# Patient Record
Sex: Female | Born: 1965 | Race: White | Hispanic: No | Marital: Married | Smoking: Never smoker
Health system: Southern US, Community
[De-identification: ages and names within clinical notes are randomized; demographics above are authoritative.]

## PROBLEM LIST (undated history)

## (undated) ENCOUNTER — Emergency Department: Payer: 59

## (undated) ENCOUNTER — Emergency Department: Admission: EM | Source: Home / Self Care

## (undated) DIAGNOSIS — IMO0002 Reserved for concepts with insufficient information to code with codable children: Secondary | ICD-10-CM

## (undated) DIAGNOSIS — A3790 Whooping cough, unspecified species without pneumonia: Secondary | ICD-10-CM

## (undated) DIAGNOSIS — C801 Malignant (primary) neoplasm, unspecified: Secondary | ICD-10-CM

## (undated) DIAGNOSIS — B009 Herpesviral infection, unspecified: Secondary | ICD-10-CM

## (undated) DIAGNOSIS — E079 Disorder of thyroid, unspecified: Secondary | ICD-10-CM

## (undated) DIAGNOSIS — F319 Bipolar disorder, unspecified: Secondary | ICD-10-CM

## (undated) DIAGNOSIS — N939 Abnormal uterine and vaginal bleeding, unspecified: Secondary | ICD-10-CM

## (undated) DIAGNOSIS — F32A Depression, unspecified: Secondary | ICD-10-CM

## (undated) DIAGNOSIS — F909 Attention-deficit hyperactivity disorder, unspecified type: Secondary | ICD-10-CM

## (undated) DIAGNOSIS — T7840XA Allergy, unspecified, initial encounter: Secondary | ICD-10-CM

## (undated) DIAGNOSIS — Z87440 Personal history of urinary (tract) infections: Secondary | ICD-10-CM

## (undated) DIAGNOSIS — F419 Anxiety disorder, unspecified: Secondary | ICD-10-CM

## (undated) DIAGNOSIS — F329 Major depressive disorder, single episode, unspecified: Secondary | ICD-10-CM

## (undated) DIAGNOSIS — R569 Unspecified convulsions: Secondary | ICD-10-CM

## (undated) HISTORY — PX: TUBAL LIGATION: SHX77

## (undated) HISTORY — DX: Herpesviral infection, unspecified: B00.9

## (undated) HISTORY — DX: Personal history of urinary (tract) infections: Z87.440

## (undated) HISTORY — DX: Disorder of thyroid, unspecified: E07.9

## (undated) HISTORY — PX: CERVICAL SPINE SURGERY: SHX589

## (undated) HISTORY — PX: TONSILLECTOMY: SUR1361

## (undated) HISTORY — DX: Bipolar disorder, unspecified: F31.9

## (undated) HISTORY — DX: Attention-deficit hyperactivity disorder, unspecified type: F90.9

## (undated) HISTORY — PX: ABDOMINAL HYSTERECTOMY: SHX81

## (undated) HISTORY — DX: Allergy, unspecified, initial encounter: T78.40XA

## (undated) HISTORY — DX: Abnormal uterine and vaginal bleeding, unspecified: N93.9

---

## 1998-12-13 ENCOUNTER — Emergency Department (HOSPITAL_COMMUNITY): Admission: EM | Admit: 1998-12-13 | Discharge: 1998-12-13 | Payer: Self-pay | Admitting: Emergency Medicine

## 1998-12-24 ENCOUNTER — Encounter: Admission: RE | Admit: 1998-12-24 | Discharge: 1999-03-24 | Payer: Self-pay | Admitting: Orthopedic Surgery

## 1999-03-01 ENCOUNTER — Emergency Department (HOSPITAL_COMMUNITY): Admission: EM | Admit: 1999-03-01 | Discharge: 1999-03-01 | Payer: Self-pay | Admitting: Emergency Medicine

## 2000-02-25 ENCOUNTER — Encounter: Admission: RE | Admit: 2000-02-25 | Discharge: 2000-03-30 | Payer: Self-pay | Admitting: Family Medicine

## 2000-03-01 ENCOUNTER — Emergency Department (HOSPITAL_COMMUNITY): Admission: EM | Admit: 2000-03-01 | Discharge: 2000-03-01 | Payer: Self-pay | Admitting: Emergency Medicine

## 2000-03-16 ENCOUNTER — Ambulatory Visit (HOSPITAL_COMMUNITY): Admission: RE | Admit: 2000-03-16 | Discharge: 2000-03-16 | Payer: Self-pay | Admitting: Neurosurgery

## 2000-03-16 ENCOUNTER — Encounter: Payer: Self-pay | Admitting: Neurosurgery

## 2000-03-25 ENCOUNTER — Encounter: Payer: Self-pay | Admitting: Emergency Medicine

## 2000-03-25 ENCOUNTER — Emergency Department (HOSPITAL_COMMUNITY): Admission: EM | Admit: 2000-03-25 | Discharge: 2000-03-25 | Payer: Self-pay | Admitting: Emergency Medicine

## 2000-04-15 ENCOUNTER — Encounter: Payer: Self-pay | Admitting: Neurosurgery

## 2000-04-16 ENCOUNTER — Inpatient Hospital Stay (HOSPITAL_COMMUNITY): Admission: RE | Admit: 2000-04-16 | Discharge: 2000-04-17 | Payer: Self-pay | Admitting: Neurosurgery

## 2000-04-16 ENCOUNTER — Encounter: Payer: Self-pay | Admitting: Neurosurgery

## 2000-05-12 ENCOUNTER — Encounter: Admission: RE | Admit: 2000-05-12 | Discharge: 2000-05-12 | Payer: Self-pay | Admitting: Neurosurgery

## 2000-05-12 ENCOUNTER — Encounter: Payer: Self-pay | Admitting: Neurosurgery

## 2000-06-24 ENCOUNTER — Encounter: Payer: Self-pay | Admitting: Neurosurgery

## 2000-06-24 ENCOUNTER — Encounter: Admission: RE | Admit: 2000-06-24 | Discharge: 2000-06-24 | Payer: Self-pay | Admitting: Neurosurgery

## 2001-05-05 ENCOUNTER — Encounter: Payer: Self-pay | Admitting: General Practice

## 2001-05-05 ENCOUNTER — Encounter: Admission: RE | Admit: 2001-05-05 | Discharge: 2001-05-05 | Payer: Self-pay | Admitting: General Practice

## 2001-05-06 ENCOUNTER — Encounter: Admission: RE | Admit: 2001-05-06 | Discharge: 2001-05-06 | Payer: Self-pay | Admitting: General Practice

## 2001-05-06 ENCOUNTER — Encounter: Payer: Self-pay | Admitting: General Practice

## 2001-06-05 ENCOUNTER — Emergency Department (HOSPITAL_COMMUNITY): Admission: EM | Admit: 2001-06-05 | Discharge: 2001-06-05 | Payer: Self-pay | Admitting: Emergency Medicine

## 2001-11-28 ENCOUNTER — Encounter: Admission: RE | Admit: 2001-11-28 | Discharge: 2001-11-28 | Payer: Self-pay | Admitting: Emergency Medicine

## 2001-11-28 ENCOUNTER — Encounter: Payer: Self-pay | Admitting: Emergency Medicine

## 2001-12-17 ENCOUNTER — Emergency Department (HOSPITAL_COMMUNITY): Admission: AC | Admit: 2001-12-17 | Discharge: 2001-12-17 | Payer: Self-pay

## 2001-12-17 ENCOUNTER — Encounter: Payer: Self-pay | Admitting: Emergency Medicine

## 2002-05-30 ENCOUNTER — Ambulatory Visit (HOSPITAL_COMMUNITY): Admission: RE | Admit: 2002-05-30 | Discharge: 2002-05-30 | Payer: Self-pay | Admitting: Obstetrics and Gynecology

## 2002-05-30 ENCOUNTER — Encounter: Payer: Self-pay | Admitting: Obstetrics and Gynecology

## 2002-05-31 ENCOUNTER — Inpatient Hospital Stay (HOSPITAL_COMMUNITY): Admission: AD | Admit: 2002-05-31 | Discharge: 2002-06-03 | Payer: Self-pay | Admitting: Obstetrics and Gynecology

## 2002-07-02 ENCOUNTER — Encounter (INDEPENDENT_AMBULATORY_CARE_PROVIDER_SITE_OTHER): Payer: Self-pay | Admitting: *Deleted

## 2002-07-02 ENCOUNTER — Inpatient Hospital Stay (HOSPITAL_COMMUNITY): Admission: AD | Admit: 2002-07-02 | Discharge: 2002-07-09 | Payer: Self-pay | Admitting: Obstetrics and Gynecology

## 2002-07-07 ENCOUNTER — Encounter: Payer: Self-pay | Admitting: Pediatrics

## 2002-07-10 ENCOUNTER — Encounter (HOSPITAL_COMMUNITY): Admission: RE | Admit: 2002-07-10 | Discharge: 2002-08-09 | Payer: Self-pay | Admitting: Obstetrics and Gynecology

## 2002-08-25 ENCOUNTER — Encounter: Payer: Self-pay | Admitting: Emergency Medicine

## 2002-08-25 ENCOUNTER — Encounter: Admission: RE | Admit: 2002-08-25 | Discharge: 2002-08-25 | Payer: Self-pay | Admitting: Emergency Medicine

## 2002-09-09 ENCOUNTER — Encounter: Admission: RE | Admit: 2002-09-09 | Discharge: 2002-10-09 | Payer: Self-pay | Admitting: Obstetrics and Gynecology

## 2002-09-24 ENCOUNTER — Emergency Department (HOSPITAL_COMMUNITY): Admission: EM | Admit: 2002-09-24 | Discharge: 2002-09-24 | Payer: Self-pay | Admitting: Emergency Medicine

## 2002-09-25 ENCOUNTER — Encounter: Admission: RE | Admit: 2002-09-25 | Discharge: 2002-11-14 | Payer: Self-pay | Admitting: Anesthesiology

## 2002-11-09 ENCOUNTER — Encounter: Admission: RE | Admit: 2002-11-09 | Discharge: 2002-12-09 | Payer: Self-pay | Admitting: Obstetrics and Gynecology

## 2002-11-14 ENCOUNTER — Encounter: Admission: RE | Admit: 2002-11-14 | Discharge: 2003-02-12 | Payer: Self-pay

## 2002-12-10 ENCOUNTER — Encounter: Admission: RE | Admit: 2002-12-10 | Discharge: 2003-01-09 | Payer: Self-pay | Admitting: Obstetrics and Gynecology

## 2003-02-08 ENCOUNTER — Encounter: Admission: RE | Admit: 2003-02-08 | Discharge: 2003-03-10 | Payer: Self-pay | Admitting: Obstetrics and Gynecology

## 2003-04-10 ENCOUNTER — Encounter: Admission: RE | Admit: 2003-04-10 | Discharge: 2003-05-10 | Payer: Self-pay | Admitting: Obstetrics and Gynecology

## 2003-07-19 ENCOUNTER — Encounter: Admission: RE | Admit: 2003-07-19 | Discharge: 2003-09-05 | Payer: Self-pay

## 2003-10-09 ENCOUNTER — Encounter: Admission: RE | Admit: 2003-10-09 | Discharge: 2003-10-09 | Payer: Self-pay | Admitting: Neurosurgery

## 2003-10-29 ENCOUNTER — Encounter: Admission: RE | Admit: 2003-10-29 | Discharge: 2003-10-29 | Payer: Self-pay | Admitting: Neurosurgery

## 2004-01-17 ENCOUNTER — Emergency Department (HOSPITAL_COMMUNITY): Admission: AD | Admit: 2004-01-17 | Discharge: 2004-01-17 | Payer: Self-pay | Admitting: Family Medicine

## 2004-02-12 ENCOUNTER — Encounter: Admission: RE | Admit: 2004-02-12 | Discharge: 2004-02-12 | Payer: Self-pay | Admitting: Neurosurgery

## 2004-03-23 ENCOUNTER — Emergency Department (HOSPITAL_COMMUNITY): Admission: EM | Admit: 2004-03-23 | Discharge: 2004-03-23 | Payer: Self-pay | Admitting: Internal Medicine

## 2004-03-31 ENCOUNTER — Ambulatory Visit (HOSPITAL_COMMUNITY): Admission: RE | Admit: 2004-03-31 | Discharge: 2004-03-31 | Payer: Self-pay | Admitting: Internal Medicine

## 2004-04-05 ENCOUNTER — Emergency Department (HOSPITAL_COMMUNITY): Admission: EM | Admit: 2004-04-05 | Discharge: 2004-04-05 | Payer: Self-pay | Admitting: Emergency Medicine

## 2004-04-23 ENCOUNTER — Inpatient Hospital Stay (HOSPITAL_COMMUNITY): Admission: RE | Admit: 2004-04-23 | Discharge: 2004-04-25 | Payer: Self-pay | Admitting: Neurosurgery

## 2004-06-05 ENCOUNTER — Encounter: Admission: RE | Admit: 2004-06-05 | Discharge: 2004-06-17 | Payer: Self-pay | Admitting: Neurosurgery

## 2004-06-24 ENCOUNTER — Emergency Department (HOSPITAL_COMMUNITY): Admission: EM | Admit: 2004-06-24 | Discharge: 2004-06-25 | Payer: Self-pay | Admitting: Emergency Medicine

## 2004-07-03 ENCOUNTER — Encounter: Admission: RE | Admit: 2004-07-03 | Discharge: 2004-09-03 | Payer: Self-pay | Admitting: Pediatrics

## 2004-09-09 ENCOUNTER — Ambulatory Visit (HOSPITAL_COMMUNITY): Admission: RE | Admit: 2004-09-09 | Discharge: 2004-09-09 | Payer: Self-pay | Admitting: Family Medicine

## 2004-12-02 ENCOUNTER — Ambulatory Visit: Payer: Self-pay | Admitting: Psychiatry

## 2004-12-02 ENCOUNTER — Inpatient Hospital Stay (HOSPITAL_COMMUNITY): Admission: EM | Admit: 2004-12-02 | Discharge: 2004-12-12 | Payer: Self-pay | Admitting: Psychiatry

## 2004-12-09 ENCOUNTER — Encounter: Payer: Self-pay | Admitting: Psychiatry

## 2004-12-23 ENCOUNTER — Inpatient Hospital Stay (HOSPITAL_COMMUNITY): Admission: EM | Admit: 2004-12-23 | Discharge: 2004-12-26 | Payer: Self-pay | Admitting: Emergency Medicine

## 2005-01-08 ENCOUNTER — Ambulatory Visit (HOSPITAL_COMMUNITY): Admission: RE | Admit: 2005-01-08 | Discharge: 2005-01-08 | Payer: Self-pay | Admitting: Family Medicine

## 2005-01-12 ENCOUNTER — Ambulatory Visit: Payer: Self-pay | Admitting: Psychiatry

## 2005-01-12 ENCOUNTER — Inpatient Hospital Stay (HOSPITAL_COMMUNITY): Admission: RE | Admit: 2005-01-12 | Discharge: 2005-01-21 | Payer: Self-pay | Admitting: Psychiatry

## 2005-05-29 ENCOUNTER — Inpatient Hospital Stay (HOSPITAL_COMMUNITY): Admission: RE | Admit: 2005-05-29 | Discharge: 2005-06-05 | Payer: Self-pay | Admitting: Psychiatry

## 2005-05-29 ENCOUNTER — Ambulatory Visit: Payer: Self-pay | Admitting: Psychiatry

## 2006-06-09 ENCOUNTER — Ambulatory Visit (HOSPITAL_COMMUNITY): Admission: RE | Admit: 2006-06-09 | Discharge: 2006-06-09 | Payer: Self-pay | Admitting: Neurosurgery

## 2006-08-17 ENCOUNTER — Inpatient Hospital Stay (HOSPITAL_COMMUNITY): Admission: RE | Admit: 2006-08-17 | Discharge: 2006-08-20 | Payer: Self-pay | Admitting: Neurosurgery

## 2006-12-21 ENCOUNTER — Ambulatory Visit (HOSPITAL_COMMUNITY): Admission: RE | Admit: 2006-12-21 | Discharge: 2006-12-21 | Payer: Self-pay | Admitting: Neurosurgery

## 2007-01-11 ENCOUNTER — Ambulatory Visit: Payer: Self-pay | Admitting: Gastroenterology

## 2007-02-25 ENCOUNTER — Encounter: Admission: RE | Admit: 2007-02-25 | Discharge: 2007-05-26 | Payer: Self-pay | Admitting: Radiology

## 2007-03-08 ENCOUNTER — Ambulatory Visit: Payer: Self-pay | Admitting: Physical Medicine & Rehabilitation

## 2007-03-23 ENCOUNTER — Emergency Department (HOSPITAL_COMMUNITY): Admission: EM | Admit: 2007-03-23 | Discharge: 2007-03-23 | Payer: Self-pay | Admitting: Emergency Medicine

## 2007-04-06 ENCOUNTER — Encounter: Admission: RE | Admit: 2007-04-06 | Discharge: 2007-07-05 | Payer: Self-pay | Admitting: Anesthesiology

## 2007-05-10 ENCOUNTER — Ambulatory Visit: Payer: Self-pay | Admitting: Anesthesiology

## 2007-05-10 ENCOUNTER — Other Ambulatory Visit: Admission: RE | Admit: 2007-05-10 | Discharge: 2007-05-10 | Payer: Self-pay | Admitting: Obstetrics and Gynecology

## 2007-05-11 ENCOUNTER — Ambulatory Visit (HOSPITAL_COMMUNITY): Admission: RE | Admit: 2007-05-11 | Discharge: 2007-05-11 | Payer: Self-pay | Admitting: Obstetrics and Gynecology

## 2007-05-27 ENCOUNTER — Observation Stay (HOSPITAL_COMMUNITY): Admission: EM | Admit: 2007-05-27 | Discharge: 2007-05-31 | Payer: Self-pay | Admitting: *Deleted

## 2007-06-06 ENCOUNTER — Emergency Department (HOSPITAL_COMMUNITY): Admission: EM | Admit: 2007-06-06 | Discharge: 2007-06-06 | Payer: Self-pay | Admitting: Emergency Medicine

## 2007-06-09 ENCOUNTER — Ambulatory Visit: Payer: Self-pay | Admitting: Gastroenterology

## 2007-06-13 ENCOUNTER — Encounter: Admission: RE | Admit: 2007-06-13 | Discharge: 2007-06-13 | Payer: Self-pay | Admitting: Family Medicine

## 2007-06-13 ENCOUNTER — Emergency Department (HOSPITAL_COMMUNITY): Admission: EM | Admit: 2007-06-13 | Discharge: 2007-06-13 | Payer: Self-pay | Admitting: Emergency Medicine

## 2007-06-29 ENCOUNTER — Encounter: Admission: RE | Admit: 2007-06-29 | Discharge: 2007-06-29 | Payer: Self-pay | Admitting: Obstetrics and Gynecology

## 2008-02-10 ENCOUNTER — Inpatient Hospital Stay (HOSPITAL_COMMUNITY): Admission: AD | Admit: 2008-02-10 | Discharge: 2008-02-17 | Payer: Self-pay | Admitting: Psychiatry

## 2008-02-10 ENCOUNTER — Ambulatory Visit: Payer: Self-pay | Admitting: Psychiatry

## 2008-12-13 ENCOUNTER — Encounter: Payer: Self-pay | Admitting: Gynecology

## 2008-12-13 ENCOUNTER — Encounter: Admission: RE | Admit: 2008-12-13 | Discharge: 2008-12-13 | Payer: Self-pay | Admitting: Family Medicine

## 2008-12-13 ENCOUNTER — Ambulatory Visit: Payer: Self-pay | Admitting: Gynecology

## 2008-12-13 ENCOUNTER — Other Ambulatory Visit: Admission: RE | Admit: 2008-12-13 | Discharge: 2008-12-13 | Payer: Self-pay | Admitting: Gynecology

## 2008-12-16 ENCOUNTER — Emergency Department (HOSPITAL_COMMUNITY): Admission: EM | Admit: 2008-12-16 | Discharge: 2008-12-16 | Payer: Self-pay | Admitting: Family Medicine

## 2008-12-19 ENCOUNTER — Encounter: Admission: RE | Admit: 2008-12-19 | Discharge: 2008-12-19 | Payer: Self-pay | Admitting: Family Medicine

## 2009-05-13 ENCOUNTER — Encounter: Payer: Self-pay | Admitting: Emergency Medicine

## 2009-05-13 ENCOUNTER — Emergency Department (HOSPITAL_COMMUNITY): Admission: EM | Admit: 2009-05-13 | Discharge: 2009-05-13 | Payer: Self-pay | Admitting: Emergency Medicine

## 2009-05-14 ENCOUNTER — Inpatient Hospital Stay (HOSPITAL_COMMUNITY): Admission: EM | Admit: 2009-05-14 | Discharge: 2009-05-15 | Payer: Self-pay | Admitting: Neurology

## 2009-07-12 ENCOUNTER — Encounter: Payer: Self-pay | Admitting: Critical Care Medicine

## 2009-07-12 ENCOUNTER — Emergency Department (HOSPITAL_COMMUNITY): Admission: EM | Admit: 2009-07-12 | Discharge: 2009-07-12 | Payer: Self-pay | Admitting: Emergency Medicine

## 2009-07-19 ENCOUNTER — Encounter: Admission: RE | Admit: 2009-07-19 | Discharge: 2009-07-19 | Payer: Self-pay | Admitting: Family Medicine

## 2009-07-24 ENCOUNTER — Telehealth: Payer: Self-pay | Admitting: Critical Care Medicine

## 2009-08-01 DIAGNOSIS — R569 Unspecified convulsions: Secondary | ICD-10-CM | POA: Insufficient documentation

## 2009-08-01 DIAGNOSIS — M949 Disorder of cartilage, unspecified: Secondary | ICD-10-CM

## 2009-08-01 DIAGNOSIS — F502 Bulimia nervosa: Secondary | ICD-10-CM | POA: Insufficient documentation

## 2009-08-01 DIAGNOSIS — M899 Disorder of bone, unspecified: Secondary | ICD-10-CM | POA: Insufficient documentation

## 2009-08-01 DIAGNOSIS — F319 Bipolar disorder, unspecified: Secondary | ICD-10-CM | POA: Insufficient documentation

## 2009-08-01 DIAGNOSIS — H9190 Unspecified hearing loss, unspecified ear: Secondary | ICD-10-CM | POA: Insufficient documentation

## 2009-08-01 DIAGNOSIS — F1020 Alcohol dependence, uncomplicated: Secondary | ICD-10-CM

## 2009-08-02 ENCOUNTER — Ambulatory Visit: Payer: Self-pay | Admitting: Critical Care Medicine

## 2009-08-02 DIAGNOSIS — R05 Cough: Secondary | ICD-10-CM

## 2009-08-02 DIAGNOSIS — IMO0002 Reserved for concepts with insufficient information to code with codable children: Secondary | ICD-10-CM | POA: Insufficient documentation

## 2009-08-02 DIAGNOSIS — K219 Gastro-esophageal reflux disease without esophagitis: Secondary | ICD-10-CM | POA: Insufficient documentation

## 2009-08-16 ENCOUNTER — Encounter: Payer: Self-pay | Admitting: Critical Care Medicine

## 2009-09-13 ENCOUNTER — Ambulatory Visit: Payer: Self-pay | Admitting: Critical Care Medicine

## 2009-09-18 ENCOUNTER — Encounter: Payer: Self-pay | Admitting: Critical Care Medicine

## 2009-09-20 ENCOUNTER — Emergency Department (HOSPITAL_COMMUNITY): Admission: EM | Admit: 2009-09-20 | Discharge: 2009-09-20 | Payer: Self-pay | Admitting: Emergency Medicine

## 2009-10-22 ENCOUNTER — Ambulatory Visit: Payer: Self-pay | Admitting: Critical Care Medicine

## 2009-10-22 DIAGNOSIS — J209 Acute bronchitis, unspecified: Secondary | ICD-10-CM

## 2010-05-15 ENCOUNTER — Emergency Department (HOSPITAL_COMMUNITY): Admission: EM | Admit: 2010-05-15 | Discharge: 2010-05-15 | Payer: Self-pay | Admitting: Emergency Medicine

## 2010-06-19 ENCOUNTER — Emergency Department (HOSPITAL_BASED_OUTPATIENT_CLINIC_OR_DEPARTMENT_OTHER): Admission: EM | Admit: 2010-06-19 | Discharge: 2010-06-19 | Payer: Self-pay | Admitting: Emergency Medicine

## 2010-06-19 ENCOUNTER — Ambulatory Visit: Payer: Self-pay | Admitting: Diagnostic Radiology

## 2010-07-02 ENCOUNTER — Encounter: Admission: RE | Admit: 2010-07-02 | Discharge: 2010-07-02 | Payer: Self-pay | Admitting: *Deleted

## 2010-11-16 ENCOUNTER — Encounter: Payer: Self-pay | Admitting: Family Medicine

## 2011-01-09 LAB — DIFFERENTIAL
Eosinophils Relative: 5 % (ref 0–5)
Lymphocytes Relative: 35 % (ref 12–46)
Lymphs Abs: 1.7 10*3/uL (ref 0.7–4.0)
Monocytes Relative: 6 % (ref 3–12)
Neutrophils Relative %: 53 % (ref 43–77)

## 2011-01-09 LAB — URINALYSIS, ROUTINE W REFLEX MICROSCOPIC
Glucose, UA: NEGATIVE mg/dL
Nitrite: NEGATIVE
Specific Gravity, Urine: 1.015 (ref 1.005–1.030)
pH: 7.5 (ref 5.0–8.0)

## 2011-01-09 LAB — BASIC METABOLIC PANEL
CO2: 29 mEq/L (ref 19–32)
Chloride: 104 mEq/L (ref 96–112)
Creatinine, Ser: 1.1 mg/dL (ref 0.4–1.2)
GFR calc Af Amer: >60 mL/min (ref >60)
Sodium: 139 mEq/L (ref 135–145)

## 2011-01-09 LAB — CBC
Hemoglobin: 12.7 g/dL (ref 12.0–15.0)
MCH: 34.1 pg — ABNORMAL HIGH (ref 26.0–34.0)
MCV: 96.6 fL (ref 78.0–100.0)
Platelets: 211 10*3/uL (ref 150–400)
RBC: 3.71 MIL/uL — ABNORMAL LOW (ref 3.87–5.11)
WBC: 4.9 10*3/uL (ref 4.0–10.5)

## 2011-01-09 LAB — PREGNANCY, URINE: Preg Test, Ur: NEGATIVE

## 2011-01-10 LAB — POCT I-STAT, CHEM 8
Chloride: 109 mEq/L (ref 96–112)
HCT: 35 % — ABNORMAL LOW (ref 36.0–46.0)
Hemoglobin: 11.9 g/dL — ABNORMAL LOW (ref 12.0–15.0)
Potassium: 3.6 mEq/L (ref 3.5–5.1)
Sodium: 141 mEq/L (ref 135–145)

## 2011-02-01 LAB — COMPREHENSIVE METABOLIC PANEL
ALT: 12 U/L (ref 0–35)
Albumin: 3.9 g/dL (ref 3.5–5.2)
Alkaline Phosphatase: 41 U/L (ref 39–117)
Chloride: 107 mEq/L (ref 96–112)
Glucose, Bld: 114 mg/dL — ABNORMAL HIGH (ref 70–99)
Potassium: 4.3 mEq/L (ref 3.5–5.1)
Sodium: 137 mEq/L (ref 135–145)
Total Protein: 6.3 g/dL (ref 6.0–8.3)

## 2011-02-01 LAB — URINALYSIS, ROUTINE W REFLEX MICROSCOPIC
Leukocytes, UA: NEGATIVE
Nitrite: NEGATIVE
Specific Gravity, Urine: 1.024 (ref 1.005–1.030)
Urobilinogen, UA: 0.2 mg/dL (ref 0.0–1.0)
pH: 7.5 (ref 5.0–8.0)

## 2011-02-01 LAB — CBC
Hemoglobin: 12.5 g/dL (ref 12.0–15.0)
RBC: 3.77 MIL/uL — ABNORMAL LOW (ref 3.87–5.11)
RDW: 13.1 % (ref 11.5–15.5)
WBC: 6.4 10*3/uL (ref 4.0–10.5)

## 2011-02-01 LAB — DIFFERENTIAL
Basophils Relative: 0 % (ref 0–1)
Eosinophils Absolute: 0 10*3/uL (ref 0.0–0.7)
Monocytes Absolute: 0.3 10*3/uL (ref 0.1–1.0)
Monocytes Relative: 4 % (ref 3–12)
Neutrophils Relative %: 82 % — ABNORMAL HIGH (ref 43–77)

## 2011-02-01 LAB — ETHANOL: Alcohol, Ethyl (B): <5 mg/dL (ref 0–10)

## 2011-02-01 LAB — URINE MICROSCOPIC-ADD ON

## 2011-02-01 LAB — RAPID URINE DRUG SCREEN, HOSP PERFORMED
Cocaine: NOT DETECTED
Tetrahydrocannabinol: NOT DETECTED

## 2011-02-10 LAB — POCT URINALYSIS DIP (DEVICE)
Ketones, ur: NEGATIVE mg/dL
Nitrite: NEGATIVE
Protein, ur: NEGATIVE mg/dL
pH: 6.5 (ref 5.0–8.0)

## 2011-03-10 NOTE — H&P (Signed)
NAME:  [name], [name] NO.:  [id number]   MEDICAL RECORD NO.:  [id number]          PATIENT TYPE:  AMB   LOCATION:  SDC                           FACILITY:  [facility]   PHYSICIAN:  James A. Ashley Royalty, M.D.DATE OF BIRTH:  November 08, 1965   DATE OF ADMISSION:  DATE OF DISCHARGE:                              HISTORY & PHYSICAL   This is a 45 year old gravida 5, para 4, AB 1, who states a desire for  an attempt at permanent surgical sterilization.   MEDICATIONS:  Depakote, Lamictal.   PAST MEDICAL HISTORY:  1. Degenerative disk disease.  2. Bipolar disorder.   SURGICAL:  Three surgeries on her neck, cesarean section 2003,  tonsillectomy.   ALLERGIES:  NO KNOWN DRUG ALLERGIES.   FAMILY HISTORY:  Positive for cervical cancer.   SOCIAL HISTORY:  The patient has a history of alcohol abuse but is  currently in remission.  Denies use of tobacco.   REVIEW OF SYSTEMS:  Noncontributory.   PHYSICAL EXAMINATION:  GENERAL:  Well-developed, well-nourished,  pleasant black female, no acute distress.  VITAL SIGNS:  Afebrile, vital signs stable.  CHEST:  Lungs are clear.  CARDIAC:  Regular rate and rhythm.  ABDOMEN:  Soft and nontender.  PELVIC:  Please see most recent office evaluation.  GENITALIA:  Within normal limits.  Vagina and cervix are without gross  lesions.  Bimanual examination reveals uterus to be normal size, shape  and contour and no adnexal masses are palpable.   IMPRESSION:  Desire for attempt at permanent surgical sterilization.   PLAN:  Laparoscopic bilateral tubal sterilization procedure.  Risks,  benefits, complications and alternatives were discussed with the  patient.  Permanency and failure of various techniques including but not  limited to bipolar cautery, Falope ring, mini-laparotomy with partial  salpingectomy discussed, etc.  Questions invited and answered.      James A. Ashley Royalty, M.D.  Electronically Signed     JAM/MEDQ  D:  05/11/2007  T:   05/11/2007  Job:  [id number]

## 2011-03-10 NOTE — Op Note (Signed)
NAME:  [name], [name] NO.:  [id number]   MEDICAL RECORD NO.:  [id number]          PATIENT TYPE:  AMB   LOCATION:  SDC                           FACILITY:  [facility]   PHYSICIAN:  James A. Ashley Royalty, M.D.DATE OF BIRTH:  [birth year]   DATE OF PROCEDURE:  05/11/2007  DATE OF DISCHARGE:                               OPERATIVE REPORT   PREOPERATIVE DIAGNOSIS:  Desire for attempted permanent surgical  sterilization.   POSTOPERATIVE DIAGNOSIS:  1. Desire for attempted permanent surgical sterilization.  2. Left ovarian cyst versus cystic follicle.   PROCEDURE:  Laparoscopic tubal sterilization procedure (Falope rings).   SURGEON:  Sylvester Harder, M.D.   ANESTHESIA:  General   ESTIMATED BLOOD LOSS:  Less than 10 mL.   COMPLICATIONS:  None.   PACKS AND DRAINS:  None   PROCEDURE:  The patient was taken to the operating room, placed in the  dorsal supine position.  After general anesthetic was administered she  was placed in the lithotomy position, prepped and draped in usual manner  for abdominal and vaginal surgery.  Posterior weighted retractor was  placed per vagina and the anterior lip of the cervix was grasped with  single-tooth tenaculum.  Jarcho uterine manipulator was placed per  cervix and held in place with a tenaculum.  Bladder was drained with a  red rubber catheter.  Next a 1.2 cm internal umbilical incision was made  in the longitudinal plane.  Veress needle was inserted into the  abdominal cavity.  Its location was verified by instillation of saline  in hanging drop techniques.  Approximately 3 liters CO2 were instilled  into the abdominal cavity to create pneumoperitoneum.  Next the size  10/11 disposable laparoscopic trocar was placed in the abdominal cavity.  Its location was verified by placement laparoscope.  There is no  evidence of any trauma.  Next an 8 mm suprapubic trocar to accommodate  the Falope rings was placed in the abdominal cavity  suprapubically  slightly deviated to the left side to avoid the vasculature noted at  transillumination.  Direct visualization technique was employed for  placement.  The pelvis was thoroughly inspected.  The uterus was normal  size, shape and contour without evidence of any fibroids or  endometriosis.  Right fallopian tube as well as left fallopian tube were  normal size, shape, contour length with luxuriant fimbriae.  The right  ovary was normal size, shape and contour without evidence of any cysts  or endometriosis.  The left ovary was normal in appearance save for  approximately 2 cm apparent ovarian cyst versus cystic follicle.  There  were no surface excrescences.  Remainder of the peritoneal surfaces were  smooth and glistening.   Attention was then turned to the tubal sterilization procedure.  The  right fallopian tube was grasped and distal isthmic to proximal  ampullary portion.  A Falope ring was applied without difficulty.  An  excellent knuckle of tube was noted to be contained within the ring.  Excellent blanching of tissue was noted.  Next the left fallopian tube  was grasped and traced to its fimbriated end.  An avascular area in the  distal isthmic to proximal ampullary portion was chosen for ring  placement.  A Falope ring was applied without difficulty.  An excellent  knuckle of tube was noted to be contained within the ring.  Excellent  blanching of tissue was noted.   At this point the patient was felt to have benefited maximally from the  surgical procedure.  The abdominal instruments were removed.  Pneumoperitoneum evacuated.  Fascial defects were closed with 0 Vicryl  in an interrupted fashion.  The skin was closed with 3-0 Monocryl in  subcuticular fashion.  Approximately 13 mL of 0.5% Marcaine with  1:200,000 epinephrine were instilled into the abdominal incisions to aid  in postoperative analgesia.   The vaginal instruments were removed.  Hemostasis noted  and the  procedure terminated.   The patient tolerated procedure extremely well and was returned to the  recovery room in good condition.      James A. Ashley Royalty, M.D.  Electronically Signed     JAM/MEDQ  D:  05/11/2007  T:  05/11/2007  Job:  [id number]

## 2011-03-10 NOTE — H&P (Signed)
[name], [name] NO.:  [id number]   MEDICAL RECORD NO.:  [id number]          PATIENT TYPE:  OBV   LOCATION:  4731                         FACILITY:  [facility]   PHYSICIAN:  Hollice Espy, M.D.DATE OF BIRTH:  1966/05/31   DATE OF ADMISSION:  05/26/2007  DATE OF DISCHARGE:                              HISTORY & PHYSICAL   PRIMARY CARE PHYSICIAN:  Lucita Ferrara, M.D.   CHIEF COMPLAINT:  Chest discomfort.   HISTORY OF PRESENT ILLNESS:  The patient is a 45 year old white female  with past medical history of degenerative joint disease and bipolar  disorder who presents to the  emergency room after an episode of chest  discomfort.  She has had no previous episode.  Was doing well when all  of a sudden early this morning, she started having what she described as  chest discomfort.  This was described as sudden onset, without any kind  of associated activity.  Described initially as a sharp, stabbing pain  in the lateral side of her left breast and then some mild pressure to  right above it.  She also had some associated shortness of breath, some  lightheadedness as well as a headache and some tingling over her  forehead and underneath her chin as well as some tingling down her left  arm.  Initially her symptoms were mild but then continued to persist.  She was given nitroglycerin and aspirin which she said improved her  symptoms, especially the tingling sensation.  She became obviously  concerned and called the paramedics who had the patient transported.  In  the emergency room, she had a chest x-ray done which was reportedly  unremarkable.  She had blood work done including cardiac markers which  were unremarkable and an EKG which showed normal sinus rhythm.  The  patient was given systemically aspirin, Protonix, morphine, and  nitroglycerin patch. She says the only residual symptoms are some left  arm numbness which is also like a left hand cramping.  Currently  the  patient is doing well.   She denies any headaches or vision changes. She has reported over the  last few days some dysphagia which she says whenever she tries to take  any of her pills, this caused a choking sensation.  She denies any  current chest pain.  No palpitations.  No shortness of breath, wheezing,  coughing.  No abdominal pain.  No hematuria, dysuria, constipation,  diarrhea, focal extremity weakness or pain other than, of course, the  left arm numbness and cramping.  Review of systems otherwise negative.   PAST MEDICAL HISTORY:  1. Degenerative joint disease.  2. Bipolar disorder.   MEDICATIONS:  Lamictal, Depakote, Flexeril, tramadol, and Wellbutrin.   ALLERGIES:  She has no known drug allergies.   SOCIAL HISTORY:  She denies any tobacco, alcohol or drug use.   FAMILY HISTORY:  Negative for any CAD.   PHYSICAL EXAMINATION:  VITAL SIGNS:  On admission, temperature 98.6,  heart rate initially 110, now down to 91.  Blood pressure 120/70, now  down to 97/62.  Respirations 22.  Oxygen saturation 96% on  room air.  GENERAL:  The patient is alert and oriented x3, in no apparent distress.  HEENT:  Normocephalic, atraumatic.  Mucous membranes are moist.  She has  no carotid bruits.  HEART:  Regular rate and rhythm.  S1 and S2.  LUNGS:  Clear to auscultation bilaterally.  ABDOMEN:  Soft, nontender, nondistended.  Positive bowel sounds.  EXTREMITIES:  No clubbing, cyanosis or edema.   LABORATORY DATA:  White count 5.2, hemoglobin 12.6, hematocrit 37.3, MCV  94, platelet count 183,000.  Sodium 138, potassium 4.2, chloride 106,  bicarb 27, BUN 14, creatinine 0.9, glucose 90, LFTs unremarkable.  D-  dimer normal. Coags normal.  Cardiac markers:  CPK 25.5, MB less than 1,  troponin I less than 0.05.  EKG and chest x-ray are per HPI.   ASSESSMENT/PLAN:  1. Chest discomfort.  It is possible this may not be cardiac in nature      given the atypical symptoms.  Would plan to  check two more sets of      cardiac enzymes and also check a swallow evaluation.  If her      symptoms are negative, could possibly set up for an outpatient      stress test.  2. Dysphagia.  Speech therapy to check for swallow evaluation.  3. Bipolar disorder.  Continue Depakote and Lamictal.      Hollice Espy, M.D.  Electronically Signed     SKK/MEDQ  D:  05/27/2007  T:  05/27/2007  Job:  [id number]
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
## 2011-03-10 NOTE — Discharge Summary (Signed)
NAME:  [name], [name] NO.:  [id number]   MEDICAL RECORD NO.:  [id number]          PATIENT TYPE:  EMS   LOCATION:  ED                           FACILITY:  [facility]   PHYSICIAN:  Marlan Palau, M.D.  DATE OF BIRTH:  [birth year]   DATE OF ADMISSION:  05/13/2009  DATE OF DISCHARGE:  05/14/2009                               DISCHARGE SUMMARY   ADMISSION DIAGNOSES:  1. History of seizure disorder with recent recurrence.  2. Bipolar disorder.  3. History of Xanax abuse and suicidal ideation.   DISCHARGE DIAGNOSES:  1. History of seizure disorder with recent recurrence.  2. Headache.  3. Bipolar disorder.   PROCEDURES DONE THIS ADMISSION:  1. CT of the head.  2. MRI of the brain.  3. EEG study.   COMPLICATION OF ABOVE PROCEDURES:  None.   HISTORY OF PRESENT ILLNESS:  [name] is a 45 year old right-  handed white female born on 1966-03-22, with a history of seizures  dating back to 2005.  This patient had some seizures as well in 2008,  but was not treated with medications.  The patient recently has been  placed on Wellbutrin rapidly going up to 150 mg three times daily and  was placed on Lamictal done initially at 25 mg daily and then going to  50 mg a day.  This patient was being treated for bipolar disorder.  The  patient however had 2 seizure events that were generalized that were  noted prior to coming into the emergency room and had a third seizure  after a Dilantin load.  The patient was brought in for observation and  treatment of the seizures therefore.  The patient did have some tongue  biting with the seizures.   PAST MEDICAL HISTORY:  Significant for,  1. History of seizure disorder with recent recurrence.  2. Bipolar disorder.  3. History of Xanax abuse and suicidal ideation.  4. Esophageal strictures.  5. Degenerative arthritis.  6. Bilateral tubal ligation.  7. C-section in the past.  8. Cervical spine surgery on 3 occasions.  9.  History of bulimia.   MEDICATIONS PRIOR TO ADMISSION:  1. Wellbutrin 150 mg three daily.  2. Lamictal 50 mg daily.   The patient does not smoke or drink.   Has no known allergies.   Please refer to history and physical dictation summary for social  history, family history, review of systems, and physical examination.   Laboratory values are notable for a white count of 6.4, hemoglobin 12.5,  hematocrit of 36.3, MCV of 96.4, platelets of 186.  Sodium 137,  potassium 4.3, chloride of 107, CO2 of 25, glucose 114, BUN of 9,  creatinine 0.83, alk phosphatase of 41, SGOT of 19, SGPT of 12, total  protein 6.3, albumin of 3.9, calcium of 9.6.  Urine drug screen was  positive for opiates.  Urinalysis reveals specific gravity of 1.024, pH  of 7.5, 0-2 white cells, otherwise unremarkable.  Alcohol level was less  than 5.   CT of the head was unremarkable.   HOSPITAL COURSE:  This patient has done well during  the course of  hospitalization.  The patient was taken off her Wellbutrin on admission.  The patient was given Dilantin load in the emergency room and was  maintained on 300 mg daily.  The Lamictal was increased to 50 mg twice  daily and plans are to increase this by 50 mg every 2 weeks until she  gets to 100 mg twice daily.  At that point, Dilantin will be slowly  tapered off and Lamictal may be continued to be increased.  Lamictal is  used for treatment of seizures as well as bipolar disorder.  The patient  will not go back on Wellbutrin as it has a significant tendency to lower  seizure thresholds.  The patient has been complaining of headache  associated with some neck stiffness consistent with cervicogenic  headache.  The patient has undergone MRI scan of the brain that is  unremarkable.  EEG study shows a drowsy record, but no epileptiform  discharges.  The patient will follow up in 2 months with Dr. Sharene Skeans.  The patient is not operating a motor vehicle, but is not to drive for  at  least 3 months following this admission.  The patient claims she has not  driven a car in several years.  The patient is not working.   DISCHARGE MEDICATIONS:  1. Lamictal at 50 mg twice daily, go up to 50 mg in the morning and      100 mg in the evening after 2 weeks and then after another 2 weeks,      the patient will go to 100 mg twice daily.  2. Dilantin 300 mg at night.      Marlan Palau, M.D.  Electronically Signed     CKW/MEDQ  D:  05/15/2009  T:  05/15/2009  Job:  [id number]   cc:   [facility]

## 2011-03-10 NOTE — Discharge Summary (Signed)
[name], RITTER           ACCOUNT NO.:  [id number]   MEDICAL RECORD NO.:  [id number]          PATIENT TYPE:  OBV   LOCATION:  4731                         FACILITY:  [facility]   PHYSICIAN:  Kela Millin, M.D.DATE OF BIRTH:  [birth year]   DATE OF ADMISSION:  05/26/2007  DATE OF DISCHARGE:  05/31/2007                               DISCHARGE SUMMARY   CONTINUATION   BRIEF HISTORY:  The patient is a 45 year old white female with the above-  listed medical problems who presented with complaints of chest  discomfort.  She reported that she had been doing well until the morning  of presentation when she had sudden onset of chest pain not associated  with any activity.  She described the pain as sharp, stabbing, and in  the lateral side of her left breast with mild pressure to right above  it.  She admitted to some shortness of breath with lightheadedness,  headache, and some tingling over her left forehead, underneath her chin,  and some tingling down her left arm.  She was given nitroglycerin and  aspirin which she reported improved her symptoms.  In the ER, she had a  chest x-ray done, which was unremarkable, and an EKG showed normal sinus  rhythm.  She was given aspirin, Protonix, morphine, and then put on the  nitroglycerin patch.  The patient denied visual changes. She reported  that she had some dysphagia, mostly with taking her pills which caused a  choking sensation.  She denied focal weakness.   Please see the dictated admission History and Physical per Dr. Rito Ehrlich  for full details of the admission physical exam as well as the  laboratory data.   HOSPITAL COURSE:  #1.  DYSPHAGIA:  Early distal esophageal stricture per EGD.  Upon  admission, the patient was kept n.p.o. and hydrated with IV fluids. She  also was placed on a proton pump inhibitor.  She was subsequently  started on clear liquids, and gastroenterology was consulted, and  [facility] saw the patient.   Initially she had an esophagram done with  results as stated above.  An EGD was subsequently done, and the results  are stated above.  As noted, dilatation was done.  The patient reported  that her swallowing improved following this procedure.  GI advanced her  diet, and she has been tolerating it better.  The patient subsequently  began also complaining of oropharyngeal difficulty with swallowing, and  GI recommended an ENT consult.  Dr. Ezzard Standing, with ENT, was consulted, and  I discussed the patient with him, and he recommended that a modified  barium swallow be done.  This was done, and the results are stated  above. Following this study, speech therapy recommended to continue her  regular diet.  Dr. Ezzard Standing also indicated that he would not need to see  the patient in the hospital but that she is to make an appointment to  follow up with him outpatient.  The patient is tolerating a p.o. diet  and tolerating her pills at this time and will be discharged to follow  up outpatient.   #2.  CHEST PAIN:  Upon admission, the patient had serial cardiac enzymes  done, and was placed on Protonix as noted and also was given aspirin and  nitroglycerin in the ER.  Serial cardiac enzymes were negative.  The  impression is that the chest pain was likely GI related secondary to #1.  She is to follow up with her primary care physician.   #3.  HISTORY OF BIPOLAR DISORDER:  The patient was maintained on her  outpatient medications during her hospital stay.   #4.  HISTORY OF BULIMIA:  Patient to follow up with GI as scheduled upon  discharge.   #5.  HISTORY OF DEGENERATIVE JOINT DISEASE:  The patient is to follow up  at the pain clinic.   DISCHARGE MEDICATIONS:  1. Prilosec 40 mg p.o. daily.  2. Patient to continue Flexeril, Lamictal, Tramadol, Depakote,      Wellbutrin, and Ambien as previously.   FOLLOWUP:  1. Dr. Flonnie Overman in 1 week, patient to call for appointment.  2. Dr. Ezzard Standing, ENT specialist.  Patient to call for appointment upon      discharge.  3. Dr. Jarold Motto, gastroenterologist, as scheduled.   CONDITION ON DISCHARGE:  Improved and stable.      Kela Millin, M.D.  Electronically Signed     ACV/MEDQ  D:  05/31/2007  T:  05/31/2007  Job:  [id number]   cc:   Lucita Ferrara, MD  Kristine Garbe. Ezzard Standing, M.D.  Vania Rea. Jarold Motto, MD, Caleen Essex, FAGA

## 2011-03-10 NOTE — Procedures (Signed)
REQUESTING PHYSICIAN:  C. Lesia Sago, MD   ATTENDING PHYSICIAN:  Marlan Palau, MD   CLINICAL HISTORY:  A 45 year old woman with history of bipolar disorder,  Xanax abuse, and recent Wellbutrin use, with a single seizure in 2005,  now admitted for seizures.  EEG is performed evaluation for seizures.   DESCRIPTION:  The dominant rhythm of this tracing is seen intermittently  in full wake and is a moderate amplitude alpha rhythm of 10-11 Hz which  predominates posteriorly, appears without abnormal asymmetry, and  attenuates with eye-opening and closing.  Throughout the recording,  intermittent spells of drowsiness appear as evidenced by fragmentation  of slowing in the background and appearance of a diffuse low-amplitude  theta state.  As drowsiness progresses, higher amplitude frontal  dominant 4-6 Hz theta waves are seen.  Late in the recording, stage II  sleep is documented as evidenced by the appearance of high-amplitude  vertex waves and some K complexes.  No definite focal abnormalities or  epileptiform discharges are seen.  Photic stimulation produced symmetric  driver responses.  Hyperventilation produced an increase in the high-  amplitude frontal theta.  Single channel devoted to EKG revealed sinus  rhythm throughout with rate of approximately 72 beats minute.   CONCLUSIONS:  Mildly abnormal study due to the presence of intermittent  diffuse slowing with the background rhythms, findings suggestive of  drowsiness, and/or mildly encephalopathic state.  No focal slowing is  noted and no epileptiform discharges are seen.      Michael L. Thad Ranger, M.D.  Electronically Signed     GNF:AOZH  D:  05/14/2009 11:48:48  T:  05/15/2009 05:36:19  Job #:  [id number]

## 2011-03-10 NOTE — H&P (Signed)
NAME:  [name], [name] NO.:  [id number]   MEDICAL RECORD NO.:  [id number]          PATIENT TYPE:  INP   LOCATION:  3018                         FACILITY:  [facility]   PHYSICIAN:  Marlan Palau, M.D.  DATE OF BIRTH:  1965-12-01   DATE OF ADMISSION:  05/13/2009  DATE OF DISCHARGE:                              HISTORY & PHYSICAL   HISTORY OF PRESENT ILLNESS:  [name] is a 45 year old right-  handed white female, born on 1966/10/10, with a history of seizures  dating back to 2005.  This patient had some seizures as well in 2008,  but has not been treated with medications.  The patient apparently was  seen by Dr. Sharene Skeans at that time and underwent an MRI and an EEG study.  The patient has recently been placed on Wellbutrin 150 mg 3 times a day  and is on Lamictal 50 mg daily.  Both these medications were started 3  weeks ago for bipolar disorder.  The patient has a history of Xanax  overuse and suicidal ideation and is actively being followed through  Psychiatry.  The patient has had 2 seizures before coming to the  emergency room and a third seizure after Dilantin load today.  Neurology  was called for further evaluation.  The patient had some tongue biting  with the seizures.  Seizures were generalized in nature.  The patient  reports no focal numbness or weakness of face, arms, or legs, but did  note a headache yesterday and today.  The patient is being admitted for  brief evaluation and management of seizures.   PAST MEDICAL HISTORY:  Significant for:  1. History of seizure disorder with recent recurrence.  2. Bipolar disorders.  3. History of Xanax abuse, suicidal ideation.  4. Esophageal strictures.  5. Degenerative arthritis.  6. Bilateral tubal ligation.  7. C section in the past.  8. Cervical spine surgery on 3 occasions.  9. Bulimia.   MEDICATIONS:  1. Wellbutrin 150 mg 3 times daily.  2. Lamictal at 50 mg daily.   The patient does not  smoke or drink.  Has no known allergies.   SOCIAL HISTORY:  The patient is married, lives in the [place] area.  She is not working and has 4 children who are alive and  well.   FAMILY MEDICAL HISTORY:  Notable for bronchiectasis in the mother with  MRSA infection.  Father is alive and well.  The patient has 1 brother  and 1 sister, both are alive and well.  No family history of seizures as  noted.   REVIEW OF SYSTEMS:  Notable for no recent fevers or chills.  The patient  does note headache yesterday and today.  Denies any visual field  changes, swallowing problems, or neck pain.  The patient denies any  shortness of breath, chest pains, or abdominal pains.  She did have some  numbness in the right leg, right arm within the last couple of weeks.  Notes some imbalance problems.   PHYSICAL EXAMINATION:  VITAL SIGNS:  Blood pressure is 106/66, heart  rate is  96, respiratory rate 18, and temperature afebrile.  GENERAL:  This patient is a fairly well-developed white female who is  alert and cooperative at the time of examination.  HEENT:  Head is atraumatic.  Eyes, pupils are equal, round, and reactive  to light.  Disks are sofa and flat bilaterally.  NECK:  Supple.  No carotid bruits noted.  RESPIRATORY:  Clear.  CARDIOVASCULAR:  Regular rate and rhythm.  No obvious murmurs or rubs  noted.  EXTREMITIES:  Without significant edema.  ABDOMEN:  Positive bowel sounds.  No organomegaly or tenderness is  noted.  NEUROLOGIC:  Cranial nerves as above.  Facial symmetry is present.  The  patient has good sensation of face to pinprick and soft touch  bilaterally.  She has good strength of facial muscles, muscle of head  turning and shoulder shrug bilaterally.  Speech is well enunciated, not  aphasic.  Motor testing reveals good strength in all fours.  Good  symmetric motor tone is noted throughout.  The patient has good finger-  nose-finger and heel-to-shin.  Gait was not  tested.  Deep tendon  reflexes symmetric and normal toes downgoing bilaterally.   LABORATORY VALUES:  Notable for white count of 6.4, hemoglobin of 12.5,  hematocrit of 36.3, MCV of 96.4, and platelets of 186.  Sodium of 137,  potassium of 4.3, chloride of 107, CO2 of 25, glucose of 114, BUN of 9,  creatinine 0.83, calcium 9.6, total protein is 6.3, albumin 3.9, AST of  19, ALT of 12.  Alcohol level less than 5.  Urine drug screen is  negative with the exception of opiates.  Urinalysis reveals specific  gravity 1.024, pH of 7.5, otherwise unremarkable.  CT of the head shows  no acute changes.  CT of the cervical spine was also done.   IMPRESSION:  1. History of seizure disorder with recent recurrence.  2. Bipolar disorder.   This patient will be admitted for brief evaluation and management and  procedures, check an MRI of the brain, and get an EEG study.  The  patient has been loaded with Dilantin.  We will continue Dilantin while  we get the Lamictal dose up to a more therapeutic level.  The patient  will be taken off Wellbutrin which was recently started.  We will follow  patient's clinical course while in-house.  The patient will have seizure  precautions.      Marlan Palau, M.D.  Electronically Signed     CKW/MEDQ  D:  05/13/2009  T:  05/14/2009  Job:  [id number]   cc:   [facility]
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
## 2011-03-10 NOTE — Procedures (Signed)
NAME:  BRETT, SOZA NO.:  [id number]   MEDICAL RECORD NO.:  [id number]          PATIENT TYPE:  REC   LOCATION:  OREH                         FACILITY:  [facility]   PHYSICIAN:  Celene Kras, MD        DATE OF BIRTH:  [birth year]   DATE OF PROCEDURE:  05/10/2007  DATE OF DISCHARGE:                               OPERATIVE REPORT   PATIENT:  [name].   DATE OF BIRTH:  08-30-1966   SURGEON:  Jewel Baize. Stevphen Rochester, M.D.   [facility]ailene [facility]breun [facility] to  evaluate her health and history form, 14 point review of systems.   1. She is an individual who is known to me, and states she has really      cleaned things up.  She has quit drinking, she wants to be as      engaging and as cooperative as possible.  She has been seen by Dr.      Wynn Banker.  Dr. Abigail Miyamoto sent her over here for further evaluation,      complaining of cervical pain.  Bilateral in nature above and below      surgical fixation site, with added biomechanical stress.  Referral      pattern consistent with suprascapular-levator scapular pain,      mechanical in nature.  She does not have any specific radicular      myelopathy component.  It is reasonable to inject cervical facet      medial branch at the intervention site, cervical spine, facet to      minimize escalation of controlled substances and improve function      and quality of life.  2. I am realistic with her, it is 25% to 50% if we can improve her      range of motion and lessen myofascial pain, I think this is      important.  Another rationale is to minimize escalation of      controlled substances, improve function and quality of life,      consider RF with positive predictive experience.   Maintain contact with our rehabilitation colleagues.   Objectively diffuse paracervical myofascial discomfort with positive  cervical facetal compression test right and left.  Suboccipital  compression test  positive; range of motion impaired secondary to pain.  Her pain in the paralumbar position is mostly  myofascial and  mechanical; nothing new neurologically.   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   PLAN:  Cervical facet medial branch inject intervention, C3, C4, C5, C6  and C7 with contributory innervation addressed.  Under local anesthetic,  and she is consented.  Predicate further intervention based on need and  overall response.  Will see her back in a few weeks.  She will assess  within context of activities of daily living and she is consented.   PROCEDURE:  The patient taken to the fluoroscopy suite and placed in the  supine position.  The neck was prepped, draped in usual  fashion.  Using  a 25-gauge needle, I advanced to the cervical facet at the medial branch  of C3, C4, C5, C6, and C7, with contributory innervation addressed.  Right and left side under local anesthetic, independent needle access  points confirmed placement.  I then injected 0.5 mL of lidocaine 1% MPF  at each level, a total of 40 mg Aristocort in divided dose.   Tolerated procedure well.  No complications from our procedure.  Appropriate recovery.  Discharge instructions given.  Will see her in  follow-up.  No barrier to communication.           ______________________________  Celene Kras, MD     HH/MEDQ  D:  05/10/2007 09:49:39  T:  05/10/2007 23:39:31  Job:  [id number]

## 2011-03-10 NOTE — Discharge Summary (Signed)
NAMEPAUL, [name]           ACCOUNT NO.:  [id number]   MEDICAL RECORD NO.:  [id number]          PATIENT TYPE:  OBV   LOCATION:  4731                         FACILITY:  [facility]   PHYSICIAN:  Kela Millin, M.D.DATE OF BIRTH:  08-15-1966   DATE OF ADMISSION:  05/26/2007  DATE OF DISCHARGE:  05/31/2007                               DISCHARGE SUMMARY   DISCHARGE DIAGNOSES:  1. Dysphagia, early distal esophageal stricture per      esophagogastroduodenoscopy.  2. Chest pain, ruled out for myocardial infarction by cardiac enzymes,      likely secondary to gastrointestinal etiology as above.  3. History of bulimia.  4. Bipolar disorder.  5. History of degenerative joint disease.   PROCEDURES AND STUDIES:  1. Esophagogram:  Nonspecific esophageal motility disorder.  2. Esophagogastroduodenoscopy on May 29, 2007:  Early distal      esophageal stricture, dilator passed x1.  3. Modified barium swallow:  Trace vallecula residuals noted with      solids only, which clear with intermittent multiple swallows.  No      penetration or aspiration.   CONSULTATIONS:  Gastroenterology, [place] Molly Maduro D. Arlyce Dice, MD).   HISTORY:  Dictation ended at this point.      Kela Millin, M.D.  Electronically Signed     ACV/MEDQ  D:  05/31/2007  T:  05/31/2007  Job:  [id number]

## 2011-03-13 NOTE — Assessment & Plan Note (Signed)
[facility]                         GASTROENTEROLOGY OFFICE NOTE   NAME:[name], [name]                  MRN:          [id number]  DATE:01/11/2007                            DOB:          1966/02/24    NEW PATIENT EVALUATION:  [name] is a 45 year old white female  currently unemployed who is battling with chronic bulimia.  She goes to  a bulimia support group, but still frequently causes herself to vomit,  and, as result has burning substernal chest pain, coughing, a globus  sensation in her throat, and chronic dry non-productive cough.   In addition to her bulimia, she has chronic discomfort in her neck  related to previous degenerative spine disease and is actually under the  care of Dr. Stevphen Rochester and has had local nerve blocks to her neck.  She  seems to be doing well on Percocet which is apparently dispensed by her  husband.  She additionally has a history of alcohol abuse and  alcoholism, but has been in recovery for 2 years, and apparently attends  AA meetings several times a week.   The patient has not been on antacids or PPIs.  She says that at times  when she vomits she has had had some coffee ground bloody-looking  material in her emesis.  She has no dysphagia, odynophagia or any  hepatobiliary complaints.  She was previously evaluated by Dr.  Abbey Chatters and Dr. Luther Parody from a GI standpoint in August of 2003 when  she was admitted with nausea, vomiting, abdominal pain and some abnormal  liver function tests.  All of these resolved spontaneously, and were  felt to be secondary to alcohol abuse.  She denies any history of  salicylate or NSAID abuse.   PAST MEDICAL HISTORY:  Remarkable for numerous psychiatric problems with  rather severe depression in the past.  She currently has mild  depression, but denies hallucinations, delusions or suicidal thoughts.  She is not on antidepressants.  She does have chronic anxiety  syndrome  and chronic headaches.  She had neck surgery in 2007 and had cervical  fusions by Dr. Jeral Fruit.  She did not have previous abdominal surgery.   FAMILY HISTORY:  Remarkable for cervical cancer in her mother and  several family members.  Her father was an alcoholic.  Otherwise, there  are no known gastrointestinal problems in her family.   SOCIAL HISTORY:  She is married, lives with her husband and has 4  children.  She has a 12th grade education.  She currently is unemployed.  She currently is a non-smoker or using ethanol, and apparently has been  sober for over a year.   REVIEW OF SYSTEMS:  Remarkable for a dry cough, chronic insomnia,  chronic neck and back pain, chronic fatigue, chronic sore throat.  Her  last menstrual period was in September of last year.  She feels that she  may be undergoing menopause.   MEDICATIONS:  1. Percocet several times a day.  2. Valium p.r.n.  3. Tylenol p.r.n.  4. DayQuil p.r.n.   ALLERGIES:  She denies drug allergies.   PHYSICAL EXAMINATION:  GENERAL:  She is a thin but healthy-appearing  white female in no distress who appears her stated age.  I could not  appreciate stigmata of chronic liver disease, thyromegaly or  lymphadenopathy.  VITAL SIGNS:  She is 5 feet 1 inches tall and weighs 113 pounds.  Blood  pressure is 90/52 and pulse of 60 and regular.  CHEST:  Clear, and she was in a regular rhythm without murmurs, gallops,  or rubs.  ABDOMEN:  There was no abdominal distention, organomegaly, masses or  tenderness.  Bowel sounds were active.  I could not appreciate a  succussion splash in the epigastric area.  Abdominal exam otherwise was  unremarkable.  EXTREMITIES:  Extremities were unremarkable.  MENTAL STATUS:  Clear.  RECTAL:  Deferred.   ASSESSMENT:  [name] obviously has bulimia with associated acid  reflux and probably erosive esophagitis.  There is nothing in her  history of suggest gastric outlet obstruction  or recurrent peptic ulcer  disease.  She currently is unemployed and has no insurance, as mentioned  above.   RECOMMENDATIONS:  1. Trial of Zegerid 40 mg twice a day.  This should be the better PPI      combination for her, since she does not eat regularly.  This should      give her a good acid suppressive therapy.  2. Standard anti-reflux maneuvers and patient education may be on acid      reflux this management.  3. I have urged her to see Dr. Jacki Cones for psychiatric consultation.      She may benefit from Paxil administration.  4. GI follow up in several weeks time.  We may need to proceed with      endoscopy, depending on her medical course.     Vania Rea. Jarold Motto, MD, Caleen Essex, FAGA  Electronically Signed    DRP/MedQ  DD: 01/11/2007  DT: 01/11/2007  Job #: [id number]   cc:   Dr. Trudee Grip, M.D.  Hilda Lias, M.D.

## 2011-04-30 ENCOUNTER — Other Ambulatory Visit: Payer: Self-pay | Admitting: Family Medicine

## 2011-04-30 DIAGNOSIS — Z1231 Encounter for screening mammogram for malignant neoplasm of breast: Secondary | ICD-10-CM

## 2011-05-17 ENCOUNTER — Emergency Department (HOSPITAL_COMMUNITY)
Admission: EM | Admit: 2011-05-17 | Discharge: 2011-05-17 | Disposition: A | Discharge status: Home / Self Care | Payer: Medicare Other | Attending: Emergency Medicine | Admitting: Emergency Medicine

## 2011-05-17 ENCOUNTER — Emergency Department (HOSPITAL_COMMUNITY): Payer: Medicare Other

## 2011-05-17 DIAGNOSIS — R0609 Other forms of dyspnea: Secondary | ICD-10-CM | POA: Insufficient documentation

## 2011-05-17 DIAGNOSIS — J343 Hypertrophy of nasal turbinates: Secondary | ICD-10-CM | POA: Insufficient documentation

## 2011-05-17 DIAGNOSIS — F3130 Bipolar disorder, current episode depressed, mild or moderate severity, unspecified: Secondary | ICD-10-CM | POA: Insufficient documentation

## 2011-05-17 DIAGNOSIS — R05 Cough: Secondary | ICD-10-CM | POA: Insufficient documentation

## 2011-05-17 DIAGNOSIS — J069 Acute upper respiratory infection, unspecified: Secondary | ICD-10-CM | POA: Insufficient documentation

## 2011-05-17 DIAGNOSIS — G40909 Epilepsy, unspecified, not intractable, without status epilepticus: Secondary | ICD-10-CM | POA: Insufficient documentation

## 2011-05-17 DIAGNOSIS — J3489 Other specified disorders of nose and nasal sinuses: Secondary | ICD-10-CM | POA: Insufficient documentation

## 2011-05-17 DIAGNOSIS — R062 Wheezing: Secondary | ICD-10-CM | POA: Insufficient documentation

## 2011-05-17 DIAGNOSIS — R0989 Other specified symptoms and signs involving the circulatory and respiratory systems: Secondary | ICD-10-CM | POA: Insufficient documentation

## 2011-05-17 DIAGNOSIS — R0602 Shortness of breath: Secondary | ICD-10-CM | POA: Insufficient documentation

## 2011-05-17 DIAGNOSIS — R059 Cough, unspecified: Secondary | ICD-10-CM | POA: Insufficient documentation

## 2011-05-17 DIAGNOSIS — R6889 Other general symptoms and signs: Secondary | ICD-10-CM | POA: Insufficient documentation

## 2011-05-17 LAB — URINALYSIS, ROUTINE W REFLEX MICROSCOPIC
Bilirubin Urine: NEGATIVE
Glucose, UA: NEGATIVE mg/dL
Hgb urine dipstick: NEGATIVE
Ketones, ur: NEGATIVE mg/dL
Protein, ur: NEGATIVE mg/dL
Urobilinogen, UA: 0.2 mg/dL (ref 0.0–1.0)

## 2011-05-17 LAB — POCT PREGNANCY, URINE: Preg Test, Ur: NEGATIVE

## 2011-05-21 ENCOUNTER — Ambulatory Visit
Admission: RE | Admit: 2011-05-21 | Discharge: 2011-05-21 | Disposition: A | Discharge status: Home / Self Care | Payer: Medicare Other | Source: Ambulatory Visit | Attending: Family Medicine | Admitting: Family Medicine

## 2011-05-21 ENCOUNTER — Other Ambulatory Visit: Payer: Self-pay | Admitting: Family Medicine

## 2011-05-21 ENCOUNTER — Ambulatory Visit
Admission: RE | Admit: 2011-05-21 | Discharge: 2011-05-21 | Disposition: A | Discharge status: Home / Self Care | Payer: 59 | Source: Ambulatory Visit | Attending: Family Medicine | Admitting: Family Medicine

## 2011-05-21 DIAGNOSIS — R52 Pain, unspecified: Secondary | ICD-10-CM

## 2011-05-21 DIAGNOSIS — R059 Cough, unspecified: Secondary | ICD-10-CM

## 2011-05-21 DIAGNOSIS — R05 Cough: Secondary | ICD-10-CM

## 2011-07-02 ENCOUNTER — Observation Stay (HOSPITAL_COMMUNITY)
Admission: EM | Admit: 2011-07-02 | Discharge: 2011-07-03 | Disposition: A | Discharge status: Home / Self Care | Payer: 59 | Attending: Emergency Medicine | Admitting: Emergency Medicine

## 2011-07-02 DIAGNOSIS — F411 Generalized anxiety disorder: Secondary | ICD-10-CM | POA: Insufficient documentation

## 2011-07-02 DIAGNOSIS — F319 Bipolar disorder, unspecified: Secondary | ICD-10-CM | POA: Insufficient documentation

## 2011-07-02 DIAGNOSIS — R079 Chest pain, unspecified: Principal | ICD-10-CM | POA: Insufficient documentation

## 2011-07-02 DIAGNOSIS — R0602 Shortness of breath: Secondary | ICD-10-CM | POA: Insufficient documentation

## 2011-07-02 DIAGNOSIS — M25519 Pain in unspecified shoulder: Secondary | ICD-10-CM | POA: Insufficient documentation

## 2011-07-02 DIAGNOSIS — M542 Cervicalgia: Secondary | ICD-10-CM | POA: Insufficient documentation

## 2011-07-02 DIAGNOSIS — R110 Nausea: Secondary | ICD-10-CM | POA: Insufficient documentation

## 2011-07-02 LAB — DIFFERENTIAL
Basophils Relative: 1 % (ref 0–1)
Eosinophils Absolute: 0.3 10*3/uL (ref 0.0–0.7)
Lymphs Abs: 2.4 10*3/uL (ref 0.7–4.0)
Monocytes Absolute: 0.4 10*3/uL (ref 0.1–1.0)
Monocytes Relative: 5 % (ref 3–12)

## 2011-07-02 LAB — CBC
MCH: 31.5 pg (ref 26.0–34.0)
MCHC: 35 g/dL (ref 30.0–36.0)
MCV: 90 fL (ref 78.0–100.0)
Platelets: 254 10*3/uL (ref 150–400)

## 2011-07-02 LAB — POCT I-STAT TROPONIN I: Troponin i, poc: 0 ng/mL (ref 0.00–0.08)

## 2011-07-03 ENCOUNTER — Observation Stay (HOSPITAL_COMMUNITY): Payer: 59

## 2011-07-03 ENCOUNTER — Emergency Department (HOSPITAL_COMMUNITY): Payer: 59

## 2011-07-03 DIAGNOSIS — R072 Precordial pain: Secondary | ICD-10-CM

## 2011-07-03 LAB — URINALYSIS, ROUTINE W REFLEX MICROSCOPIC
Glucose, UA: NEGATIVE mg/dL
Hgb urine dipstick: NEGATIVE
Ketones, ur: NEGATIVE mg/dL
Protein, ur: NEGATIVE mg/dL
Urobilinogen, UA: 0.2 mg/dL (ref 0.0–1.0)

## 2011-07-03 LAB — COMPREHENSIVE METABOLIC PANEL
AST: 17 U/L (ref 0–37)
Albumin: 4 g/dL (ref 3.5–5.2)
Alkaline Phosphatase: 65 U/L (ref 39–117)
BUN: 15 mg/dL (ref 6–23)
CO2: 23 mEq/L (ref 19–32)
Chloride: 106 mEq/L (ref 96–112)
Creatinine, Ser: 1 mg/dL (ref 0.50–1.10)
GFR calc non Af Amer: 60 mL/min — ABNORMAL LOW (ref >60)
Potassium: 3.6 mEq/L (ref 3.5–5.1)
Total Bilirubin: 0.2 mg/dL — ABNORMAL LOW (ref 0.3–1.2)

## 2011-07-03 LAB — CK TOTAL AND CKMB (NOT AT ARMC)
CK, MB: 1.6 ng/mL (ref 0.3–4.0)
Relative Index: INVALID (ref 0.0–2.5)

## 2011-07-03 LAB — TROPONIN I: Troponin I: <0.3 ng/mL (ref <0.30)

## 2011-07-06 ENCOUNTER — Ambulatory Visit: Payer: Self-pay

## 2011-07-06 ENCOUNTER — Ambulatory Visit
Admission: RE | Admit: 2011-07-06 | Discharge: 2011-07-06 | Disposition: A | Discharge status: Home / Self Care | Payer: 59 | Source: Ambulatory Visit | Attending: Family Medicine | Admitting: Family Medicine

## 2011-07-06 DIAGNOSIS — Z1231 Encounter for screening mammogram for malignant neoplasm of breast: Secondary | ICD-10-CM

## 2011-07-07 ENCOUNTER — Other Ambulatory Visit: Payer: Self-pay | Admitting: Family Medicine

## 2011-07-07 DIAGNOSIS — N644 Mastodynia: Secondary | ICD-10-CM

## 2011-07-07 DIAGNOSIS — N6451 Induration of breast: Secondary | ICD-10-CM

## 2011-07-14 ENCOUNTER — Other Ambulatory Visit: Payer: Self-pay

## 2011-07-14 ENCOUNTER — Other Ambulatory Visit (HOSPITAL_COMMUNITY)
Admission: RE | Admit: 2011-07-14 | Discharge: 2011-07-14 | Disposition: A | Discharge status: Home / Self Care | Payer: 59 | Source: Ambulatory Visit | Attending: Family Medicine | Admitting: Family Medicine

## 2011-07-14 DIAGNOSIS — Z01419 Encounter for gynecological examination (general) (routine) without abnormal findings: Secondary | ICD-10-CM | POA: Insufficient documentation

## 2011-07-15 ENCOUNTER — Other Ambulatory Visit: Payer: Self-pay | Admitting: Family Medicine

## 2011-07-15 DIAGNOSIS — M8580 Other specified disorders of bone density and structure, unspecified site: Secondary | ICD-10-CM

## 2011-07-16 ENCOUNTER — Other Ambulatory Visit: Payer: Self-pay | Admitting: Family Medicine

## 2011-07-16 ENCOUNTER — Ambulatory Visit
Admission: RE | Admit: 2011-07-16 | Discharge: 2011-07-16 | Disposition: A | Discharge status: Home / Self Care | Payer: 59 | Source: Ambulatory Visit | Attending: Family Medicine | Admitting: Family Medicine

## 2011-07-16 DIAGNOSIS — N6451 Induration of breast: Secondary | ICD-10-CM

## 2011-07-16 DIAGNOSIS — N644 Mastodynia: Secondary | ICD-10-CM

## 2011-07-17 ENCOUNTER — Other Ambulatory Visit: Payer: 59

## 2011-07-21 ENCOUNTER — Ambulatory Visit
Admission: RE | Admit: 2011-07-21 | Discharge: 2011-07-21 | Disposition: A | Discharge status: Home / Self Care | Payer: 59 | Source: Ambulatory Visit | Attending: Family Medicine | Admitting: Family Medicine

## 2011-07-21 DIAGNOSIS — M8580 Other specified disorders of bone density and structure, unspecified site: Secondary | ICD-10-CM

## 2011-07-21 LAB — DRUGS OF ABUSE SCREEN W/O ALC, ROUTINE URINE
Amphetamine Screen, Ur: NEGATIVE
Barbiturate Quant, Ur: NEGATIVE
Benzodiazepines.: POSITIVE — AB
Creatinine,U: 124
Marijuana Metabolite: NEGATIVE

## 2011-07-21 LAB — COMPREHENSIVE METABOLIC PANEL
ALT: 13
AST: 17
Albumin: 4
Alkaline Phosphatase: 49
BUN: 7
Chloride: 107
Potassium: 4.1
Sodium: 142
Total Bilirubin: 0.7
Total Protein: 6.3

## 2011-07-21 LAB — URINALYSIS, ROUTINE W REFLEX MICROSCOPIC
Glucose, UA: NEGATIVE
Leukocytes, UA: NEGATIVE
Protein, ur: NEGATIVE
Specific Gravity, Urine: 1.016
pH: 6.5

## 2011-07-21 LAB — CBC
HCT: 36.8
Platelets: 219
WBC: 3.8 — ABNORMAL LOW

## 2011-07-21 LAB — BENZODIAZEPINE, QUANTITATIVE, URINE
Alprazolam (GC/LC/MS), ur confirm: 86 ng/mL
Oxazepam GC/MS Conf: NEGATIVE

## 2011-07-21 LAB — URINE MICROSCOPIC-ADD ON

## 2011-08-07 LAB — URINALYSIS, ROUTINE W REFLEX MICROSCOPIC
Bilirubin Urine: NEGATIVE
Glucose, UA: NEGATIVE
Hgb urine dipstick: NEGATIVE
Ketones, ur: NEGATIVE
Protein, ur: NEGATIVE
Urobilinogen, UA: 0.2

## 2011-08-07 LAB — POCT CARDIAC MARKERS
CKMB, poc: <1 — ABNORMAL LOW
Myoglobin, poc: 36.1
Operator id: 4661

## 2011-08-07 LAB — BASIC METABOLIC PANEL
CO2: 26
Calcium: 9
Creatinine, Ser: 0.85
GFR calc Af Amer: >60
GFR calc non Af Amer: >60
Sodium: 138

## 2011-08-07 LAB — CBC
Hemoglobin: 11.9 — ABNORMAL LOW
MCHC: 34.4
RBC: 3.7 — ABNORMAL LOW
RDW: 15.8 — ABNORMAL HIGH

## 2011-08-07 LAB — DIFFERENTIAL
Basophils Absolute: 0
Basophils Relative: 1
Lymphocytes Relative: 22
Monocytes Relative: 8
Neutro Abs: 3.1
Neutrophils Relative %: 69

## 2011-08-10 LAB — I-STAT 8, (EC8 V) (CONVERTED LAB)
BUN: 16
BUN: 15
Chloride: 102
Chloride: 105
Glucose, Bld: 81
HCT: 40
Hemoglobin: 13.6
Hemoglobin: 13.6
Operator id: 146091
Operator id: 272551
Potassium: 4.2
Sodium: 138
pCO2, Ven: 44.6 — ABNORMAL LOW
pH, Ven: 7.373 — ABNORMAL HIGH

## 2011-08-10 LAB — CBC
HCT: 37.3
Hemoglobin: 12.6
MCHC: 33.7
MCHC: 33.8
MCV: 93.9
RBC: 4.03
RBC: 3.98
RBC: 4.34
WBC: 4.4
WBC: 6.8

## 2011-08-10 LAB — LIPID PANEL
HDL: 56
Total CHOL/HDL Ratio: 2.2
VLDL: 7

## 2011-08-10 LAB — ABO/RH: ABO/RH(D): A POS

## 2011-08-10 LAB — CARDIAC PANEL(CRET KIN+CKTOT+MB+TROPI)
CK, MB: 0.9
Relative Index: INVALID
Relative Index: INVALID
Total CK: 38
Troponin I: 0.01
Troponin I: <0.01

## 2011-08-10 LAB — TYPE AND SCREEN: ABO/RH(D): A POS

## 2011-08-10 LAB — DIFFERENTIAL
Basophils Relative: 0
Eosinophils Absolute: 0
Eosinophils Absolute: 0.1
Lymphs Abs: 0.8
Monocytes Relative: 11
Neutro Abs: 3
Neutrophils Relative %: 70
Neutrophils Relative %: 59

## 2011-08-10 LAB — COMPREHENSIVE METABOLIC PANEL
ALT: 14
Alkaline Phosphatase: 35 — ABNORMAL LOW
BUN: 14
CO2: 27
Calcium: 9.1
GFR calc non Af Amer: >60
Glucose, Bld: 90
Sodium: 138

## 2011-08-10 LAB — POCT I-STAT CREATININE
Creatinine, Ser: 1
Operator id: 146091

## 2011-08-10 LAB — BASIC METABOLIC PANEL
CO2: 27
Calcium: 9.3
Chloride: 104
Chloride: 107
Creatinine, Ser: 0.83
GFR calc Af Amer: >60
GFR calc Af Amer: >60
Potassium: 3.7
Sodium: 137
Sodium: 140

## 2011-08-10 LAB — VALPROIC ACID LEVEL
Valproic Acid Lvl: 28.2 — ABNORMAL LOW
Valproic Acid Lvl: 53.4

## 2011-08-10 LAB — D-DIMER, QUANTITATIVE: D-Dimer, Quant: <0.22

## 2011-08-10 LAB — HCG, SERUM, QUALITATIVE: Preg, Serum: NEGATIVE

## 2011-08-10 LAB — APTT: aPTT: 27

## 2011-10-13 ENCOUNTER — Ambulatory Visit: Payer: 59 | Admitting: Psychology

## 2011-10-23 ENCOUNTER — Ambulatory Visit: Payer: 59

## 2011-10-23 ENCOUNTER — Ambulatory Visit: Payer: 59 | Attending: Anesthesiology | Admitting: Physical Therapy

## 2011-10-23 DIAGNOSIS — M545 Low back pain, unspecified: Secondary | ICD-10-CM | POA: Insufficient documentation

## 2011-10-23 DIAGNOSIS — M2560 Stiffness of unspecified joint, not elsewhere classified: Secondary | ICD-10-CM | POA: Insufficient documentation

## 2011-10-23 DIAGNOSIS — M542 Cervicalgia: Secondary | ICD-10-CM | POA: Insufficient documentation

## 2011-10-23 DIAGNOSIS — IMO0001 Reserved for inherently not codable concepts without codable children: Secondary | ICD-10-CM | POA: Insufficient documentation

## 2011-11-04 ENCOUNTER — Encounter: Payer: 59 | Admitting: Physical Therapy

## 2011-11-10 ENCOUNTER — Ambulatory Visit: Payer: 59 | Attending: Anesthesiology | Admitting: Physical Therapy

## 2011-11-10 DIAGNOSIS — M2560 Stiffness of unspecified joint, not elsewhere classified: Secondary | ICD-10-CM | POA: Insufficient documentation

## 2011-11-10 DIAGNOSIS — IMO0001 Reserved for inherently not codable concepts without codable children: Secondary | ICD-10-CM | POA: Insufficient documentation

## 2011-11-10 DIAGNOSIS — M545 Low back pain, unspecified: Secondary | ICD-10-CM | POA: Insufficient documentation

## 2011-11-10 DIAGNOSIS — M542 Cervicalgia: Secondary | ICD-10-CM | POA: Insufficient documentation

## 2011-11-12 ENCOUNTER — Ambulatory Visit: Payer: 59 | Admitting: Physical Therapy

## 2011-11-16 ENCOUNTER — Ambulatory Visit: Payer: 59 | Admitting: Physical Therapy

## 2011-11-19 ENCOUNTER — Ambulatory Visit: Payer: 59 | Admitting: Physical Therapy

## 2011-11-23 ENCOUNTER — Encounter: Payer: 59 | Admitting: Physical Therapy

## 2011-11-27 ENCOUNTER — Encounter: Payer: 59 | Admitting: Physical Therapy

## 2011-12-01 ENCOUNTER — Ambulatory Visit (INDEPENDENT_AMBULATORY_CARE_PROVIDER_SITE_OTHER): Payer: Medicaid Other | Admitting: Psychology

## 2011-12-01 DIAGNOSIS — F331 Major depressive disorder, recurrent, moderate: Secondary | ICD-10-CM

## 2011-12-17 ENCOUNTER — Ambulatory Visit: Payer: 59 | Admitting: Physical Therapy

## 2011-12-22 ENCOUNTER — Encounter: Payer: 59 | Admitting: Physical Therapy

## 2012-04-21 ENCOUNTER — Emergency Department (HOSPITAL_COMMUNITY)
Admission: EM | Admit: 2012-04-21 | Discharge: 2012-04-22 | Disposition: A | Discharge status: Home / Self Care | Payer: 59 | Attending: Emergency Medicine | Admitting: Emergency Medicine

## 2012-04-21 ENCOUNTER — Encounter (HOSPITAL_COMMUNITY): Payer: Self-pay | Admitting: Emergency Medicine

## 2012-04-21 DIAGNOSIS — R1013 Epigastric pain: Secondary | ICD-10-CM | POA: Insufficient documentation

## 2012-04-21 DIAGNOSIS — Z79899 Other long term (current) drug therapy: Secondary | ICD-10-CM | POA: Insufficient documentation

## 2012-04-21 DIAGNOSIS — F3289 Other specified depressive episodes: Secondary | ICD-10-CM | POA: Insufficient documentation

## 2012-04-21 DIAGNOSIS — F329 Major depressive disorder, single episode, unspecified: Secondary | ICD-10-CM | POA: Insufficient documentation

## 2012-04-21 HISTORY — DX: Major depressive disorder, single episode, unspecified: F32.9

## 2012-04-21 HISTORY — DX: Depression, unspecified: F32.A

## 2012-04-21 LAB — COMPREHENSIVE METABOLIC PANEL
ALT: 20 U/L (ref 0–35)
AST: 24 U/L (ref 0–37)
Alkaline Phosphatase: 51 U/L (ref 39–117)
BUN: 10 mg/dL (ref 6–23)
GFR calc Af Amer: 78 mL/min — ABNORMAL LOW (ref >90)
GFR calc non Af Amer: 68 mL/min — ABNORMAL LOW (ref >90)
Total Bilirubin: 0.5 mg/dL (ref 0.3–1.2)

## 2012-04-21 LAB — CBC WITH DIFFERENTIAL/PLATELET
Basophils Relative: 0 % (ref 0–1)
Eosinophils Absolute: 0 10*3/uL (ref 0.0–0.7)
Eosinophils Relative: 0 % (ref 0–5)
Hemoglobin: 13.6 g/dL (ref 12.0–15.0)
MCH: 31.7 pg (ref 26.0–34.0)
MCHC: 34.9 g/dL (ref 30.0–36.0)
Monocytes Absolute: 0.4 10*3/uL (ref 0.1–1.0)
Monocytes Relative: 6 % (ref 3–12)
Neutrophils Relative %: 63 % (ref 43–77)

## 2012-04-21 LAB — URINALYSIS, ROUTINE W REFLEX MICROSCOPIC
Bilirubin Urine: NEGATIVE
Ketones, ur: NEGATIVE mg/dL
Nitrite: NEGATIVE
pH: 6.5 (ref 5.0–8.0)

## 2012-04-21 LAB — URINE MICROSCOPIC-ADD ON

## 2012-04-21 MED ORDER — ONDANSETRON HCL 4 MG/2ML IJ SOLN
4.0000 mg | Freq: Once | INTRAMUSCULAR | Status: AC
  Administered 2012-04-21: 4 mg via INTRAVENOUS
  Filled 2012-04-21: qty 2

## 2012-04-21 MED ORDER — PANTOPRAZOLE SODIUM 40 MG IV SOLR
40.0000 mg | Freq: Once | INTRAVENOUS | Status: AC
  Administered 2012-04-21: 40 mg via INTRAVENOUS
  Filled 2012-04-21: qty 40

## 2012-04-21 MED ORDER — HYDROMORPHONE HCL PF 1 MG/ML IJ SOLN: 1.0000 mg | Freq: Once | INTRAMUSCULAR | Status: DC

## 2012-04-21 MED ORDER — FAMOTIDINE 20 MG PO TABS
20.0000 mg | ORAL_TABLET | Freq: Once | ORAL | Status: AC
  Administered 2012-04-21: 20 mg via ORAL
  Filled 2012-04-21: qty 1

## 2012-04-21 MED ORDER — GI COCKTAIL ~~LOC~~
30.0000 mL | Freq: Once | ORAL | Status: AC
  Administered 2012-04-21: 30 mL via ORAL
  Filled 2012-04-21: qty 30

## 2012-04-21 NOTE — ED Provider Notes (Signed)
History     CSN: [id number]  Arrival date & time 04/21/12  1905   First MD Initiated Contact with Patient 04/21/12 2306      Chief Complaint  Patient presents with  . Abdominal Pain  . Headache    (Consider location/radiation/quality/duration/timing/severity/associated sxs/prior treatment) HPI Hx per PT, Epigastric pain x 4 days, on clinda for tooth infection, no h/o GB problems, feverish today with chills. No rectal bleeding or h/o PUD. No h/o same. No CP or SOB, mod in severity. No N/V. Past Medical History  Diagnosis Date  . Depression     Past Surgical History  Procedure Date  . Cervical spine surgery   . Cesarean section   . Tubal ligation     History reviewed. No pertinent family history.  History  Substance Use Topics  . Smoking status: Never Smoker   . Smokeless tobacco: Not on file  . Alcohol Use: No     quit drinking 7 months    OB History    Grav Para Term Preterm Abortions TAB SAB Ect Mult Living                  Review of Systems  Constitutional: Negative for fever and chills.  HENT: Negative for neck pain and neck stiffness.   Eyes: Negative for pain.  Respiratory: Negative for shortness of breath.   Cardiovascular: Negative for chest pain.  Gastrointestinal: Positive for abdominal pain. Negative for diarrhea, constipation and blood in stool.  Genitourinary: Negative for dysuria.  Musculoskeletal: Negative for back pain.  Skin: Negative for rash.  Neurological: Negative for headaches.  All other systems reviewed and are negative.    Allergies  Review of patient's allergies indicates no known allergies.  Home Medications   Current Outpatient Rx  Name Route Sig Dispense Refill  . ACETAMINOPHEN 500 MG PO TABS Oral Take 500 mg by mouth every 6 (six) hours as needed. For pain    . CITALOPRAM HYDROBROMIDE 10 MG PO TABS Oral Take 20 mg by mouth daily.    Marland Kitchen CLINDAMYCIN HCL 300 MG PO CAPS Oral Take 300 mg by mouth 4 (four) times daily.    .  TRAZODONE HCL 100 MG PO TABS Oral Take 100 mg by mouth at bedtime.      BP 128/68  Pulse 89  Temp 98.3 F (36.8 C) (Oral)  Resp 19  SpO2 99%  Physical Exam  Constitutional: She is oriented to person, place, and time. She appears well-developed and well-nourished.  HENT:  Head: Normocephalic and atraumatic.       No dental tenderness or gingival swelling. No trismus.  Eyes: Conjunctivae and EOM are normal. Pupils are equal, round, and reactive to light.  Neck: Trachea normal. Neck supple. No thyromegaly present.  Cardiovascular: Normal rate, regular rhythm, S1 normal, S2 normal and normal pulses.     No systolic murmur is present   No diastolic murmur is present  Pulses:      Radial pulses are 2+ on the right side, and 2+ on the left side.  Pulmonary/Chest: Effort normal and breath sounds normal. She has no wheezes. She has no rhonchi. She has no rales. She exhibits no tenderness.  Abdominal: Soft. Normal appearance and bowel sounds are normal. There is tenderness. There is no rebound, no guarding, no CVA tenderness and negative Murphy's sign.       Epi and RUQ tenderness  Musculoskeletal:       BLE:s Calves nontender, no cords or erythema, negative  Homans sign  Neurological: She is alert and oriented to person, place, and time. She has normal strength. No cranial nerve deficit or sensory deficit. GCS eye subscore is 4. GCS verbal subscore is 5. GCS motor subscore is 6.  Skin: Skin is warm and dry. No rash noted. She is not diaphoretic.  Psychiatric: Her speech is normal.       Cooperative and appropriate    ED Course  Procedures (including critical care time)  Results for orders placed during the hospital encounter of 04/21/12  URINALYSIS, ROUTINE W REFLEX MICROSCOPIC      Component Value Range   Color, Urine YELLOW  YELLOW   APPearance CLEAR  CLEAR   Specific Gravity, Urine 1.008  1.005 - 1.030   pH 6.5  5.0 - 8.0   Glucose, UA NEGATIVE  NEGATIVE mg/dL   Hgb urine  dipstick LARGE (*) NEGATIVE   Bilirubin Urine NEGATIVE  NEGATIVE   Ketones, ur NEGATIVE  NEGATIVE mg/dL   Protein, ur NEGATIVE  NEGATIVE mg/dL   Urobilinogen, UA 0.2  0.0 - 1.0 mg/dL   Nitrite NEGATIVE  NEGATIVE   Leukocytes, UA NEGATIVE  NEGATIVE  CBC WITH DIFFERENTIAL      Component Value Range   WBC 7.2  4.0 - 10.5 K/uL   RBC 4.29  3.87 - 5.11 MIL/uL   Hemoglobin 13.6  12.0 - 15.0 g/dL   HCT 45.4  09.8 - 11.9 %   MCV 90.9  78.0 - 100.0 fL   MCH 31.7  26.0 - 34.0 pg   MCHC 34.9  30.0 - 36.0 g/dL   RDW 14.7  82.9 - 56.2 %   Platelets 269  150 - 400 K/uL   Neutrophils Relative 63  43 - 77 %   Neutro Abs 4.5  1.7 - 7.7 K/uL   Lymphocytes Relative 31  12 - 46 %   Lymphs Abs 2.2  0.7 - 4.0 K/uL   Monocytes Relative 6  3 - 12 %   Monocytes Absolute 0.4  0.1 - 1.0 K/uL   Eosinophils Relative 0  0 - 5 %   Eosinophils Absolute 0.0  0.0 - 0.7 K/uL   Basophils Relative 0  0 - 1 %   Basophils Absolute 0.0  0.0 - 0.1 K/uL  COMPREHENSIVE METABOLIC PANEL      Component Value Range   Sodium 139  135 - 145 mEq/L   Potassium 3.1 (*) 3.5 - 5.1 mEq/L   Chloride 99  96 - 112 mEq/L   CO2 24  19 - 32 mEq/L   Glucose, Bld 88  70 - 99 mg/dL   BUN 10  6 - 23 mg/dL   Creatinine, Ser 1.30  0.50 - 1.10 mg/dL   Calcium 86.5  8.4 - 78.4 mg/dL   Total Protein 7.5  6.0 - 8.3 g/dL   Albumin 4.6  3.5 - 5.2 g/dL   AST 24  0 - 37 U/L   ALT 20  0 - 35 U/L   Alkaline Phosphatase 51  39 - 117 U/L   Total Bilirubin 0.5  0.3 - 1.2 mg/dL   GFR calc non Af Amer 68 (*) >90 mL/min   GFR calc Af Amer 78 (*) >90 mL/min  URINE MICROSCOPIC-ADD ON      Component Value Range   Squamous Epithelial / LPF RARE  RARE   WBC, UA 0-2  <3 WBC/hpf   RBC / HPF 3-6  <3 RBC/hpf   Bacteria, UA RARE  RARE  LIPASE, BLOOD      Component Value Range   Lipase 70 (*) 11 - 59 U/L  POCT I-STAT TROPONIN I      Component Value Range   Troponin i, poc 0.00  0.00 - 0.08 ng/mL   Comment 3            US Abdomen Limited  04/22/2012   *RADIOLOGY REPORT*  Clinical Data:  Right upper quadrant pain.  COMPLETE ABDOMINAL ULTRASOUND  Comparison:  None.  Findings:  Gallbladder:  No gallstones, gallbladder wall thickening, or pericholecystic fluid.  Common bile duct:  Normal caliber with measured diameter of 4.9 mm.  Liver:  No focal lesion identified.  Within normal limits in parenchymal echogenicity.  IVC:  Appears normal.  Pancreas:  No focal abnormality seen.  Spleen:  Spleen length measures 10.1 cm.  Normal homogeneous parenchymal echotexture.  Right Kidney:  Right kidney measures 10.5 cm length.  No hydronephrosis.  Left Kidney:  Left kidney measures 11.5 cm length.  No hydronephrosis.  Abdominal aorta:  No aneurysm identified.  IMPRESSION: Negative abdominal ultrasound.  Original Report Authenticated By: Marlon Pel, M.D.    IV Protonix, Pepcid, GI cocktail. Some right upper quadrant tenderness an ultrasound obtained as above. Labs reviewed as above.   Date: 04/22/2012  Rate: 47  Rhythm: normal sinus rhythm  QRS Axis: normal  Intervals: normal  ST/T Wave abnormalities: nonspecific ST changes  Conduction Disutrbances:none  Narrative Interpretation: old INF Q waves  Old EKG Reviewed: none available    Recheck at 2:45 AM - feeling better and stable for discharge home. MDM   Epigastric pain is on clindamycin. Plan stop in the clinda changed to penicillin for dental infection. We'll also add Pepcid. Verbal and written GERD precautions verbalizes understood. Will keep scheduled followup with dentist and return here for any worsening condition.        Sunnie Nielsen, MD 04/22/12 [phone]

## 2012-04-21 NOTE — ED Notes (Signed)
Pt reports for a few days, she's been having neck pain and head pain and also feels bloated as well, pt c/o mid abd pain- sharp a/w nausea; pt reports hx of tooth abscess and reports has been having problems since may 18th and has been on and off abx; pt reports fevers

## 2012-04-22 ENCOUNTER — Emergency Department (HOSPITAL_COMMUNITY): Payer: 59

## 2012-04-22 LAB — POCT I-STAT TROPONIN I: Troponin i, poc: 0 ng/mL (ref 0.00–0.08)

## 2012-04-22 MED ORDER — PENICILLIN V POTASSIUM 500 MG PO TABS: 500.0000 mg | ORAL_TABLET | Freq: Four times a day (QID) | ORAL | Status: AC

## 2012-04-22 MED ORDER — FAMOTIDINE 20 MG PO TABS: 20.0000 mg | ORAL_TABLET | Freq: Two times a day (BID) | ORAL | Status: DC

## 2012-04-22 NOTE — Discharge Instructions (Signed)

## 2012-07-06 ENCOUNTER — Other Ambulatory Visit: Payer: Self-pay | Admitting: Family Medicine

## 2012-07-06 DIAGNOSIS — Z1231 Encounter for screening mammogram for malignant neoplasm of breast: Secondary | ICD-10-CM

## 2012-07-21 ENCOUNTER — Ambulatory Visit: Payer: 59

## 2012-08-17 ENCOUNTER — Ambulatory Visit: Payer: 59

## 2012-09-04 ENCOUNTER — Encounter (HOSPITAL_COMMUNITY): Payer: Self-pay | Admitting: *Deleted

## 2012-09-04 ENCOUNTER — Emergency Department (INDEPENDENT_AMBULATORY_CARE_PROVIDER_SITE_OTHER)
Admission: EM | Admit: 2012-09-04 | Discharge: 2012-09-04 | Disposition: A | Discharge status: Home / Self Care | Payer: Medicare Other | Source: Home / Self Care

## 2012-09-04 DIAGNOSIS — R319 Hematuria, unspecified: Secondary | ICD-10-CM

## 2012-09-04 DIAGNOSIS — N390 Urinary tract infection, site not specified: Secondary | ICD-10-CM

## 2012-09-04 DIAGNOSIS — IMO0002 Reserved for concepts with insufficient information to code with codable children: Secondary | ICD-10-CM | POA: Insufficient documentation

## 2012-09-04 DIAGNOSIS — R81 Glycosuria: Secondary | ICD-10-CM

## 2012-09-04 HISTORY — DX: Reserved for concepts with insufficient information to code with codable children: IMO0002

## 2012-09-04 LAB — POCT URINALYSIS DIP (DEVICE)
Ketones, ur: 40 mg/dL — AB
Nitrite: POSITIVE — AB
Protein, ur: >=300 mg/dL — AB

## 2012-09-04 MED ORDER — CEPHALEXIN 500 MG PO CAPS: 500.0000 mg | ORAL_CAPSULE | Freq: Three times a day (TID) | ORAL | Status: DC

## 2012-09-04 NOTE — ED Provider Notes (Signed)
History     CSN: [id number]  Arrival date & time 09/04/12  1223   None     Chief Complaint  Patient presents with  . Urinary Tract Infection    (Consider location/radiation/quality/duration/timing/severity/associated sxs/prior treatment) HPI Comments: 46 year old female presents with chief complaints of dysuria, suprapubic pain, urgency, frequency, and dribbling of urine, and an odor to the urine. She also has mild intermittent low back pain. She has mild nausea but no fever or chills. She has been taking AZO without relief.  Patient is a 46 y.o. female presenting with urinary tract infection.  Urinary Tract Infection    Past Medical History  Diagnosis Date  . Depression   . DDD (degenerative disc disease)   . DDD (degenerative disc disease)   . DDD (degenerative disc disease)     Past Surgical History  Procedure Date  . Cervical spine surgery   . Cesarean section   . Tubal ligation     Family History  Problem Relation Age of Onset  . Family history unknown: Yes    History  Substance Use Topics  . Smoking status: Never Smoker   . Smokeless tobacco: Not on file  . Alcohol Use: No     Comment: quit drinking 7 months    OB History    Grav Para Term Preterm Abortions TAB SAB Ect Mult Living                  Review of Systems  Constitutional: Positive for fatigue. Negative for fever and activity change.  HENT: Negative.   Respiratory: Negative.   Cardiovascular: Negative.   Gastrointestinal: Positive for nausea. Negative for vomiting.  Genitourinary: Positive for dysuria, urgency, frequency, hematuria, decreased urine volume, difficulty urinating and pelvic pain.  Musculoskeletal: Negative.   Skin: Negative.   Neurological: Negative.   Psychiatric/Behavioral: Negative.     Allergies  Review of patient's allergies indicates no known allergies.  Home Medications   Current Outpatient Rx  Name  Route  Sig  Dispense  Refill  . ACETAMINOPHEN 500 MG  PO TABS   Oral   Take 500 mg by mouth every 6 (six) hours as needed. For pain         . CITALOPRAM HYDROBROMIDE 10 MG PO TABS   Oral   Take 20 mg by mouth daily.         Marland Kitchen FAMOTIDINE 20 MG PO TABS   Oral   Take 1 tablet (20 mg total) by mouth 2 (two) times daily.   30 tablet   0   . CEPHALEXIN 500 MG PO CAPS   Oral   Take 1 capsule (500 mg total) by mouth 3 (three) times daily.   21 capsule   0   . TRAZODONE HCL 100 MG PO TABS   Oral   Take 100 mg by mouth at bedtime.           BP 124/78  Pulse 90  Temp 98 F (36.7 C) (Oral)  Resp 18  SpO2 97%  LMP 09/04/2012  Physical Exam  Constitutional: She is oriented to person, place, and time. She appears well-developed and well-nourished. No distress.  Eyes: EOM are normal.  Neck: Normal range of motion. Neck supple.  Cardiovascular: Normal rate and normal heart sounds.   Pulmonary/Chest: Effort normal and breath sounds normal. No respiratory distress.  Abdominal: Soft. There is no tenderness.  Musculoskeletal: Normal range of motion. She exhibits no edema and no tenderness.  Neurological: She is alert  and oriented to person, place, and time.  Skin: Skin is warm and dry. No erythema.  Psychiatric: She has a normal mood and affect.    ED Course  Procedures (including critical care time)  Labs Reviewed  POCT URINALYSIS DIP (DEVICE) - Abnormal; Notable for the following:    Glucose, UA 250 (*)     Bilirubin Urine LARGE (*)     Ketones, ur 40 (*)     Hgb urine dipstick LARGE (*)     Protein, ur >=300 (*)     Nitrite POSITIVE (*)     Leukocytes, UA LARGE (*)  Biochemical Testing Only. Please order routine urinalysis from main lab if confirmatory testing is needed.   All other components within normal limits   No results found.   1. UTI (lower urinary tract infection)   2. Glycosuria   3. Hematuria       MDM  Keflex 500 mg 3 times a day for 7 days Plenty of fluids stay well hydrated PCP for an  appointment to follow up on glycosuria.        Hayden Rasmussen, NP 09/04/12 [phone]

## 2012-09-04 NOTE — ED Notes (Signed)
"   I think that I have a uti or a kidney infection. It started about a week ago - my lower back hurts and the bottom of my abdomen. It feels like something is stabbing me when I pee"

## 2012-09-04 NOTE — ED Provider Notes (Signed)
Medical screening examination/treatment/procedure(s) were performed by resident physician or non-physician practitioner and as supervising physician I was immediately available for consultation/collaboration.   KINDL,JAMES DOUGLAS MD.    James D Kindl, MD 09/04/12 1343 

## 2012-09-15 ENCOUNTER — Ambulatory Visit
Admission: RE | Admit: 2012-09-15 | Discharge: 2012-09-15 | Disposition: A | Discharge status: Home / Self Care | Payer: Medicare Other | Source: Ambulatory Visit | Attending: Family Medicine | Admitting: Family Medicine

## 2012-09-15 DIAGNOSIS — Z1231 Encounter for screening mammogram for malignant neoplasm of breast: Secondary | ICD-10-CM

## 2012-12-13 ENCOUNTER — Emergency Department: Payer: Self-pay | Admitting: Emergency Medicine

## 2013-03-28 ENCOUNTER — Other Ambulatory Visit: Payer: Self-pay | Admitting: Neurosurgery

## 2013-03-28 DIAGNOSIS — M5030 Other cervical disc degeneration, unspecified cervical region: Secondary | ICD-10-CM

## 2013-04-04 ENCOUNTER — Inpatient Hospital Stay: Admission: RE | Admit: 2013-04-04 | Payer: Medicare Other | Source: Ambulatory Visit

## 2013-04-04 ENCOUNTER — Other Ambulatory Visit: Payer: Medicare Other

## 2013-04-11 ENCOUNTER — Inpatient Hospital Stay
Admission: RE | Admit: 2013-04-11 | Discharge: 2013-04-11 | Disposition: A | Discharge status: Home / Self Care | Payer: Medicare Other | Source: Ambulatory Visit | Attending: Neurosurgery | Admitting: Neurosurgery

## 2013-04-11 ENCOUNTER — Other Ambulatory Visit: Payer: Medicare Other

## 2013-04-19 ENCOUNTER — Ambulatory Visit
Admission: RE | Admit: 2013-04-19 | Discharge: 2013-04-19 | Disposition: A | Discharge status: Home / Self Care | Payer: Medicare HMO | Source: Ambulatory Visit | Attending: Neurosurgery | Admitting: Neurosurgery

## 2013-04-19 VITALS — BP 87/51 | HR 66 | Wt 107.0 lb

## 2013-04-19 DIAGNOSIS — M5030 Other cervical disc degeneration, unspecified cervical region: Secondary | ICD-10-CM

## 2013-04-19 DIAGNOSIS — IMO0002 Reserved for concepts with insufficient information to code with codable children: Secondary | ICD-10-CM

## 2013-04-19 MED ORDER — ONDANSETRON HCL 4 MG/2ML IJ SOLN
4.0000 mg | Freq: Once | INTRAMUSCULAR | Status: AC
  Administered 2013-04-19: 4 mg via INTRAMUSCULAR

## 2013-04-19 MED ORDER — IOHEXOL 300 MG/ML  SOLN
10.0000 mL | Freq: Once | INTRAMUSCULAR | Status: AC | PRN
  Administered 2013-04-19: 10 mL via INTRATHECAL

## 2013-04-19 MED ORDER — DIAZEPAM 5 MG PO TABS
5.0000 mg | ORAL_TABLET | Freq: Once | ORAL | Status: AC
  Administered 2013-04-19: 5 mg via ORAL

## 2013-04-19 MED ORDER — HYDROMORPHONE HCL PF 2 MG/ML IJ SOLN
2.0000 mg | Freq: Once | INTRAMUSCULAR | Status: AC
  Administered 2013-04-19: 2 mg via INTRAMUSCULAR

## 2013-04-19 NOTE — Progress Notes (Addendum)
Patient states she has been off Celexa and Trazodone for the past two days.  jkl

## 2013-08-02 ENCOUNTER — Other Ambulatory Visit: Payer: Self-pay | Admitting: Family Medicine

## 2013-08-02 ENCOUNTER — Other Ambulatory Visit (HOSPITAL_COMMUNITY)
Admission: RE | Admit: 2013-08-02 | Discharge: 2013-08-02 | Disposition: A | Discharge status: Home / Self Care | Payer: Medicare HMO | Source: Ambulatory Visit | Attending: Family Medicine | Admitting: Family Medicine

## 2013-08-02 DIAGNOSIS — R87619 Unspecified abnormal cytological findings in specimens from cervix uteri: Secondary | ICD-10-CM | POA: Insufficient documentation

## 2013-08-02 DIAGNOSIS — N6452 Nipple discharge: Secondary | ICD-10-CM

## 2013-08-02 DIAGNOSIS — R87810 Cervical high risk human papillomavirus (HPV) DNA test positive: Secondary | ICD-10-CM | POA: Insufficient documentation

## 2013-08-02 DIAGNOSIS — Z0000 Encounter for general adult medical examination without abnormal findings: Secondary | ICD-10-CM | POA: Insufficient documentation

## 2013-08-15 ENCOUNTER — Ambulatory Visit
Admission: RE | Admit: 2013-08-15 | Discharge: 2013-08-15 | Disposition: A | Discharge status: Home / Self Care | Payer: Commercial Managed Care - HMO | Source: Ambulatory Visit | Attending: Family Medicine | Admitting: Family Medicine

## 2013-08-15 DIAGNOSIS — N6452 Nipple discharge: Secondary | ICD-10-CM

## 2013-08-23 ENCOUNTER — Emergency Department (HOSPITAL_COMMUNITY): Payer: Medicare HMO

## 2013-08-23 ENCOUNTER — Encounter (HOSPITAL_COMMUNITY): Payer: Self-pay | Admitting: Emergency Medicine

## 2013-08-23 ENCOUNTER — Emergency Department (HOSPITAL_COMMUNITY)
Admission: EM | Admit: 2013-08-23 | Discharge: 2013-08-23 | Disposition: A | Discharge status: Home / Self Care | Payer: Medicare HMO | Attending: Emergency Medicine | Admitting: Emergency Medicine

## 2013-08-23 DIAGNOSIS — H53149 Visual discomfort, unspecified: Secondary | ICD-10-CM | POA: Insufficient documentation

## 2013-08-23 DIAGNOSIS — R112 Nausea with vomiting, unspecified: Secondary | ICD-10-CM | POA: Insufficient documentation

## 2013-08-23 DIAGNOSIS — Z79899 Other long term (current) drug therapy: Secondary | ICD-10-CM | POA: Insufficient documentation

## 2013-08-23 DIAGNOSIS — F329 Major depressive disorder, single episode, unspecified: Secondary | ICD-10-CM | POA: Insufficient documentation

## 2013-08-23 DIAGNOSIS — M129 Arthropathy, unspecified: Secondary | ICD-10-CM | POA: Insufficient documentation

## 2013-08-23 DIAGNOSIS — R51 Headache: Secondary | ICD-10-CM | POA: Insufficient documentation

## 2013-08-23 DIAGNOSIS — F3289 Other specified depressive episodes: Secondary | ICD-10-CM | POA: Insufficient documentation

## 2013-08-23 DIAGNOSIS — R0789 Other chest pain: Secondary | ICD-10-CM | POA: Insufficient documentation

## 2013-08-23 DIAGNOSIS — Z791 Long term (current) use of non-steroidal anti-inflammatories (NSAID): Secondary | ICD-10-CM | POA: Insufficient documentation

## 2013-08-23 DIAGNOSIS — Z3202 Encounter for pregnancy test, result negative: Secondary | ICD-10-CM | POA: Insufficient documentation

## 2013-08-23 LAB — CBC WITH DIFFERENTIAL/PLATELET
Basophils Absolute: 0 10*3/uL (ref 0.0–0.1)
Basophils Relative: 0 % (ref 0–1)
HCT: 35.7 % — ABNORMAL LOW (ref 36.0–46.0)
Lymphocytes Relative: 17 % (ref 12–46)
Monocytes Absolute: 0.4 10*3/uL (ref 0.1–1.0)
Neutro Abs: 5.9 10*3/uL (ref 1.7–7.7)
Neutrophils Relative %: 78 % — ABNORMAL HIGH (ref 43–77)
RDW: 12.8 % (ref 11.5–15.5)
WBC: 7.6 10*3/uL (ref 4.0–10.5)

## 2013-08-23 LAB — COMPREHENSIVE METABOLIC PANEL
ALT: 13 U/L (ref 0–35)
Albumin: 4.1 g/dL (ref 3.5–5.2)
Alkaline Phosphatase: 49 U/L (ref 39–117)
BUN: 8 mg/dL (ref 6–23)
Chloride: 104 mEq/L (ref 96–112)
Glucose, Bld: 125 mg/dL — ABNORMAL HIGH (ref 70–99)
Potassium: 3.5 mEq/L (ref 3.5–5.1)
Sodium: 139 mEq/L (ref 135–145)
Total Bilirubin: 0.4 mg/dL (ref 0.3–1.2)

## 2013-08-23 LAB — PREGNANCY, URINE: Preg Test, Ur: NEGATIVE

## 2013-08-23 LAB — SALICYLATE LEVEL: Salicylate Lvl: 2.2 mg/dL — ABNORMAL LOW (ref 2.8–20.0)

## 2013-08-23 LAB — ACETAMINOPHEN LEVEL: Acetaminophen (Tylenol), Serum: <15 ug/mL (ref 10–30)

## 2013-08-23 LAB — RAPID URINE DRUG SCREEN, HOSP PERFORMED
Opiates: NOT DETECTED
Tetrahydrocannabinol: NOT DETECTED

## 2013-08-23 MED ORDER — SODIUM CHLORIDE 0.9 % IV BOLUS (SEPSIS)
1000.0000 mL | Freq: Once | INTRAVENOUS | Status: AC
  Administered 2013-08-23: 1000 mL via INTRAVENOUS

## 2013-08-23 MED ORDER — NAPROXEN 500 MG PO TABS: 500.0000 mg | ORAL_TABLET | Freq: Two times a day (BID) | ORAL | Status: DC

## 2013-08-23 MED ORDER — METOCLOPRAMIDE HCL 5 MG/ML IJ SOLN
10.0000 mg | Freq: Once | INTRAMUSCULAR | Status: AC
  Administered 2013-08-23: 10 mg via INTRAVENOUS
  Filled 2013-08-23: qty 2

## 2013-08-23 MED ORDER — DIPHENHYDRAMINE HCL 50 MG/ML IJ SOLN
25.0000 mg | Freq: Once | INTRAMUSCULAR | Status: AC
  Administered 2013-08-23: 25 mg via INTRAVENOUS
  Filled 2013-08-23: qty 1

## 2013-08-23 MED ORDER — ACETAMINOPHEN 325 MG PO TABS
650.0000 mg | ORAL_TABLET | Freq: Once | ORAL | Status: AC
  Administered 2013-08-23: 650 mg via ORAL
  Filled 2013-08-23: qty 2

## 2013-08-23 NOTE — ED Notes (Signed)
Preg results reported to CT. CT reports pt will be next.

## 2013-08-23 NOTE — ED Notes (Addendum)
Pt presents via [facility] for headache and "chest tightness", ems reports pt went out last night and did "molly". Pt reports taking "a block of molly earlier in the day and then another block around 1900." Pt now reports headache and chest tightness, and also reports feeling "jittery". Pt also reports taking 3 caffeine pills this morning to try and stop the headache. EMS reports giving 4 mg of zofran en route, pt complained of [place]. EKG unremarkable per EMS . Pt is alert and oriented.

## 2013-08-23 NOTE — ED Provider Notes (Signed)
CSN: [id number]     Arrival date & time 08/23/13  0707 History   First MD Initiated Contact with Patient 08/23/13 [phone]     Chief Complaint  Patient presents with  . Headache  . Chest Pain   (Consider location/radiation/quality/duration/timing/severity/associated sxs/prior Treatment) HPI Comments: 47 year old female with a history of depression and arthritis who presents with a complaint of a headache and chest pain. She states approximately 7:00 last night she used ecstasy and has been taking caffeine throughout the evening. She states that her headache is frontal, throbbing, nothing seems to make it better, worse with bright lights and associated photophobia. She also has associated nausea and several episodes of vomiting. This is different from prior headaches, did not start until after she took the ecstasy. She states this was in a groundup powdered form which she ingested.  The patient has no history of cardiac disease, or chest pain was located in her upper left chest, it has since eased off and is almost completely gone. She denies any alcohol use, cocaine use and has no history of exertional dyspnea or chest pain.  Patient is a 47 y.o. female presenting with headaches. The history is provided by the patient.  Headache   Past Medical History  Diagnosis Date  . Depression   . DDD (degenerative disc disease)   . DDD (degenerative disc disease)   . DDD (degenerative disc disease)    Past Surgical History  Procedure Laterality Date  . Cervical spine surgery    . Cesarean section    . Tubal ligation     No family history on file. History  Substance Use Topics  . Smoking status: Never Smoker   . Smokeless tobacco: Not on file  . Alcohol Use: No     Comment: quit drinking 7 months   OB History   Grav Para Term Preterm Abortions TAB SAB Ect Mult Living                 Review of Systems  Neurological: Positive for headaches.  All other systems reviewed and are  negative.    Allergies  Review of patient's allergies indicates no known allergies.  Home Medications   Current Outpatient Rx  Name  Route  Sig  Dispense  Refill  . ALPRAZolam (XANAX) 0.5 MG tablet   Oral   Take 0.5 mg by mouth 3 (three) times daily as needed for sleep or anxiety.         . nortriptyline (PAMELOR) 25 MG capsule   Oral   Take 25 mg by mouth 3 (three) times daily.          Marland Kitchen oxyCODONE-acetaminophen (PERCOCET) 7.5-325 MG per tablet   Oral   Take 1 tablet by mouth every 6 (six) hours as needed for pain.          . traZODone (DESYREL) 100 MG tablet   Oral   Take 100 mg by mouth at bedtime.         . naproxen (NAPROSYN) 500 MG tablet   Oral   Take 1 tablet (500 mg total) by mouth 2 (two) times daily with a meal.   30 tablet   0    BP 95/59  Pulse 93  Temp(Src) 98.6 F (37 C) (Oral)  Resp 12  SpO2 98%  LMP 08/18/2013 Physical Exam  Nursing note and vitals reviewed. Constitutional: She appears well-developed and well-nourished.  Uncomfortable appearing  HENT:  Head: Normocephalic and atraumatic.  Mouth/Throat: Oropharynx is clear  and moist. No oropharyngeal exudate.  Oropharynx is clear, sinuses are nontender, no nasal discharge  Eyes: Conjunctivae and EOM are normal. Pupils are equal, round, and reactive to light. Right eye exhibits no discharge. Left eye exhibits no discharge. No scleral icterus.  Neck: Normal range of motion. Neck supple. No JVD present. No thyromegaly present.  Very supple neck, no lymphadenopathy or thyromegaly  Cardiovascular: Normal rate, regular rhythm, normal heart sounds and intact distal pulses.  Exam reveals no gallop and no friction rub.   No murmur heard. Pulmonary/Chest: Effort normal and breath sounds normal. No respiratory distress. She has no wheezes. She has no rales.  Abdominal: Soft. Bowel sounds are normal. She exhibits no distension and no mass. There is no tenderness.  Musculoskeletal: Normal range of  motion. She exhibits no edema and no tenderness.  Lymphadenopathy:    She has no cervical adenopathy.  Neurological: She is alert. Coordination normal.  Speech is clear, memory is intact, movements are coordinated, strength is normal in all 4 extremities, cranial nerves III through XII intact  Skin: Skin is warm and dry. No rash noted. No erythema.  Psychiatric: She has a normal mood and affect. Her behavior is normal.    ED Course  Procedures (including critical care time) Labs Review Labs Reviewed  CBC WITH DIFFERENTIAL - Abnormal; Notable for the following:    RBC 3.83 (*)    HCT 35.7 (*)    Neutrophils Relative % 78 (*)    All other components within normal limits  COMPREHENSIVE METABOLIC PANEL - Abnormal; Notable for the following:    Glucose, Bld 125 (*)    All other components within normal limits  SALICYLATE LEVEL - Abnormal; Notable for the following:    Salicylate Lvl 2.2 (*)    All other components within normal limits  URINE RAPID DRUG SCREEN (HOSP PERFORMED)  ACETAMINOPHEN LEVEL  PREGNANCY, URINE   Imaging Review Ct Head Wo Contrast  08/23/2013   CLINICAL DATA:  Chest tightness, frontal headache. The patient did 3 caffeine pills in the morning yesterday to help with the headache.  EXAM: CT HEAD WITHOUT CONTRAST  TECHNIQUE: Contiguous axial images were obtained from the base of the skull through the vertex without intravenous contrast.  COMPARISON:  06/19/2010.  FINDINGS: There is no evidence of mass effect, midline shift or extra-axial fluid collections. There is no evidence of a space-occupying lesion or intracranial hemorrhage. There is no evidence of a cortical-based area of acute infarction.  The ventricles and sulci are appropriate for the patient's age. The basal cisterns are patent.  Visualized portions of the orbits are unremarkable. The visualized portions of the paranasal sinuses and mastoid air cells are unremarkable.  The osseous structures are unremarkable.   IMPRESSION: No acute intracranial process.   Electronically Signed   By: Elige Ko   On: 08/23/2013 09:45   Dg Chest Port 1 View  08/23/2013   CLINICAL DATA:  Shortness of breath and nausea.  EXAM: PORTABLE CHEST - 1 VIEW  COMPARISON:  07/03/2011.  FINDINGS: The cardiac silhouette, mediastinal and hilar contours are within normal limits and stable. Suspect emphysematous changes with hyperinflation the. No acute pulmonary findings. No pleural effusion. The bony thorax is intact.  IMPRESSION: No acute cardiopulmonary findings.  Suspect emphysematous changes.   Electronically Signed   By: Loralie Champagne M.D.   On: 08/23/2013 07:38    EKG Interpretation     Ventricular Rate:  99 PR Interval:  140 QRS Duration: 85 QT  Interval:  381 QTC Calculation: 489 R Axis:   84 Text Interpretation:  Sinus rhythm Borderline prolonged QT interval            MDM   1. Headache    Though the patient complains of a headache she has normal vital signs and a benign neurologic exam. This didn't come on after using drugs last night, at this time she does not appear to be hypertensive febrile though she is borderline tachycardic. She will benefit from fluids, supportive care, we'll perform a CT scan to rule out other sources that may be induced by sympathomimetic-type drugs. We'll also obtain labs for coingestants in the setting of her drug use.  She states that she has not had symptoms like this in the past when she used ecstasy (only a couple of times)  Stable-appearing, CT scan negative, chest x-ray negative and labs unremarkable. The patient has improved with medications, headache is improved, this is unlikely a pathologic source. Stable for discharge  Meds given in ED:  Medications  sodium chloride 0.9 % bolus 1,000 mL (1,000 mLs Intravenous New Bag/Given 08/23/13 0740)  metoCLOPramide (REGLAN) injection 10 mg (10 mg Intravenous Given 08/23/13 0741)  diphenhydrAMINE (BENADRYL) injection 25 mg (25  mg Intravenous Given 08/23/13 0740)  acetaminophen (TYLENOL) tablet 650 mg (650 mg Oral Given 08/23/13 1116)    New Prescriptions   NAPROXEN (NAPROSYN) 500 MG TABLET    Take 1 tablet (500 mg total) by mouth 2 (two) times daily with a meal.      Vida Roller, MD 08/23/13 1139

## 2013-08-23 NOTE — ED Notes (Signed)
Patient transported to CT 

## 2013-11-06 ENCOUNTER — Other Ambulatory Visit: Payer: Self-pay | Admitting: Family

## 2013-11-06 ENCOUNTER — Ambulatory Visit
Admission: RE | Admit: 2013-11-06 | Discharge: 2013-11-06 | Disposition: A | Discharge status: Home / Self Care | Payer: Commercial Managed Care - HMO | Source: Ambulatory Visit | Attending: Family | Admitting: Family

## 2013-11-06 DIAGNOSIS — R059 Cough, unspecified: Secondary | ICD-10-CM

## 2013-11-06 DIAGNOSIS — R05 Cough: Secondary | ICD-10-CM

## 2013-11-06 DIAGNOSIS — R509 Fever, unspecified: Secondary | ICD-10-CM

## 2014-01-05 ENCOUNTER — Emergency Department (HOSPITAL_COMMUNITY): Payer: Medicare HMO

## 2014-01-05 ENCOUNTER — Encounter (HOSPITAL_COMMUNITY): Payer: Self-pay | Admitting: Emergency Medicine

## 2014-01-05 ENCOUNTER — Emergency Department (HOSPITAL_COMMUNITY)
Admission: EM | Admit: 2014-01-05 | Discharge: 2014-01-05 | Disposition: A | Discharge status: Home / Self Care | Payer: Medicare HMO | Attending: Emergency Medicine | Admitting: Emergency Medicine

## 2014-01-05 DIAGNOSIS — R0789 Other chest pain: Secondary | ICD-10-CM

## 2014-01-05 DIAGNOSIS — Z79899 Other long term (current) drug therapy: Secondary | ICD-10-CM | POA: Insufficient documentation

## 2014-01-05 DIAGNOSIS — Z8739 Personal history of other diseases of the musculoskeletal system and connective tissue: Secondary | ICD-10-CM | POA: Insufficient documentation

## 2014-01-05 DIAGNOSIS — R071 Chest pain on breathing: Secondary | ICD-10-CM | POA: Insufficient documentation

## 2014-01-05 DIAGNOSIS — F329 Major depressive disorder, single episode, unspecified: Secondary | ICD-10-CM | POA: Insufficient documentation

## 2014-01-05 DIAGNOSIS — F3289 Other specified depressive episodes: Secondary | ICD-10-CM | POA: Diagnosis not present

## 2014-01-05 DIAGNOSIS — R079 Chest pain, unspecified: Secondary | ICD-10-CM | POA: Diagnosis present

## 2014-01-05 DIAGNOSIS — Z8619 Personal history of other infectious and parasitic diseases: Secondary | ICD-10-CM | POA: Diagnosis not present

## 2014-01-05 HISTORY — DX: Whooping cough, unspecified species without pneumonia: A37.90

## 2014-01-05 LAB — CBC
HEMATOCRIT: 42.6 % (ref 36.0–46.0)
Hemoglobin: 14.8 g/dL (ref 12.0–15.0)
MCH: 33.4 pg (ref 26.0–34.0)
MCHC: 34.7 g/dL (ref 30.0–36.0)
MCV: 96.2 fL (ref 78.0–100.0)
Platelets: 275 10*3/uL (ref 150–400)
RBC: 4.43 MIL/uL (ref 3.87–5.11)
RDW: 12.8 % (ref 11.5–15.5)
WBC: 6.6 10*3/uL (ref 4.0–10.5)

## 2014-01-05 LAB — BASIC METABOLIC PANEL
BUN: 17 mg/dL (ref 6–23)
CO2: 23 meq/L (ref 19–32)
CREATININE: 0.76 mg/dL (ref 0.50–1.10)
Calcium: 9.7 mg/dL (ref 8.4–10.5)
Chloride: 103 mEq/L (ref 96–112)
GFR calc Af Amer: >90 mL/min (ref >90)
GFR calc non Af Amer: >90 mL/min (ref >90)
Glucose, Bld: 100 mg/dL — ABNORMAL HIGH (ref 70–99)
Potassium: 4.4 mEq/L (ref 3.7–5.3)
Sodium: 139 mEq/L (ref 137–147)

## 2014-01-05 LAB — I-STAT TROPONIN, ED
TROPONIN I, POC: 0 ng/mL (ref 0.00–0.08)
Troponin i, poc: 0 ng/mL (ref 0.00–0.08)

## 2014-01-05 LAB — D-DIMER, QUANTITATIVE: D-Dimer, Quant: <0.27 ug/mL-FEU (ref 0.00–0.48)

## 2014-01-05 MED ORDER — KETOROLAC TROMETHAMINE 30 MG/ML IJ SOLN
30.0000 mg | Freq: Once | INTRAMUSCULAR | Status: AC
  Administered 2014-01-05: 30 mg via INTRAVENOUS
  Filled 2014-01-05: qty 1

## 2014-01-05 MED ORDER — ONDANSETRON HCL 4 MG/2ML IJ SOLN
4.0000 mg | Freq: Once | INTRAMUSCULAR | Status: AC
  Administered 2014-01-05: 4 mg via INTRAVENOUS
  Filled 2014-01-05: qty 2

## 2014-01-05 NOTE — ED Provider Notes (Signed)
CSN: [id number]     Arrival date & time 01/05/14  1540 History   First MD Initiated Contact with Patient 01/05/14 1844     Chief Complaint  Patient presents with  . Chest Pain     (Consider location/radiation/quality/duration/timing/severity/associated sxs/prior Treatment) Patient is a 48 y.o. female presenting with chest pain. The history is provided by the patient.  Chest Pain Chest pain location: diffuse chest. Pain quality: dull and pressure   Pain radiates to:  Does not radiate Pain radiates to the back: no   Pain severity:  Mild Onset quality:  Gradual Duration:  2 days Timing:  Intermittent Progression:  Waxing and waning Chronicity:  New Context: at rest   Context: not breathing, not lifting, not raising an arm and no trauma   Relieved by:  Nothing Worsened by:  Nothing tried Ineffective treatments:  None tried Associated symptoms: shortness of breath (intermittent, none now)   Associated symptoms: no abdominal pain, no cough, no diaphoresis, no fever, no lower extremity edema, no nausea, no orthopnea, no syncope and not vomiting   Risk factors: no birth control, no coronary artery disease, no diabetes mellitus, no high cholesterol, no hypertension, not female, not obese, no prior DVT/PE and no smoking     Past Medical History  Diagnosis Date  . Depression   . DDD (degenerative disc disease)   . DDD (degenerative disc disease)   . DDD (degenerative disc disease)   . Pertussis    Past Surgical History  Procedure Laterality Date  . Cervical spine surgery    . Cesarean section    . Tubal ligation     History reviewed. No pertinent family history. History  Substance Use Topics  . Smoking status: Never Smoker   . Smokeless tobacco: Not on file  . Alcohol Use: No     Comment: quit drinking 7 months   OB History   Grav Para Term Preterm Abortions TAB SAB Ect Mult Living                 Review of Systems  Constitutional: Negative for fever, diaphoresis,  activity change and appetite change.  HENT: Negative for congestion and rhinorrhea.   Eyes: Negative for discharge, redness and itching.  Respiratory: Positive for shortness of breath (intermittent, none now). Negative for cough.   Cardiovascular: Positive for chest pain. Negative for orthopnea and syncope.  Gastrointestinal: Negative for nausea, vomiting and abdominal pain.  Genitourinary: Negative for decreased urine volume and difficulty urinating.  Skin: Negative for rash and wound.  Neurological: Negative for syncope.      Allergies  Review of patient's allergies indicates no known allergies.  Home Medications   Current Outpatient Rx  Name  Route  Sig  Dispense  Refill  . ALPRAZolam (XANAX) 0.5 MG tablet   Oral   Take 0.5 mg by mouth 3 (three) times daily as needed for sleep or anxiety.         Marland Kitchen amphetamine-dextroamphetamine (ADDERALL XR) 25 MG 24 hr capsule   Oral   Take 25 mg by mouth every morning.         . Aspirin-Salicylamide-Caffeine (BC HEADACHE POWDER PO)   Oral   Take 1 packet by mouth 3 (three) times daily as needed (pain).         Marland Kitchen escitalopram (LEXAPRO) 20 MG tablet   Oral   Take 20 mg by mouth daily.         Marland Kitchen oxyCODONE-acetaminophen (PERCOCET) 7.5-325 MG per tablet  Oral   Take 1 tablet by mouth every 6 (six) hours as needed for pain.          Marland Kitchen SALINE NA   Each Nare   Place 2 sprays into both nostrils daily as needed (allergies).         . traZODone (DESYREL) 100 MG tablet   Oral   Take 100 mg by mouth at bedtime.          BP 123/77  Pulse 98  Temp(Src) 98.3 F (36.8 C) (Oral)  Resp 24  SpO2 97%  LMP 01/04/2014 Physical Exam  Constitutional: She is oriented to person, place, and time. She appears well-developed and well-nourished. No distress.  HENT:  Head: Normocephalic and atraumatic.  Mouth/Throat: Oropharynx is clear and moist.  Eyes: Conjunctivae and EOM are normal. Pupils are equal, round, and reactive to light.  Right eye exhibits no discharge. Left eye exhibits no discharge. No scleral icterus.  Neck: Normal range of motion. Neck supple.  Cardiovascular: Normal rate, regular rhythm and intact distal pulses.  Exam reveals no gallop and no friction rub.   No murmur heard. Pulmonary/Chest: Effort normal and breath sounds normal. No respiratory distress. She has no wheezes. She has no rales. She exhibits tenderness (reproduction of chest pain on palpation).  Abdominal: Soft. She exhibits no distension and no mass. There is no tenderness.  Musculoskeletal: Normal range of motion.  No leg swelling or calf ttp  Neurological: She is alert and oriented to person, place, and time. No cranial nerve deficit. She exhibits normal muscle tone. Coordination normal.  Skin: She is not diaphoretic.    ED Course  Procedures (including critical care time) Labs Review Labs Reviewed  BASIC METABOLIC PANEL - Abnormal; Notable for the following:    Glucose, Bld 100 (*)    All other components within normal limits  CBC  D-DIMER, QUANTITATIVE  I-STAT TROPOININ, ED  Randolm Idol, ED   Imaging Review Dg Chest 2 View  01/05/2014   CLINICAL DATA:  Chest pain  EXAM: CHEST  2 VIEW  COMPARISON:  November 06, 2013  FINDINGS: The lungs are clear. The heart size and pulmonary vascularity are normal. No adenopathy. No pneumothorax. There is mild mid thoracic dextroscoliosis. There is postoperative change in the lower cervical spine region.  IMPRESSION: No edema or consolidation.   Electronically Signed   By: Lowella Grip M.D.   On: 01/05/2014 17:30     EKG Interpretation   Date/Time:  Friday January 05 2014 15:46:41 EDT Ventricular Rate:  115 PR Interval:  120 QRS Duration: 74 QT Interval:  336 QTC Calculation: 464 R Axis:   77 Text Interpretation:  Sinus tachycardia Right atrial enlargement  Borderline ECG Nonspecific T wave abnormality Confirmed by GOLDSTON  MD,  SCOTT ([id number]) on 01/05/2014 6:44:26 PM      MDM    MDM: 48 y.o WF w/ PMHx of DDD, Depression w/ cc: of chest pain x 2 days. Pt with atypical chest pain, intermittent SOB. No pleuritic chest pain. No risk factors for ACS, no smoking, no med hx, no fam hx. Atypical for PE, is tachycardic initially. Exam with chest ttp. EKG with no ischemic change. Pt low risk per HEART score,  Serial trops checked. Low risk by Rock Nephew, will get Dimer. Trops negative. Dimer negative. Pt feels better with toradol. CXR neg for PNA, PTX. Dimer neg, normal mediastinum makes dissection unlikely. Likely MSK pain. Will discharge home. Recommend NSAIDs. Follow up with PCP in 3 days  if not better. Discharged. Care of case d/w my attending.   Final diagnoses:  Chest wall pain   Discharged     Sol Passer, MD 01/05/14 2248

## 2014-01-05 NOTE — Discharge Instructions (Signed)

## 2014-01-05 NOTE — ED Notes (Signed)
Pt states she was treated for pertussis over the holidays and shes felt weak and tired since. She gained 10 lbs over past 2 weeks. She began to have heaviness in her chest yesterday so decided to come to ED for evaluation

## 2014-01-07 NOTE — ED Provider Notes (Signed)
I saw and evaluated the patient, reviewed the resident's note and I agree with the findings and plan.   EKG Interpretation   Date/Time:  Friday January 05 2014 15:46:41 EDT Ventricular Rate:  115 PR Interval:  120 QRS Duration: 74 QT Interval:  336 QTC Calculation: 464 R Axis:   77 Text Interpretation:  Sinus tachycardia Right atrial enlargement  Borderline ECG Nonspecific T wave abnormality Confirmed by Dotsie Gillette  MD,  Kensley Lares ([id number]) on 01/05/2014 6:44:26 PM      Patient with atypical CP. Low risk by HEART score. Pain improved here, appears MSK in nature (quite tender here). CXR normal. Delta troponins normal. Low risk for PE, with negative ddimer I feel it is ruled out. Stable for discharge, will follow up with her PCP for outpatient workup.  Ephraim Hamburger, MD 01/07/14 1149

## 2014-03-02 ENCOUNTER — Other Ambulatory Visit: Payer: Self-pay | Admitting: Family Medicine

## 2014-03-02 DIAGNOSIS — R1011 Right upper quadrant pain: Secondary | ICD-10-CM

## 2014-03-08 ENCOUNTER — Ambulatory Visit
Admission: RE | Admit: 2014-03-08 | Discharge: 2014-03-08 | Disposition: A | Discharge status: Home / Self Care | Payer: Commercial Managed Care - HMO | Source: Ambulatory Visit | Attending: Family Medicine | Admitting: Family Medicine

## 2014-03-08 DIAGNOSIS — R1011 Right upper quadrant pain: Secondary | ICD-10-CM

## 2014-04-16 ENCOUNTER — Other Ambulatory Visit (HOSPITAL_COMMUNITY): Payer: Self-pay | Admitting: Gastroenterology

## 2014-04-16 DIAGNOSIS — R1011 Right upper quadrant pain: Secondary | ICD-10-CM

## 2014-04-25 ENCOUNTER — Encounter (HOSPITAL_COMMUNITY)
Admission: RE | Admit: 2014-04-25 | Discharge: 2014-04-25 | Disposition: A | Discharge status: Home / Self Care | Payer: Medicare HMO | Source: Ambulatory Visit | Attending: Gastroenterology | Admitting: Gastroenterology

## 2014-04-25 DIAGNOSIS — R1011 Right upper quadrant pain: Secondary | ICD-10-CM

## 2014-04-25 MED ORDER — SINCALIDE 5 MCG IJ SOLR
0.0200 ug/kg | Freq: Once | INTRAMUSCULAR | Status: AC
  Administered 2014-04-25: 1.1 ug via INTRAVENOUS

## 2014-04-25 MED ORDER — TECHNETIUM TC 99M MEBROFENIN IV KIT
5.0000 | PACK | Freq: Once | INTRAVENOUS | Status: AC | PRN
  Administered 2014-04-25: 5 via INTRAVENOUS

## 2014-04-25 MED ORDER — SINCALIDE 5 MCG IJ SOLR
INTRAMUSCULAR | Status: AC
  Administered 2014-04-25: 1.1 ug via INTRAVENOUS
  Filled 2014-04-25: qty 5

## 2014-07-17 ENCOUNTER — Other Ambulatory Visit: Payer: Self-pay

## 2014-07-17 DIAGNOSIS — Z1231 Encounter for screening mammogram for malignant neoplasm of breast: Secondary | ICD-10-CM

## 2014-08-16 ENCOUNTER — Ambulatory Visit: Payer: Commercial Managed Care - HMO

## 2014-11-08 DIAGNOSIS — R05 Cough: Secondary | ICD-10-CM | POA: Diagnosis not present

## 2014-11-08 DIAGNOSIS — R5383 Other fatigue: Secondary | ICD-10-CM | POA: Diagnosis not present

## 2014-11-08 DIAGNOSIS — N898 Other specified noninflammatory disorders of vagina: Secondary | ICD-10-CM | POA: Diagnosis not present

## 2014-11-08 DIAGNOSIS — J209 Acute bronchitis, unspecified: Secondary | ICD-10-CM | POA: Diagnosis not present

## 2014-11-08 DIAGNOSIS — M5412 Radiculopathy, cervical region: Secondary | ICD-10-CM | POA: Diagnosis not present

## 2014-11-08 DIAGNOSIS — M47812 Spondylosis without myelopathy or radiculopathy, cervical region: Secondary | ICD-10-CM | POA: Diagnosis not present

## 2014-11-08 DIAGNOSIS — M25552 Pain in left hip: Secondary | ICD-10-CM | POA: Diagnosis not present

## 2014-11-08 DIAGNOSIS — R609 Edema, unspecified: Secondary | ICD-10-CM | POA: Diagnosis not present

## 2014-11-08 DIAGNOSIS — R42 Dizziness and giddiness: Secondary | ICD-10-CM | POA: Diagnosis not present

## 2014-11-08 DIAGNOSIS — J019 Acute sinusitis, unspecified: Secondary | ICD-10-CM | POA: Diagnosis not present

## 2014-11-09 ENCOUNTER — Other Ambulatory Visit: Payer: Self-pay | Admitting: Family Medicine

## 2014-11-09 ENCOUNTER — Ambulatory Visit
Admission: RE | Admit: 2014-11-09 | Discharge: 2014-11-09 | Disposition: A | Discharge status: Home / Self Care | Payer: Commercial Managed Care - HMO | Source: Ambulatory Visit | Attending: Family Medicine | Admitting: Family Medicine

## 2014-11-09 DIAGNOSIS — M25552 Pain in left hip: Secondary | ICD-10-CM

## 2014-11-09 DIAGNOSIS — R05 Cough: Secondary | ICD-10-CM | POA: Diagnosis not present

## 2014-11-09 DIAGNOSIS — R0602 Shortness of breath: Secondary | ICD-10-CM

## 2014-11-09 DIAGNOSIS — R059 Cough, unspecified: Secondary | ICD-10-CM

## 2014-12-04 DIAGNOSIS — H9190 Unspecified hearing loss, unspecified ear: Secondary | ICD-10-CM | POA: Diagnosis not present

## 2014-12-04 DIAGNOSIS — Z1239 Encounter for other screening for malignant neoplasm of breast: Secondary | ICD-10-CM | POA: Diagnosis not present

## 2014-12-04 DIAGNOSIS — Z0000 Encounter for general adult medical examination without abnormal findings: Secondary | ICD-10-CM | POA: Diagnosis not present

## 2014-12-04 DIAGNOSIS — Z202 Contact with and (suspected) exposure to infections with a predominantly sexual mode of transmission: Secondary | ICD-10-CM | POA: Diagnosis not present

## 2014-12-04 DIAGNOSIS — N949 Unspecified condition associated with female genital organs and menstrual cycle: Secondary | ICD-10-CM | POA: Diagnosis not present

## 2014-12-05 ENCOUNTER — Other Ambulatory Visit: Payer: Self-pay | Admitting: Family Medicine

## 2014-12-05 DIAGNOSIS — N83209 Unspecified ovarian cyst, unspecified side: Secondary | ICD-10-CM

## 2014-12-05 DIAGNOSIS — M5412 Radiculopathy, cervical region: Secondary | ICD-10-CM | POA: Diagnosis not present

## 2014-12-05 DIAGNOSIS — M47812 Spondylosis without myelopathy or radiculopathy, cervical region: Secondary | ICD-10-CM | POA: Diagnosis not present

## 2014-12-05 DIAGNOSIS — Z6825 Body mass index (BMI) 25.0-25.9, adult: Secondary | ICD-10-CM | POA: Diagnosis not present

## 2014-12-05 DIAGNOSIS — M5416 Radiculopathy, lumbar region: Secondary | ICD-10-CM | POA: Diagnosis not present

## 2014-12-05 DIAGNOSIS — M47816 Spondylosis without myelopathy or radiculopathy, lumbar region: Secondary | ICD-10-CM | POA: Diagnosis not present

## 2014-12-10 ENCOUNTER — Emergency Department (HOSPITAL_COMMUNITY)
Admission: EM | Admit: 2014-12-10 | Discharge: 2014-12-10 | Disposition: A | Discharge status: Home / Self Care | Payer: Commercial Managed Care - HMO | Attending: Emergency Medicine | Admitting: Emergency Medicine

## 2014-12-10 ENCOUNTER — Encounter (HOSPITAL_COMMUNITY): Payer: Self-pay | Admitting: Adult Health

## 2014-12-10 ENCOUNTER — Emergency Department (HOSPITAL_COMMUNITY): Payer: Commercial Managed Care - HMO

## 2014-12-10 DIAGNOSIS — Y9289 Other specified places as the place of occurrence of the external cause: Secondary | ICD-10-CM | POA: Insufficient documentation

## 2014-12-10 DIAGNOSIS — Z3202 Encounter for pregnancy test, result negative: Secondary | ICD-10-CM | POA: Diagnosis not present

## 2014-12-10 DIAGNOSIS — Z79899 Other long term (current) drug therapy: Secondary | ICD-10-CM | POA: Diagnosis not present

## 2014-12-10 DIAGNOSIS — Z8619 Personal history of other infectious and parasitic diseases: Secondary | ICD-10-CM | POA: Diagnosis not present

## 2014-12-10 DIAGNOSIS — S199XXA Unspecified injury of neck, initial encounter: Secondary | ICD-10-CM | POA: Insufficient documentation

## 2014-12-10 DIAGNOSIS — L03116 Cellulitis of left lower limb: Secondary | ICD-10-CM

## 2014-12-10 DIAGNOSIS — M542 Cervicalgia: Secondary | ICD-10-CM

## 2014-12-10 DIAGNOSIS — W000XXA Fall on same level due to ice and snow, initial encounter: Secondary | ICD-10-CM | POA: Diagnosis not present

## 2014-12-10 DIAGNOSIS — F329 Major depressive disorder, single episode, unspecified: Secondary | ICD-10-CM | POA: Insufficient documentation

## 2014-12-10 DIAGNOSIS — Y9389 Activity, other specified: Secondary | ICD-10-CM | POA: Insufficient documentation

## 2014-12-10 DIAGNOSIS — Y998 Other external cause status: Secondary | ICD-10-CM | POA: Diagnosis not present

## 2014-12-10 LAB — COMPREHENSIVE METABOLIC PANEL
ALT: 19 U/L (ref 0–35)
AST: 27 U/L (ref 0–37)
Albumin: 3.6 g/dL (ref 3.5–5.2)
Alkaline Phosphatase: 54 U/L (ref 39–117)
Anion gap: 6 (ref 5–15)
BUN: 10 mg/dL (ref 6–23)
CALCIUM: 8.8 mg/dL (ref 8.4–10.5)
CO2: 28 mmol/L (ref 19–32)
CREATININE: 0.84 mg/dL (ref 0.50–1.10)
Chloride: 104 mmol/L (ref 96–112)
GFR calc Af Amer: >90 mL/min (ref >90)
GFR calc non Af Amer: 80 mL/min — ABNORMAL LOW (ref >90)
GLUCOSE: 94 mg/dL (ref 70–99)
Potassium: 3.7 mmol/L (ref 3.5–5.1)
SODIUM: 138 mmol/L (ref 135–145)
Total Bilirubin: 0.5 mg/dL (ref 0.3–1.2)
Total Protein: 6.4 g/dL (ref 6.0–8.3)

## 2014-12-10 LAB — CBC WITH DIFFERENTIAL/PLATELET
BASOS PCT: 0 % (ref 0–1)
Basophils Absolute: 0 10*3/uL (ref 0.0–0.1)
EOS ABS: 0.5 10*3/uL (ref 0.0–0.7)
EOS PCT: 9 % — AB (ref 0–5)
HCT: 38.8 % (ref 36.0–46.0)
Hemoglobin: 13.1 g/dL (ref 12.0–15.0)
Lymphocytes Relative: 29 % (ref 12–46)
Lymphs Abs: 1.7 10*3/uL (ref 0.7–4.0)
MCH: 30.8 pg (ref 26.0–34.0)
MCHC: 33.8 g/dL (ref 30.0–36.0)
MCV: 91.3 fL (ref 78.0–100.0)
Monocytes Absolute: 0.4 10*3/uL (ref 0.1–1.0)
Monocytes Relative: 6 % (ref 3–12)
NEUTROS PCT: 56 % (ref 43–77)
Neutro Abs: 3.3 10*3/uL (ref 1.7–7.7)
PLATELETS: 208 10*3/uL (ref 150–400)
RBC: 4.25 MIL/uL (ref 3.87–5.11)
RDW: 12.3 % (ref 11.5–15.5)
WBC: 5.8 10*3/uL (ref 4.0–10.5)

## 2014-12-10 LAB — POC URINE PREG, ED: Preg Test, Ur: NEGATIVE

## 2014-12-10 MED ORDER — CLINDAMYCIN HCL 300 MG PO CAPS: 300.0000 mg | ORAL_CAPSULE | Freq: Three times a day (TID) | ORAL | Status: DC

## 2014-12-10 NOTE — ED Notes (Signed)
Pt verbalized understanding of discharge instructions and prescription of antibiotic and has no further questions.

## 2014-12-10 NOTE — ED Provider Notes (Signed)
CSN: [id number]     Arrival date & time 12/10/14  1700 History   None    Chief Complaint  Patient presents with  . Fall     (Consider location/radiation/quality/duration/timing/severity/associated sxs/prior Treatment) HPI Comments: 49 yo F hx of depression, DDD, presents with CC neck pain s/p fall.  Pt states she slipped on ice last night.  She states she fell straight on her back on concrete.  Pt did hit her head, but denies LOC.  Since the fall pt c/o of tingling bilateral neck, into bilaterally shoulders worse on the right.  Denies fevers, CP, SOB, cough, abdominal pain, nausea, vomiting, diarrhea, rash, any other myalgias, or any other symptoms.  Pt took oxycodone for her pain with some mild benefit.  Pt states she called her [facility], but was unable to get appointment today, and was told to come to ED for further evaluation.    The history is provided by the patient. No language interpreter was used.    Past Medical History  Diagnosis Date  . Depression   . DDD (degenerative disc disease)   . DDD (degenerative disc disease)   . DDD (degenerative disc disease)   . Pertussis    Past Surgical History  Procedure Laterality Date  . Cervical spine surgery    . Cesarean section    . Tubal ligation     History reviewed. No pertinent family history. History  Substance Use Topics  . Smoking status: Never Smoker   . Smokeless tobacco: Not on file  . Alcohol Use: No     Comment: quit drinking 7 months   OB History    No data available     Review of Systems  Constitutional: Negative for fever and chills.  Respiratory: Negative for cough and shortness of breath.   Cardiovascular: Negative for chest pain.  Gastrointestinal: Negative for nausea, abdominal pain, diarrhea and constipation.  Musculoskeletal: Positive for neck pain. Negative for myalgias and arthralgias.  Skin: Negative for rash.  Neurological: Negative for dizziness, weakness, light-headedness, numbness and headaches.   Hematological: Negative for adenopathy. Does not bruise/bleed easily.  All other systems reviewed and are negative.     Allergies  Review of patient's allergies indicates no known allergies.  Home Medications   Prior to Admission medications   Medication Sig Start Date End Date Taking? Authorizing Provider  ALPRAZolam Duanne Moron) 0.5 MG tablet Take 0.5 mg by mouth 3 (three) times daily as needed for sleep or anxiety.    Historical Provider, MD  amphetamine-dextroamphetamine (ADDERALL XR) 25 MG 24 hr capsule Take 25 mg by mouth every morning.    Historical Provider, MD  Aspirin-Salicylamide-Caffeine (BC HEADACHE POWDER PO) Take 1 packet by mouth 3 (three) times daily as needed (pain).    Historical Provider, MD  escitalopram (LEXAPRO) 20 MG tablet Take 20 mg by mouth daily.    Historical Provider, MD  oxyCODONE-acetaminophen (PERCOCET) 7.5-325 MG per tablet Take 1 tablet by mouth every 6 (six) hours as needed for pain.  08/02/13   Historical Provider, MD  SALINE NA Place 2 sprays into both nostrils daily as needed (allergies).    Historical Provider, MD  traZODone (DESYREL) 100 MG tablet Take 100 mg by mouth at bedtime.    Historical Provider, MD   BP 113/53 mmHg  Pulse 105  Temp(Src) 98.1 F (36.7 C) (Oral)  Resp 18  Ht 5\' 1"  (1.549 m)  Wt 134 lb (60.782 kg)  BMI 25.33 kg/m2  SpO2 97%  LMP 12/10/2014 (Exact  Date) Physical Exam  Constitutional: She is oriented to person, place, and time. She appears well-developed and well-nourished.  HENT:  Head: Normocephalic and atraumatic.  Right Ear: External ear normal.  Left Ear: External ear normal.  Mouth/Throat: Oropharynx is clear and moist.  Eyes: Conjunctivae and EOM are normal. Pupils are equal, round, and reactive to light.  Neck: Normal range of motion. Neck supple.  TTP bilateral paraspinal neck, trapezius.  No midline TTP or deformity.  Cardiovascular: Normal rate, regular rhythm, normal heart sounds and intact distal pulses.    Pulmonary/Chest: Effort normal and breath sounds normal. No respiratory distress. She has no wheezes. She has no rales. She exhibits no tenderness.  Abdominal: Soft. Bowel sounds are normal. She exhibits no distension and no mass. There is no tenderness. There is no rebound and no guarding.  Musculoskeletal: Normal range of motion. She exhibits tenderness.  Neurological: She is alert and oriented to person, place, and time.  CN II-XII intact.  No focal neurologic deficits present.  Motor and sensory globally intact in all four extremities.    Skin: Skin is warm and dry.  Erythema medial L foot, LLE, with increased warmth.  No skin breaks, fluctuance, or drainage.    Nursing note and vitals reviewed.   ED Course  Procedures (including critical care time) Labs Review Labs Reviewed  CBC WITH DIFFERENTIAL/PLATELET - Abnormal; Notable for the following:    Eosinophils Relative 9 (*)    All other components within normal limits  COMPREHENSIVE METABOLIC PANEL  POC URINE PREG, ED    Imaging Review Ct Cervical Spine Wo Contrast  12/10/2014   CLINICAL DATA:  Status post slip and fall on ice the evening of 12/09/2014. Neck pain. History of prior cervical surgery.  EXAM: CT CERVICAL SPINE WITHOUT CONTRAST  TECHNIQUE: Multidetector CT imaging of the cervical spine was performed without intravenous contrast. Multiplanar CT image reconstructions were also generated.  COMPARISON:  Postmyelogram CT scan 04/19/2013.  FINDINGS: No fracture or malalignment is identified. The patient is status post C4-7 fusion. The levels are solidly fused and hardware is intact. Loss of disc space height is seen at C2-3, C3-4 and C7-T1. Trace anterolisthesis C7 on T1 is noted. Imaged paraspinous tissues are unremarkable. The lung apices are clear.  IMPRESSION: No acute abnormality.  Status post C4-7 fusion without evidence of complication.  No change in the appearance of degenerative disc disease.   Electronically Signed   By:  Inge Rise M.D.   On: 12/10/2014 20:11     EKG Interpretation None      MDM   Final diagnoses:  None    49 yo F hx of depression, DDD, presents with CC neck pain s/p fall.   Physical exam as above.  VS WNL.  Pt with mild muscular TTP bilateral neck, trapezius, but no midline bony TTP.  No focal neurologic deficits on my exam.  CT cervical spine ordered which demonstrates no acute abnormality, s/p cervical fusion.  Pt also with early cellulitis on exam, without leukocytosis.    Diagnosis of cervical strain, and incidental finding of early cellulitis LLE.    Pt okay for d/c home at this time.  Rx Clindamycin for early cellulitis.  Pt has pain medicine at home for neck pain.  F/u with PCP in 1 week.  Return precautions given.  Pt understands and agrees with plan.   Sinda Du  I've discussed pt with my attending Dr. Vanita Panda.    Sinda Du, MD 12/11/14 [phone]  Carmin Muskrat, MD 12/12/14 [phone]

## 2014-12-10 NOTE — Discharge Instructions (Signed)
Cervical Sprain A cervical sprain is when the tissues (ligaments) that hold the neck bones in place stretch or tear. HOME CARE   Put ice on the injured area.  Put ice in a plastic bag.  Place a towel between your skin and the bag.  Leave the ice on for 15-20 minutes, 3-4 times a day.  You may have been given a collar to wear. This collar keeps your neck from moving while you heal.  Do not take the collar off unless told by your doctor.  If you have long hair, keep it outside of the collar.  Ask your doctor before changing the position of your collar. You may need to change its position over time to make it more comfortable.  If you are allowed to take off the collar for cleaning or bathing, follow your doctor's instructions on how to do it safely.  Keep your collar clean by wiping it with mild soap and water. Dry it completely. If the collar has removable pads, remove them every 1-2 days to hand wash them with soap and water. Allow them to air dry. They should be dry before you wear them in the collar.  Do not drive while wearing the collar.  Only take medicine as told by your doctor.  Keep all doctor visits as told.  Keep all physical therapy visits as told.  Adjust your work station so that you have good posture while you work.  Avoid positions and activities that make your problems worse.  Warm up and stretch before being active. GET HELP IF:  Your pain is not controlled with medicine.  You cannot take less pain medicine over time as planned.  Your activity level does not improve as expected. GET HELP RIGHT AWAY IF:   You are bleeding.  Your stomach is upset.  You have an allergic reaction to your medicine.  You develop new problems that you cannot explain.  You lose feeling (become numb) or you cannot move any part of your body (paralysis).  You have tingling or weakness in any part of your body.  Your symptoms get worse. Symptoms include:  Pain,  soreness, stiffness, puffiness (swelling), or a burning feeling in your neck.  Pain when your neck is touched.  Shoulder or upper back pain.  Limited ability to move your neck.  Headache.  Dizziness.  Your hands or arms feel week, lose feeling, or tingle.  Muscle spasms.  Difficulty swallowing or chewing. MAKE SURE YOU:   Understand these instructions.  Will watch your condition.  Will get help right away if you are not doing well or get worse. Document Released: 03/30/2008 Document Revised: 06/14/2013 Document Reviewed: 04/19/2013 [facility] Patient Information 2015 [place], [place]. This information is not intended to replace advice given to you by your health care provider. Make sure you discuss any questions you have with your health care provider.  Cellulitis Cellulitis is an infection of the skin and the tissue beneath it. The infected area is usually red and tender. Cellulitis occurs most often in the arms and lower legs.  CAUSES  Cellulitis is caused by bacteria that enter the skin through cracks or cuts in the skin. The most common types of bacteria that cause cellulitis are staphylococci and streptococci. SIGNS AND SYMPTOMS   Redness and warmth.  Swelling.  Tenderness or pain.  Fever. DIAGNOSIS  Your health care provider can usually determine what is wrong based on a physical exam. Blood tests may also be done. TREATMENT  Treatment usually involves taking an antibiotic medicine. HOME CARE INSTRUCTIONS   Take your antibiotic medicine as directed by your health care provider. Finish the antibiotic even if you start to feel better.  Keep the infected arm or leg elevated to reduce swelling.  Apply a warm cloth to the affected area up to 4 times per day to relieve pain.  Take medicines only as directed by your health care provider.  Keep all follow-up visits as directed by your health care provider. SEEK MEDICAL CARE IF:   You notice red streaks coming from  the infected area.  Your red area gets larger or turns dark in color.  Your bone or joint underneath the infected area becomes painful after the skin has healed.  Your infection returns in the same area or another area.  You notice a swollen bump in the infected area.  You develop new symptoms.  You have a fever. SEEK IMMEDIATE MEDICAL CARE IF:   You feel very sleepy.  You develop vomiting or diarrhea.  You have a general ill feeling (malaise) with muscle aches and pains. MAKE SURE YOU:   Understand these instructions.  Will watch your condition.  Will get help right away if you are not doing well or get worse. Document Released: 07/22/2005 Document Revised: 02/26/2014 Document Reviewed: 12/28/2011 [facility] Patient Information 2015 [place], [place]. This information is not intended to replace advice given to you by your health care provider. Make sure you discuss any questions you have with your health care provider.

## 2014-12-10 NOTE — ED Notes (Signed)
Went into room to check vitals, pt states that her BP always runs low.

## 2014-12-10 NOTE — ED Notes (Addendum)
Pt fell and slipped on the ice last night and now has neck pain. Pt is concerned because she has had 3 prior neck surgeries and has metal in her neck.   Pt also states that she has been having fluid build up in her lower extremities. Minor edema noted in ankles bilaterally. Pt stated that she has taken 2 of her lasix pills today.

## 2014-12-10 NOTE — ED Notes (Addendum)
Presents post fall from slip on ice last night, c/o upper to mid back pain, right shoulder pain and right arm tingling worse with taking a deep breath. She also endorses fluid build up in bilateral lower extremities over the last day. She is worried her kidneys may be failing and would like that checked.  Bilateral breath sounds clear, denies LOC, denies hitting head. Alert, oriented.

## 2014-12-11 DIAGNOSIS — Z0000 Encounter for general adult medical examination without abnormal findings: Secondary | ICD-10-CM | POA: Diagnosis not present

## 2014-12-12 DIAGNOSIS — H903 Sensorineural hearing loss, bilateral: Secondary | ICD-10-CM | POA: Diagnosis not present

## 2014-12-12 DIAGNOSIS — H9193 Unspecified hearing loss, bilateral: Secondary | ICD-10-CM | POA: Diagnosis not present

## 2014-12-13 ENCOUNTER — Ambulatory Visit: Payer: Commercial Managed Care - HMO

## 2014-12-24 ENCOUNTER — Ambulatory Visit
Admission: RE | Admit: 2014-12-24 | Discharge: 2014-12-24 | Disposition: A | Discharge status: Home / Self Care | Payer: Commercial Managed Care - HMO | Source: Ambulatory Visit

## 2014-12-24 DIAGNOSIS — Z1231 Encounter for screening mammogram for malignant neoplasm of breast: Secondary | ICD-10-CM | POA: Diagnosis not present

## 2014-12-29 ENCOUNTER — Emergency Department (HOSPITAL_COMMUNITY)
Admission: EM | Admit: 2014-12-29 | Discharge: 2014-12-29 | Disposition: A | Discharge status: Home / Self Care | Payer: Commercial Managed Care - HMO | Attending: Emergency Medicine | Admitting: Emergency Medicine

## 2014-12-29 ENCOUNTER — Emergency Department (HOSPITAL_COMMUNITY): Payer: Commercial Managed Care - HMO

## 2014-12-29 ENCOUNTER — Encounter (HOSPITAL_COMMUNITY): Payer: Self-pay | Admitting: Emergency Medicine

## 2014-12-29 DIAGNOSIS — Z792 Long term (current) use of antibiotics: Secondary | ICD-10-CM | POA: Insufficient documentation

## 2014-12-29 DIAGNOSIS — R569 Unspecified convulsions: Secondary | ICD-10-CM

## 2014-12-29 DIAGNOSIS — F319 Bipolar disorder, unspecified: Secondary | ICD-10-CM | POA: Insufficient documentation

## 2014-12-29 DIAGNOSIS — F1310 Sedative, hypnotic or anxiolytic abuse, uncomplicated: Secondary | ICD-10-CM | POA: Diagnosis not present

## 2014-12-29 DIAGNOSIS — G40909 Epilepsy, unspecified, not intractable, without status epilepticus: Secondary | ICD-10-CM | POA: Diagnosis not present

## 2014-12-29 DIAGNOSIS — Z8619 Personal history of other infectious and parasitic diseases: Secondary | ICD-10-CM | POA: Insufficient documentation

## 2014-12-29 DIAGNOSIS — R9431 Abnormal electrocardiogram [ECG] [EKG]: Secondary | ICD-10-CM | POA: Diagnosis not present

## 2014-12-29 DIAGNOSIS — G4489 Other headache syndrome: Secondary | ICD-10-CM | POA: Diagnosis not present

## 2014-12-29 DIAGNOSIS — R1110 Vomiting, unspecified: Secondary | ICD-10-CM | POA: Diagnosis not present

## 2014-12-29 DIAGNOSIS — F1510 Other stimulant abuse, uncomplicated: Secondary | ICD-10-CM | POA: Insufficient documentation

## 2014-12-29 DIAGNOSIS — Z79899 Other long term (current) drug therapy: Secondary | ICD-10-CM | POA: Insufficient documentation

## 2014-12-29 HISTORY — DX: Unspecified convulsions: R56.9

## 2014-12-29 LAB — CBC WITH DIFFERENTIAL/PLATELET
BASOS ABS: 0 10*3/uL (ref 0.0–0.1)
Basophils Relative: 1 % (ref 0–1)
Eosinophils Absolute: 0.3 10*3/uL (ref 0.0–0.7)
Eosinophils Relative: 4 % (ref 0–5)
HEMATOCRIT: 43.1 % (ref 36.0–46.0)
Hemoglobin: 14.8 g/dL (ref 12.0–15.0)
LYMPHS ABS: 2.6 10*3/uL (ref 0.7–4.0)
LYMPHS PCT: 37 % (ref 12–46)
MCH: 31.6 pg (ref 26.0–34.0)
MCHC: 34.3 g/dL (ref 30.0–36.0)
MCV: 91.9 fL (ref 78.0–100.0)
Monocytes Absolute: 0.4 10*3/uL (ref 0.1–1.0)
Monocytes Relative: 6 % (ref 3–12)
NEUTROS ABS: 3.7 10*3/uL (ref 1.7–7.7)
NEUTROS PCT: 52 % (ref 43–77)
PLATELETS: 188 10*3/uL (ref 150–400)
RBC: 4.69 MIL/uL (ref 3.87–5.11)
RDW: 12.5 % (ref 11.5–15.5)
WBC: 7 10*3/uL (ref 4.0–10.5)

## 2014-12-29 LAB — HEPATIC FUNCTION PANEL
ALK PHOS: 57 U/L (ref 39–117)
ALT: 18 U/L (ref 0–35)
AST: 27 U/L (ref 0–37)
Albumin: 4.1 g/dL (ref 3.5–5.2)
BILIRUBIN INDIRECT: 0.4 mg/dL (ref 0.3–0.9)
Bilirubin, Direct: 0.1 mg/dL (ref 0.0–0.5)
TOTAL PROTEIN: 6.6 g/dL (ref 6.0–8.3)
Total Bilirubin: 0.5 mg/dL (ref 0.3–1.2)

## 2014-12-29 LAB — RAPID URINE DRUG SCREEN, HOSP PERFORMED
Amphetamines: POSITIVE — AB
Barbiturates: NOT DETECTED
Benzodiazepines: POSITIVE — AB
COCAINE: NOT DETECTED
Opiates: NOT DETECTED
Tetrahydrocannabinol: NOT DETECTED

## 2014-12-29 LAB — URINALYSIS, ROUTINE W REFLEX MICROSCOPIC
Bilirubin Urine: NEGATIVE
Glucose, UA: NEGATIVE mg/dL
Hgb urine dipstick: NEGATIVE
Ketones, ur: NEGATIVE mg/dL
Leukocytes, UA: NEGATIVE
NITRITE: NEGATIVE
PH: 6.5 (ref 5.0–8.0)
Protein, ur: NEGATIVE mg/dL
SPECIFIC GRAVITY, URINE: 1.018 (ref 1.005–1.030)
Urobilinogen, UA: 0.2 mg/dL (ref 0.0–1.0)

## 2014-12-29 LAB — BASIC METABOLIC PANEL
Anion gap: 12 (ref 5–15)
BUN: 11 mg/dL (ref 6–23)
CHLORIDE: 105 mmol/L (ref 96–112)
CO2: 18 mmol/L — ABNORMAL LOW (ref 19–32)
Calcium: 9.4 mg/dL (ref 8.4–10.5)
Creatinine, Ser: 1.02 mg/dL (ref 0.50–1.10)
GFR calc Af Amer: 74 mL/min — ABNORMAL LOW (ref >90)
GFR, EST NON AFRICAN AMERICAN: 63 mL/min — AB (ref >90)
GLUCOSE: 133 mg/dL — AB (ref 70–99)
Potassium: 4.3 mmol/L (ref 3.5–5.1)
Sodium: 135 mmol/L (ref 135–145)

## 2014-12-29 LAB — ETHANOL

## 2014-12-29 MED ORDER — LEVETIRACETAM 500 MG PO TABS: 500.0000 mg | ORAL_TABLET | Freq: Two times a day (BID) | ORAL | Status: DC

## 2014-12-29 MED ORDER — ONDANSETRON HCL 4 MG/2ML IJ SOLN
4.0000 mg | Freq: Once | INTRAMUSCULAR | Status: AC
  Administered 2014-12-29: 4 mg via INTRAVENOUS
  Filled 2014-12-29: qty 2

## 2014-12-29 MED ORDER — SODIUM CHLORIDE 0.9 % IV BOLUS (SEPSIS)
1000.0000 mL | Freq: Once | INTRAVENOUS | Status: AC
  Administered 2014-12-29: 1000 mL via INTRAVENOUS

## 2014-12-29 MED ORDER — KETOROLAC TROMETHAMINE 30 MG/ML IJ SOLN
30.0000 mg | Freq: Once | INTRAMUSCULAR | Status: AC
  Administered 2014-12-29: 30 mg via INTRAVENOUS
  Filled 2014-12-29: qty 1

## 2014-12-29 MED ORDER — ONDANSETRON 4 MG PO TBDP
ORAL_TABLET | ORAL | Status: AC
  Filled 2014-12-29: qty 8

## 2014-12-29 MED ORDER — ONDANSETRON 4 MG PO TBDP
8.0000 mg | ORAL_TABLET | Freq: Once | ORAL | Status: AC
Start: 2014-12-29 — End: 2014-12-29
  Administered 2014-12-29: 8 mg via ORAL

## 2014-12-29 MED ORDER — LEVETIRACETAM IN NACL 1000 MG/100ML IV SOLN
1000.0000 mg | Freq: Once | INTRAVENOUS | Status: AC
  Administered 2014-12-29: 1000 mg via INTRAVENOUS
  Filled 2014-12-29: qty 100

## 2014-12-29 NOTE — Discharge Instructions (Signed)
Take Keppra as directed. Follow up with your doctor. Follow up with the recommended Neurologist for further evaluation. Refer to attached documents for more information.

## 2014-12-29 NOTE — ED Notes (Signed)
Pt up for discharge but still feeling nauseated.  Will give zofran and re-evaluate.

## 2014-12-29 NOTE — ED Notes (Signed)
PT was ambulated to bathroom with EMT.  Pt became very dizzy and needed to hold onto EMT during walk.  PA notified.

## 2014-12-29 NOTE — BH Assessment (Signed)
Assessment Note  [name] is an 49 y.o. female who reports she was brought to [facility] by her Spouse after she suffered some type of seizure.  The Patient presented drowsy, but coherent, orientated x4, denied current SI, HI, or any type of psychosis.  Patient reports she is misusing prescribed Oxycodone 10 mg.  She reports receiving a month's supple on 12-27-2014 and has currently used 16 pills.  She reports being diagnosed with adult ADHD and being prescribed Adderall 25 mg 1x per day, however she is taking more than 1 tablet per day.  The Patient reports getting Xanax from a Friend and using 3 to 4 .5 mg tablet "every few days."  Patient reports a pain clinic prescribes her medication and prior to that she was being seen at [facility].  She denied having any other mental health diagnosis except the ADHD.  She reports being in a 28 day and a 30 day substance use residential treatment in [place], [place] 4 years ago.  She also reports attending substance use IOP at the [facility] in 2006.  Patient reports currently wanting detox from opiates.  She reports wanting to talk with her Spouse after discharge about going into a residential treatment program.         Axis I: Opioid Use Disorder, moderate, Stimulant Use Disorder, moderate,, Anxiolytic Use Disorder, mild Axis II: Deferred Axis IV: other psychosocial or environmental problems Axis V: 51-60 moderate symptoms  Past Medical History:  Past Medical History  Diagnosis Date  . Depression   . DDD (degenerative disc disease)   . DDD (degenerative disc disease)   . DDD (degenerative disc disease)   . Pertussis   . Seizures     Past Surgical History  Procedure Laterality Date  . Cervical spine surgery    . Cesarean section    . Tubal ligation      Family History: History reviewed. No pertinent family history.  Social History:  reports that she has never smoked. She does not have any smokeless tobacco history on file. She reports  that she does not drink alcohol or use illicit drugs.  Additional Social History:     CIWA: CIWA-Ar BP: 112/71 mmHg Pulse Rate: 78 COWS:    Allergies: No Known Allergies  Home Medications:  (Not in a hospital admission)  OB/GYN Status:  Patient's last menstrual period was 12/09/2014.  General Assessment Data Location of Assessment: [facility] ED ACT Assessment: Yes Is this a Tele or Face-to-Face Assessment?: Tele Assessment Is this an Initial Assessment or a Re-assessment for this encounter?: Initial Assessment Living Arrangements: Spouse/significant other, Children Can pt return to current living arrangement?: Yes Admission Status: Voluntary Is patient capable of signing voluntary admission?: Yes Transfer from: Home Referral Source: Self/Family/Friend  Medical Screening Exam ([place]) Medical Exam completed: Yes  [place] Living Arrangements: Spouse/significant other, Children Name of Psychiatrist: None Name of Therapist: None  Education Status Is patient currently in school?: No Current Grade: N/A Highest grade of school patient has completed: N/A Name of school: N/A Contact person: N/A  Risk to self with the past 6 months Suicidal Ideation: No Suicidal Intent: No Is patient at risk for suicide?: No Suicidal Plan?: No Access to Means: No What has been your use of drugs/alcohol within the last 12 months?: Misuse of prescribed opiate, Adderall, and getting Xanax from Friend Previous Attempts/Gestures: No How many times?: 0 Other Self Harm Risks: None Triggers for Past Attempts: None known Intentional Self  Injurious Behavior: None Family Suicide History: Unknown Recent stressful life event(s): Other (Comment) (Current substaance use) Persecutory voices/beliefs?: No Depression: No Depression Symptoms:  (None) Substance abuse history and/or treatment for substance abuse?: Yes (opiates, Benzos) Suicide prevention information given to non-admitted  patients: Not applicable  Risk to Others within the past 6 months Homicidal Ideation: No Thoughts of Harm to Others: No Current Homicidal Intent: No Current Homicidal Plan: No Access to Homicidal Means: No Identified Victim: N/A History of harm to others?: No Assessment of Violence: None Noted Violent Behavior Description: None Does patient have access to weapons?: No Criminal Charges Pending?: No Does patient have a court date: No  Psychosis Hallucinations: None noted Delusions: None noted  Mental Status Report Appear/Hygiene: In hospital gown Eye Contact: Poor Motor Activity: Restlessness Speech: Slow, Soft Level of Consciousness: Drowsy Mood: Anxious Affect: Anxious Anxiety Level: Moderate Thought Processes: Coherent, Relevant Judgement: Partial Orientation: Person, Place, Time, Situation Obsessive Compulsive Thoughts/Behaviors: None  Cognitive Functioning Concentration: Decreased Memory: Recent Intact, Remote Intact IQ: Average Insight: Poor Impulse Control: Poor Appetite: Fair Weight Loss: 0 Weight Gain: 0 Sleep: No Change Total Hours of Sleep: 6 Vegetative Symptoms: None  ADLScreening [facility] Assessment Services) Patient's cognitive ability adequate to safely complete daily activities?: Yes Patient able to express need for assistance with ADLs?: Yes Independently performs ADLs?: Yes (appropriate for developmental age)  Prior Inpatient Therapy Prior Inpatient Therapy: Yes (Substance use residential treatment 28 days) Prior Therapy Dates: 2012 Prior Therapy Facilty/Provider(s): [facility] Reason for Treatment: Substance abuse  Prior Outpatient Therapy Prior Outpatient Therapy: Yes Prior Therapy Dates: 2006 Prior Therapy Facilty/Provider(s): [facility] (substance use IOP) Reason for Treatment: substance abuse  ADL Screening (condition at time of admission) Patient's cognitive ability adequate to safely complete daily activities?:  Yes Is the patient deaf or have difficulty hearing?: No Does the patient have difficulty seeing, even when wearing glasses/contacts?: No Does the patient have difficulty concentrating, remembering, or making decisions?: No Patient able to express need for assistance with ADLs?: Yes Independently performs ADLs?: Yes (appropriate for developmental age) Does the patient have difficulty walking or climbing stairs?: No Weakness of Legs: None Weakness of Arms/Hands: None  Home Assistive Devices/Equipment Home Assistive Devices/Equipment: None    Abuse/Neglect Assessment (Assessment to be complete while patient is alone) Physical Abuse: Denies Verbal Abuse: Denies Sexual Abuse: Denies Exploitation of patient/patient's resources: Denies Self-Neglect: Denies     Regulatory affairs officer (For Healthcare) Does patient have an advance directive?: No    Additional Information 1:1 In Past 12 Months?: No CIRT Risk: No Elopement Risk: No Does patient have medical clearance?: Yes     Disposition:  Disposition Initial Assessment Completed for this Encounter: Yes Disposition of Patient: Other dispositions Other disposition(s): Other (Comment)  On Site Evaluation by:   Reviewed with Physician:    Dey-Johnson,Mashonda Broski 12/29/2014 1:17 PM

## 2014-12-29 NOTE — ED Provider Notes (Signed)
CSN: [id number]     Arrival date & time 12/29/14  0603 History   First MD Initiated Contact with Patient 12/29/14 [phone]     Chief Complaint  Patient presents with  . Seizures  . Emesis     (Consider location/radiation/quality/duration/timing/severity/associated sxs/prior Treatment) HPI Comments: Patient is a 49 year old female with a past medical history of bipolar disorder, alcoholism, and seizure disorder who presents after having a seizure in her sleep last night. Patient presents with her husband who provides the history. He states the patient started having generalized shaking in the middle of the night that woke him from sleeping. He reports the seizure lasting about 5 minutes. During this time, she was incontinent of stool and urine. The husband reports a period of confusion after the seizure. Patient now complains of a severe headache. She is unsure of head trauma. She does report slipping on the ice 3 weeks ago and hitting th back of her head. She reports associated nausea. No aggravating/alleviating factors.    Past Medical History  Diagnosis Date  . Depression   . DDD (degenerative disc disease)   . DDD (degenerative disc disease)   . DDD (degenerative disc disease)   . Pertussis    Past Surgical History  Procedure Laterality Date  . Cervical spine surgery    . Cesarean section    . Tubal ligation     History reviewed. No pertinent family history. History  Substance Use Topics  . Smoking status: Never Smoker   . Smokeless tobacco: Not on file  . Alcohol Use: No     Comment: quit drinking 7 months   OB History    No data available     Review of Systems  Constitutional: Negative for fever, chills and fatigue.  HENT: Negative for trouble swallowing.   Eyes: Negative for visual disturbance.  Respiratory: Negative for shortness of breath.   Cardiovascular: Negative for chest pain and palpitations.  Gastrointestinal: Negative for nausea, vomiting, abdominal pain and  diarrhea.  Genitourinary: Negative for dysuria and difficulty urinating.  Musculoskeletal: Negative for arthralgias and neck pain.  Skin: Negative for color change.  Neurological: Positive for seizures. Negative for dizziness and weakness.  Psychiatric/Behavioral: Negative for dysphoric mood.      Allergies  Review of patient's allergies indicates no known allergies.  Home Medications   Prior to Admission medications   Medication Sig Start Date End Date Taking? Authorizing Provider  ALPRAZolam Duanne Moron) 0.5 MG tablet Take 0.5 mg by mouth 3 (three) times daily as needed for sleep or anxiety.    Historical Provider, MD  amphetamine-dextroamphetamine (ADDERALL XR) 25 MG 24 hr capsule Take 25 mg by mouth every morning.    Historical Provider, MD  Aspirin-Salicylamide-Caffeine (BC HEADACHE POWDER PO) Take 1 packet by mouth 3 (three) times daily as needed (pain).    Historical Provider, MD  clindamycin (CLEOCIN) 300 MG capsule Take 1 capsule (300 mg total) by mouth 3 (three) times daily. 12/10/14   Sinda Du, MD  escitalopram (LEXAPRO) 20 MG tablet Take 20 mg by mouth daily.    Historical Provider, MD  oxyCODONE-acetaminophen (PERCOCET) 7.5-325 MG per tablet Take 1 tablet by mouth every 6 (six) hours as needed for pain.  08/02/13   Historical Provider, MD  SALINE NA Place 2 sprays into both nostrils daily as needed (allergies).    Historical Provider, MD  traZODone (DESYREL) 100 MG tablet Take 100 mg by mouth at bedtime.    Historical Provider, MD  BP 114/71 mmHg  Temp(Src) 98.4 F (36.9 C) (Oral)  Resp 22  Ht 5\' 1"  (1.549 m)  Wt 130 lb (58.968 kg)  BMI 24.58 kg/m2  SpO2 99%  LMP 12/10/2014 (Exact Date) Physical Exam  Constitutional: She is oriented to person, place, and time. She appears well-developed and well-nourished. No distress.  HENT:  Head: Normocephalic and atraumatic.  Mouth/Throat: Oropharynx is clear and moist. No oropharyngeal exudate.  Adentulous.   Eyes: Conjunctivae  and EOM are normal.  Neck: Normal range of motion.  Cardiovascular: Normal rate and regular rhythm.  Exam reveals no gallop and no friction rub.   No murmur heard. Pulmonary/Chest: Effort normal and breath sounds normal. She has no wheezes. She has no rales. She exhibits no tenderness.  Abdominal: Soft. She exhibits no distension. There is no tenderness. There is no rebound.  Musculoskeletal: Normal range of motion.  Neurological: She is alert and oriented to person, place, and time. No cranial nerve deficit. Coordination normal.  Extremity strength and sensation equal and intact bilaterally. Speech is goal-oriented. Moves limbs without ataxia.   Skin: Skin is warm and dry.  Psychiatric: She has a normal mood and affect. Her behavior is normal.  Nursing note and vitals reviewed.   ED Course  Procedures (including critical care time) Labs Review Labs Reviewed  BASIC METABOLIC PANEL - Abnormal; Notable for the following:    CO2 18 (*)    Glucose, Bld 133 (*)    GFR calc non Af Amer 63 (*)    GFR calc Af Amer 74 (*)    All other components within normal limits  URINE RAPID DRUG SCREEN (HOSP PERFORMED) - Abnormal; Notable for the following:    Benzodiazepines POSITIVE (*)    Amphetamines POSITIVE (*)    All other components within normal limits  CBC WITH DIFFERENTIAL/PLATELET  URINALYSIS, ROUTINE W REFLEX MICROSCOPIC  ETHANOL  HEPATIC FUNCTION PANEL    Imaging Review Ct Head Wo Contrast  12/29/2014   CLINICAL DATA:  Seizures and emesis  EXAM: CT HEAD WITHOUT CONTRAST  TECHNIQUE: Contiguous axial images were obtained from the base of the skull through the vertex without intravenous contrast.  COMPARISON:  08/23/2013  FINDINGS: Skull and Sinuses:Negative for fracture or destructive process. The mastoids, middle ears, and imaged paranasal sinuses are clear.  Orbits: No acute abnormality.  Brain: No evidence of acute infarction, hemorrhage, hydrocephalus, or mass lesion/mass effect. No  cortical findings to explain seizure.  IMPRESSION: Negative head CT.   Electronically Signed   By: Monte Fantasia M.D.   On: 12/29/2014 07:30     EKG Interpretation None      MDM   Final diagnoses:  Seizure  Other headache syndrome    6:37 AM Labs, urinalysis and CT head pending. Vitals stable and patient afebrile. Patient is neurologically intact at this time.   8:50 AM Labs and CT head unremarkable for acute changes. I spoke with Dr. Leonel Ramsay who recommends 1000mg  Keppra loading dose and initiating 500mg  Keppra BID for seizures. Patient will have recommended Neurology follow up.   10:54 AM Patient's UDS shows benzos and amphetamines per prescriptions. Urinalysis unremarkable for acute changes. Patient will be discharged as planned.   Alvina Chou, PA-C 12/29/14 [place], MD 12/29/14 1346

## 2014-12-29 NOTE — ED Notes (Signed)
Per pt's husband, the pt was having seizure-like activity in her sleep. He states that the pt was having a "grand-mal seizure", stating that she was clenched up, shaking, and that her lips were turning blue. Pt's husband states that the pt would not respond to him during this episode, and only became coherent until they were at the hospital. Pt actively vomiting when she arrived to the room. 40md ODT zofran given. Pt in NAD.

## 2014-12-29 NOTE — ED Notes (Signed)
PT speaking with tts

## 2014-12-29 NOTE — ED Notes (Signed)
Pt reports hx of seizures, but states that she does not take medication for them. Pt's husband states that the pt started complaining of nausea and headache 2 days go.

## 2014-12-29 NOTE — BH Assessment (Signed)
1229:  Consulted with Alvina Chou, PA-C about the Patient.  PA-C Szekalski reports the Patient was admitted because of seizures.  She reports the Patient also reports using Xanax and Adderall and is wanting detox and residential treatment.    1235:  Scheduled tele-assessment.  1304:  Completed tele-assessment.    1332:  Consulted Catalina Pizza, NP about Patient.  Per Catalina Pizza, NP:  Patient does not meet inpatient criteria, provide outpatient resources after the Patient is medically cleared for discharge.   1338:  PA-C Alvina Chou was informed of the Patient's disposition.  She request outpatient substance use resources be faxed for the Patient.    1347:  Faxed residential substance use treatment resources to Nurse Hassan Rowan.

## 2014-12-31 DIAGNOSIS — H9193 Unspecified hearing loss, bilateral: Secondary | ICD-10-CM | POA: Diagnosis not present

## 2014-12-31 DIAGNOSIS — E039 Hypothyroidism, unspecified: Secondary | ICD-10-CM | POA: Diagnosis not present

## 2014-12-31 DIAGNOSIS — F1020 Alcohol dependence, uncomplicated: Secondary | ICD-10-CM | POA: Diagnosis not present

## 2014-12-31 DIAGNOSIS — F1124 Opioid dependence with opioid-induced mood disorder: Secondary | ICD-10-CM | POA: Diagnosis not present

## 2014-12-31 DIAGNOSIS — R112 Nausea with vomiting, unspecified: Secondary | ICD-10-CM | POA: Diagnosis not present

## 2014-12-31 DIAGNOSIS — G40909 Epilepsy, unspecified, not intractable, without status epilepticus: Secondary | ICD-10-CM | POA: Diagnosis not present

## 2014-12-31 DIAGNOSIS — F1320 Sedative, hypnotic or anxiolytic dependence, uncomplicated: Secondary | ICD-10-CM | POA: Diagnosis not present

## 2014-12-31 DIAGNOSIS — T40695A Adverse effect of other narcotics, initial encounter: Secondary | ICD-10-CM | POA: Diagnosis not present

## 2014-12-31 DIAGNOSIS — F1420 Cocaine dependence, uncomplicated: Secondary | ICD-10-CM | POA: Diagnosis not present

## 2014-12-31 DIAGNOSIS — M1990 Unspecified osteoarthritis, unspecified site: Secondary | ICD-10-CM | POA: Diagnosis not present

## 2015-01-07 DIAGNOSIS — H9193 Unspecified hearing loss, bilateral: Secondary | ICD-10-CM | POA: Diagnosis not present

## 2015-01-25 DIAGNOSIS — F902 Attention-deficit hyperactivity disorder, combined type: Secondary | ICD-10-CM | POA: Diagnosis not present

## 2015-01-25 DIAGNOSIS — F39 Unspecified mood [affective] disorder: Secondary | ICD-10-CM | POA: Diagnosis not present

## 2015-02-04 ENCOUNTER — Emergency Department (HOSPITAL_COMMUNITY)
Admission: EM | Admit: 2015-02-04 | Discharge: 2015-02-04 | Disposition: A | Discharge status: Home / Self Care | Payer: Commercial Managed Care - HMO | Attending: Emergency Medicine | Admitting: Emergency Medicine

## 2015-02-04 ENCOUNTER — Encounter (HOSPITAL_COMMUNITY): Payer: Self-pay | Admitting: Emergency Medicine

## 2015-02-04 DIAGNOSIS — Z79899 Other long term (current) drug therapy: Secondary | ICD-10-CM | POA: Diagnosis not present

## 2015-02-04 DIAGNOSIS — Y9289 Other specified places as the place of occurrence of the external cause: Secondary | ICD-10-CM | POA: Diagnosis not present

## 2015-02-04 DIAGNOSIS — X58XXXA Exposure to other specified factors, initial encounter: Secondary | ICD-10-CM | POA: Diagnosis not present

## 2015-02-04 DIAGNOSIS — Z792 Long term (current) use of antibiotics: Secondary | ICD-10-CM | POA: Insufficient documentation

## 2015-02-04 DIAGNOSIS — T782XXA Anaphylactic shock, unspecified, initial encounter: Secondary | ICD-10-CM

## 2015-02-04 DIAGNOSIS — L500 Allergic urticaria: Secondary | ICD-10-CM | POA: Diagnosis not present

## 2015-02-04 DIAGNOSIS — F329 Major depressive disorder, single episode, unspecified: Secondary | ICD-10-CM | POA: Insufficient documentation

## 2015-02-04 DIAGNOSIS — G40909 Epilepsy, unspecified, not intractable, without status epilepticus: Secondary | ICD-10-CM | POA: Diagnosis not present

## 2015-02-04 DIAGNOSIS — Z8739 Personal history of other diseases of the musculoskeletal system and connective tissue: Secondary | ICD-10-CM | POA: Diagnosis not present

## 2015-02-04 DIAGNOSIS — T7840XA Allergy, unspecified, initial encounter: Secondary | ICD-10-CM

## 2015-02-04 DIAGNOSIS — Y9389 Activity, other specified: Secondary | ICD-10-CM | POA: Diagnosis not present

## 2015-02-04 DIAGNOSIS — R079 Chest pain, unspecified: Secondary | ICD-10-CM | POA: Diagnosis present

## 2015-02-04 DIAGNOSIS — Y998 Other external cause status: Secondary | ICD-10-CM | POA: Diagnosis not present

## 2015-02-04 MED ORDER — METHYLPREDNISOLONE SODIUM SUCC 125 MG IJ SOLR
125.0000 mg | Freq: Once | INTRAMUSCULAR | Status: AC
  Administered 2015-02-04: 125 mg via INTRAVENOUS
  Filled 2015-02-04: qty 2

## 2015-02-04 MED ORDER — FAMOTIDINE IN NACL 20-0.9 MG/50ML-% IV SOLN
20.0000 mg | Freq: Once | INTRAVENOUS | Status: AC
  Administered 2015-02-04: 20 mg via INTRAVENOUS
  Filled 2015-02-04: qty 50

## 2015-02-04 MED ORDER — PREDNISONE 10 MG PO TABS: 60.0000 mg | ORAL_TABLET | Freq: Every day | ORAL | Status: DC

## 2015-02-04 MED ORDER — FAMOTIDINE 20 MG PO TABS: 20.0000 mg | ORAL_TABLET | Freq: Two times a day (BID) | ORAL | Status: DC

## 2015-02-04 MED ORDER — EPINEPHRINE 0.3 MG/0.3ML IJ SOAJ
0.3000 mg | Freq: Once | INTRAMUSCULAR | Status: AC
  Administered 2015-02-04: 0.3 mg via INTRAMUSCULAR
  Filled 2015-02-04 (×2): qty 0.3

## 2015-02-04 MED ORDER — DIPHENHYDRAMINE HCL 25 MG PO TABS: 25.0000 mg | ORAL_TABLET | Freq: Four times a day (QID) | ORAL | Status: DC

## 2015-02-04 MED ORDER — EPINEPHRINE 0.3 MG/0.3ML IJ SOAJ: 0.3000 mg | Freq: Once | INTRAMUSCULAR | Status: DC

## 2015-02-04 MED ORDER — DIPHENHYDRAMINE HCL 50 MG/ML IJ SOLN
50.0000 mg | Freq: Once | INTRAMUSCULAR | Status: AC
  Administered 2015-02-04: 50 mg via INTRAVENOUS
  Filled 2015-02-04: qty 1

## 2015-02-04 MED ORDER — SODIUM CHLORIDE 0.9 % IV BOLUS (SEPSIS)
1000.0000 mL | Freq: Once | INTRAVENOUS | Status: AC
  Administered 2015-02-04: 1000 mL via INTRAVENOUS

## 2015-02-04 NOTE — ED Provider Notes (Signed)
CSN: [id number]     Arrival date & time 02/04/15  1100 History   First MD Initiated Contact with Patient 02/04/15 1133     Chief Complaint  Patient presents with  . Angioedema     HPI Patient presents to the emergency department with lip swelling and tightness in the chest as well as diffuse hives to her abdomen.  She took a Claritin prior to arrival.  No prior history of allergic reactions.  She's never had issues with shellfish.  She denies difficulty breathing but does notice that her right lower lip is swollen.  She also reports that her voice sounds slightly abnormal to her.  No lightheadedness or syncope.  Symptoms are mild to moderate in severity.  Past Medical History  Diagnosis Date  . Depression   . DDD (degenerative disc disease)   . DDD (degenerative disc disease)   . DDD (degenerative disc disease)   . Pertussis   . Seizures    Past Surgical History  Procedure Laterality Date  . Cervical spine surgery    . Cesarean section    . Tubal ligation     History reviewed. No pertinent family history. History  Substance Use Topics  . Smoking status: Never Smoker   . Smokeless tobacco: Not on file  . Alcohol Use: No     Comment: quit drinking 7 months   OB History    No data available     Review of Systems  All other systems reviewed and are negative.     Allergies  Shrimp  Home Medications   Prior to Admission medications   Medication Sig Start Date End Date Taking? Authorizing Provider  DULoxetine (CYMBALTA) 30 MG capsule Take 30 mg by mouth 2 (two) times daily.   Yes Historical Provider, MD  escitalopram (LEXAPRO) 20 MG tablet Take 20 mg by mouth daily.   Yes Historical Provider, MD  furosemide (LASIX) 20 MG tablet Take 20 mg by mouth daily as needed for fluid.   Yes Historical Provider, MD  levothyroxine (SYNTHROID, LEVOTHROID) 25 MCG tablet Take 25 mcg by mouth daily before breakfast.   Yes Historical Provider, MD  potassium chloride (K-DUR) 10 MEQ  tablet Take 10 mEq by mouth daily as needed (with furosemide).   Yes Historical Provider, MD  pregabalin (LYRICA) 50 MG capsule Take 50 mg by mouth 3 (three) times daily.   Yes Historical Provider, MD  traZODone (DESYREL) 100 MG tablet Take 100 mg by mouth at bedtime as needed for sleep.    Yes Historical Provider, MD  trimethoprim (TRIMPEX) 100 MG tablet Take 100 mg by mouth daily.   Yes Historical Provider, MD  valACYclovir (VALTREX) 500 MG tablet Take 500 mg by mouth daily.   Yes Historical Provider, MD  acetaminophen (TYLENOL) 325 MG tablet Take 650 mg by mouth every 6 (six) hours as needed for mild pain.    Historical Provider, MD  ALPRAZolam Duanne Moron) 0.5 MG tablet Take 0.5 mg by mouth 3 (three) times daily as needed for sleep or anxiety.    Historical Provider, MD  amphetamine-dextroamphetamine (ADDERALL XR) 25 MG 24 hr capsule Take 25 mg by mouth every morning.    Historical Provider, MD  Aspirin-Salicylamide-Caffeine (BC HEADACHE POWDER PO) Take 1 packet by mouth 3 (three) times daily as needed (pain).    Historical Provider, MD  clindamycin (CLEOCIN) 300 MG capsule Take 1 capsule (300 mg total) by mouth 3 (three) times daily. Patient not taking: Reported on 12/29/2014 12/10/14  Sinda Du, MD  levETIRAcetam (KEPPRA) 500 MG tablet Take 1 tablet (500 mg total) by mouth 2 (two) times daily. Patient not taking: Reported on 02/04/2015 12/29/14   Alvina Chou, PA-C  oxyCODONE-acetaminophen (PERCOCET) 7.5-325 MG per tablet Take 1 tablet by mouth every 6 (six) hours as needed for pain.  08/02/13   Historical Provider, MD  SALINE NA Place 2 sprays into both nostrils daily as needed (allergies).    Historical Provider, MD   BP 96/55 mmHg  Pulse 80  Temp(Src) 98.5 F (36.9 C) (Oral)  Resp 18  SpO2 100% Physical Exam  Constitutional: She is oriented to person, place, and time. She appears well-developed and well-nourished. No distress.  HENT:  Head: Normocephalic and atraumatic.  Mild right lower  lip swelling.  No swelling of her tongue.  Tolerating secretions.  Oral airway is patent.  No stridor present.  Eyes: EOM are normal.  Neck: Normal range of motion.  Cardiovascular: Normal rate, regular rhythm and normal heart sounds.   Pulmonary/Chest: Effort normal and breath sounds normal. No stridor. No respiratory distress. She has no wheezes.  Abdominal: Soft. She exhibits no distension. There is no tenderness.  Musculoskeletal: Normal range of motion.  Neurological: She is alert and oriented to person, place, and time.  Skin: Skin is warm and dry.  Diffuse hives throughout her chest abdomen and legs  Psychiatric: She has a normal mood and affect. Judgment normal.  Nursing note and vitals reviewed.   ED Course  Procedures (including critical care time)  CRITICAL CARE Performed by: Hoy Morn Total critical care time: 32 Critical care time was exclusive of separately billable procedures and treating other patients. Critical care was necessary to treat or prevent imminent or life-threatening deterioration. Critical care was time spent personally by me on the following activities: development of treatment plan with patient and/or surrogate as well as nursing, discussions with consultants, evaluation of patient's response to treatment, examination of patient, obtaining history from patient or surrogate, ordering and performing treatments and interventions, ordering and review of laboratory studies, ordering and review of radiographic studies, pulse oximetry and re-evaluation of patient's condition.   Labs Review Labs Reviewed - No data to display  Imaging Review No results found.   EKG Interpretation None      MDM   Final diagnoses:  Allergic reaction, initial encounter  Anaphylaxis, initial encounter    Allergic reaction.  Benadryl, Solu-Medrol, Pepcid, intramuscular epinephrine.  Placed on monitor.  Will follow closely.  IV in place.  Blood pressure soft.  1 L bolus  at this time.  1:38 PM Continuing to feel good. Rash improved. Lip swelling improved  At 7 discharge the patient is doing much better.  Her lip swelling is resolved.  Rash is resolved.  Discharge home in good condition.    Jola Schmidt, MD 02/04/15 [phone]

## 2015-02-04 NOTE — Discharge Instructions (Signed)

## 2015-02-04 NOTE — ED Notes (Signed)
Pt sts allergic reaction to unknown; pt sts ate shrimp today but no hx of same; pt sts swelling in lips and tightness in chest; pt sts some hives and itching; pt took claritin prior to arrival

## 2015-02-08 DIAGNOSIS — R946 Abnormal results of thyroid function studies: Secondary | ICD-10-CM | POA: Diagnosis not present

## 2015-02-11 DIAGNOSIS — N3020 Other chronic cystitis without hematuria: Secondary | ICD-10-CM | POA: Diagnosis not present

## 2015-02-11 DIAGNOSIS — N3946 Mixed incontinence: Secondary | ICD-10-CM | POA: Diagnosis not present

## 2015-02-11 DIAGNOSIS — N3944 Nocturnal enuresis: Secondary | ICD-10-CM | POA: Diagnosis not present

## 2015-02-13 DIAGNOSIS — F902 Attention-deficit hyperactivity disorder, combined type: Secondary | ICD-10-CM | POA: Diagnosis not present

## 2015-02-13 DIAGNOSIS — F39 Unspecified mood [affective] disorder: Secondary | ICD-10-CM | POA: Diagnosis not present

## 2015-02-25 DIAGNOSIS — Z6825 Body mass index (BMI) 25.0-25.9, adult: Secondary | ICD-10-CM | POA: Diagnosis not present

## 2015-02-25 DIAGNOSIS — M5412 Radiculopathy, cervical region: Secondary | ICD-10-CM | POA: Diagnosis not present

## 2015-02-25 DIAGNOSIS — M47816 Spondylosis without myelopathy or radiculopathy, lumbar region: Secondary | ICD-10-CM | POA: Diagnosis not present

## 2015-02-25 DIAGNOSIS — M47812 Spondylosis without myelopathy or radiculopathy, cervical region: Secondary | ICD-10-CM | POA: Diagnosis not present

## 2015-02-25 DIAGNOSIS — M5416 Radiculopathy, lumbar region: Secondary | ICD-10-CM | POA: Diagnosis not present

## 2015-03-04 DIAGNOSIS — M47812 Spondylosis without myelopathy or radiculopathy, cervical region: Secondary | ICD-10-CM | POA: Diagnosis not present

## 2015-03-04 DIAGNOSIS — M5412 Radiculopathy, cervical region: Secondary | ICD-10-CM | POA: Diagnosis not present

## 2015-03-22 ENCOUNTER — Encounter (HOSPITAL_COMMUNITY): Payer: Self-pay | Admitting: Emergency Medicine

## 2015-03-22 ENCOUNTER — Emergency Department (HOSPITAL_COMMUNITY)
Admission: EM | Admit: 2015-03-22 | Discharge: 2015-03-23 | Disposition: A | Discharge status: Home / Self Care | Payer: Commercial Managed Care - HMO | Attending: Emergency Medicine | Admitting: Emergency Medicine

## 2015-03-22 DIAGNOSIS — Z3202 Encounter for pregnancy test, result negative: Secondary | ICD-10-CM | POA: Diagnosis not present

## 2015-03-22 DIAGNOSIS — G40909 Epilepsy, unspecified, not intractable, without status epilepticus: Secondary | ICD-10-CM | POA: Diagnosis not present

## 2015-03-22 DIAGNOSIS — Z792 Long term (current) use of antibiotics: Secondary | ICD-10-CM | POA: Diagnosis not present

## 2015-03-22 DIAGNOSIS — Y9389 Activity, other specified: Secondary | ICD-10-CM | POA: Insufficient documentation

## 2015-03-22 DIAGNOSIS — F918 Other conduct disorders: Secondary | ICD-10-CM | POA: Diagnosis not present

## 2015-03-22 DIAGNOSIS — Z8619 Personal history of other infectious and parasitic diseases: Secondary | ICD-10-CM | POA: Diagnosis not present

## 2015-03-22 DIAGNOSIS — W260XXA Contact with knife, initial encounter: Secondary | ICD-10-CM | POA: Diagnosis not present

## 2015-03-22 DIAGNOSIS — Z79899 Other long term (current) drug therapy: Secondary | ICD-10-CM | POA: Diagnosis not present

## 2015-03-22 DIAGNOSIS — R45851 Suicidal ideations: Secondary | ICD-10-CM | POA: Diagnosis not present

## 2015-03-22 DIAGNOSIS — S51812A Laceration without foreign body of left forearm, initial encounter: Secondary | ICD-10-CM | POA: Diagnosis not present

## 2015-03-22 DIAGNOSIS — R4589 Other symptoms and signs involving emotional state: Secondary | ICD-10-CM

## 2015-03-22 DIAGNOSIS — Z23 Encounter for immunization: Secondary | ICD-10-CM | POA: Diagnosis not present

## 2015-03-22 DIAGNOSIS — Y998 Other external cause status: Secondary | ICD-10-CM | POA: Diagnosis not present

## 2015-03-22 DIAGNOSIS — Y9289 Other specified places as the place of occurrence of the external cause: Secondary | ICD-10-CM | POA: Diagnosis not present

## 2015-03-22 DIAGNOSIS — F419 Anxiety disorder, unspecified: Secondary | ICD-10-CM | POA: Diagnosis not present

## 2015-03-22 DIAGNOSIS — F329 Major depressive disorder, single episode, unspecified: Secondary | ICD-10-CM | POA: Diagnosis not present

## 2015-03-22 DIAGNOSIS — F32A Depression, unspecified: Secondary | ICD-10-CM

## 2015-03-22 DIAGNOSIS — Z7952 Long term (current) use of systemic steroids: Secondary | ICD-10-CM | POA: Diagnosis not present

## 2015-03-22 DIAGNOSIS — R4689 Other symptoms and signs involving appearance and behavior: Secondary | ICD-10-CM

## 2015-03-22 LAB — CBC
HEMATOCRIT: 42.4 % (ref 36.0–46.0)
HEMOGLOBIN: 14 g/dL (ref 12.0–15.0)
MCH: 31 pg (ref 26.0–34.0)
MCHC: 33 g/dL (ref 30.0–36.0)
MCV: 93.8 fL (ref 78.0–100.0)
Platelets: 243 10*3/uL (ref 150–400)
RBC: 4.52 MIL/uL (ref 3.87–5.11)
RDW: 13 % (ref 11.5–15.5)
WBC: 7.2 10*3/uL (ref 4.0–10.5)

## 2015-03-22 LAB — COMPREHENSIVE METABOLIC PANEL
ALBUMIN: 4.5 g/dL (ref 3.5–5.0)
ALK PHOS: 60 U/L (ref 38–126)
ALT: 20 U/L (ref 14–54)
AST: 25 U/L (ref 15–41)
Anion gap: 9 (ref 5–15)
BUN: 18 mg/dL (ref 6–20)
CALCIUM: 8.9 mg/dL (ref 8.9–10.3)
CO2: 25 mmol/L (ref 22–32)
CREATININE: 0.78 mg/dL (ref 0.44–1.00)
Chloride: 105 mmol/L (ref 101–111)
GFR calc Af Amer: >60 mL/min (ref >60)
Glucose, Bld: 98 mg/dL (ref 65–99)
Potassium: 4.4 mmol/L (ref 3.5–5.1)
SODIUM: 139 mmol/L (ref 135–145)
Total Bilirubin: 0.6 mg/dL (ref 0.3–1.2)
Total Protein: 7.3 g/dL (ref 6.5–8.1)

## 2015-03-22 LAB — SALICYLATE LEVEL

## 2015-03-22 LAB — RAPID URINE DRUG SCREEN, HOSP PERFORMED
AMPHETAMINES: POSITIVE — AB
BARBITURATES: NOT DETECTED
Benzodiazepines: POSITIVE — AB
Cocaine: NOT DETECTED
OPIATES: POSITIVE — AB
Tetrahydrocannabinol: NOT DETECTED

## 2015-03-22 LAB — POC URINE PREG, ED: Preg Test, Ur: NEGATIVE

## 2015-03-22 LAB — ACETAMINOPHEN LEVEL: Acetaminophen (Tylenol), Serum: <10 ug/mL — ABNORMAL LOW (ref 10–30)

## 2015-03-22 LAB — ETHANOL

## 2015-03-22 MED ORDER — ONDANSETRON HCL 4 MG PO TABS: 4.0000 mg | ORAL_TABLET | Freq: Three times a day (TID) | ORAL | Status: DC | PRN

## 2015-03-22 MED ORDER — TETANUS-DIPHTH-ACELL PERTUSSIS 5-2.5-18.5 LF-MCG/0.5 IM SUSP: 0.5000 mL | Freq: Once | INTRAMUSCULAR | Status: DC

## 2015-03-22 MED ORDER — PANTOPRAZOLE SODIUM 40 MG PO TBEC
40.0000 mg | DELAYED_RELEASE_TABLET | Freq: Every day | ORAL | Status: DC
  Administered 2015-03-22 – 2015-03-23 (×2): 40 mg via ORAL
  Filled 2015-03-22 (×2): qty 1

## 2015-03-22 MED ORDER — PREGABALIN 50 MG PO CAPS
50.0000 mg | ORAL_CAPSULE | Freq: Three times a day (TID) | ORAL | Status: DC
  Administered 2015-03-22 – 2015-03-23 (×3): 50 mg via ORAL
  Filled 2015-03-22 (×3): qty 1

## 2015-03-22 MED ORDER — CYCLOBENZAPRINE HCL 10 MG PO TABS: 5.0000 mg | ORAL_TABLET | Freq: Three times a day (TID) | ORAL | Status: DC | PRN

## 2015-03-22 MED ORDER — FUROSEMIDE 40 MG PO TABS: 20.0000 mg | ORAL_TABLET | Freq: Every day | ORAL | Status: DC | PRN

## 2015-03-22 MED ORDER — POTASSIUM CHLORIDE ER 10 MEQ PO TBCR
10.0000 meq | EXTENDED_RELEASE_TABLET | Freq: Every day | ORAL | Status: DC | PRN
  Filled 2015-03-22: qty 1

## 2015-03-22 MED ORDER — TETANUS-DIPHTH-ACELL PERTUSSIS 5-2.5-18.5 LF-MCG/0.5 IM SUSP
0.5000 mL | Freq: Once | INTRAMUSCULAR | Status: AC
  Administered 2015-03-22: 0.5 mL via INTRAMUSCULAR
  Filled 2015-03-22: qty 0.5

## 2015-03-22 MED ORDER — VALACYCLOVIR HCL 500 MG PO TABS
500.0000 mg | ORAL_TABLET | Freq: Every day | ORAL | Status: DC
  Administered 2015-03-22 – 2015-03-23 (×2): 500 mg via ORAL
  Filled 2015-03-22 (×2): qty 1

## 2015-03-22 MED ORDER — LORAZEPAM 2 MG/ML IJ SOLN
1.0000 mg | Freq: Once | INTRAMUSCULAR | Status: AC
  Administered 2015-03-22: 1 mg via INTRAMUSCULAR
  Filled 2015-03-22: qty 1

## 2015-03-22 MED ORDER — NICOTINE 21 MG/24HR TD PT24
21.0000 mg | MEDICATED_PATCH | Freq: Every day | TRANSDERMAL | Status: DC
  Filled 2015-03-22 (×2): qty 1

## 2015-03-22 MED ORDER — ACETAMINOPHEN 325 MG PO TABS
650.0000 mg | ORAL_TABLET | Freq: Four times a day (QID) | ORAL | Status: DC | PRN
  Filled 2015-03-22: qty 2

## 2015-03-22 MED ORDER — IBUPROFEN 200 MG PO TABS: 600.0000 mg | ORAL_TABLET | Freq: Three times a day (TID) | ORAL | Status: DC | PRN

## 2015-03-22 MED ORDER — ACETAMINOPHEN 325 MG PO TABS
650.0000 mg | ORAL_TABLET | ORAL | Status: DC | PRN
  Administered 2015-03-22 – 2015-03-23 (×2): 650 mg via ORAL
  Filled 2015-03-22: qty 2

## 2015-03-22 MED ORDER — ALUM & MAG HYDROXIDE-SIMETH 200-200-20 MG/5ML PO SUSP: 30.0000 mL | ORAL | Status: DC | PRN

## 2015-03-22 MED ORDER — DULOXETINE HCL 30 MG PO CPEP
30.0000 mg | ORAL_CAPSULE | Freq: Two times a day (BID) | ORAL | Status: DC
  Administered 2015-03-22 – 2015-03-23 (×2): 30 mg via ORAL
  Filled 2015-03-22 (×3): qty 1

## 2015-03-22 MED ORDER — AMPHETAMINE-DEXTROAMPHET ER 25 MG PO CP24
25.0000 mg | ORAL_CAPSULE | Freq: Every day | ORAL | Status: DC
  Filled 2015-03-22 (×3): qty 1

## 2015-03-22 MED ORDER — TRIMETHOPRIM 100 MG PO TABS
100.0000 mg | ORAL_TABLET | Freq: Every day | ORAL | Status: DC
  Administered 2015-03-23: 100 mg via ORAL
  Filled 2015-03-22: qty 1

## 2015-03-22 MED ORDER — LEVOTHYROXINE SODIUM 25 MCG PO TABS
25.0000 ug | ORAL_TABLET | Freq: Every day | ORAL | Status: DC
  Administered 2015-03-23: 25 ug via ORAL
  Filled 2015-03-22 (×2): qty 1

## 2015-03-22 MED ORDER — ZOLPIDEM TARTRATE 5 MG PO TABS: 5.0000 mg | ORAL_TABLET | Freq: Every evening | ORAL | Status: DC | PRN

## 2015-03-22 NOTE — ED Provider Notes (Signed)
CSN: [id number]     Arrival date & time 03/22/15  1346 History  This chart was scribed for non-physician practitioner working with Orlie Dakin, MD, by Erling Conte, ED Scribe. This patient was seen in room [facility] and the patient's care was started at 2:30 PM.   Chief Complaint  Patient presents with  . Suicidal   The history is provided by the patient. No language interpreter was used.    HPI Comments: [name] is a 49 y.o. female brought in by [employer] who presents to the Emergency Department due suicidal ideations. Pt has a self inflicted laceration to her left forearm which she states was the only thing she did to try and hurt herself- she used a dull knife. Pt states this was triggered by issues with her husband. Pt states that her and her husband have moved back in together and trying to work out their marriage.She reports they were previously separated due to her previous issues with substance abuse and her husbands affair. Pt notes the affair has continued to be the source of their arguments. She reports her and her husband got into argument this morning. 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 Pt regularly sees a therapist but her next appt is not until next week. Pt states that she feels like maybe if she kills herself then her husband will feel some pain. Pt is also having issues with her youngest daughter as well which is causing her emotional pain. Pt called her mother today and said her goodbyes and that's when her mother called [employer] to have her brought to the ED. She is currently prescribed oxycodone. She denies any current substance abuse but does have a h/o of it. Pt states it has been a while since her last t-dap. She denies any hallucinations or HI.   Past Medical History  Diagnosis Date  . Depression   . DDD (degenerative disc disease)   . DDD  (degenerative disc disease)   . DDD (degenerative disc disease)   . Pertussis   . Seizures    Past Surgical History  Procedure Laterality Date  . Cervical spine surgery    . Cesarean section    . Tubal ligation    . Tonsillectomy     No family history on file. History  Substance Use Topics  . Smoking status: Never Smoker   . Smokeless tobacco: Not on file  . Alcohol Use: No     Comment: quit drinking 7 months   OB History    No data available     Review of Systems  Skin: Positive for wound.  Psychiatric/Behavioral: Positive for suicidal ideas, self-injury and dysphoric mood. Negative for hallucinations.  All other systems reviewed and are negative.     Allergies  Shrimp  Home Medications   Prior to Admission medications   Medication Sig Start Date End Date Taking? Authorizing Provider  acetaminophen (TYLENOL) 325 MG tablet Take 650 mg by mouth every 6 (six) hours as needed for mild pain.   Yes Historical Provider, MD  amphetamine-dextroamphetamine (ADDERALL XR) 25 MG 24 hr capsule Take 25 mg by mouth every morning.   Yes Historical Provider, MD  cyclobenzaprine (FLEXERIL) 5 MG tablet Take 5 mg by mouth 3 (three) times daily as needed for muscle spasms.  02/25/15  Yes Historical Provider, MD  DULoxetine (CYMBALTA) 30  MG capsule Take 30 mg by mouth 2 (two) times daily.   Yes Historical Provider, MD  furosemide (LASIX) 20 MG tablet Take 20 mg by mouth daily as needed for fluid.   Yes Historical Provider, MD  levothyroxine (SYNTHROID, LEVOTHROID) 25 MCG tablet Take 25 mcg by mouth daily before breakfast.   Yes Historical Provider, MD  omeprazole (PRILOSEC) 20 MG capsule Take 20 mg by mouth daily as needed (heartburn).  03/14/15  Yes Historical Provider, MD  Oxycodone HCl 10 MG TABS Take 10 mg by mouth every 6 (six) hours as needed (pain).  02/25/15  Yes Historical Provider, MD  potassium chloride (K-DUR) 10 MEQ tablet Take 10 mEq by mouth daily as needed (with furosemide).   Yes  Historical Provider, MD  pregabalin (LYRICA) 50 MG capsule Take 50 mg by mouth 3 (three) times daily.   Yes Historical Provider, MD  SALINE NA Place 2 sprays into both nostrils daily as needed (allergies).   Yes Historical Provider, MD  trimethoprim (TRIMPEX) 100 MG tablet Take 100 mg by mouth daily.   Yes Historical Provider, MD  valACYclovir (VALTREX) 500 MG tablet Take 500 mg by mouth daily.   Yes Historical Provider, MD  clindamycin (CLEOCIN) 300 MG capsule Take 1 capsule (300 mg total) by mouth 3 (three) times daily. Patient not taking: Reported on 12/29/2014 12/10/14   Sinda Du, MD  diphenhydrAMINE (BENADRYL) 25 MG tablet Take 1 tablet (25 mg total) by mouth every 6 (six) hours. Patient not taking: Reported on 03/22/2015 02/04/15   Jola Schmidt, MD  EPINEPHrine (EPIPEN 2-PAK) 0.3 mg/0.3 mL IJ SOAJ injection Inject 0.3 mLs (0.3 mg total) into the muscle once. Patient not taking: Reported on 03/22/2015 02/04/15   Jola Schmidt, MD  famotidine (PEPCID) 20 MG tablet Take 1 tablet (20 mg total) by mouth 2 (two) times daily. Patient not taking: Reported on 03/22/2015 02/04/15   Jola Schmidt, MD  levETIRAcetam (KEPPRA) 500 MG tablet Take 1 tablet (500 mg total) by mouth 2 (two) times daily. Patient not taking: Reported on 02/04/2015 12/29/14   Alvina Chou, PA-C  predniSONE (DELTASONE) 10 MG tablet Take 6 tablets (60 mg total) by mouth daily. Patient not taking: Reported on 03/22/2015 02/04/15   Jola Schmidt, MD   Triage Vitals: BP 129/84 mmHg  Pulse 91  Temp(Src) 97.9 F (36.6 C) (Oral)  Resp 16  SpO2 100%  LMP 02/28/2015  Physical Exam  Constitutional: She is oriented to person, place, and time. She appears well-developed and well-nourished. No distress.  HENT:  Head: Normocephalic and atraumatic.  Eyes: Conjunctivae and EOM are normal.  Neck: Neck supple. No tracheal deviation present.  Cardiovascular: Normal rate.   Pulmonary/Chest: Effort normal. No respiratory distress.   Musculoskeletal: Normal range of motion.  Neurological: She is alert and oriented to person, place, and time.  Skin: Skin is warm and dry.  Multiple superficial lacerations to anterior left forearm x 5  Psychiatric: Her behavior is normal. Her mood appears anxious. She is not actively hallucinating. She exhibits a depressed mood. She expresses suicidal ideation. She expresses no homicidal ideation. She expresses suicidal plans. She expresses no homicidal plans.  Nursing note and vitals reviewed.   ED Course  Procedures (including critical care time)  DIAGNOSTIC STUDIES: Oxygen Saturation is 100% on RA, normal by my interpretation.    COORDINATION OF CARE:    Labs Review Labs Reviewed  CBC  COMPREHENSIVE METABOLIC PANEL  ACETAMINOPHEN LEVEL  ETHANOL  SALICYLATE LEVEL  URINE RAPID DRUG  SCREEN (HOSP PERFORMED) NOT AT [facility]  POC URINE PREG, ED    Imaging Review No results found.   EKG Interpretation None      MDM   Final diagnoses:  Depression  Suicidal behavior   1mg  IM Ativan given to patient for anxiety.  Tetanus vaccinations updated.  Home meds reviewed and ordered Holding labs ordered Screening labs pending TTS consult pending  Filed Vitals:   03/22/15 1351  BP: 129/84  Pulse: 91  Temp: 97.9 F (36.6 C)  Resp: 16     I personally performed the services described in this documentation, which was scribed in my presence. The recorded information has been reviewed and is accurate.    Delos Haring, PA-C 03/22/15 [place], PA-C 03/22/15 1511  Orlie Dakin, MD 03/22/15 [phone]

## 2015-03-22 NOTE — ED Notes (Signed)
Pt brought in by [employer] for self inflicted laceration to left forearm.  Pt states that her and her husband have moved back in together and trying to work out their marriage.  Pt states that they got into an argument this morning.  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 Pt states that she is scheduled to see her therapist next week and didn't think to call her today.  Pt states, "maybe if i kill myself then maybe he will feel some pain".  Then add, "my youngest daughter doesn't care about me".  Pt called her mother and spoke with her today about "goodbyes" and then she was the one who called the police.

## 2015-03-22 NOTE — ED Notes (Signed)
Pt resting, no c/o pain will continue to monitor. Jeanie Sewer, RN 8:18 PM 03/22/2015

## 2015-03-22 NOTE — ED Provider Notes (Signed)
4:10 p.m. patient sleeping easily arousable pleasant cooperative states "I was having some problems at home" she agrees to inpatient stay for psychiatric treatment and evaluation Results for orders placed or performed during the hospital encounter of 03/22/15  Acetaminophen level  Result Value Ref Range   Acetaminophen (Tylenol), Serum <10 (L) 10 - 30 ug/mL  CBC  Result Value Ref Range   WBC 7.2 4.0 - 10.5 K/uL   RBC 4.52 3.87 - 5.11 MIL/uL   Hemoglobin 14.0 12.0 - 15.0 g/dL   HCT 42.4 36.0 - 46.0 %   MCV 93.8 78.0 - 100.0 fL   MCH 31.0 26.0 - 34.0 pg   MCHC 33.0 30.0 - 36.0 g/dL   RDW 13.0 11.5 - 15.5 %   Platelets 243 150 - 400 K/uL  Comprehensive metabolic panel  Result Value Ref Range   Sodium 139 135 - 145 mmol/L   Potassium 4.4 3.5 - 5.1 mmol/L   Chloride 105 101 - 111 mmol/L   CO2 25 22 - 32 mmol/L   Glucose, Bld 98 65 - 99 mg/dL   BUN 18 6 - 20 mg/dL   Creatinine, Ser 0.78 0.44 - 1.00 mg/dL   Calcium 8.9 8.9 - 10.3 mg/dL   Total Protein 7.3 6.5 - 8.1 g/dL   Albumin 4.5 3.5 - 5.0 g/dL   AST 25 15 - 41 U/L   ALT 20 14 - 54 U/L   Alkaline Phosphatase 60 38 - 126 U/L   Total Bilirubin 0.6 0.3 - 1.2 mg/dL   GFR calc non Af Amer >60 >60 mL/min   GFR calc Af Amer >60 >60 mL/min   Anion gap 9 5 - 15  Ethanol (ETOH)  Result Value Ref Range   Alcohol, Ethyl (B) <5 <5 mg/dL  Salicylate level  Result Value Ref Range   Salicylate Lvl <6.0 2.8 - 30.0 mg/dL  Urine Drug Screen  Result Value Ref Range   Opiates POSITIVE (A) NONE DETECTED   Cocaine NONE DETECTED NONE DETECTED   Benzodiazepines POSITIVE (A) NONE DETECTED   Amphetamines POSITIVE (A) NONE DETECTED   Tetrahydrocannabinol NONE DETECTED NONE DETECTED   Barbiturates NONE DETECTED NONE DETECTED  POC Urine Pregnancy, (if pre-menopausal female)  not at [facility]  Result Value Ref Range   Preg Test, Ur NEGATIVE NEGATIVE   No results found.   Orlie Dakin, MD 03/22/15 [phone]
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
## 2015-03-22 NOTE — ED Notes (Addendum)
Patient belongings: Shirt, pants, shoes, purse. Keys and cell phone [employer]

## 2015-03-22 NOTE — ED Notes (Signed)
Patient is unable to urinate at this time she will try again in a bit

## 2015-03-23 ENCOUNTER — Encounter (HOSPITAL_COMMUNITY): Payer: Self-pay | Admitting: *Deleted

## 2015-03-23 ENCOUNTER — Inpatient Hospital Stay (HOSPITAL_COMMUNITY)
Admission: AD | Admit: 2015-03-23 | Discharge: 2015-03-28 | DRG: 885 | Disposition: A | Discharge status: Home / Self Care | Payer: Commercial Managed Care - HMO | Source: Intra-hospital | Attending: Psychiatry | Admitting: Psychiatry

## 2015-03-23 DIAGNOSIS — F419 Anxiety disorder, unspecified: Secondary | ICD-10-CM | POA: Diagnosis not present

## 2015-03-23 DIAGNOSIS — F17210 Nicotine dependence, cigarettes, uncomplicated: Secondary | ICD-10-CM | POA: Diagnosis not present

## 2015-03-23 DIAGNOSIS — R45851 Suicidal ideations: Secondary | ICD-10-CM | POA: Diagnosis not present

## 2015-03-23 DIAGNOSIS — Z915 Personal history of self-harm: Secondary | ICD-10-CM

## 2015-03-23 DIAGNOSIS — Z3202 Encounter for pregnancy test, result negative: Secondary | ICD-10-CM | POA: Diagnosis not present

## 2015-03-23 DIAGNOSIS — Z79899 Other long term (current) drug therapy: Secondary | ICD-10-CM

## 2015-03-23 DIAGNOSIS — Z7952 Long term (current) use of systemic steroids: Secondary | ICD-10-CM | POA: Diagnosis not present

## 2015-03-23 DIAGNOSIS — G40909 Epilepsy, unspecified, not intractable, without status epilepticus: Secondary | ICD-10-CM | POA: Diagnosis not present

## 2015-03-23 DIAGNOSIS — F918 Other conduct disorders: Secondary | ICD-10-CM | POA: Diagnosis not present

## 2015-03-23 DIAGNOSIS — Z23 Encounter for immunization: Secondary | ICD-10-CM | POA: Diagnosis not present

## 2015-03-23 DIAGNOSIS — Z792 Long term (current) use of antibiotics: Secondary | ICD-10-CM | POA: Diagnosis not present

## 2015-03-23 DIAGNOSIS — F332 Major depressive disorder, recurrent severe without psychotic features: Principal | ICD-10-CM | POA: Diagnosis present

## 2015-03-23 DIAGNOSIS — F329 Major depressive disorder, single episode, unspecified: Secondary | ICD-10-CM | POA: Diagnosis not present

## 2015-03-23 DIAGNOSIS — S51812A Laceration without foreign body of left forearm, initial encounter: Secondary | ICD-10-CM | POA: Diagnosis not present

## 2015-03-23 MED ORDER — ACETAMINOPHEN 325 MG PO TABS
650.0000 mg | ORAL_TABLET | ORAL | Status: DC | PRN
Start: 2015-03-23 — End: 2015-03-28
  Administered 2015-03-24 – 2015-03-27 (×6): 650 mg via ORAL
  Filled 2015-03-23 (×7): qty 2

## 2015-03-23 MED ORDER — DULOXETINE HCL 30 MG PO CPEP
30.0000 mg | ORAL_CAPSULE | Freq: Two times a day (BID) | ORAL | Status: DC
  Administered 2015-03-23 – 2015-03-25 (×4): 30 mg via ORAL
  Filled 2015-03-23 (×10): qty 1

## 2015-03-23 MED ORDER — ALUM & MAG HYDROXIDE-SIMETH 200-200-20 MG/5ML PO SUSP: 30.0000 mL | ORAL | Status: DC | PRN

## 2015-03-23 MED ORDER — TRIMETHOPRIM 100 MG PO TABS
100.0000 mg | ORAL_TABLET | Freq: Every day | ORAL | Status: DC
  Administered 2015-03-24 – 2015-03-28 (×5): 100 mg via ORAL
  Filled 2015-03-23 (×7): qty 1

## 2015-03-23 MED ORDER — PREGABALIN 50 MG PO CAPS
50.0000 mg | ORAL_CAPSULE | Freq: Three times a day (TID) | ORAL | Status: DC
  Administered 2015-03-23 – 2015-03-28 (×16): 50 mg via ORAL
  Filled 2015-03-23 (×16): qty 1

## 2015-03-23 MED ORDER — NICOTINE 21 MG/24HR TD PT24
21.0000 mg | MEDICATED_PATCH | Freq: Every day | TRANSDERMAL | Status: DC
  Filled 2015-03-23 (×4): qty 1

## 2015-03-23 MED ORDER — FUROSEMIDE 20 MG PO TABS
20.0000 mg | ORAL_TABLET | Freq: Every day | ORAL | Status: DC | PRN
  Administered 2015-03-24: 20 mg via ORAL

## 2015-03-23 MED ORDER — AMPHETAMINE-DEXTROAMPHET ER 5 MG PO CP24
5.0000 mg | ORAL_CAPSULE | Freq: Every day | ORAL | Status: DC
  Administered 2015-03-24 – 2015-03-25 (×2): 5 mg via ORAL
  Filled 2015-03-23 (×2): qty 1

## 2015-03-23 MED ORDER — CYCLOBENZAPRINE HCL 10 MG PO TABS
5.0000 mg | ORAL_TABLET | Freq: Three times a day (TID) | ORAL | Status: DC | PRN
  Administered 2015-03-23 – 2015-03-28 (×14): 5 mg via ORAL
  Filled 2015-03-23 (×15): qty 1

## 2015-03-23 MED ORDER — ACETAMINOPHEN 325 MG PO TABS
650.0000 mg | ORAL_TABLET | Freq: Four times a day (QID) | ORAL | Status: DC | PRN
  Administered 2015-03-24: 650 mg via ORAL

## 2015-03-23 MED ORDER — AMPHETAMINE-DEXTROAMPHET ER 5 MG PO CP24
5.0000 mg | ORAL_CAPSULE | Freq: Every day | ORAL | Status: DC
  Administered 2015-03-23: 5 mg via ORAL
  Filled 2015-03-23: qty 1

## 2015-03-23 MED ORDER — ZOLPIDEM TARTRATE 5 MG PO TABS
5.0000 mg | ORAL_TABLET | Freq: Every evening | ORAL | Status: DC | PRN
  Administered 2015-03-23: 5 mg via ORAL
  Filled 2015-03-23: qty 1

## 2015-03-23 MED ORDER — HYDROXYZINE HCL 50 MG PO TABS
ORAL_TABLET | ORAL | Status: AC
  Administered 2015-03-23: 50 mg
  Filled 2015-03-23: qty 1

## 2015-03-23 MED ORDER — PNEUMOCOCCAL VAC POLYVALENT 25 MCG/0.5ML IJ INJ: 0.5000 mL | INJECTION | INTRAMUSCULAR | Status: DC

## 2015-03-23 MED ORDER — HYDROXYZINE HCL 50 MG PO TABS
50.0000 mg | ORAL_TABLET | Freq: Four times a day (QID) | ORAL | Status: DC | PRN
  Administered 2015-03-24 – 2015-03-28 (×11): 50 mg via ORAL
  Filled 2015-03-23 (×11): qty 1
  Filled 2015-03-23: qty 6

## 2015-03-23 MED ORDER — LEVOTHYROXINE SODIUM 25 MCG PO TABS
25.0000 ug | ORAL_TABLET | Freq: Every day | ORAL | Status: DC
  Administered 2015-03-24 – 2015-03-28 (×5): 25 ug via ORAL
  Filled 2015-03-23 (×8): qty 1

## 2015-03-23 MED ORDER — POTASSIUM CHLORIDE ER 10 MEQ PO TBCR
10.0000 meq | EXTENDED_RELEASE_TABLET | Freq: Every day | ORAL | Status: DC | PRN
  Filled 2015-03-23: qty 1

## 2015-03-23 MED ORDER — IBUPROFEN 600 MG PO TABS
600.0000 mg | ORAL_TABLET | Freq: Three times a day (TID) | ORAL | Status: DC | PRN
  Administered 2015-03-23 – 2015-03-26 (×4): 600 mg via ORAL
  Filled 2015-03-23 (×4): qty 1

## 2015-03-23 MED ORDER — AMPHETAMINE-DEXTROAMPHET ER 10 MG PO CP24
20.0000 mg | ORAL_CAPSULE | Freq: Every day | ORAL | Status: DC
  Administered 2015-03-24 – 2015-03-25 (×2): 20 mg via ORAL
  Filled 2015-03-23 (×2): qty 2

## 2015-03-23 MED ORDER — ONDANSETRON HCL 4 MG PO TABS
4.0000 mg | ORAL_TABLET | Freq: Three times a day (TID) | ORAL | Status: DC | PRN
  Administered 2015-03-27 – 2015-03-28 (×2): 4 mg via ORAL
  Filled 2015-03-23 (×2): qty 1

## 2015-03-23 MED ORDER — PANTOPRAZOLE SODIUM 40 MG PO TBEC
40.0000 mg | DELAYED_RELEASE_TABLET | Freq: Every day | ORAL | Status: DC
  Administered 2015-03-24 – 2015-03-28 (×5): 40 mg via ORAL
  Filled 2015-03-23 (×9): qty 1

## 2015-03-23 MED ORDER — AMPHETAMINE-DEXTROAMPHET ER 10 MG PO CP24
20.0000 mg | ORAL_CAPSULE | Freq: Every day | ORAL | Status: DC
  Administered 2015-03-23: 20 mg via ORAL
  Filled 2015-03-23 (×2): qty 2

## 2015-03-23 MED ORDER — VALACYCLOVIR HCL 500 MG PO TABS
500.0000 mg | ORAL_TABLET | Freq: Every day | ORAL | Status: DC
  Administered 2015-03-24 – 2015-03-28 (×5): 500 mg via ORAL
  Filled 2015-03-23 (×7): qty 1

## 2015-03-23 NOTE — ED Notes (Signed)
Waiting on meds from pharmacy 

## 2015-03-23 NOTE — ED Notes (Signed)
Report given to [facility]

## 2015-03-23 NOTE — ED Notes (Signed)
Support paperwork completed and faxed to [facility]. MSW, LCSW Therapeutic Triage [facility] Specialist

## 2015-03-23 NOTE — BH Assessment (Signed)
[facility] Assessment Progress Note 03/23/15 Pt accepted to [id number] to Dr. Parke Poisson per Randall Hiss.

## 2015-03-23 NOTE — Tx Team (Signed)
Initial Interdisciplinary Treatment Plan 2  PATIENT STRESSORS: Educational concerns Financial difficulties Health problems Marital or family conflict Medication change or noncompliance   PATIENT STRENGTHS: Ability for insight Active sense of humor Average or above average intelligence Capable of independent living Communication skills Financial means   PROBLEM LIST: Problem List/Patient Goals Date to be addressed Date deferred Reason deferred Estimated date of resolution  Suicidal Ideation 2' Depression 03/23/15     Impulsive behavior       Polysubstance Abuse 03/23/15 9    49yo                                               DISCHARGE CRITERIA:  Ability to meet basic life and health needs Adequate post-discharge living arrangements Improved stabilization in mood, thinking, and/or behavior Medical problems require only outpatient monitoring Motivation to continue treatment in a less acute level of care Need for constant or close observation no longer present Reduction of life-threatening or endangering symptoms to within safe limits  PRELIMINARY DISCHARGE PLAN: Attend aftercare/continuing care group Attend PHP/IOP Attend 12-step recovery group Outpatient therapy Participate in family therapy Placement in alternative living arrangements  PATIENT/FAMIILY INVOLVEMENT: This treatment plan has been presented to and reviewed with the patient, [name], and/or family member, .  The patient and family have been given the opportunity to ask questions and make suggestions.  Lauralyn Primes 03/23/2015, 4:49 PM

## 2015-03-23 NOTE — Progress Notes (Signed)
[name] is a 49 yo caucasian female who is admitted to [facility] from [facility] ED due to Her mother calling [employer] today ( to go pick her up) after mother received " good bye" letter from patient. Vickie says " I 'm just so tired of hurtingl...ibuprofen can't go on this way...". She shares that she is in " a lot" of pain due to her cervical discs, that she's had 3 back surgeries and has , that she has a PMH of polysubstance abuse ( shes been sober x 3 yrs), currently  Taking oxycodone for chronic back pain,  On adderrall, has hormonal hotflashes, and today she is struggling with suicidality over her feelings of hurt, loss, abandonment related to her relationship with her husband. She explains that they have been trying to " get back together"...after being estranged.Marland KitchenHer husband had an affair and his GF got pregnant and ( supposedly) the baby died...and in the meantime, her husband has lost his remaining kidney function and has had to start maintenance hemodialysis. She is sobbing.rocking back and forth and crying as she shares this with this nurse.   After her admission is completed, she is oriented to the unit and admission is completed. 1800, pt became upset about " how upset and nervous I am" and this nurse paged on-call doc and received vo for vistaril 50 mg po q6 prn anxiety and pt was medicated at  1824, with relief noted. Pt contracts with this Probation officer for safety and therapeutic relationship is inititated.

## 2015-03-23 NOTE — Progress Notes (Signed)
[place] Group Notes:  (Nursing/MHT/Case Management/Adjunct)  Date:  03/23/2015  Time:  11:41 PM  Type of Therapy:  Psychoeducational Skills  Participation Level:  Active  Participation Quality:  Appropriate  Affect:  Appropriate  Cognitive:  Appropriate  Insight:  Appropriate  Engagement in Group:  Engaged  Modes of Intervention:  Discussion  Summary of Progress/Problems: Tonight in wrap up group [name] mentioned that today was her first day and she was getting adjusted. I let her know that the doctors and social workers would be back in the morning and she would be able to discuss more with them at that time.  [name] 03/23/2015, 11:41 PM

## 2015-03-24 ENCOUNTER — Encounter (HOSPITAL_COMMUNITY): Payer: Self-pay | Admitting: Registered Nurse

## 2015-03-24 DIAGNOSIS — R45851 Suicidal ideations: Secondary | ICD-10-CM

## 2015-03-24 MED ORDER — ZOLPIDEM TARTRATE 10 MG PO TABS: 10.0000 mg | ORAL_TABLET | Freq: Every evening | ORAL | Status: DC | PRN

## 2015-03-24 MED ORDER — QUETIAPINE FUMARATE 100 MG PO TABS
100.0000 mg | ORAL_TABLET | Freq: Every day | ORAL | Status: DC
  Administered 2015-03-24: 100 mg via ORAL
  Filled 2015-03-24 (×4): qty 1

## 2015-03-24 NOTE — Plan of Care (Signed)
Problem: Diagnosis: Increased Risk For Suicide Attempt Goal: STG-Patient Will Attend All Groups On The Unit Outcome: Progressing Patient attended group this evening     

## 2015-03-24 NOTE — Progress Notes (Signed)
Patient ID: [name], female   DOB: [birth year], 49 y.o.   MRN: [id number] D: Patient presents with depressed mood; she is anxious and c/o pain in back.  Patient was given flexeril for back spasms.  Patient takes oxycodone at home.  She rates her depression as a 6.5; hopelessness a 5; anxiety as an 9.  Her goal today is "getting pain and anxiety under control."  She denies SI/HI/AVH.  She is sleeping well; appetitie is good.  Her energy level is low. A: Continue to monitor medication management and MD orders.  Safety checks completed every 15 minutes per protocol.  Meet 1:1 with patient to discuss concerns and offer encouragement. R: Patient's behavior is appropriate to situation.

## 2015-03-24 NOTE — [facility] Group Notes (Signed)
[place] Group Notes:  (Nursing/MHT/Case Management/Adjunct)  Date:  03/24/2015  Time:  1000 am  Type of Therapy:  Psychoeducational Skills  Participation Level:  Minimal  Participation Quality:  Attentive  Affect:  Anxious  Cognitive:  Alert  Insight:  Lacking  Engagement in Group:  Engaged  Modes of Intervention:  Support  Summary of Progress/Problems: Patient's main concern is pain and anxiety.  Rates her depression as a 7; hopelessness as a 6.  Zipporah Plants 03/24/2015, 10:50 AM

## 2015-03-24 NOTE — BHH Group Notes (Signed)
[place] Group Notes:  (Clinical Social Work)  03/24/2015  1:15-2:15PM  Summary of Progress/Problems:   The main focus of today's process group was to   1)  discuss the importance of adding supports  2)  define health supports versus unhealthy supports  3)  identify the patient's current unhealthy supports and plan how to handle them  4)  Identify the patient's current healthy supports and plan what to add.  An emphasis was placed on using counselor, doctor, therapy groups, 12-step groups, and problem-specific support groups to expand supports.    The patient expressed full comprehension of the concepts presented, and agreed that there is a need to add more supports.  The patient stated that her mother is an unhealthy support, will give her pills.  Type of Therapy:  Process Group with Motivational Interviewing  Participation Level:  Active  Participation Quality:  Attentive  Affect:  Anxious, Blunted and Depressed  Cognitive:  Alert  Insight:  Developing/Improving  Engagement in Therapy:  Engaged  Modes of Intervention:   Education, Support and Processing, Activity  Selmer Dominion, LCSW 03/24/2015
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
## 2015-03-24 NOTE — [facility] Suicide Risk Assessment (Signed)
[place] INPATIENT:  Family/Significant Other Suicide Prevention Education  Suicide Prevention Education:  Patient Refusal for Family/Significant Other Suicide Prevention Education: The patient [name] has refused to provide written consent for family/significant other to be provided Family/Significant Other Suicide Prevention Education during admission and/or prior to discharge.  Physician notified. Writer provided suicide prevention education directly to patient; conversation included risk factors, warning signs and resources to contact for help. Mobile crisis services explained and  explanations given as to resources which will be included on patient's discharge paperwork.   Lyla Glassing 03/24/2015, 11:05 AM
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
## 2015-03-24 NOTE — Progress Notes (Signed)
Patient denied SI and HI.  Denied A/V hallucinations.  Pain and anxiety medication given to patient.  Patient slept after medications.  Respirations even and unlabored.  No signs/symptoms of pain/distress noted on patient's face/body movements.  Safety maintained with 15 minute checks.
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
## 2015-03-24 NOTE — Progress Notes (Signed)
Writer has observed patient with her visitor in her room lying on the bed together earlier during visitation. She later came up to the nursing station requesting tylenol for a headache which she received. She sat up in the dayroom briefly and then returned to request her hs medications and a flexeril. She has been more isolative this evening. Support and encouragement given, safety maintained on unit with 15 min checks.

## 2015-03-25 MED ORDER — NICOTINE POLACRILEX 2 MG MT GUM
2.0000 mg | CHEWING_GUM | OROMUCOSAL | Status: DC | PRN
  Filled 2015-03-25: qty 1

## 2015-03-25 MED ORDER — DULOXETINE HCL 30 MG PO CPEP: 30.0000 mg | ORAL_CAPSULE | ORAL | Status: DC

## 2015-03-25 MED ORDER — AMPHETAMINE-DEXTROAMPHET ER 5 MG PO CP24: 5.0000 mg | ORAL_CAPSULE | Freq: Every day | ORAL | Status: DC

## 2015-03-25 MED ORDER — DULOXETINE HCL 30 MG PO CPEP
30.0000 mg | ORAL_CAPSULE | Freq: Every morning | ORAL | Status: DC
  Administered 2015-03-25: 30 mg via ORAL
  Filled 2015-03-25: qty 1

## 2015-03-25 MED ORDER — AMPHETAMINE-DEXTROAMPHET ER 10 MG PO CP24: 30.0000 mg | ORAL_CAPSULE | Freq: Every day | ORAL | Status: DC

## 2015-03-25 MED ORDER — TRAZODONE HCL 100 MG PO TABS
100.0000 mg | ORAL_TABLET | Freq: Every evening | ORAL | Status: DC | PRN
  Administered 2015-03-25 – 2015-03-27 (×3): 100 mg via ORAL
  Filled 2015-03-25: qty 3
  Filled 2015-03-25 (×3): qty 1

## 2015-03-25 MED ORDER — DULOXETINE HCL 60 MG PO CPEP
60.0000 mg | ORAL_CAPSULE | Freq: Every day | ORAL | Status: DC
  Filled 2015-03-25 (×2): qty 1

## 2015-03-25 MED ORDER — AMPHETAMINE-DEXTROAMPHET ER 10 MG PO CP24
30.0000 mg | ORAL_CAPSULE | Freq: Every day | ORAL | Status: DC
  Administered 2015-03-26: 30 mg via ORAL
  Filled 2015-03-25: qty 3

## 2015-03-25 NOTE — Progress Notes (Signed)
D: Pt has anxious, depressed affect and mood.  Pt reports her goal tonight is "try not to be so anxious because I have to stay a little bit longer because they changed some of my medications; get some sleep."  Pt denies SI/HI, denies hallucinations.  Pt has been visible in milieu interacting with peers and staff appropriately.  Pt attended evening group.   A: Introduced self to pt.  Met with pt 1:1 and provided support and encouragement.  Actively listened to pt.  PRN medication administered for pain, anxiety, sleep, muscle spasms.  Heat pack provided for pain. R: Pt is compliant with medications.  Pt verbally contracts for safety and reports that she will notify staff of needs and concerns.  Will continue to monitor and assess.

## 2015-03-25 NOTE — Progress Notes (Signed)
Pt attended wrap-up group.  Pt stated that her goal was to get her medications straightened out and get her anxiety under control.  Pt stated that she doesn't feel any better and that she had a "rough" day today d/t pain and anxiety.   Rick Duff, MHT

## 2015-03-25 NOTE — Progress Notes (Signed)
D:  Patient's self inventory sheet, patient has poor sleep, sleep medication is not helpful.  Fair appetite, low energy level, concentration between good and poor.  Rated depression and anxiety 6, hopeless 5.  Denied withdrawals.  Denied withdrawals.  Denied SI.  Physical problems, pain, worst pain #7, back, neck, head.  Pain medication helpful some.  Goal is to work on getting depression medications stable and work on going home.  Plans to talk to MD, SW.  No discharge plans.   A:  Medications administered per MD orders.  Emotional support and encouragement given patient. R:  Denied SI and HI, contracts for safety.  Denied A/V hallucinations.  Safety maintained with 15 minute checks. Information given patient on her diagnosis.

## 2015-03-25 NOTE — B[facility] Group Notes (Signed)
   [facility] LCSW Aftercare Discharge Planning Group Note  03/25/2015  8:45 AM   Participation Quality: Alert, Appropriate and Oriented  Mood/Affect: Depressed and Flat  Depression Rating: 5  Anxiety Rating: 4-5  Thoughts of Suicide: Pt denies SI/HI  Will you contract for safety? Yes  Current AVH: Pt denies  Plan for Discharge/Comments: Pt attended discharge planning group and actively participated in group. CSW provided pt with today's workbook. Patient reports feeling "okay" today. She plans to return home at discharge to follow up with [facility] on 6/6.  Transportation Means: Pt reports access to transportation  Supports: No supports mentioned at this time  Tilden Fossa, MSW, [place] Social Worker [employer] [phone]
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
## 2015-03-25 NOTE — Plan of Care (Signed)
Problem: Diagnosis: Increased Risk For Suicide Attempt Goal: STG-Patient Will Comply With Medication Regime Outcome: Progressing Patient has been compliant with scheduled medications.

## 2015-03-25 NOTE — Plan of Care (Signed)
Problem: Diagnosis: Increased Risk For Suicide Attempt Goal: STG-Patient Will Attend All Groups On The Unit Outcome: Progressing Pt attended evening group on 03/25/15.

## 2015-03-26 DIAGNOSIS — F332 Major depressive disorder, recurrent severe without psychotic features: Principal | ICD-10-CM

## 2015-03-26 MED ORDER — DULOXETINE HCL 60 MG PO CPEP
60.0000 mg | ORAL_CAPSULE | Freq: Two times a day (BID) | ORAL | Status: DC
  Administered 2015-03-26 – 2015-03-28 (×4): 60 mg via ORAL
  Filled 2015-03-26 (×6): qty 1
  Filled 2015-03-26 (×2): qty 6
  Filled 2015-03-26: qty 1

## 2015-03-26 MED ORDER — DULOXETINE HCL 30 MG PO CPEP
90.0000 mg | ORAL_CAPSULE | Freq: Every day | ORAL | Status: DC
  Administered 2015-03-26: 90 mg via ORAL
  Filled 2015-03-26 (×3): qty 3

## 2015-03-26 MED ORDER — DULOXETINE HCL 30 MG PO CPEP
30.0000 mg | ORAL_CAPSULE | Freq: Every day | ORAL | Status: DC
  Filled 2015-03-26 (×3): qty 1

## 2015-03-26 MED ORDER — AMPHETAMINE-DEXTROAMPHET ER 10 MG PO CP24
10.0000 mg | ORAL_CAPSULE | Freq: Every day | ORAL | Status: DC
  Administered 2015-03-27: 10 mg via ORAL
  Filled 2015-03-26: qty 1

## 2015-03-26 NOTE — Progress Notes (Signed)
Patient ID: [name], female   DOB: 1965/12/25, 49 y.o.   MRN: [id number] [facility] MD Progress Note  03/26/2015 1:52 PM CIEARRA RUFO  MRN:  [id number] Subjective:  Patient states she continues to be in  " a lot of pain" which she states is both physical and mental. Describes ongoing sadness, emotional lability, vague sense of anxiety.  Denies medication side effects.  Objective : I have discussed case with treatment team and have met with patient.  Patient is a 19 year old married female, admitted for worsening depression and suicidal ideations of cutting self with knife in the context of argument with husband. Describes a history of Depression, worsened by marital stress and chronic pain, which she states is mostly neck and upper back pain related to Degenerative Disc Disease . She does not describe any clear history of mania /hypomania, but does report emotional lability, with mood swings lasting a few minutes. She states she is depressed most of the time, however.  She has a history of Alcohol and Benzodiazepine Dependencies, but has been sober for years . She has been managed  With Cymbalta ( 30- 60 mgrs a day) over recent months, no side effects. Today patient reports ongoing depression, although states she is feeling better. She has had a good visit with her husband , and states this has helped her feel better.  She has been going to groups and is visible on unit . No disruptive behaviors . Responds well to support, encouragement .   Principal Problem: Major depressive disorder, recurrent severe without psychotic features Diagnosis:   Patient Active Problem List   Diagnosis Date Noted  . Major depressive disorder, recurrent severe without psychotic features [F33.2] 03/23/2015  . DDD (degenerative disc disease) [IMO0002]   . ACUTE BRONCHITIS [J20.9] 10/22/2009  . GERD [K21.9] 08/02/2009  . DEGENERATIVE DISC DISEASE [IMO0002] 08/02/2009  . COUGH [R05] 08/02/2009  . BIPOLAR  AFFECTIVE DISORDER [F31.9] 08/01/2009  . ALCOHOLISM [F10.20] 08/01/2009  . BULIMIA [F50.2] 08/01/2009  . HEARING LOSS, BILATERAL [H91.90] 08/01/2009  . OSTEOPENIA [M89.9, M94.9] 08/01/2009  . SEIZURE DISORDER [R56.9] 08/01/2009   Total Time spent with patient: 20 minutes   Past Medical History:  Past Medical History  Diagnosis Date  . Depression   . DDD (degenerative disc disease)   . DDD (degenerative disc disease)   . DDD (degenerative disc disease)   . Pertussis   . Seizures     Past Surgical History  Procedure Laterality Date  . Cervical spine surgery    . Cesarean section    . Tubal ligation    . Tonsillectomy     Family History: History reviewed. No pertinent family history. Social History:  History  Alcohol Use No    Comment: quit drinking 7 months     History  Drug Use No    Comment: last used in 1990's    History   Social History  . Marital Status: Significant Other    Spouse Name: N/A  . Number of Children: N/A  . Years of Education: N/A   Social History Main Topics  . Smoking status: Current Every Day Smoker -- 0.50 packs/day  . Smokeless tobacco: Not on file  . Alcohol Use: No     Comment: quit drinking 7 months  . Drug Use: No     Comment: last used in 1990's  . Sexual Activity: Yes   Other Topics Concern  . None   Social History Narrative   Additional History:  Sleep: improved   Appetite:  Fair   Assessment:   Musculoskeletal: Strength & Muscle Tone: within normal limits Gait & Station: normal Patient leans: normal   Psychiatric Specialty Exam: Physical Exam  ROS chronic back, neck pain, no nausea or vomiting.  Blood pressure 106/81, pulse 94, temperature 98.3 F (36.8 C), temperature source Oral, resp. rate 16, height '5\' 1"'  (1.549 m), weight 131 lb (59.421 kg), last menstrual period 02/28/2015, SpO2 98 %.Body [name] index is 24.76 kg/(m^2).  General Appearance: Fairly Groomed  Engineer, water::  Fair  Speech:  Normal Rate   Volume:  Normal  Mood:  Anxious and Depressed  Affect:  constricted  Thought Process:  Linear  Orientation:  Full (Time, Place, and Person)  Thought Content:  Denies hallucinations, no delusions , ruminative about stressors   Suicidal Thoughts:  No- At this time denies any suicidal plan or intent and contracts for safety on the unit  Homicidal Thoughts:  No  Memory: recent and remote grossly intact   Judgement:  Fair  Insight:  Present  Psychomotor Activity:  Normal  Concentration:  Fair  Recall:  [employer] of Knowledge:Fair  Language: Fair  Akathisia:  No  Handed:  Right  AIMS (if indicated):     Assets:  Desire for Improvement  ADL's:  Intact  Cognition: WNL  Sleep:  Number of Hours: 6.75     Current Medications: Current Facility-Administered Medications  Medication Dose Route Frequency Provider Last Rate Last Dose  . acetaminophen (TYLENOL) tablet 650 mg  650 mg Oral Q4H PRN Delfin Gant, NP   650 mg at 03/26/15 8299  . alum & mag hydroxide-simeth (MAALOX/MYLANTA) 200-200-20 MG/5ML suspension 30 mL  30 mL Oral PRN Delfin Gant, NP      . amphetamine-dextroamphetamine (ADDERALL XR) 24 hr capsule 30 mg  30 mg Oral Daily Nicholaus Bloom, MD   30 mg at 03/26/15 0857  . cyclobenzaprine (FLEXERIL) tablet 5 mg  5 mg Oral TID PRN Delfin Gant, NP   5 mg at 03/26/15 3716  . DULoxetine (CYMBALTA) DR capsule 30 mg  30 mg Oral Daily Jenne Campus, MD   30 mg at 03/26/15 0858  . DULoxetine (CYMBALTA) DR capsule 90 mg  90 mg Oral Daily Jenne Campus, MD   90 mg at 03/26/15 0908  . furosemide (LASIX) tablet 20 mg  20 mg Oral Daily PRN Delfin Gant, NP   20 mg at 03/24/15 0827  . hydrOXYzine (ATARAX/VISTARIL) tablet 50 mg  50 mg Oral Q6H PRN Nicholaus Bloom, MD   50 mg at 03/26/15 0853  . ibuprofen (ADVIL,MOTRIN) tablet 600 mg  600 mg Oral Q8H PRN Delfin Gant, NP   600 mg at 03/25/15 0800  . levothyroxine (SYNTHROID, LEVOTHROID) tablet 25 mcg  25 mcg Oral  QAC breakfast Delfin Gant, NP   25 mcg at 03/26/15 9678  . nicotine polacrilex (NICORETTE) gum 2 mg  2 mg Oral PRN Myer Peer Willy Vorce, MD      . ondansetron (ZOFRAN) tablet 4 mg  4 mg Oral Q8H PRN Delfin Gant, NP      . pantoprazole (PROTONIX) EC tablet 40 mg  40 mg Oral Daily Delfin Gant, NP   40 mg at 03/26/15 0858  . pneumococcal 23 valent vaccine (PNU-IMMUNE) injection 0.5 mL  0.5 mL Intramuscular Tomorrow-1000 Saramma Eappen, MD   0.5 mL at 03/24/15 1047  . potassium chloride (K-DUR) CR tablet 10 mEq  10 mEq Oral Daily PRN Delfin Gant, NP      . pregabalin (LYRICA) capsule 50 mg  50 mg Oral TID Delfin Gant, NP   50 mg at 03/26/15 1204  . traZODone (DESYREL) tablet 100 mg  100 mg Oral QHS PRN Nicholaus Bloom, MD   100 mg at 03/25/15 2115  . trimethoprim (TRIMPEX) tablet 100 mg  100 mg Oral Daily Delfin Gant, NP   100 mg at 03/26/15 0900  . valACYclovir (VALTREX) tablet 500 mg  500 mg Oral Daily Delfin Gant, NP   500 mg at 03/26/15 0900    Lab Results: No results found for this or any previous visit (from the past 48 hour(s)).  Physical Findings: AIMS: Facial and Oral Movements Muscles of Facial Expression: None, normal Lips and Perioral Area: None, normal Jaw: None, normal Tongue: None, normal,Extremity Movements Upper (arms, wrists, hands, fingers): None, normal Lower (legs, knees, ankles, toes): None, normal, Trunk Movements Neck, shoulders, hips: None, normal, Overall Severity Severity of abnormal movements (highest score from questions above): None, normal Incapacitation due to abnormal movements: None, normal Patient's awareness of abnormal movements (rate only patient's report): No Awareness, Dental Status Current problems with teeth and/or dentures?: No Does patient usually wear dentures?: No  CIWA:  CIWA-Ar Total: 1 COWS:  COWS Total Score: 2   Assessment- patient reports ongoing depression, which she attributes at least  partially to marital relationship tension, ongoing ruminations about husband having been unfaithful years ago ,  and to chronic pain.  She has a history of Alcohol Dependence , but has been sober for years .  She has been on Cymbalta for 2 months- without side effects. For pain she has been on Lyrica.  She remains depressed, but acknowledges some improvement and today no SI . No psychotic symptoms.  Of note, patient on Adderall x several months for difficulty concentrating /subjective difficulty with focusing on tasks. We discussed addictive potential of Adderall/Stimulants .    Treatment Plan Summary: Daily contact with patient to assess and evaluate symptoms and progress in treatment and Medication management Will increase Cymbalta to 60 mgrs BID for depression , anxiety. May also help address chronic pain.  Will decrease Adderall to XR 10 mg in AM - patient agrees to gradual taper of potentially addictive medications.  Will continue Lyrica 50 mgrs TID for pain Will continue Trazodone 100 mg HS  PRN for insomnia  Of note, patient on Trimetropim  For history of repeated UTIs- states she has been on this medication for months, no side effects. Also on low dose Synthroid for Hypothyroidism.  Medical Decision Making:  Review of Psycho-Social Stressors (1), Review of Medication Regimen & Side Effects (2) and Review of New Medication or Change in Dosage (2)     Sahian Kerney 03/26/2015, 1:52 PM

## 2015-03-26 NOTE — Plan of Care (Signed)
Problem: Consults Goal: Anxiety Disorder Patient Education See Patient Education Module for eduction specifics.  Outcome: Completed/Met Date Met:  03/26/15 Nurse discussed anxiety/coping skills with patient.

## 2015-03-26 NOTE — Progress Notes (Signed)
Recreation Therapy Notes  Animal-Assisted Activity (AAA) Program Checklist/Progress Notes Patient Eligibility Criteria Checklist & Daily Group note for Rec Tx Intervention  Date: 05.31.16 Time: 2:30pm Location: [address]   AAA/T Program Assumption of Risk Form signed by Patient/ or Parent Legal Guardian yes  Patient is free of allergies or sever asthma yes  Patient reports no fear of animals yes  Patient reports no history of cruelty to animalsyes  Patient understands his/her participation is voluntary yes  Patient washes hands before animal contact yes  Patient washes hands after animal contact yes  Behavioral Response: Engaged  Education: Contractor, Appropriate Animal Interaction   Education Outcome: Acknowledges understanding/In group clarification offered/Needs additional education.   Clinical Observations/Feedback: Patient sat on the floor and pet the dog.  Patient left at 3:03pm but returned.  Patient also asked questions.   Victorino Sparrow, LRT/CTRS     Victorino Sparrow A 03/26/2015 4:32 PM

## 2015-03-26 NOTE — Progress Notes (Addendum)
Pt attended spiritual care group on grief and loss facilitated by chaplain Jerene Pitch. Group opened with brief discussion and psycho-social ed around grief and loss in relationships and in relation to self - identifying life patterns, circumstances, changes that cause losses. Established group norm of speaking from own life experience. Group goal of establishing open and affirming space for members to share loss and experience with grief, normalize grief experience and provide psycho social education and grief support.  Group drew on narrative and Alderian therapeutic modalities.    [name] shared openly with group about loss of her first children as well as loss of relationship.   She is currently living with her spouse.  They have been separated for 3 years and recently decided to try to repair relationship.

## 2015-03-26 NOTE — Progress Notes (Signed)
Patient was in dayroom, stated she felt very anxious.  Nurse talked with patient.  VS taken again.   121/87 P87 sitting and BP 119/79 P89 standing.  Patient stated she was feeling better.  Dinner/drink brought to patient's room.  Patient resting in bed.

## 2015-03-26 NOTE — BHH Suicide Risk Assessment (Signed)
[facility] INPATIENT:  Family/Significant Other Suicide Prevention Education  Suicide Prevention Education:  Education Completed; Kanika Bungert, [place] [phone];  has been identified by the patient as the family member/significant other with whom the patient will be residing, and identified as the person(s) who will aid the patient in the event of a mental health crisis (suicidal ideations/suicide attempt).  With written consent from the patient, the family member/significant other has been provided the following suicide prevention education, prior to the and/or following the discharge of the patient.  The suicide prevention education provided includes the following:  Suicide risk factors  Suicide prevention and interventions  National Suicide Hotline telephone number  [facility] assessment telephone number  [facility] Emergency Assistance [place] and/or Residential Mobile Crisis Unit telephone number  Request made of family/significant other to:  Remove weapons (e.g., guns, rifles, knives), all items previously/currently identified as safety concern.  Husband advised there is a gun in the home but patient does not have access to weapons.     Remove drugs/medications (over-the-counter, prescriptions, illicit drugs), all items previously/currently identified as a safety concern.  The family member/significant other verbalizes understanding of the suicide prevention education information provided.  The family member/significant other agrees to remove the items of safety concern listed above.  Concha Pyo 03/26/2015, 4:25 PM

## 2015-03-26 NOTE — BHH Group Notes (Signed)
The focus of this group is to educate the patient on the purpose and policies of crisis stabilization and provide a format to answer questions about their admission.  The group details unit policies and expectations of patients while admitted.  Patient attended 0900 nurse education orientation group this morning.  Patient actively participated, appropriate affect, alert, appropriate insight and engagement.  Today patient will work on 3 goals for discharge.  

## 2015-03-26 NOTE — Progress Notes (Signed)
D:  Patient's self inventory sheet, patient stated she slept good last night, sleep medication is helpful.  Good appetite, low energy level, concentration in the middle.  Rated depression 4, hopeless 3, anxiety 5.  Denied withdrawals.  Denied SI.  Physical problems of pain, headaches.  Does have physical pain #6 is worst pain, head, back, neck.  Pain med does work somewhat.  Goal is to try to stay positive about going home, get meds regulated.  Will talk with MD/staff.  Does have discharge plans. A:  Medications administered per MD orders.  Emotional support and encouragement given patient. R:  Denied SI and HI, contracts for safety.  Denied A/V hallucinations.  Safety maintained with 15 minute checks.

## 2015-03-26 NOTE — Tx Team (Signed)
Interdisciplinary Treatment Plan Update (Adult)  Date:  03/26/2015  Time Reviewed:  1:04 PM   Progress in Treatment: Attending groups: Patient is attending groups. Participating in groups:  Patient engages in discussion Taking medication as prescribed:  Patient is taking medications Tolerating medication:  Patient is tolerating medications Family/Significant othe contact made:   No, but will ask for consent to make collateral contact Patient understands diagnosis:Yes, patient understands diagnosis and need for treatment Discussing patient identified problems/goals with staff:  Yes, patient is able to express goals/problems Medical problems stabilized or resolved:  Yes Denies suicidal/homicidal ideation: Yes, patient is denying SI/HI. Issues/concerns per patient self-inventory:   Other:  Discharge Plan or Barriers:  Home with follow up with [facility]  Reason for Continuation of Hospitalization: Anxiety Depression Medication stabilization   Comments:  Continue medication stabilization  Additional comments:  Patient and CSW reviewed Patient Discharge Process Letter/Patient Involvement Form.  Patient verbalized understanding and signed form.  Patient and CSW also reviewed and identified patient's goals and treatment plan.  Patient verbalized understanding and agreed to plan.  Estimated length of stay: 2-3 days  New goal(s):  Review of initial/current patient goals per problem list:  Please see plan of careInterdisciplinary Treatment Plan Update (Adult)  Attendees: Patient 03/26/2015 1:04 PM   Family:   03/26/2015 1:04 PM   Physician:  Neita Garnet, MD 03/26/2015 1:04 PM   Nursing:   Grayland Ormond, RN 03/26/2015 1:04 PM   Clinical Social Worker:  Joette Catching, [place] 03/26/2015 1:04 PM   Clinical Social Worker:  Erasmo Downer Drinkard, [place] 03/26/2015 1:04 PM   Case Manager:  Lars Pinks, RN 03/26/2015 1:04 PM   Other:  Festus Aloe, RN 03/26/2015 1:04 PM  Other:    03/26/2015  1:04 PM   Other:  03/26/2015 1:04 PM   Other:  03/26/2015 1:04 PM   Other:  03/26/2015 1:04 PM   Other:  Jake Bathe Transition Team Coordinator 03/26/2015 1:04 PM   Other:   03/26/2015 1:04 PM   Other:  03/26/2015 1:04 PM   Other:   03/26/2015 1:04 PM    Scribe for Treatment Team:   Concha Pyo, 03/26/2015   1:04 PM

## 2015-03-26 NOTE — BHH Group Notes (Signed)
[place] LCSW Group Therapy      Feelings About Diagnosis 1:15 - 2:30 PM         03/26/2015  4:19 PM    Type of Therapy:  Group Therapy  Participation Level:  Active  Participation Quality:  Appropriate  Affect:  Appropriate  Cognitive:  Alert and Appropriate  Insight:  Developing/Improving and Engaged  Engagement in Therapy:  Developing/Improving and Engaged  Modes of Intervention:  Discussion, Education, Exploration, Problem-Solving, Rapport Building, Support  Summary of Progress/Problems:  Patient actively participated in group. Patient discussed past and present diagnosis and the effects it has had on  life.  Patient talked about family and society being judgmental and the stigma associated with having a mental health diagnosis.  She advised of not wanting to accept that she has a mental health diagnosis.  She shared she just does not understand why she had to have this type of illness.  Concha Pyo 03/26/2015  4:19 PM

## 2015-03-27 LAB — TSH: TSH: 0.836 u[IU]/mL (ref 0.350–4.500)

## 2015-03-27 MED ORDER — PRAZOSIN HCL 1 MG PO CAPS
1.0000 mg | ORAL_CAPSULE | Freq: Every day | ORAL | Status: DC
  Administered 2015-03-27: 1 mg via ORAL
  Filled 2015-03-27: qty 3
  Filled 2015-03-27 (×3): qty 1

## 2015-03-27 NOTE — Progress Notes (Signed)
D: Pt has anxious affect and mood.  Pt was in bed resting at the beginning of the shift.  Pt reports "I'm okay, my pressure was up a little bit this afternoon."  Pt made goal with writer to "have a good night."  Pt denies SI/HI, denies hallucinations.  Pt encouraged to rest in her room during evening group since she wasn't feeling well.   A:  Met with pt 1:1 and provided support and encouragement.  Actively listened to pt.  PRN medication administered for pain, sleep, muscle spasms, and anxiety. R: Pt is compliant with medications.  Pt verbally contracts for safety.  Will continue to monitor and assess.

## 2015-03-27 NOTE — Progress Notes (Signed)
Adult Psychoeducational Group Note  Date:  03/27/2015 Time:  11:32 PM  Group Topic/Focus:  Wrap-Up Group:   The focus of this group is to help patients review their daily goal of treatment and discuss progress on daily workbooks.  Participation Level:  Minimal  Participation Quality:  Resistant  Affect:  Depressed and Resistant  Cognitive:  Lacking  Insight: Limited  Engagement in Group:  Limited and Resistant  Modes of Intervention:  Discussion  Additional Comments:  Patient was minimal at participating in group and asked about medication samples upon discharge. She did say her goal was to get ready for discharge.  [name] 03/27/2015, 11:32 PM

## 2015-03-27 NOTE — Progress Notes (Signed)
Follow up with Viki on [address].  Viki reports some apprehension about going home tomorrow.  Anxiety around "going back to the real world."  Spoke with chaplain about coping mechanisms when feeling overwhelmed.  Her faith figures prominently in her coping and she is hoping to look to some members of her church for support.  Viki spoke specifically about her relationship with husband, from whom she had been separated for three years.   Described his "wanting to put affair behind [place]" and saying "its all in the past."  Viki states she still has not grieved this affair and is unsure how to move forward with relationship.   Spoke with chaplain about marriage and family counseling in community.  Spoke with chaplain about changing behaviors at home and possible responses from family members to these changes - especially 49 year old son.  Son was born premature and Dub Mikes states that she has sheltered him.  She is hopeful to help son step into having increased responsibility.    Chaplain normalized process in rebuilding relationship, anxiety from other family members as Viki changes the way she relates to them.    Provided contact with [facility] counseling clinic for community follow up around marriage and family counseling.   [place], [place]

## 2015-03-27 NOTE — Progress Notes (Signed)
Patient ID: Terrance Mass, female   DOB: July 20, 1966, 49 y.o.   MRN: [id number] D: Patient denies SI/HI and auditory and visual hallucinations. Patient has a depressed mood and affect. Depression rated 4 on 1 to 10 scale, hopelessness 2 on 1 to 10 scale, anxiety 5 on 1 to 10 scale. Patient reports she had bad dreams last night but did not awaken Reports good appetite and low energy. Reports back and neck pain.  A: Patient given emotional support from RN. Patient given medications per MD orders. Patient encouraged to attend groups and unit activities. Patient encouraged to come to staff with any questions or concerns.  R: Patient remains cooperative and appropriate. Will continue to monitor patient for safety.

## 2015-03-27 NOTE — Progress Notes (Signed)
Patient in day room with her visitor at the beginning of this shift. She endorsed a great day. Although complaint of back pain and rated her pain at 7 on the scale of 1-10 with 10 the worst. She denied SI/HI and denied Hallucinations. Writer encouraged and supported patient. Flexeril given for the muscle pain. Patient receptive to encouragement and support. Q 15 minute check continues as ordered to maintain safety.

## 2015-03-27 NOTE — Progress Notes (Signed)
Recreation Therapy Notes  Date: 06.01.16 Time: 9:30am Location: [address] Dayroom  Group Topic: Stress Management  Goal Area(s) Addresses:  Patient will verbalize importance of using healthy stress management.  Patient will identify positive emotions associated with healthy stress management.    Behavioral Response:  Engaged  Intervention: [employer]  Activity : Guided Automotive engineer. LRT introduced and educated patients on stress management technique of guided imagery.  A script was used to deliver the technique to patients.  Patients were asked to follow script read by LRT to engage in practicing stress management technique.   Education:  Stress Management, Discharge Planning.   Education Outcome: Acknowledges edcuation/In group clarification offered/Needs additional education  Clinical Observations/Feedback: Patient attended group and was engaged.  Victorino Sparrow, LRT/CTRS         Victorino Sparrow A 03/27/2015 3:38 PM
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
## 2015-03-27 NOTE — BHH Group Notes (Signed)
[facility] LCSW Aftercare Discharge Planning Group Note   03/27/2015 1:08 PM    Participation Quality:  Appropraite  Mood/Affect:  Appropriate  Depression Rating:  4  Anxiety Rating:  5  Thoughts of Suicide:  No  Will you contract for safety?   NA  Current AVH:  No  Plan for Discharge/Comments:  Patient attended discharge planning group and actively participated in group. She will follow up with [facility] at discharge.  Suicide prevention education reviewed and SPE document provided.   Transportation Means: Patient has transportation.   Supports:  Patient has a support system.   [name], Eulas Post
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
## 2015-03-28 MED ORDER — PREGABALIN 50 MG PO CAPS: 50.0000 mg | ORAL_CAPSULE | Freq: Three times a day (TID) | ORAL | Status: DC

## 2015-03-28 MED ORDER — PRAZOSIN HCL 1 MG PO CAPS: 1.0000 mg | ORAL_CAPSULE | Freq: Every day | ORAL | Status: DC

## 2015-03-28 MED ORDER — FUROSEMIDE 20 MG PO TABS: 20.0000 mg | ORAL_TABLET | Freq: Every day | ORAL | Status: DC | PRN

## 2015-03-28 MED ORDER — VALACYCLOVIR HCL 500 MG PO TABS: 500.0000 mg | ORAL_TABLET | Freq: Every day | ORAL | Status: DC

## 2015-03-28 MED ORDER — LEVETIRACETAM 500 MG PO TABS: 500.0000 mg | ORAL_TABLET | Freq: Two times a day (BID) | ORAL | Status: DC

## 2015-03-28 MED ORDER — TRIMETHOPRIM 100 MG PO TABS: 100.0000 mg | ORAL_TABLET | Freq: Every day | ORAL | Status: DC

## 2015-03-28 MED ORDER — OMEPRAZOLE 20 MG PO CPDR: 20.0000 mg | DELAYED_RELEASE_CAPSULE | Freq: Every day | ORAL | Status: DC | PRN

## 2015-03-28 MED ORDER — HYDROXYZINE HCL 50 MG PO TABS: 50.0000 mg | ORAL_TABLET | Freq: Four times a day (QID) | ORAL | Status: DC | PRN

## 2015-03-28 MED ORDER — DULOXETINE HCL 60 MG PO CPEP: 60.0000 mg | ORAL_CAPSULE | Freq: Two times a day (BID) | ORAL | Status: DC

## 2015-03-28 MED ORDER — TRAZODONE HCL 100 MG PO TABS: 100.0000 mg | ORAL_TABLET | Freq: Every evening | ORAL | Status: DC | PRN

## 2015-03-28 MED ORDER — LEVOTHYROXINE SODIUM 25 MCG PO TABS: 25.0000 ug | ORAL_TABLET | Freq: Every day | ORAL | Status: DC

## 2015-03-28 MED ORDER — POTASSIUM CHLORIDE ER 10 MEQ PO TBCR: 10.0000 meq | EXTENDED_RELEASE_TABLET | Freq: Every day | ORAL | Status: DC | PRN

## 2015-03-28 NOTE — Progress Notes (Signed)
  [facility] Adult Case Management Discharge Plan :  Will you be returning to the same living situation after discharge:  Yes,  Patient is returning home. At discharge, do you have transportation home?: Yes,  Patient to arrange transportation home. Do you have the ability to pay for your medications: Yes,  Patient is able to tobtain medications.  Release of information consent forms completed and in the chart;  Patient's signature needed at discharge.  Patient to Follow up at: Follow-up Information    Follow up with Eino Farber - [facility] On 04/05/2015.   Why:  You are scheduled with Eino Farber on Friday, April 05, 2015 at 1:20   Contact information:   [address] [place], [place]   [address]  [phone]      Follow up with [facility] On 04/15/2015.   Why:  You are scheduled with Kandra Nicolas on Monday, April 15, 2015 at [facility] information:   [address] [place], [place]   [address]  [phone]      Patient denies SI/HI: Patient no longer endorsing SI/HI or other thoughts of self harm.   Safety Planning and Suicide Prevention discussed:.Reviewed with all patients during discharge planning group   Have you used any form of tobacco in the last 30 days? (Cigarettes, Smokeless Tobacco, Cigars, and/or Pipes): No  Has patient been referred to the Quitline?  No referral needed to quitline.   Concha Pyo 03/28/2015, 4:28 PM

## 2015-03-28 NOTE — Progress Notes (Signed)
Patient ID: [name], female   DOB: 1966-03-13, 49 y.o.   MRN: [id number] D-completed self inventory and gave self a 5 on feelings of depression and a 2 for hopelessness and a 7 for anxiety. Complained this am about nausea and gave her a Zofran with relief for the complaint. She stated she vomited a small amount but is was unwitnessed.  A-Medications as ordered monitored for safety, support offered.  R- She had planned for discharge today and was feeling anxious about going home as her husband as only recently returned to the home and they are trying to work things out. She is not being discharged today and is relieved.

## 2015-03-28 NOTE — BHH Suicide Risk Assessment (Signed)
[facility] Discharge Suicide Risk Assessment   Demographic Factors:  49 year old married female, lives at home with family  Total Time spent with patient: 30 minutes  Musculoskeletal: Strength & Muscle Tone: within normal limits Gait & Station: normal Patient leans: N/A  Psychiatric Specialty Exam: Physical Exam  ROS  Blood pressure 92/69, pulse 115, temperature 97.7 F (36.5 C), temperature source Oral, resp. rate 16, height 5\' 1"  (1.549 m), weight 131 lb (59.421 kg), last menstrual period 02/28/2015, SpO2 98 %.Body mass index is 24.76 kg/(m^2).  General Appearance: improved grooming  Eye Contact::  Good  Speech:  Normal Rate409  Volume:  Normal  Mood:  improved mood, more reactive affect, smiles at times appropriately, some ongoing anxiety  Affect:  Appropriate and reactive  Thought Process:  Goal Directed and Linear  Orientation:  Full (Time, Place, and Person)  Thought Content:  no hallucinations, no delusions, not internally preoccupied   Suicidal Thoughts:  No- denies any thoughts of harming self or anyone else and contracts for safety on the unit .  Homicidal Thoughts:  No  Memory:  recent and remote grossly intact   Judgement:  Other:  improved   Insight:  Present  Psychomotor Activity:  Normal  Concentration:  Good  Recall:  Good  Fund of Knowledge:Good  Language: Good  Akathisia:  Negative  Handed:  Right  AIMS (if indicated):     Assets:  Communication Skills Desire for Improvement Resilience  Sleep:  Number of Hours: 6.75  Cognition: WNL  ADL's: improved     Have you used any form of tobacco in the last 30 days? (Cigarettes, Smokeless Tobacco, Cigars, and/or Pipes): No  Has this patient used any form of tobacco in the last 30 days? (Cigarettes, Smokeless Tobacco, Cigars, and/or Pipes) No  Mental Status Per Nursing Assessment::   On Admission:     Current Mental Status by Physician: Patient improved compared to admission- well groomed, mood improved, affect  fuller in range- remains somewhat anxious, but responsive to support, empathy, and anxiety decreases with support. No thought disorder, no suicidal ideations, no homicidal ideations, no psychotic symptoms, future oriented. Behavior on unit calm and in good control.   Loss Factors: Marital tension related to husband being unfaithful in the past . Chronic pain .   Historical Factors:  History of mood disorder- of note, states she has been diagnosed with Bipolar Disorder in the past, but does not endorse any clear history of mania, hypomania- rather describes brief , short lived mood swings during the course of a day, often related to daily stressors .   Risk Reduction Factors:   Responsible for children under 76 years of age, Sense of responsibility to family, Living with another person, especially a relative and Positive coping skills or problem solving skills  Continued Clinical Symptoms:  Mood is improved, affect improved, but does have some ongoing anxiety. States she wants to preserve marriage and loves husband, but " can't get over " his episode of infidelity, separation from her  A few years ago.  Of note denies any homicidal or violent ideations towards husband or anyone else. States she feels she has learnt tools to better address negative emotions and family stress . Denies medication side effects.  Cognitive Features That Contribute To Risk:  No gross cognitive deficits noted upon discharge. Is alert , attentive, and oriented x 3   Suicide Risk:  Mild:  Suicidal ideation of limited frequency, intensity, duration, and specificity.  There are no  identifiable plans, no associated intent, mild dysphoria and related symptoms, good self-control (both objective and subjective assessment), few other risk factors, and identifiable protective factors, including available and accessible social support.  Principal Problem: Major depressive disorder, recurrent severe without psychotic  features Discharge Diagnoses:  Patient Active Problem List   Diagnosis Date Noted  . Major depressive disorder, recurrent severe without psychotic features [F33.2] 03/23/2015  . DDD (degenerative disc disease) [IMO0002]   . ACUTE BRONCHITIS [J20.9] 10/22/2009  . GERD [K21.9] 08/02/2009  . DEGENERATIVE DISC DISEASE [IMO0002] 08/02/2009  . COUGH [R05] 08/02/2009  . BIPOLAR AFFECTIVE DISORDER [F31.9] 08/01/2009  . ALCOHOLISM [F10.20] 08/01/2009  . BULIMIA [F50.2] 08/01/2009  . HEARING LOSS, BILATERAL [H91.90] 08/01/2009  . OSTEOPENIA [M89.9, M94.9] 08/01/2009  . SEIZURE DISORDER [R56.9] 08/01/2009    Follow-up Information    Follow up with Eino Farber - [facility] On 04/05/2015.   Why:  You are scheduled with Eino Farber on Friday, April 05, 2015 at 1:20   Contact information:   [address] [place], [place]   [address]  [phone]      Follow up with [facility] On 04/15/2015.   Why:  You are scheduled with Kandra Nicolas on Monday, April 15, 2015 at [facility] information:   [address] [place], [place]   [address]  [phone]      Plan Of Care/Follow-up recommendations:  Activity:  as tolerated Diet:  Regular- allergic to shrimp Tests:  NA Other:  See below  Is patient on multiple antipsychotic therapies at discharge:  No   Has Patient had three or more failed trials of antipsychotic monotherapy by history:  No  Recommended Plan for Multiple Antipsychotic Therapies: NA   Patient is being discharged in good spirits, plans to return home. She is planning on engaging in individual psychotherapy and also in going to [place] with husband Follow up as above     Iretha Kirley, [place] 03/28/2015, 3:27 PM

## 2015-03-28 NOTE — Progress Notes (Signed)
Patient ID: [name], female   DOB: [birth year], 49 y.o.   MRN: [id number] Discharge Note-Seen by Dr. Parke Poisson and discharged home. She is a little anxious re discharge, but states is glad also to go home and see her family. Her husband came to pick her up.Reveiwed all of her discharge instructions and her medicines and samples given to her along with prescriptions. All property returned to her. She denies any thoughts of suicide or homicide. Escorted to lobby for discharge where husband was waiting.
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
## 2015-03-29 NOTE — Discharge Summary (Signed)
Physician Discharge Summary Note  Patient:  [name] is an 49 y.o., female MRN:  [id number] DOB:  Feb 06, 1966 Patient phone:  [phone] (home)  Patient address:   [place] [address],  Total Time spent with patient: 45 minutes  Date of Admission:  03/23/2015 Date of Discharge: 03/28/2015  Reason for Admission:  Suicidal ideation  Principal Problem: Major depressive disorder, recurrent severe without psychotic features Discharge Diagnoses: Patient Active Problem List   Diagnosis Date Noted  . Major depressive disorder, recurrent severe without psychotic features [F33.2] 03/23/2015  . DDD (degenerative disc disease) [IMO0002]   . ACUTE BRONCHITIS [J20.9] 10/22/2009  . GERD [K21.9] 08/02/2009  . DEGENERATIVE DISC DISEASE [IMO0002] 08/02/2009  . COUGH [R05] 08/02/2009  . BIPOLAR AFFECTIVE DISORDER [F31.9] 08/01/2009  . ALCOHOLISM [F10.20] 08/01/2009  . BULIMIA [F50.2] 08/01/2009  . HEARING LOSS, BILATERAL [H91.90] 08/01/2009  . OSTEOPENIA [M89.9, M94.9] 08/01/2009  . SEIZURE DISORDER [R56.9] 08/01/2009    Musculoskeletal: Strength & Muscle Tone: within normal limits Gait & Station: normal Patient leans: N/A  Psychiatric Specialty Exam:  SEE SRA Physical Exam  Vitals reviewed.   Review of Systems  All other systems reviewed and are negative.   Blood pressure 92/69, pulse 115, temperature 97.7 F (36.5 C), temperature source Oral, resp. rate 16, height 5\' 1"  (1.549 m), weight 59.421 kg (131 lb), last menstrual period 02/28/2015, SpO2 98 %.Body [name] index is 24.76 kg/(m^2).  Have you used any form of tobacco in the last 30 days? (Cigarettes, Smokeless Tobacco, Cigars, and/or Pipes): No  Has this patient used any form of tobacco in the last 30 days? (Cigarettes, Smokeless Tobacco, Cigars, and/or Pipes) N/A  Past Medical History:  Past Medical History  Diagnosis Date  . Depression   . DDD (degenerative disc disease)   . DDD (degenerative disc  disease)   . DDD (degenerative disc disease)   . Pertussis   . Seizures     Past Surgical History  Procedure Laterality Date  . Cervical spine surgery    . Cesarean section    . Tubal ligation    . Tonsillectomy     Family History: History reviewed. No pertinent family history. Social History:  History  Alcohol Use No    Comment: quit drinking 7 months     History  Drug Use No    Comment: last used in 1990's    History   Social History  . Marital Status: Significant Other    Spouse Name: N/A  . Number of Children: N/A  . Years of Education: N/A   Social History Main Topics  . Smoking status: Current Every Day Smoker -- 0.50 packs/day  . Smokeless tobacco: Not on file  . Alcohol Use: No     Comment: quit drinking 7 months  . Drug Use: No     Comment: last used in 1990's  . Sexual Activity: Yes   Other Topics Concern  . None   Social History Narrative   Risk to Self: Is patient at risk for suicide?: Yes What has been your use of drugs/alcohol within the last 12 months?: No alcohol in 2 years and only prescription use of pain meds Risk to Others:   Prior Inpatient Therapy:   Prior Outpatient Therapy:    Level of Care:  OP  Hospital Course:  [name], 49 year old came in to ED verbalizing suicidal ideation.  She stated that emotional fight with husband caused her to have suicidal thoughts.  States  that she was having suicidal thoughts "I tried to cut myself but the knife was to dull and my husband caught me."  [name] was admitted for Major depressive disorder, recurrent severe without psychotic features and crisis management.  She was treated discharged with the medications listed below under Medication List.  Medical problems were identified and treated as needed.  Home medications were restarted as appropriate.  Improvement was monitored by observation and [name] daily report of symptom reduction.  Emotional and mental status  was monitored by daily self-inventory reports completed by [name] and clinical staff.         [name] was evaluated by the treatment team for stability and plans for continued recovery upon discharge.  [name] motivation was an integral factor for scheduling further treatment.  Employment, transportation, bed availability, health status, family support, and any pending legal issues were also considered during her hospital stay.  She was offered further treatment options upon discharge including but not limited to Residential, Intensive Outpatient, and Outpatient treatment.  [name] will follow up with the services as listed below under Follow Up Information.     Upon completion of this admission the patient was both mentally and medically stable for discharge denying suicidal/homicidal ideation, auditory/visual/tactile hallucinations, delusional thoughts and paranoia.      Consults:  psychiatry  Significant Diagnostic Studies:  labs: per ED.  UDS opiates and amphetamines positive  Discharge Vitals:   Blood pressure 92/69, pulse 115, temperature 97.7 F (36.5 C), temperature source Oral, resp. rate 16, height 5\' 1"  (1.549 m), weight 59.421 kg (131 lb), last menstrual period 02/28/2015, SpO2 98 %. Body [name] index is 24.76 kg/(m^2). Lab Results:   Results for orders placed or performed during the hospital encounter of 03/23/15 (from the past 72 hour(s))  TSH     Status: None   Collection Time: 03/27/15  6:32 AM  Result Value Ref Range   TSH 0.836 0.350 - 4.500 uIU/mL    Comment: Performed at [facility]    Physical Findings: AIMS: Facial and Oral Movements Muscles of Facial Expression: None, normal Lips and Perioral Area: None, normal Jaw: None, normal Tongue: None, normal,Extremity Movements Upper (arms, wrists, hands, fingers): None, normal Lower (legs, knees, ankles, toes): None, normal, Trunk Movements Neck,  shoulders, hips: None, normal, Overall Severity Severity of abnormal movements (highest score from questions above): None, normal Incapacitation due to abnormal movements: None, normal Patient's awareness of abnormal movements (rate only patient's report): No Awareness, Dental Status Current problems with teeth and/or dentures?: No Does patient usually wear dentures?: No  CIWA:  CIWA-Ar Total: 1 COWS:  COWS Total Score: 2   See Psychiatric Specialty Exam and Suicide Risk Assessment completed by Attending Physician prior to discharge.  Discharge destination:  Home  Is patient on multiple antipsychotic therapies at discharge:  No   Has Patient had three or more failed trials of antipsychotic monotherapy by history:  No  Recommended Plan for Multiple Antipsychotic Therapies: NA     Medication List    STOP taking these medications        acetaminophen 325 MG tablet  Commonly known as:  TYLENOL     amphetamine-dextroamphetamine 25 MG 24 hr capsule  Commonly known as:  ADDERALL XR     cyclobenzaprine 5 MG tablet  Commonly known as:  FLEXERIL     EPINEPHrine 0.3 mg/0.3 mL Soaj injection  Commonly known as:  EPIPEN 2-PAK  Oxycodone HCl 10 MG Tabs     SALINE NA      TAKE these medications      Indication   DULoxetine 60 MG capsule  Commonly known as:  CYMBALTA  Take 1 capsule (60 mg total) by mouth 2 (two) times daily.   Indication:  Major Depressive Disorder     furosemide 20 MG tablet  Commonly known as:  LASIX  Take 1 tablet (20 mg total) by mouth daily as needed for fluid.   Indication:  Edema, High Blood Pressure     hydrOXYzine 50 MG tablet  Commonly known as:  ATARAX/VISTARIL  Take 1 tablet (50 mg total) by mouth every 6 (six) hours as needed for anxiety.   Indication:  Anxiety Neurosis     levETIRAcetam 500 MG tablet  Commonly known as:  KEPPRA  Take 1 tablet (500 mg total) by mouth 2 (two) times daily.   Indication:  seizure     levothyroxine 25 MCG  tablet  Commonly known as:  SYNTHROID, LEVOTHROID  Take 1 tablet (25 mcg total) by mouth daily before breakfast.   Indication:  Underactive Thyroid     omeprazole 20 MG capsule  Commonly known as:  PRILOSEC  Take 1 capsule (20 mg total) by mouth daily as needed (heartburn).   Indication:  Gastroesophageal Reflux Disease     potassium chloride 10 MEQ tablet  Commonly known as:  K-DUR  Take 1 tablet (10 mEq total) by mouth daily as needed (with furosemide).   Indication:  Low Amount of Potassium in the Blood     prazosin 1 MG capsule  Commonly known as:  MINIPRESS  Take 1 capsule (1 mg total) by mouth at bedtime.   Indication:  nightmares     pregabalin 50 MG capsule  Commonly known as:  LYRICA  Take 1 capsule (50 mg total) by mouth 3 (three) times daily.   Indication:  Neuropathic Pain     traZODone 100 MG tablet  Commonly known as:  DESYREL  Take 1 tablet (100 mg total) by mouth at bedtime as needed for sleep.   Indication:  Trouble Sleeping     trimethoprim 100 MG tablet  Commonly known as:  TRIMPEX  Take 1 tablet (100 mg total) by mouth daily.   Indication:  Urinary Tract Infection     valACYclovir 500 MG tablet  Commonly known as:  VALTREX  Take 1 tablet (500 mg total) by mouth daily.   Indication:  Shingles           Follow-up Information    Follow up with Eino Farber [facility] On 04/05/2015.   Why:  You are scheduled with Eino Farber on Friday, April 05, 2015 at 1:20   Contact information:   [address] [place], [place]   [address]  [phone]      Follow up with [facility] On 04/15/2015.   Why:  You are scheduled with Kandra Nicolas on Monday, April 15, 2015 at [facility] information:   [address] [place], [place]   [address]  [phone]      Follow-up recommendations:  Activity:  as tol, diet as tol  Comments:  1.  Take all your medications as prescribed.              2.  Report any adverse side effects  to outpatient provider.  3.  Patient instructed to not use alcohol or illegal drugs while on prescription medicines.            4.  In the event of worsening symptoms, instructed patient to call 911, the crisis hotline or go to nearest emergency room for evaluation of symptoms.  Total Discharge Time: 40 min  Signed: Freda Munro May Agustin AGNP-BC 03/29/2015, 2:04 PM   Patient seen, Suicide Assessment Completed.  Disposition Plan Reviewed

## 2015-04-02 DIAGNOSIS — M47816 Spondylosis without myelopathy or radiculopathy, lumbar region: Secondary | ICD-10-CM | POA: Diagnosis not present

## 2015-04-02 DIAGNOSIS — M5412 Radiculopathy, cervical region: Secondary | ICD-10-CM | POA: Diagnosis not present

## 2015-04-02 DIAGNOSIS — M5416 Radiculopathy, lumbar region: Secondary | ICD-10-CM | POA: Diagnosis not present

## 2015-04-02 DIAGNOSIS — M47812 Spondylosis without myelopathy or radiculopathy, cervical region: Secondary | ICD-10-CM | POA: Diagnosis not present

## 2015-04-02 DIAGNOSIS — Z6826 Body mass index (BMI) 26.0-26.9, adult: Secondary | ICD-10-CM | POA: Diagnosis not present

## 2015-04-05 DIAGNOSIS — F39 Unspecified mood [affective] disorder: Secondary | ICD-10-CM | POA: Diagnosis not present

## 2015-04-05 DIAGNOSIS — F902 Attention-deficit hyperactivity disorder, combined type: Secondary | ICD-10-CM | POA: Diagnosis not present

## 2015-04-13 ENCOUNTER — Encounter (HOSPITAL_COMMUNITY): Payer: Self-pay | Admitting: *Deleted

## 2015-04-13 ENCOUNTER — Emergency Department (HOSPITAL_COMMUNITY)
Admission: EM | Admit: 2015-04-13 | Discharge: 2015-04-13 | Disposition: A | Discharge status: Home / Self Care | Payer: Commercial Managed Care - HMO | Attending: Emergency Medicine | Admitting: Emergency Medicine

## 2015-04-13 DIAGNOSIS — Z8739 Personal history of other diseases of the musculoskeletal system and connective tissue: Secondary | ICD-10-CM | POA: Insufficient documentation

## 2015-04-13 DIAGNOSIS — F329 Major depressive disorder, single episode, unspecified: Secondary | ICD-10-CM | POA: Diagnosis not present

## 2015-04-13 DIAGNOSIS — Z8619 Personal history of other infectious and parasitic diseases: Secondary | ICD-10-CM | POA: Insufficient documentation

## 2015-04-13 DIAGNOSIS — Z792 Long term (current) use of antibiotics: Secondary | ICD-10-CM | POA: Diagnosis not present

## 2015-04-13 DIAGNOSIS — Z720 Tobacco use: Secondary | ICD-10-CM | POA: Insufficient documentation

## 2015-04-13 DIAGNOSIS — G40909 Epilepsy, unspecified, not intractable, without status epilepticus: Secondary | ICD-10-CM | POA: Insufficient documentation

## 2015-04-13 DIAGNOSIS — Z79899 Other long term (current) drug therapy: Secondary | ICD-10-CM | POA: Diagnosis not present

## 2015-04-13 DIAGNOSIS — L03116 Cellulitis of left lower limb: Secondary | ICD-10-CM | POA: Insufficient documentation

## 2015-04-13 DIAGNOSIS — R110 Nausea: Secondary | ICD-10-CM | POA: Diagnosis not present

## 2015-04-13 DIAGNOSIS — M79672 Pain in left foot: Secondary | ICD-10-CM | POA: Diagnosis present

## 2015-04-13 MED ORDER — CLINDAMYCIN HCL 300 MG PO CAPS: 300.0000 mg | ORAL_CAPSULE | Freq: Four times a day (QID) | ORAL | Status: DC

## 2015-04-13 MED ORDER — CLINDAMYCIN HCL 300 MG PO CAPS
300.0000 mg | ORAL_CAPSULE | Freq: Once | ORAL | Status: AC
  Administered 2015-04-13: 300 mg via ORAL
  Filled 2015-04-13: qty 1

## 2015-04-13 NOTE — ED Notes (Signed)
The pt is c/o her lt foot being bitten by something yesterday.  Lt lat foot red and swollen laterally since then no temp  lmp June 5th

## 2015-04-13 NOTE — ED Notes (Signed)
Pt discharged in stable condition. Discharge papers explained. Pt with no questions.

## 2015-04-13 NOTE — Discharge Instructions (Signed)

## 2015-04-13 NOTE — ED Provider Notes (Signed)
CSN: [id number]     Arrival date & time 04/13/15  0020 History  This chart was scribed for Debby Freiberg, MD by Peyton Bottoms, ED Scribe. This patient was seen in room D31C/D31C and the patient's care was started at 12:48 AM.   Chief Complaint  Patient presents with  . Foot Pain   Patient is a 49 y.o. female presenting with lower extremity pain. The history is provided by the patient. No language interpreter was used.  Foot Pain    HPI Comments: [name] is a 49 y.o. female who presents to the Emergency Department complaining of gradually worsening raised erythematous bump to left foot onset yesterday. Patient states that she spent time outdoors but denies sudden onset of bump or a stinging sensation from an insect bite. She reports associated nausea. She denies associated emesis or fever.Timing constant.  Pain exacerbated by movement, palpation.  Past Medical History  Diagnosis Date  . Depression   . DDD (degenerative disc disease)   . DDD (degenerative disc disease)   . DDD (degenerative disc disease)   . Pertussis   . Seizures    Past Surgical History  Procedure Laterality Date  . Cervical spine surgery    . Cesarean section    . Tubal ligation    . Tonsillectomy     No family history on file. History  Substance Use Topics  . Smoking status: Current Every Day Smoker -- 0.50 packs/day  . Smokeless tobacco: Not on file  . Alcohol Use: No     Comment: quit drinking 7 months   OB History    No data available     Review of Systems  Skin:       Raised erythematous bump to left foot.  All other systems reviewed and are negative.  Allergies  Shrimp  Home Medications   Prior to Admission medications   Medication Sig Start Date End Date Taking? Authorizing Provider  clindamycin (CLEOCIN) 300 MG capsule Take 1 capsule (300 mg total) by mouth 4 (four) times daily. X 7 days 04/13/15   Debby Freiberg, MD  DULoxetine (CYMBALTA) 60 MG capsule Take 1 capsule (60  mg total) by mouth 2 (two) times daily. 03/28/15   Kerrie Buffalo, NP  furosemide (LASIX) 20 MG tablet Take 1 tablet (20 mg total) by mouth daily as needed for fluid. 03/28/15   Kerrie Buffalo, NP  hydrOXYzine (ATARAX/VISTARIL) 50 MG tablet Take 1 tablet (50 mg total) by mouth every 6 (six) hours as needed for anxiety. 03/28/15   Kerrie Buffalo, NP  levETIRAcetam (KEPPRA) 500 MG tablet Take 1 tablet (500 mg total) by mouth 2 (two) times daily. 03/28/15   Kerrie Buffalo, NP  levothyroxine (SYNTHROID, LEVOTHROID) 25 MCG tablet Take 1 tablet (25 mcg total) by mouth daily before breakfast. 03/28/15   Kerrie Buffalo, NP  omeprazole (PRILOSEC) 20 MG capsule Take 1 capsule (20 mg total) by mouth daily as needed (heartburn). 03/28/15   Kerrie Buffalo, NP  potassium chloride (K-DUR) 10 MEQ tablet Take 1 tablet (10 mEq total) by mouth daily as needed (with furosemide). 03/28/15   Kerrie Buffalo, NP  prazosin (MINIPRESS) 1 MG capsule Take 1 capsule (1 mg total) by mouth at bedtime. 03/28/15   Kerrie Buffalo, NP  pregabalin (LYRICA) 50 MG capsule Take 1 capsule (50 mg total) by mouth 3 (three) times daily. 03/28/15   Kerrie Buffalo, NP  traZODone (DESYREL) 100 MG tablet Take 1 tablet (100 mg total) by mouth at bedtime as needed  for sleep. 03/28/15   Kerrie Buffalo, NP  trimethoprim (TRIMPEX) 100 MG tablet Take 1 tablet (100 mg total) by mouth daily. 03/28/15   Kerrie Buffalo, NP  valACYclovir (VALTREX) 500 MG tablet Take 1 tablet (500 mg total) by mouth daily. 03/28/15   Kerrie Buffalo, NP   Triage Vitals: BP 117/72 mmHg  Pulse 103  Temp(Src) 98 F (36.7 C) (Oral)  Resp 16  Wt 139 lb (63.05 kg)  SpO2 96%  LMP 03/31/2015  Physical Exam  Constitutional: She is oriented to person, place, and time. She appears well-developed and well-nourished.  HENT:  Head: Normocephalic and atraumatic.  Right Ear: External ear normal.  Left Ear: External ear normal.  Eyes: Conjunctivae and EOM are normal. Pupils are equal, round, and reactive  to light.  Neck: Normal range of motion. Neck supple.  Cardiovascular: Normal rate, regular rhythm, normal heart sounds and intact distal pulses.   Pulmonary/Chest: Effort normal and breath sounds normal.  Abdominal: Soft. Bowel sounds are normal. There is no tenderness.  Musculoskeletal: Normal range of motion.  Neurological: She is alert and oriented to person, place, and time.  Skin: Skin is warm and dry.  5 cm erythema over dorsal L foot, 2+ DP and PT pulse, no fluctuance indicative of abscess  Vitals reviewed.   ED Course  Procedures (including critical care time)  DIAGNOSTIC STUDIES: Oxygen Saturation is 96% on RA, normal by my interpretation.    COORDINATION OF CARE: 12:50 AM- Discussed plans to mark bump and will give patient clindamycin. Advised patient to return to ED or urgent care tomorrow if size increases or pt has onset of new symptoms including fevers and emesis. Pt advised of plan for treatment and pt agrees.  Labs Review Labs Reviewed - No data to display  Imaging Review No results found.   EKG Interpretation None     MDM   Final diagnoses:  Cellulitis of left lower extremity    49 y.o. female without pertinent PMH presents with L foot cellulitis.  No systemic symptoms with exception of possible mild nausea.  No fevers.  Well appearing, taking PO.  Given dose of clinda in dept.  Encouraged close fu (1-2 days) for wound check and strict compliance with abx.    I have reviewed all laboratory and imaging studies if ordered as above  1. Cellulitis of left lower extremity          Debby Freiberg, MD 04/13/15 [phone]

## 2015-04-14 DIAGNOSIS — L03116 Cellulitis of left lower limb: Secondary | ICD-10-CM | POA: Diagnosis not present

## 2015-04-18 DIAGNOSIS — L03116 Cellulitis of left lower limb: Secondary | ICD-10-CM | POA: Diagnosis not present

## 2015-05-02 ENCOUNTER — Encounter (HOSPITAL_COMMUNITY): Payer: Self-pay | Admitting: Emergency Medicine

## 2015-05-02 ENCOUNTER — Emergency Department (HOSPITAL_COMMUNITY): Payer: Commercial Managed Care - HMO

## 2015-05-02 ENCOUNTER — Emergency Department (HOSPITAL_COMMUNITY)
Admission: EM | Admit: 2015-05-02 | Discharge: 2015-05-02 | Disposition: A | Discharge status: Other Acute Inpatient Hospital | Payer: Commercial Managed Care - HMO | Attending: Emergency Medicine | Admitting: Emergency Medicine

## 2015-05-02 DIAGNOSIS — R600 Localized edema: Secondary | ICD-10-CM | POA: Diagnosis not present

## 2015-05-02 DIAGNOSIS — Z79899 Other long term (current) drug therapy: Secondary | ICD-10-CM | POA: Insufficient documentation

## 2015-05-02 DIAGNOSIS — F32A Depression, unspecified: Secondary | ICD-10-CM

## 2015-05-02 DIAGNOSIS — F29 Unspecified psychosis not due to a substance or known physiological condition: Secondary | ICD-10-CM | POA: Diagnosis not present

## 2015-05-02 DIAGNOSIS — R4182 Altered mental status, unspecified: Secondary | ICD-10-CM | POA: Diagnosis not present

## 2015-05-02 DIAGNOSIS — F1110 Opioid abuse, uncomplicated: Secondary | ICD-10-CM | POA: Insufficient documentation

## 2015-05-02 DIAGNOSIS — Z720 Tobacco use: Secondary | ICD-10-CM | POA: Insufficient documentation

## 2015-05-02 DIAGNOSIS — F329 Major depressive disorder, single episode, unspecified: Secondary | ICD-10-CM | POA: Insufficient documentation

## 2015-05-02 DIAGNOSIS — F419 Anxiety disorder, unspecified: Secondary | ICD-10-CM | POA: Diagnosis not present

## 2015-05-02 DIAGNOSIS — M545 Low back pain: Secondary | ICD-10-CM | POA: Diagnosis not present

## 2015-05-02 DIAGNOSIS — F1310 Sedative, hypnotic or anxiolytic abuse, uncomplicated: Secondary | ICD-10-CM | POA: Diagnosis not present

## 2015-05-02 DIAGNOSIS — Z8739 Personal history of other diseases of the musculoskeletal system and connective tissue: Secondary | ICD-10-CM | POA: Insufficient documentation

## 2015-05-02 DIAGNOSIS — G40909 Epilepsy, unspecified, not intractable, without status epilepticus: Secondary | ICD-10-CM | POA: Diagnosis not present

## 2015-05-02 DIAGNOSIS — F1120 Opioid dependence, uncomplicated: Secondary | ICD-10-CM | POA: Diagnosis not present

## 2015-05-02 DIAGNOSIS — F322 Major depressive disorder, single episode, severe without psychotic features: Secondary | ICD-10-CM | POA: Diagnosis not present

## 2015-05-02 DIAGNOSIS — M519 Unspecified thoracic, thoracolumbar and lumbosacral intervertebral disc disorder: Secondary | ICD-10-CM | POA: Diagnosis not present

## 2015-05-02 DIAGNOSIS — E039 Hypothyroidism, unspecified: Secondary | ICD-10-CM | POA: Diagnosis not present

## 2015-05-02 DIAGNOSIS — F99 Mental disorder, not otherwise specified: Secondary | ICD-10-CM | POA: Diagnosis not present

## 2015-05-02 DIAGNOSIS — F3181 Bipolar II disorder: Secondary | ICD-10-CM | POA: Diagnosis not present

## 2015-05-02 DIAGNOSIS — Z8619 Personal history of other infectious and parasitic diseases: Secondary | ICD-10-CM | POA: Diagnosis not present

## 2015-05-02 DIAGNOSIS — R05 Cough: Secondary | ICD-10-CM | POA: Diagnosis not present

## 2015-05-02 DIAGNOSIS — R45851 Suicidal ideations: Secondary | ICD-10-CM | POA: Diagnosis not present

## 2015-05-02 DIAGNOSIS — I959 Hypotension, unspecified: Secondary | ICD-10-CM | POA: Diagnosis not present

## 2015-05-02 DIAGNOSIS — J069 Acute upper respiratory infection, unspecified: Secondary | ICD-10-CM | POA: Diagnosis not present

## 2015-05-02 DIAGNOSIS — K219 Gastro-esophageal reflux disease without esophagitis: Secondary | ICD-10-CM | POA: Diagnosis not present

## 2015-05-02 DIAGNOSIS — F1020 Alcohol dependence, uncomplicated: Secondary | ICD-10-CM | POA: Diagnosis not present

## 2015-05-02 DIAGNOSIS — N390 Urinary tract infection, site not specified: Secondary | ICD-10-CM | POA: Diagnosis not present

## 2015-05-02 HISTORY — DX: Anxiety disorder, unspecified: F41.9

## 2015-05-02 LAB — COMPREHENSIVE METABOLIC PANEL
ALT: 21 U/L (ref 14–54)
ANION GAP: 8 (ref 5–15)
AST: 36 U/L (ref 15–41)
Albumin: 4 g/dL (ref 3.5–5.0)
Alkaline Phosphatase: 56 U/L (ref 38–126)
BILIRUBIN TOTAL: 0.3 mg/dL (ref 0.3–1.2)
BUN: 14 mg/dL (ref 6–20)
CHLORIDE: 102 mmol/L (ref 101–111)
CO2: 29 mmol/L (ref 22–32)
CREATININE: 1.03 mg/dL — AB (ref 0.44–1.00)
Calcium: 9.1 mg/dL (ref 8.9–10.3)
GFR calc Af Amer: >60 mL/min (ref >60)
GFR calc non Af Amer: >60 mL/min (ref >60)
Glucose, Bld: 95 mg/dL (ref 65–99)
POTASSIUM: 3.9 mmol/L (ref 3.5–5.1)
SODIUM: 139 mmol/L (ref 135–145)
Total Protein: 7 g/dL (ref 6.5–8.1)

## 2015-05-02 LAB — CBC
HCT: 37.2 % (ref 36.0–46.0)
Hemoglobin: 12.4 g/dL (ref 12.0–15.0)
MCH: 31.2 pg (ref 26.0–34.0)
MCHC: 33.3 g/dL (ref 30.0–36.0)
MCV: 93.5 fL (ref 78.0–100.0)
PLATELETS: 231 10*3/uL (ref 150–400)
RBC: 3.98 MIL/uL (ref 3.87–5.11)
RDW: 12.5 % (ref 11.5–15.5)
WBC: 5.4 10*3/uL (ref 4.0–10.5)

## 2015-05-02 LAB — RAPID URINE DRUG SCREEN, HOSP PERFORMED
AMPHETAMINES: NOT DETECTED
Barbiturates: NOT DETECTED
Benzodiazepines: POSITIVE — AB
COCAINE: NOT DETECTED
Opiates: POSITIVE — AB
Tetrahydrocannabinol: NOT DETECTED

## 2015-05-02 LAB — ACETAMINOPHEN LEVEL: Acetaminophen (Tylenol), Serum: <10 ug/mL — ABNORMAL LOW (ref 10–30)

## 2015-05-02 LAB — SALICYLATE LEVEL: Salicylate Lvl: <4 mg/dL (ref 2.8–30.0)

## 2015-05-02 LAB — ETHANOL

## 2015-05-02 MED ORDER — NICOTINE 21 MG/24HR TD PT24
21.0000 mg | MEDICATED_PATCH | Freq: Every day | TRANSDERMAL | Status: DC
  Filled 2015-05-02: qty 1

## 2015-05-02 MED ORDER — LEVETIRACETAM 500 MG PO TABS: 500.0000 mg | ORAL_TABLET | Freq: Two times a day (BID) | ORAL | Status: DC

## 2015-05-02 MED ORDER — POTASSIUM CHLORIDE ER 10 MEQ PO TBCR: 10.0000 meq | EXTENDED_RELEASE_TABLET | Freq: Every day | ORAL | Status: DC | PRN

## 2015-05-02 MED ORDER — DULOXETINE HCL 60 MG PO CPEP
60.0000 mg | ORAL_CAPSULE | Freq: Two times a day (BID) | ORAL | Status: DC
  Administered 2015-05-02: 60 mg via ORAL
  Filled 2015-05-02 (×2): qty 1

## 2015-05-02 MED ORDER — IBUPROFEN 200 MG PO TABS: 600.0000 mg | ORAL_TABLET | Freq: Three times a day (TID) | ORAL | Status: DC | PRN

## 2015-05-02 MED ORDER — FUROSEMIDE 40 MG PO TABS: 20.0000 mg | ORAL_TABLET | Freq: Every day | ORAL | Status: DC | PRN

## 2015-05-02 MED ORDER — PRAZOSIN HCL 1 MG PO CAPS: 1.0000 mg | ORAL_CAPSULE | Freq: Every day | ORAL | Status: DC

## 2015-05-02 MED ORDER — PANTOPRAZOLE SODIUM 40 MG PO TBEC
40.0000 mg | DELAYED_RELEASE_TABLET | Freq: Every day | ORAL | Status: DC
  Administered 2015-05-02: 40 mg via ORAL
  Filled 2015-05-02: qty 1

## 2015-05-02 MED ORDER — ALUM & MAG HYDROXIDE-SIMETH 200-200-20 MG/5ML PO SUSP: 30.0000 mL | ORAL | Status: DC | PRN

## 2015-05-02 MED ORDER — PREGABALIN 50 MG PO CAPS
50.0000 mg | ORAL_CAPSULE | Freq: Three times a day (TID) | ORAL | Status: DC
  Administered 2015-05-02: 50 mg via ORAL
  Filled 2015-05-02: qty 1

## 2015-05-02 MED ORDER — LEVOTHYROXINE SODIUM 25 MCG PO TABS: 25.0000 ug | ORAL_TABLET | Freq: Every day | ORAL | Status: DC

## 2015-05-02 MED ORDER — HYDROXYZINE HCL 25 MG PO TABS: 50.0000 mg | ORAL_TABLET | Freq: Four times a day (QID) | ORAL | Status: DC | PRN

## 2015-05-02 MED ORDER — LORAZEPAM 1 MG PO TABS
1.0000 mg | ORAL_TABLET | Freq: Three times a day (TID) | ORAL | Status: DC | PRN
  Administered 2015-05-02: 1 mg via ORAL
  Filled 2015-05-02: qty 1

## 2015-05-02 MED ORDER — ZOLPIDEM TARTRATE 5 MG PO TABS: 5.0000 mg | ORAL_TABLET | Freq: Every evening | ORAL | Status: DC | PRN

## 2015-05-02 MED ORDER — ONDANSETRON HCL 4 MG PO TABS: 4.0000 mg | ORAL_TABLET | Freq: Three times a day (TID) | ORAL | Status: DC | PRN

## 2015-05-02 NOTE — BH Assessment (Signed)
Counselor reviewed patient chart and contacted [facility] to review patient information with nurse.  Catalina Antigua states that he is sitting in for the nurse and he has the information from the chart. Catalina Antigua states that the patient is alert and oriented. Counselor requested that the telemedicine machine be placed in the patients room. Catalina Antigua states that he will look for the machine and to begin tele-assessment in about ten minutes.

## 2015-05-02 NOTE — ED Notes (Signed)
Pt. Family took belongings home.

## 2015-05-02 NOTE — ED Notes (Addendum)
Per EMS: husband took [facility] to [facility]  due to cough, congestion, swelling in legs and states pt has been "zoning out" and gets stuck in the middle of tasks and falls asleep while trying to complete them. Husband states he thinks wife is misusing her mothers medications(vicodin, percocet, and phenergan).

## 2015-05-02 NOTE — ED Provider Notes (Signed)
CSN: [id number]     Arrival date & time 05/02/15  1216 History   First MD Initiated Contact with Patient 05/02/15 1225     Chief Complaint  Patient presents with  . zoning out    . Depression     (Consider location/radiation/quality/duration/timing/severity/associated sxs/prior Treatment) HPI Comments: 49 year old female presenting via EMS from her PCPs office with concerns of abnormal behavior. Patient's husband took her to the PCP due to cough, congestion, swelling in both of her legs and episodes of the patient "zoning out" while in the middle of tasks. States she easily falls asleep. The husband thinks his wife is misusing her mother's medications including Vicodin, Percocet and Phenergan. Pt denying this. [facility] Also recently treated for cellulitis of left lower extremity. She completed the course of antibiotics. Her legs have been swelling, more so at the end of the day. Patient admits to nonproductive cough. States she is very upset and "wants to be well" but is "very sad". States she is not sure if she is suicidal. Denies homicidal ideations. No fevers.  The history is provided by the patient.    Past Medical History  Diagnosis Date  . Depression   . DDD (degenerative disc disease)   . DDD (degenerative disc disease)   . DDD (degenerative disc disease)   . Pertussis   . Seizures   . Anxiety    Past Surgical History  Procedure Laterality Date  . Cervical spine surgery    . Cesarean section    . Tubal ligation    . Tonsillectomy     No family history on file. History  Substance Use Topics  . Smoking status: Current Some Day Smoker -- 0.50 packs/day  . Smokeless tobacco: Not on file  . Alcohol Use: No     Comment: quit drinking 7 months   OB History    No data available     Review of Systems  Respiratory: Positive for cough.   Cardiovascular: Positive for leg swelling.  Psychiatric/Behavioral: Positive for sleep  disturbance, dysphoric mood and decreased concentration. The patient is nervous/anxious.   All other systems reviewed and are negative.     Allergies  Shrimp  Home Medications   Prior to Admission medications   Medication Sig Start Date End Date Taking? Authorizing Provider  clindamycin (CLEOCIN) 300 MG capsule Take 1 capsule (300 mg total) by mouth 4 (four) times daily. X 7 days 04/13/15   Debby Freiberg, MD  DULoxetine (CYMBALTA) 60 MG capsule Take 1 capsule (60 mg total) by mouth 2 (two) times daily. 03/28/15   Kerrie Buffalo, NP  furosemide (LASIX) 20 MG tablet Take 1 tablet (20 mg total) by mouth daily as needed for fluid. 03/28/15   Kerrie Buffalo, NP  hydrOXYzine (ATARAX/VISTARIL) 50 MG tablet Take 1 tablet (50 mg total) by mouth every 6 (six) hours as needed for anxiety. 03/28/15   Kerrie Buffalo, NP  levETIRAcetam (KEPPRA) 500 MG tablet Take 1 tablet (500 mg total) by mouth 2 (two) times daily. 03/28/15   Kerrie Buffalo, NP  levothyroxine (SYNTHROID, LEVOTHROID) 25 MCG tablet Take 1 tablet (25 mcg total) by mouth daily before breakfast. 03/28/15   Kerrie Buffalo, NP  omeprazole (PRILOSEC) 20 MG capsule Take 1 capsule (20 mg total) by mouth daily as needed (heartburn). 03/28/15   Kerrie Buffalo, NP  potassium chloride (K-DUR) 10 MEQ tablet Take 1 tablet (10 mEq total) by mouth daily as needed (with furosemide). 03/28/15   Freda Munro  Malachi Carl, NP  prazosin (MINIPRESS) 1 MG capsule Take 1 capsule (1 mg total) by mouth at bedtime. 03/28/15   Kerrie Buffalo, NP  pregabalin (LYRICA) 50 MG capsule Take 1 capsule (50 mg total) by mouth 3 (three) times daily. 03/28/15   Kerrie Buffalo, NP  traZODone (DESYREL) 100 MG tablet Take 1 tablet (100 mg total) by mouth at bedtime as needed for sleep. 03/28/15   Kerrie Buffalo, NP  trimethoprim (TRIMPEX) 100 MG tablet Take 1 tablet (100 mg total) by mouth daily. 03/28/15   Kerrie Buffalo, NP  valACYclovir (VALTREX) 500 MG tablet Take 1 tablet (500 mg total) by mouth daily.  03/28/15   Kerrie Buffalo, NP   BP 102/60 mmHg  Pulse 80  Temp(Src) 98.6 F (37 C) (Oral)  Resp 14  SpO2 98%  LMP 04/29/2015 (Exact Date) Physical Exam  Constitutional: She is oriented to person, place, and time. She appears well-developed and well-nourished. No distress.  HENT:  Head: Normocephalic and atraumatic.  Mouth/Throat: Oropharynx is clear and moist.  Eyes: Conjunctivae and EOM are normal.  Neck: Normal range of motion. Neck supple.  Cardiovascular: Normal rate, regular rhythm and normal heart sounds.   Trace pitting edema LE BL.  Pulmonary/Chest: Effort normal and breath sounds normal. No respiratory distress.  Musculoskeletal: Normal range of motion. She exhibits no edema.  Neurological: She is alert and oriented to person, place, and time. No sensory deficit. GCS eye subscore is 4. GCS verbal subscore is 5. GCS motor subscore is 6.  Skin: Skin is warm and dry.  No erythema or warmth to BL LE.  Psychiatric: Her mood appears anxious. She is withdrawn. She exhibits a depressed mood. She expresses no homicidal ideation.  Strange affect. Tearful.  Nursing note and vitals reviewed.   ED Course  Procedures (including critical care time) Labs Review Labs Reviewed  COMPREHENSIVE METABOLIC PANEL - Abnormal; Notable for the following:    Creatinine, Ser 1.03 (*)    All other components within normal limits  URINE RAPID DRUG SCREEN, HOSP PERFORMED - Abnormal; Notable for the following:    Opiates POSITIVE (*)    Benzodiazepines POSITIVE (*)    All other components within normal limits  ACETAMINOPHEN LEVEL - Abnormal; Notable for the following:    Acetaminophen (Tylenol), Serum <10 (*)    All other components within normal limits  CBC  ETHANOL  SALICYLATE LEVEL    Imaging Review Dg Chest 2 View  05/02/2015   CLINICAL DATA:  Cough and chest congestion.  EXAM: CHEST  2 VIEW  COMPARISON:  11/09/2014  FINDINGS: The heart size and mediastinal contours are within normal  limits. Both lungs are clear. The visualized skeletal structures are unremarkable.  IMPRESSION: Normal chest.   Electronically Signed   By: Lorriane Shire M.D.   On: 05/02/2015 13:38     EKG Interpretation None      MDM   Final diagnoses:  Depression   Nontoxic appearing, NAD. AF VSS. Chest x-ray obtained given presenting to PCP for cough and patient being poor historian and is smoker. Chest x-ray negative. Workup significant for very mild elevation in serum creatinine, UDS positive for opiates and benzos. Workup otherwise negative. Afebrile. Lower extremities with trace pitting edema, no erythema or warmth concerning for continued cellulitis. No leukocytosis. Patient evaluated by TTS and meets inpatient criteria for psych. Accepted to [facility], Dr. Dareen Piano. Stable for transfer.  Carman Ching, PA-C 05/02/15 1513  Serita Grit, MD 05/04/15 1028

## 2015-05-02 NOTE — ED Notes (Signed)
[name] has arrived to transport pt to [facility]. Pt is calm and cooperative, no distress noted.
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
## 2015-05-02 NOTE — ED Notes (Signed)
Pt c/o depression and states at times she feels like hurting herself, denies HI. Pt cannot seem to remember why she was at the doctor, pt seems to be having a difficult time getting what she has to say out. Pt appears very drowsy. Pt states 2 weeks ago she came out of behavioral health

## 2015-05-02 NOTE — ED Notes (Signed)
Bed: JK93 Expected date:  Expected time:  Means of arrival:  Comments: Ems- from doctors office for altered mental status x several days

## 2015-05-02 NOTE — BH Assessment (Addendum)
Per Caryl Pina, patient accepted to [facility] by Dr. Dareen Piano. The nursing report is [phone]. Patients nurse updated about patient's disposition and will contact [facility] for transport.

## 2015-05-21 DIAGNOSIS — F39 Unspecified mood [affective] disorder: Secondary | ICD-10-CM | POA: Diagnosis not present

## 2015-05-21 DIAGNOSIS — F902 Attention-deficit hyperactivity disorder, combined type: Secondary | ICD-10-CM | POA: Diagnosis not present

## 2015-05-23 DIAGNOSIS — F902 Attention-deficit hyperactivity disorder, combined type: Secondary | ICD-10-CM | POA: Diagnosis not present

## 2015-05-23 DIAGNOSIS — F39 Unspecified mood [affective] disorder: Secondary | ICD-10-CM | POA: Diagnosis not present

## 2015-05-26 ENCOUNTER — Emergency Department (HOSPITAL_COMMUNITY)
Admission: EM | Admit: 2015-05-26 | Discharge: 2015-05-26 | Disposition: A | Discharge status: Home / Self Care | Payer: Commercial Managed Care - HMO | Attending: Emergency Medicine | Admitting: Emergency Medicine

## 2015-05-26 ENCOUNTER — Encounter (HOSPITAL_COMMUNITY): Payer: Self-pay | Admitting: Emergency Medicine

## 2015-05-26 DIAGNOSIS — R21 Rash and other nonspecific skin eruption: Secondary | ICD-10-CM | POA: Diagnosis not present

## 2015-05-26 DIAGNOSIS — Z720 Tobacco use: Secondary | ICD-10-CM | POA: Insufficient documentation

## 2015-05-26 DIAGNOSIS — X58XXXA Exposure to other specified factors, initial encounter: Secondary | ICD-10-CM | POA: Insufficient documentation

## 2015-05-26 DIAGNOSIS — Y9389 Activity, other specified: Secondary | ICD-10-CM | POA: Diagnosis not present

## 2015-05-26 DIAGNOSIS — F1510 Other stimulant abuse, uncomplicated: Secondary | ICD-10-CM | POA: Insufficient documentation

## 2015-05-26 DIAGNOSIS — Y998 Other external cause status: Secondary | ICD-10-CM | POA: Insufficient documentation

## 2015-05-26 DIAGNOSIS — Y9289 Other specified places as the place of occurrence of the external cause: Secondary | ICD-10-CM | POA: Diagnosis not present

## 2015-05-26 DIAGNOSIS — F1310 Sedative, hypnotic or anxiolytic abuse, uncomplicated: Secondary | ICD-10-CM | POA: Diagnosis not present

## 2015-05-26 DIAGNOSIS — T7840XA Allergy, unspecified, initial encounter: Secondary | ICD-10-CM | POA: Diagnosis present

## 2015-05-26 DIAGNOSIS — L298 Other pruritus: Secondary | ICD-10-CM | POA: Diagnosis not present

## 2015-05-26 DIAGNOSIS — L299 Pruritus, unspecified: Secondary | ICD-10-CM | POA: Insufficient documentation

## 2015-05-26 DIAGNOSIS — F329 Major depressive disorder, single episode, unspecified: Secondary | ICD-10-CM | POA: Diagnosis not present

## 2015-05-26 DIAGNOSIS — L282 Other prurigo: Secondary | ICD-10-CM

## 2015-05-26 DIAGNOSIS — Z3202 Encounter for pregnancy test, result negative: Secondary | ICD-10-CM | POA: Insufficient documentation

## 2015-05-26 DIAGNOSIS — F419 Anxiety disorder, unspecified: Secondary | ICD-10-CM | POA: Insufficient documentation

## 2015-05-26 DIAGNOSIS — Z79899 Other long term (current) drug therapy: Secondary | ICD-10-CM | POA: Insufficient documentation

## 2015-05-26 DIAGNOSIS — G40909 Epilepsy, unspecified, not intractable, without status epilepticus: Secondary | ICD-10-CM | POA: Diagnosis not present

## 2015-05-26 DIAGNOSIS — F1110 Opioid abuse, uncomplicated: Secondary | ICD-10-CM | POA: Insufficient documentation

## 2015-05-26 DIAGNOSIS — Z8739 Personal history of other diseases of the musculoskeletal system and connective tissue: Secondary | ICD-10-CM | POA: Insufficient documentation

## 2015-05-26 LAB — RAPID URINE DRUG SCREEN, HOSP PERFORMED
Amphetamines: POSITIVE — AB
BARBITURATES: NOT DETECTED
Benzodiazepines: POSITIVE — AB
Cocaine: NOT DETECTED
OPIATES: POSITIVE — AB
TETRAHYDROCANNABINOL: NOT DETECTED

## 2015-05-26 LAB — URINALYSIS, ROUTINE W REFLEX MICROSCOPIC
BILIRUBIN URINE: NEGATIVE
Glucose, UA: NEGATIVE mg/dL
Hgb urine dipstick: NEGATIVE
KETONES UR: NEGATIVE mg/dL
Leukocytes, UA: NEGATIVE
Nitrite: NEGATIVE
Protein, ur: NEGATIVE mg/dL
SPECIFIC GRAVITY, URINE: 1.012 (ref 1.005–1.030)
Urobilinogen, UA: 0.2 mg/dL (ref 0.0–1.0)
pH: 6 (ref 5.0–8.0)

## 2015-05-26 LAB — CBC WITH DIFFERENTIAL/PLATELET
BASOS ABS: 0 10*3/uL (ref 0.0–0.1)
BASOS PCT: 0 % (ref 0–1)
EOS ABS: 0.9 10*3/uL — AB (ref 0.0–0.7)
Eosinophils Relative: 12 % — ABNORMAL HIGH (ref 0–5)
HCT: 37.6 % (ref 36.0–46.0)
Hemoglobin: 13.2 g/dL (ref 12.0–15.0)
Lymphocytes Relative: 41 % (ref 12–46)
Lymphs Abs: 3.2 10*3/uL (ref 0.7–4.0)
MCH: 31.8 pg (ref 26.0–34.0)
MCHC: 35.1 g/dL (ref 30.0–36.0)
MCV: 90.6 fL (ref 78.0–100.0)
MONO ABS: 0.6 10*3/uL (ref 0.1–1.0)
Monocytes Relative: 8 % (ref 3–12)
NEUTROS ABS: 3 10*3/uL (ref 1.7–7.7)
Neutrophils Relative %: 39 % — ABNORMAL LOW (ref 43–77)
Platelets: 231 10*3/uL (ref 150–400)
RBC: 4.15 MIL/uL (ref 3.87–5.11)
RDW: 12.3 % (ref 11.5–15.5)
WBC: 7.8 10*3/uL (ref 4.0–10.5)

## 2015-05-26 LAB — BASIC METABOLIC PANEL
Anion gap: 10 (ref 5–15)
BUN: 11 mg/dL (ref 6–20)
CALCIUM: 9.8 mg/dL (ref 8.9–10.3)
CO2: 27 mmol/L (ref 22–32)
Chloride: 100 mmol/L — ABNORMAL LOW (ref 101–111)
Creatinine, Ser: 1.15 mg/dL — ABNORMAL HIGH (ref 0.44–1.00)
GFR, EST NON AFRICAN AMERICAN: 55 mL/min — AB (ref >60)
Glucose, Bld: 108 mg/dL — ABNORMAL HIGH (ref 65–99)
Potassium: 3.7 mmol/L (ref 3.5–5.1)
SODIUM: 137 mmol/L (ref 135–145)

## 2015-05-26 LAB — POC URINE PREG, ED: Preg Test, Ur: NEGATIVE

## 2015-05-26 MED ORDER — PREDNISONE 20 MG PO TABS: 60.0000 mg | ORAL_TABLET | Freq: Every day | ORAL | Status: DC

## 2015-05-26 MED ORDER — DIPHENHYDRAMINE HCL 25 MG PO CAPS
50.0000 mg | ORAL_CAPSULE | Freq: Once | ORAL | Status: AC
  Administered 2015-05-26: 50 mg via ORAL
  Filled 2015-05-26: qty 2

## 2015-05-26 NOTE — ED Notes (Signed)
Pt reports that she started itching yesterday, took a benadryl and had minor relief.  Not sure what the reaction is to.

## 2015-05-26 NOTE — ED Notes (Signed)
Pt reports that she just started taking Lamitical prescribed by Dr. Donneta Romberg.

## 2015-05-26 NOTE — ED Provider Notes (Signed)
CSN: [id number]     Arrival date & time 05/26/15  0459 History   First MD Initiated Contact with Patient 05/26/15 0502     Chief Complaint  Patient presents with  . Allergic Reaction     (Consider location/radiation/quality/duration/timing/severity/associated sxs/prior Treatment) Patient is a 49 y.o. female presenting with allergic reaction.  Allergic Reaction Presenting symptoms: itching and rash   Presenting symptoms: no difficulty breathing, no difficulty swallowing and no wheezing   Severity:  Moderate Prior allergic episodes:  No prior episodes Context: no cosmetics, no dairy/milk products, no food allergies, no insect bite/sting, no new detergents/soaps, no nuts and no poison ivy   Context comment:  While cleaning mother's house Relieved by:  Nothing Worsened by:  Nothing tried Ineffective treatments:  None tried   Past Medical History  Diagnosis Date  . Depression   . DDD (degenerative disc disease)   . DDD (degenerative disc disease)   . DDD (degenerative disc disease)   . Pertussis   . Seizures   . Anxiety    Past Surgical History  Procedure Laterality Date  . Cervical spine surgery    . Cesarean section    . Tubal ligation    . Tonsillectomy     No family history on file. History  Substance Use Topics  . Smoking status: Current Some Day Smoker -- 0.50 packs/day  . Smokeless tobacco: Not on file  . Alcohol Use: No     Comment: quit drinking 7 months   OB History    No data available     Review of Systems  HENT: Negative for trouble swallowing.   Respiratory: Negative for wheezing.   Skin: Positive for itching and rash.  All other systems reviewed and are negative.     Allergies  Shrimp  Home Medications   Prior to Admission medications   Medication Sig Start Date End Date Taking? Authorizing Provider  clindamycin (CLEOCIN) 300 MG capsule Take 1 capsule (300 mg total) by mouth 4 (four) times daily. X 7 days 04/13/15   Debby Freiberg, MD   DULoxetine (CYMBALTA) 60 MG capsule Take 1 capsule (60 mg total) by mouth 2 (two) times daily. 03/28/15   Kerrie Buffalo, NP  furosemide (LASIX) 20 MG tablet Take 1 tablet (20 mg total) by mouth daily as needed for fluid. 03/28/15   Kerrie Buffalo, NP  hydrOXYzine (ATARAX/VISTARIL) 50 MG tablet Take 1 tablet (50 mg total) by mouth every 6 (six) hours as needed for anxiety. 03/28/15   Kerrie Buffalo, NP  levETIRAcetam (KEPPRA) 500 MG tablet Take 1 tablet (500 mg total) by mouth 2 (two) times daily. 03/28/15   Kerrie Buffalo, NP  levothyroxine (SYNTHROID, LEVOTHROID) 25 MCG tablet Take 1 tablet (25 mcg total) by mouth daily before breakfast. 03/28/15   Kerrie Buffalo, NP  omeprazole (PRILOSEC) 20 MG capsule Take 1 capsule (20 mg total) by mouth daily as needed (heartburn). 03/28/15   Kerrie Buffalo, NP  potassium chloride (K-DUR) 10 MEQ tablet Take 1 tablet (10 mEq total) by mouth daily as needed (with furosemide). 03/28/15   Kerrie Buffalo, NP  prazosin (MINIPRESS) 1 MG capsule Take 1 capsule (1 mg total) by mouth at bedtime. 03/28/15   Kerrie Buffalo, NP  predniSONE (DELTASONE) 20 MG tablet Take 3 tablets (60 mg total) by mouth daily. 05/26/15   Debby Freiberg, MD  pregabalin (LYRICA) 50 MG capsule Take 1 capsule (50 mg total) by mouth 3 (three) times daily. 03/28/15   Kerrie Buffalo, NP  traZODone ([place])  100 MG tablet Take 1 tablet (100 mg total) by mouth at bedtime as needed for sleep. 03/28/15   Kerrie Buffalo, NP  trimethoprim (TRIMPEX) 100 MG tablet Take 1 tablet (100 mg total) by mouth daily. 03/28/15   Kerrie Buffalo, NP  valACYclovir (VALTREX) 500 MG tablet Take 1 tablet (500 mg total) by mouth daily. 03/28/15   Kerrie Buffalo, NP   BP 131/74 mmHg  Pulse 112  Temp(Src) 98.7 F (37.1 C) (Oral)  Resp 18  Ht 5\' 1"  (1.549 m)  Wt 134 lb (60.782 kg)  BMI 25.33 kg/m2  SpO2 100%  LMP 04/29/2015 (Exact Date) Physical Exam  Constitutional: She is oriented to person, place, and time. She appears well-developed and  well-nourished.  HENT:  Head: Normocephalic and atraumatic.  Right Ear: External ear normal.  Left Ear: External ear normal.  Eyes: Conjunctivae and EOM are normal. Pupils are equal, round, and reactive to light.  Neck: Normal range of motion. Neck supple.  Cardiovascular: Normal rate, regular rhythm, normal heart sounds and intact distal pulses.   Pulmonary/Chest: Effort normal and breath sounds normal.  Abdominal: Soft. Bowel sounds are normal. There is no tenderness.  Musculoskeletal: Normal range of motion.  Neurological: She is alert and oriented to person, place, and time.  Skin: Skin is warm and dry.  Diffuse excoriations  Vitals reviewed.   ED Course  Procedures (including critical care time) Labs Review Labs Reviewed  CBC WITH DIFFERENTIAL/PLATELET - Abnormal; Notable for the following:    Neutrophils Relative % 39 (*)    Eosinophils Relative 12 (*)    Eosinophils Absolute 0.9 (*)    All other components within normal limits  BASIC METABOLIC PANEL - Abnormal; Notable for the following:    Chloride 100 (*)    Glucose, Bld 108 (*)    Creatinine, Ser 1.15 (*)    GFR calc non Af Amer 55 (*)    All other components within normal limits  URINALYSIS, ROUTINE W REFLEX MICROSCOPIC (NOT AT [facility])  URINE RAPID DRUG SCREEN, HOSP PERFORMED  POC URINE PREG, ED    Imaging Review No results found.   EKG Interpretation None      MDM   Final diagnoses:  Pruritic rash    49 y.o. female with pertinent PMH of depression, abnormal behavior presents with generalized pruritis as above.  Patient denies new exposure to medication, insect bite, detergents, however was cleaning her mother's house is unusual. She does have a history of seasonal allergies. No respiratory distress, other signs of anaphylaxis. On arrival today the patient has vital signs and physical exam as above. Generalized excoriations, no frank urticarial wheals. She did on further history endorse vomiting one  episode. Suspect likely cold agglutinin mediated process. Patient was given strict return precautions for anaphylaxis, voiced understanding and agreed to follow-up. Discharged home with symptomatic therapy as well as a short prednisone course.    I have reviewed all laboratory and imaging studies if ordered as above  1. Pruritic rash         Debby Freiberg, MD 05/26/15 [phone]

## 2015-05-26 NOTE — Discharge Instructions (Signed)
Pruritus  °Pruritus is an itch. There are many different problems that can cause an itch. Dry skin is one of the most common causes of itching. Most cases of itching do not require medical attention.  °HOME CARE INSTRUCTIONS  °Make sure your skin is moistened on a regular basis. A moisturizer that contains petroleum jelly is best for keeping moisture in your skin. If you develop a rash, you may try the following for relief:  °· Use corticosteroid cream. °· Apply cool compresses to the affected areas. °· Bathe with Epsom salts or baking soda in the bathwater. °· Soak in colloidal oatmeal baths. These are available at your pharmacy. °· Apply baking soda paste to the rash. Stir water into baking soda until it reaches a paste-like consistency. °· Use an anti-itch lotion. °· Take over-the-counter diphenhydramine medicine by mouth as the instructions direct. °· Avoid scratching. Scratching may cause the rash to become infected. If itching is very bad, your caregiver may suggest prescription lotions or creams to lessen your symptoms. °· Avoid hot showers, which can make itching worse. A cold shower may help with itching as long as you use a moisturizer after the shower. °SEEK MEDICAL CARE IF: °The itching does not go away after several days. °Document Released: 06/24/2011 Document Revised: 02/26/2014 Document Reviewed: 06/24/2011 °Exit[employer]® Patient Information ©2015 [employer]. This information is not intended to replace advice given to you by your health care provider. Make sure you discuss any questions you have with your health care provider. ° °

## 2015-05-26 NOTE — ED Notes (Signed)
Pt became slightly anxious and tearful was able to be verbally redirected.

## 2015-06-04 DIAGNOSIS — R05 Cough: Secondary | ICD-10-CM | POA: Diagnosis not present

## 2015-06-04 DIAGNOSIS — N898 Other specified noninflammatory disorders of vagina: Secondary | ICD-10-CM | POA: Diagnosis not present

## 2015-06-04 DIAGNOSIS — F411 Generalized anxiety disorder: Secondary | ICD-10-CM | POA: Diagnosis not present

## 2015-06-04 DIAGNOSIS — J069 Acute upper respiratory infection, unspecified: Secondary | ICD-10-CM | POA: Diagnosis not present

## 2015-06-04 DIAGNOSIS — B009 Herpesviral infection, unspecified: Secondary | ICD-10-CM | POA: Diagnosis not present

## 2015-06-06 DIAGNOSIS — F902 Attention-deficit hyperactivity disorder, combined type: Secondary | ICD-10-CM | POA: Diagnosis not present

## 2015-06-06 DIAGNOSIS — F39 Unspecified mood [affective] disorder: Secondary | ICD-10-CM | POA: Diagnosis not present

## 2015-06-26 DIAGNOSIS — X58XXXA Exposure to other specified factors, initial encounter: Secondary | ICD-10-CM | POA: Diagnosis not present

## 2015-06-26 DIAGNOSIS — S161XXA Strain of muscle, fascia and tendon at neck level, initial encounter: Secondary | ICD-10-CM | POA: Diagnosis not present

## 2015-06-26 DIAGNOSIS — Y9389 Activity, other specified: Secondary | ICD-10-CM | POA: Diagnosis not present

## 2015-06-26 DIAGNOSIS — S199XXA Unspecified injury of neck, initial encounter: Secondary | ICD-10-CM | POA: Diagnosis not present

## 2015-07-04 DIAGNOSIS — R05 Cough: Secondary | ICD-10-CM | POA: Diagnosis not present

## 2015-07-04 DIAGNOSIS — R0602 Shortness of breath: Secondary | ICD-10-CM | POA: Diagnosis not present

## 2015-07-04 DIAGNOSIS — J690 Pneumonitis due to inhalation of food and vomit: Secondary | ICD-10-CM | POA: Diagnosis not present

## 2015-07-11 DIAGNOSIS — G933 Postviral fatigue syndrome: Secondary | ICD-10-CM | POA: Diagnosis not present

## 2015-07-11 DIAGNOSIS — Z79891 Long term (current) use of opiate analgesic: Secondary | ICD-10-CM | POA: Diagnosis not present

## 2015-07-11 DIAGNOSIS — E559 Vitamin D deficiency, unspecified: Secondary | ICD-10-CM | POA: Diagnosis not present

## 2015-07-11 DIAGNOSIS — Z0000 Encounter for general adult medical examination without abnormal findings: Secondary | ICD-10-CM | POA: Diagnosis not present

## 2015-07-11 DIAGNOSIS — E780 Pure hypercholesterolemia: Secondary | ICD-10-CM | POA: Diagnosis not present

## 2015-07-11 DIAGNOSIS — R0602 Shortness of breath: Secondary | ICD-10-CM | POA: Diagnosis not present

## 2015-07-11 DIAGNOSIS — Z1389 Encounter for screening for other disorder: Secondary | ICD-10-CM | POA: Diagnosis not present

## 2015-07-11 DIAGNOSIS — F901 Attention-deficit hyperactivity disorder, predominantly hyperactive type: Secondary | ICD-10-CM | POA: Diagnosis not present

## 2015-07-11 DIAGNOSIS — Z23 Encounter for immunization: Secondary | ICD-10-CM | POA: Diagnosis not present

## 2015-07-18 DIAGNOSIS — M5412 Radiculopathy, cervical region: Secondary | ICD-10-CM | POA: Diagnosis not present

## 2015-07-18 DIAGNOSIS — M47812 Spondylosis without myelopathy or radiculopathy, cervical region: Secondary | ICD-10-CM | POA: Diagnosis not present

## 2015-07-25 DIAGNOSIS — Z79891 Long term (current) use of opiate analgesic: Secondary | ICD-10-CM | POA: Diagnosis not present

## 2015-07-25 DIAGNOSIS — E780 Pure hypercholesterolemia: Secondary | ICD-10-CM | POA: Diagnosis not present

## 2015-07-25 DIAGNOSIS — F901 Attention-deficit hyperactivity disorder, predominantly hyperactive type: Secondary | ICD-10-CM | POA: Diagnosis not present

## 2015-07-25 DIAGNOSIS — E039 Hypothyroidism, unspecified: Secondary | ICD-10-CM | POA: Diagnosis not present

## 2015-07-25 DIAGNOSIS — E559 Vitamin D deficiency, unspecified: Secondary | ICD-10-CM | POA: Diagnosis not present

## 2015-08-08 DIAGNOSIS — M545 Low back pain: Secondary | ICD-10-CM | POA: Diagnosis not present

## 2015-08-12 DIAGNOSIS — M47812 Spondylosis without myelopathy or radiculopathy, cervical region: Secondary | ICD-10-CM | POA: Diagnosis not present

## 2015-08-12 DIAGNOSIS — M5416 Radiculopathy, lumbar region: Secondary | ICD-10-CM | POA: Diagnosis not present

## 2015-08-12 DIAGNOSIS — M47816 Spondylosis without myelopathy or radiculopathy, lumbar region: Secondary | ICD-10-CM | POA: Diagnosis not present

## 2015-08-12 DIAGNOSIS — M5412 Radiculopathy, cervical region: Secondary | ICD-10-CM | POA: Diagnosis not present

## 2015-08-12 DIAGNOSIS — Z6825 Body mass index (BMI) 25.0-25.9, adult: Secondary | ICD-10-CM | POA: Diagnosis not present

## 2015-08-23 DIAGNOSIS — Z79891 Long term (current) use of opiate analgesic: Secondary | ICD-10-CM | POA: Diagnosis not present

## 2015-09-17 ENCOUNTER — Emergency Department (HOSPITAL_COMMUNITY): Payer: Commercial Managed Care - HMO

## 2015-09-17 ENCOUNTER — Encounter (HOSPITAL_COMMUNITY): Payer: Self-pay | Admitting: Emergency Medicine

## 2015-09-17 ENCOUNTER — Emergency Department (HOSPITAL_COMMUNITY)
Admission: EM | Admit: 2015-09-17 | Discharge: 2015-09-17 | Disposition: A | Discharge status: Home / Self Care | Payer: Commercial Managed Care - HMO | Attending: Emergency Medicine | Admitting: Emergency Medicine

## 2015-09-17 DIAGNOSIS — G8929 Other chronic pain: Secondary | ICD-10-CM | POA: Insufficient documentation

## 2015-09-17 DIAGNOSIS — R079 Chest pain, unspecified: Secondary | ICD-10-CM | POA: Insufficient documentation

## 2015-09-17 DIAGNOSIS — Z79899 Other long term (current) drug therapy: Secondary | ICD-10-CM | POA: Insufficient documentation

## 2015-09-17 DIAGNOSIS — F419 Anxiety disorder, unspecified: Secondary | ICD-10-CM | POA: Diagnosis not present

## 2015-09-17 DIAGNOSIS — R42 Dizziness and giddiness: Secondary | ICD-10-CM | POA: Insufficient documentation

## 2015-09-17 DIAGNOSIS — R202 Paresthesia of skin: Secondary | ICD-10-CM | POA: Diagnosis not present

## 2015-09-17 DIAGNOSIS — Z7952 Long term (current) use of systemic steroids: Secondary | ICD-10-CM | POA: Insufficient documentation

## 2015-09-17 DIAGNOSIS — Z792 Long term (current) use of antibiotics: Secondary | ICD-10-CM | POA: Insufficient documentation

## 2015-09-17 DIAGNOSIS — R200 Anesthesia of skin: Secondary | ICD-10-CM | POA: Diagnosis not present

## 2015-09-17 DIAGNOSIS — F17200 Nicotine dependence, unspecified, uncomplicated: Secondary | ICD-10-CM | POA: Diagnosis not present

## 2015-09-17 DIAGNOSIS — R479 Unspecified speech disturbances: Secondary | ICD-10-CM | POA: Diagnosis not present

## 2015-09-17 DIAGNOSIS — R0789 Other chest pain: Secondary | ICD-10-CM | POA: Diagnosis not present

## 2015-09-17 DIAGNOSIS — F329 Major depressive disorder, single episode, unspecified: Secondary | ICD-10-CM | POA: Diagnosis not present

## 2015-09-17 DIAGNOSIS — Z8619 Personal history of other infectious and parasitic diseases: Secondary | ICD-10-CM | POA: Insufficient documentation

## 2015-09-17 DIAGNOSIS — G40909 Epilepsy, unspecified, not intractable, without status epilepticus: Secondary | ICD-10-CM | POA: Diagnosis not present

## 2015-09-17 DIAGNOSIS — M542 Cervicalgia: Secondary | ICD-10-CM | POA: Diagnosis not present

## 2015-09-17 LAB — CBC
HCT: 40.6 % (ref 36.0–46.0)
Hemoglobin: 14 g/dL (ref 12.0–15.0)
MCH: 33.5 pg (ref 26.0–34.0)
MCHC: 34.5 g/dL (ref 30.0–36.0)
MCV: 97.1 fL (ref 78.0–100.0)
PLATELETS: 329 10*3/uL (ref 150–400)
RBC: 4.18 MIL/uL (ref 3.87–5.11)
RDW: 13.5 % (ref 11.5–15.5)
WBC: 9 10*3/uL (ref 4.0–10.5)

## 2015-09-17 LAB — BASIC METABOLIC PANEL
Anion gap: 8 (ref 5–15)
BUN: 13 mg/dL (ref 6–20)
CHLORIDE: 103 mmol/L (ref 101–111)
CO2: 26 mmol/L (ref 22–32)
CREATININE: 0.99 mg/dL (ref 0.44–1.00)
Calcium: 9.8 mg/dL (ref 8.9–10.3)
Glucose, Bld: 109 mg/dL — ABNORMAL HIGH (ref 65–99)
POTASSIUM: 4.2 mmol/L (ref 3.5–5.1)
SODIUM: 137 mmol/L (ref 135–145)

## 2015-09-17 LAB — I-STAT BETA HCG BLOOD, ED (MC, WL, AP ONLY)

## 2015-09-17 LAB — HEPATIC FUNCTION PANEL
ALT: 19 U/L (ref 14–54)
AST: 29 U/L (ref 15–41)
Albumin: 4.2 g/dL (ref 3.5–5.0)
Alkaline Phosphatase: 53 U/L (ref 38–126)
BILIRUBIN INDIRECT: 0.4 mg/dL (ref 0.3–0.9)
Bilirubin, Direct: 0.2 mg/dL (ref 0.1–0.5)
TOTAL PROTEIN: 7 g/dL (ref 6.5–8.1)
Total Bilirubin: 0.6 mg/dL (ref 0.3–1.2)

## 2015-09-17 LAB — I-STAT TROPONIN, ED
TROPONIN I, POC: 0 ng/mL (ref 0.00–0.08)
Troponin i, poc: 0 ng/mL (ref 0.00–0.08)

## 2015-09-17 LAB — LIPASE, BLOOD: LIPASE: 38 U/L (ref 11–51)

## 2015-09-17 MED ORDER — ONDANSETRON 4 MG PO TBDP
4.0000 mg | ORAL_TABLET | Freq: Once | ORAL | Status: AC
  Administered 2015-09-17: 4 mg via ORAL
  Filled 2015-09-17: qty 1

## 2015-09-17 MED ORDER — IBUPROFEN 600 MG PO TABS: 600.0000 mg | ORAL_TABLET | Freq: Four times a day (QID) | ORAL | Status: DC | PRN

## 2015-09-17 MED ORDER — IBUPROFEN 200 MG PO TABS
600.0000 mg | ORAL_TABLET | Freq: Once | ORAL | Status: AC
  Administered 2015-09-17: 600 mg via ORAL
  Filled 2015-09-17: qty 3

## 2015-09-17 MED ORDER — DIAZEPAM 5 MG PO TABS: 5.0000 mg | ORAL_TABLET | Freq: Four times a day (QID) | ORAL | Status: DC | PRN

## 2015-09-17 MED ORDER — DIAZEPAM 5 MG PO TABS
10.0000 mg | ORAL_TABLET | Freq: Once | ORAL | Status: AC
  Administered 2015-09-17: 10 mg via ORAL
  Filled 2015-09-17: qty 2

## 2015-09-17 NOTE — ED Notes (Signed)
Pt reports since 10am she has had intermitent difficulty getting her words out. Then after she was at the gym she became nauseated and had tingling in L leg. Pt now reports sharp pain/heaviness in chest and pain and tingling in L arm. Stroke scale negative. sts speech in normal at the moment.

## 2015-09-17 NOTE — ED Provider Notes (Signed)
CSN: [id number]     Arrival date & time 09/17/15  1531 History   First MD Initiated Contact with Patient 09/17/15 1615     Chief Complaint  Patient presents with  . Chest Pain  . Tingling     (Consider location/radiation/quality/duration/timing/severity/associated sxs/prior Treatment) HPI Comments: 49 y.o. Female with history of DDD, depression, seizures, anxiety presents for chest pain and tingling down her left arm.  The patient reports that she and her husband had just been leaving the gym after working out when the symptoms began.  She said that the tingling in her arm feels like pain and she denies weakness or loss of sensation.  She reports that after this she developed pain and heaviness in her chest which she denies ever having before.  She says that she also felt like earlier she was having word finding difficulties but denies slurred speech or saying inappropriate words.  Husband reports that he did not notice any change in her speech.  No headache.  Reports chronic neck pain.  Patient also reports doing an intense abdominal work out machine before the onset of symptoms.   Past Medical History  Diagnosis Date  . Depression   . DDD (degenerative disc disease)   . DDD (degenerative disc disease)   . DDD (degenerative disc disease)   . Pertussis   . Seizures ([place])   . Anxiety    Past Surgical History  Procedure Laterality Date  . Cervical spine surgery    . Cesarean section    . Tubal ligation    . Tonsillectomy     No family history on file. Social History  Substance Use Topics  . Smoking status: Current Some Day Smoker -- 0.50 packs/day  . Smokeless tobacco: None  . Alcohol Use: No     Comment: quit drinking 7 months   OB History    No data available     Review of Systems  Constitutional: Negative for fever, chills, diaphoresis and fatigue.  HENT: Negative for congestion, hearing loss, postnasal drip, rhinorrhea and sinus pressure.   Respiratory: Negative for  cough, chest tightness and shortness of breath.   Cardiovascular: Positive for chest pain. Negative for palpitations.  Gastrointestinal: Negative for nausea, vomiting, abdominal pain and diarrhea.  Genitourinary: Negative for dysuria, urgency and flank pain.  Musculoskeletal: Positive for neck pain. Negative for myalgias and back pain.  Skin: Negative for rash.  Neurological: Positive for speech difficulty (word fingind difficulty, resolved) and light-headedness. Negative for facial asymmetry, weakness, numbness and headaches.  Hematological: Does not bruise/bleed easily.      Allergies  Naproxen  Home Medications   Prior to Admission medications   Medication Sig Start Date End Date Taking? Authorizing Provider  acetaminophen (TYLENOL) 500 MG tablet Take 1,000 mg by mouth every 6 (six) hours as needed for moderate pain.   Yes Historical Provider, MD  Aspirin-Salicylamide-Caffeine (BC HEADACHE POWDER PO) Take 1 Package by mouth daily as needed (for pain).   Yes Historical Provider, MD  cyclobenzaprine (FLEXERIL) 5 MG tablet Take 5 mg by mouth daily as needed for muscle spasms.  08/12/15  Yes Historical Provider, MD  DULoxetine (CYMBALTA) 30 MG capsule Take 30 mg by mouth 2 (two) times daily. 08/01/15  Yes Historical Provider, MD  lamoTRIgine (LAMICTAL) 100 MG tablet Take 100 mg by mouth every morning. 08/02/15  Yes Historical Provider, MD  levothyroxine (SYNTHROID, LEVOTHROID) 25 MCG tablet Take 1 tablet (25 mcg total) by mouth daily before breakfast. 03/28/15  Yes  Kerrie Buffalo, NP  omeprazole (PRILOSEC) 20 MG capsule Take 1 capsule (20 mg total) by mouth daily as needed (heartburn). 03/28/15  Yes Kerrie Buffalo, NP  pregabalin (LYRICA) 50 MG capsule Take 1 capsule (50 mg total) by mouth 3 (three) times daily. Patient taking differently: Take 50 mg by mouth daily as needed (for pain).  03/28/15  Yes Kerrie Buffalo, NP  trimethoprim (TRIMPEX) 100 MG tablet Take 1 tablet (100 mg total) by mouth  daily. 03/28/15  Yes Kerrie Buffalo, NP  valACYclovir (VALTREX) 500 MG tablet Take 1 tablet (500 mg total) by mouth daily. 03/28/15  Yes Kerrie Buffalo, NP  clindamycin (CLEOCIN) 300 MG capsule Take 1 capsule (300 mg total) by mouth 4 (four) times daily. X 7 days 04/13/15   Debby Freiberg, MD  DULoxetine (CYMBALTA) 60 MG capsule Take 1 capsule (60 mg total) by mouth 2 (two) times daily. Patient taking differently: Take 30 mg by mouth 2 (two) times daily.  03/28/15   Kerrie Buffalo, NP  furosemide (LASIX) 20 MG tablet Take 1 tablet (20 mg total) by mouth daily as needed for fluid. 03/28/15   Kerrie Buffalo, NP  hydrOXYzine (ATARAX/VISTARIL) 50 MG tablet Take 1 tablet (50 mg total) by mouth every 6 (six) hours as needed for anxiety. 03/28/15   Kerrie Buffalo, NP  levETIRAcetam (KEPPRA) 500 MG tablet Take 1 tablet (500 mg total) by mouth 2 (two) times daily. 03/28/15   Kerrie Buffalo, NP  potassium chloride (K-DUR) 10 MEQ tablet Take 1 tablet (10 mEq total) by mouth daily as needed (with furosemide). 03/28/15   Kerrie Buffalo, NP  prazosin (MINIPRESS) 1 MG capsule Take 1 capsule (1 mg total) by mouth at bedtime. 03/28/15   Kerrie Buffalo, NP  predniSONE (DELTASONE) 20 MG tablet Take 3 tablets (60 mg total) by mouth daily. 05/26/15   Debby Freiberg, MD  traZODone (DESYREL) 100 MG tablet Take 1 tablet (100 mg total) by mouth at bedtime as needed for sleep. 03/28/15   Kerrie Buffalo, NP   BP 120/81 mmHg  Pulse 98  Temp(Src) 98.4 F (36.9 C) (Oral)  Resp 15  Ht 5\' 1"  (1.549 m)  Wt 130 lb 4.8 oz (59.104 kg)  BMI 24.63 kg/m2  SpO2 100%  LMP 08/28/2015 Physical Exam  Constitutional: She is oriented to person, place, and time. She appears well-developed and well-nourished. No distress.  HENT:  Head: Normocephalic and atraumatic.  Right Ear: External ear normal.  Left Ear: External ear normal.  Nose: Nose normal.  Mouth/Throat: Oropharynx is clear and moist. No oropharyngeal exudate.  Eyes: EOM are normal. Pupils are  equal, round, and reactive to light.  Neck: Normal range of motion. Neck supple. No muscular tenderness present.  Cardiovascular: Normal rate, regular rhythm, normal heart sounds and intact distal pulses.   No murmur heard. Pulmonary/Chest: Effort normal. No respiratory distress. She has no wheezes. She has no rales.  Abdominal: Soft. She exhibits no distension. There is no tenderness.  Musculoskeletal: Normal range of motion. She exhibits no edema or tenderness.  Neurological: She is alert and oriented to person, place, and time. She has normal strength. No cranial nerve deficit or sensory deficit. Coordination normal.  Normal finger to nose and heel to shin examinations bilaterally  Skin: Skin is warm and dry. No rash noted. She is not diaphoretic.  Vitals reviewed.   ED Course  Procedures (including critical care time) Labs Review Labs Reviewed  BASIC METABOLIC PANEL - Abnormal; Notable for the following:    Glucose,  Bld 109 (*)    All other components within normal limits  CBC  I-STAT TROPOININ, ED    Imaging Review Dg Chest 2 View  09/17/2015  CLINICAL DATA:  Tightness in chest.  Left arm numbness. EXAM: CHEST  2 VIEW COMPARISON:  05/02/2015 FINDINGS: The heart size and mediastinal contours are within normal limits. Both lungs are clear. The visualized skeletal structures are unremarkable. IMPRESSION: No active cardiopulmonary disease. Electronically Signed   By: Kerby Moors M.D.   On: 09/17/2015 16:00   I have personally reviewed and evaluated these images and lab results as part of my medical decision-making.   EKG Interpretation   Date/Time:  Tuesday September 17 2015 15:38:15 EST Ventricular Rate:  100 PR Interval:  138 QRS Duration: 78 QT Interval:  356 QTC Calculation: 459 R Axis:   74 Text Interpretation:  Normal sinus rhythm Normal ECG Rate has increased  from previous tracings Confirmed by Kylor Valverde ([id number]) on 09/17/2015  3:59:03 PM      MDM   Patient was seen and evaluated in stable condition.   History and physical examination appears most consistent with possible cervical radiculopathy.  Neuro examination normal and NIHSS 0 on examination.  Patient was given Motrin, Valium, Zofran with improvement in symptoms.  Chest xray, EKG, troponin x2 normal.  Head CT unremarkable.  Neck Xray with DDD and retrolisthesis and anterolisthesis.  Findings on imaging and labs seem most consistent with cervical radiculopathy.  Discussed at length with patinet and her husband the results and clinical impression which they both expressed understanding and agreement with.  Strict return precautions for any stroke like symptoms or cord compression symptoms discussed at length which they expressed understanding of.  Patient was discharged home in stable condition to follow up outpatient. Final diagnoses:  None    1. Likely cervical radiculopathy    Harvel Quale, MD 09/21/15 [phone]

## 2015-09-17 NOTE — ED Notes (Signed)
Pt left with all her belongings and ambulated out of the treatment area.  

## 2015-09-17 NOTE — ED Notes (Signed)
This RN ambulated pt to bathroom.  Pt was slightly unsteady and held onto rail on wall for support.  Pt reports feeling dizzy.  MD made aware.

## 2015-09-17 NOTE — Discharge Instructions (Signed)
You were seen today for your arm and chest pain as well as the tingling.  Take the medication as prescribed.  Follow up with your pain specialist.  Return for sudden weakness, loss of sensation, inability to urinate, slurred speech, facial droop.  Cervical Radiculopathy Cervical radiculopathy happens when a nerve in the neck (cervical nerve) is pinched or bruised. This condition can develop because of an injury or as part of the normal aging process. Pressure on the cervical nerves can cause pain or numbness that runs from the neck all the way down into the arm and fingers. Usually, this condition gets better with rest. Treatment may be needed if the condition does not improve.  CAUSES This condition may be caused by:  Injury.  Slipped (herniated) disk.  Muscle tightness in the neck because of overuse.  Arthritis.  Breakdown or degeneration in the bones and joints of the spine (spondylosis) due to aging.  Bone spurs that may develop near the cervical nerves. SYMPTOMS Symptoms of this condition include:  Pain that runs from the neck to the arm and hand. The pain can be severe or irritating. It may be worse when the neck is moved.  Numbness or weakness in the affected arm and hand. DIAGNOSIS This condition may be diagnosed based on symptoms, medical history, and a physical exam. You may also have tests, including:  X-rays.  CT scan.  MRI.  Electromyogram (EMG).  Nerve conduction tests. TREATMENT In many cases, treatment is not needed for this condition. With rest, the condition usually gets better over time. If treatment is needed, options may include:  Wearing a soft neck collar for short periods of time.  Physical therapy to strengthen your neck muscles.  Medicines, such as NSAIDs, oral corticosteroids, or spinal injections.  Surgery. This may be needed if other treatments do not help. Various types of surgery may be done depending on the cause of your problems. HOME  CARE INSTRUCTIONS Managing Pain  Take over-the-counter and prescription medicines only as told by your health care provider.  If directed, apply ice to the affected area.  Put ice in a plastic bag.  Place a towel between your skin and the bag.  Leave the ice on for 20 minutes, 2-3 times per day.  If ice does not help, you can try using heat. Take a warm shower or warm bath, or use a heat pack as told by your health care provider.  Try a gentle neck and shoulder massage to help relieve symptoms. Activity  Rest as needed. Follow instructions from your health care provider about any restrictions on activities.  Do stretching and strengthening exercises as told by your health care provider or physical therapist. General Instructions  If you were given a soft collar, wear it as told by your health care provider.  Use a flat pillow when you sleep.  Keep all follow-up visits as told by your health care provider. This is important. SEEK MEDICAL CARE IF:  Your condition does not improve with treatment. SEEK IMMEDIATE MEDICAL CARE IF:  Your pain gets much worse and cannot be controlled with medicines.  You have weakness or numbness in your hand, arm, face, or leg.  You have a high fever.  You have a stiff, rigid neck.  You lose control of your bowels or your bladder (have incontinence).  You have trouble with walking, balance, or speaking.   This information is not intended to replace advice given to you by your health care provider. Make  sure you discuss any questions you have with your health care provider.   Document Released: 07/07/2001 Document Revised: 07/03/2015 Document Reviewed: 12/06/2014 Elsevier Interactive Patient Education [employer].

## 2015-09-17 NOTE — ED Notes (Signed)
Patient transported to X-ray 

## 2015-09-17 NOTE — ED Notes (Signed)
MD at bedside. 

## 2015-09-23 DIAGNOSIS — E039 Hypothyroidism, unspecified: Secondary | ICD-10-CM | POA: Diagnosis not present

## 2015-09-23 DIAGNOSIS — E782 Mixed hyperlipidemia: Secondary | ICD-10-CM | POA: Diagnosis not present

## 2015-09-23 DIAGNOSIS — E559 Vitamin D deficiency, unspecified: Secondary | ICD-10-CM | POA: Diagnosis not present

## 2015-09-23 DIAGNOSIS — Z79891 Long term (current) use of opiate analgesic: Secondary | ICD-10-CM | POA: Diagnosis not present

## 2015-09-23 DIAGNOSIS — F329 Major depressive disorder, single episode, unspecified: Secondary | ICD-10-CM | POA: Diagnosis not present

## 2015-10-14 ENCOUNTER — Emergency Department (HOSPITAL_COMMUNITY): Payer: Commercial Managed Care - HMO

## 2015-10-14 ENCOUNTER — Emergency Department (HOSPITAL_COMMUNITY)
Admission: EM | Admit: 2015-10-14 | Discharge: 2015-10-14 | Disposition: A | Discharge status: Home / Self Care | Payer: Commercial Managed Care - HMO | Attending: Emergency Medicine | Admitting: Emergency Medicine

## 2015-10-14 ENCOUNTER — Encounter (HOSPITAL_COMMUNITY): Payer: Self-pay

## 2015-10-14 DIAGNOSIS — Y9389 Activity, other specified: Secondary | ICD-10-CM | POA: Diagnosis not present

## 2015-10-14 DIAGNOSIS — Z8619 Personal history of other infectious and parasitic diseases: Secondary | ICD-10-CM | POA: Insufficient documentation

## 2015-10-14 DIAGNOSIS — Z7952 Long term (current) use of systemic steroids: Secondary | ICD-10-CM | POA: Insufficient documentation

## 2015-10-14 DIAGNOSIS — S0990XA Unspecified injury of head, initial encounter: Secondary | ICD-10-CM | POA: Diagnosis not present

## 2015-10-14 DIAGNOSIS — G40909 Epilepsy, unspecified, not intractable, without status epilepticus: Secondary | ICD-10-CM | POA: Insufficient documentation

## 2015-10-14 DIAGNOSIS — R519 Headache, unspecified: Secondary | ICD-10-CM

## 2015-10-14 DIAGNOSIS — Z792 Long term (current) use of antibiotics: Secondary | ICD-10-CM | POA: Insufficient documentation

## 2015-10-14 DIAGNOSIS — F329 Major depressive disorder, single episode, unspecified: Secondary | ICD-10-CM | POA: Diagnosis not present

## 2015-10-14 DIAGNOSIS — T148 Other injury of unspecified body region: Secondary | ICD-10-CM | POA: Diagnosis not present

## 2015-10-14 DIAGNOSIS — M542 Cervicalgia: Secondary | ICD-10-CM

## 2015-10-14 DIAGNOSIS — S199XXA Unspecified injury of neck, initial encounter: Secondary | ICD-10-CM | POA: Diagnosis not present

## 2015-10-14 DIAGNOSIS — Y92410 Unspecified street and highway as the place of occurrence of the external cause: Secondary | ICD-10-CM | POA: Insufficient documentation

## 2015-10-14 DIAGNOSIS — S8992XA Unspecified injury of left lower leg, initial encounter: Secondary | ICD-10-CM | POA: Insufficient documentation

## 2015-10-14 DIAGNOSIS — S299XXA Unspecified injury of thorax, initial encounter: Secondary | ICD-10-CM | POA: Diagnosis not present

## 2015-10-14 DIAGNOSIS — R1011 Right upper quadrant pain: Secondary | ICD-10-CM | POA: Diagnosis not present

## 2015-10-14 DIAGNOSIS — R51 Headache: Secondary | ICD-10-CM

## 2015-10-14 DIAGNOSIS — Y999 Unspecified external cause status: Secondary | ICD-10-CM | POA: Diagnosis not present

## 2015-10-14 DIAGNOSIS — Z79899 Other long term (current) drug therapy: Secondary | ICD-10-CM | POA: Diagnosis not present

## 2015-10-14 DIAGNOSIS — Z8739 Personal history of other diseases of the musculoskeletal system and connective tissue: Secondary | ICD-10-CM | POA: Diagnosis not present

## 2015-10-14 DIAGNOSIS — F419 Anxiety disorder, unspecified: Secondary | ICD-10-CM | POA: Insufficient documentation

## 2015-10-14 DIAGNOSIS — R10811 Right upper quadrant abdominal tenderness: Secondary | ICD-10-CM | POA: Diagnosis not present

## 2015-10-14 DIAGNOSIS — M79605 Pain in left leg: Secondary | ICD-10-CM

## 2015-10-14 DIAGNOSIS — M79602 Pain in left arm: Secondary | ICD-10-CM

## 2015-10-14 DIAGNOSIS — R1084 Generalized abdominal pain: Secondary | ICD-10-CM

## 2015-10-14 DIAGNOSIS — M79642 Pain in left hand: Secondary | ICD-10-CM | POA: Diagnosis not present

## 2015-10-14 DIAGNOSIS — S3991XA Unspecified injury of abdomen, initial encounter: Secondary | ICD-10-CM | POA: Diagnosis not present

## 2015-10-14 DIAGNOSIS — S4992XA Unspecified injury of left shoulder and upper arm, initial encounter: Secondary | ICD-10-CM | POA: Diagnosis not present

## 2015-10-14 LAB — COMPREHENSIVE METABOLIC PANEL
ALBUMIN: 3.9 g/dL (ref 3.5–5.0)
ALT: 17 U/L (ref 14–54)
AST: 19 U/L (ref 15–41)
Alkaline Phosphatase: 61 U/L (ref 38–126)
Anion gap: 7 (ref 5–15)
BUN: 21 mg/dL — AB (ref 6–20)
CHLORIDE: 106 mmol/L (ref 101–111)
CO2: 26 mmol/L (ref 22–32)
Calcium: 9.2 mg/dL (ref 8.9–10.3)
Creatinine, Ser: 1.01 mg/dL — ABNORMAL HIGH (ref 0.44–1.00)
GFR calc Af Amer: >60 mL/min (ref >60)
Glucose, Bld: 96 mg/dL (ref 65–99)
POTASSIUM: 4.8 mmol/L (ref 3.5–5.1)
Sodium: 139 mmol/L (ref 135–145)
Total Bilirubin: 0.5 mg/dL (ref 0.3–1.2)
Total Protein: 6.4 g/dL — ABNORMAL LOW (ref 6.5–8.1)

## 2015-10-14 LAB — CBC WITH DIFFERENTIAL/PLATELET
BASOS ABS: 0 10*3/uL (ref 0.0–0.1)
BASOS PCT: 1 %
EOS PCT: 4 %
Eosinophils Absolute: 0.2 10*3/uL (ref 0.0–0.7)
HCT: 39 % (ref 36.0–46.0)
Hemoglobin: 13 g/dL (ref 12.0–15.0)
Lymphocytes Relative: 36 %
Lymphs Abs: 2 10*3/uL (ref 0.7–4.0)
MCH: 33 pg (ref 26.0–34.0)
MCHC: 33.3 g/dL (ref 30.0–36.0)
MCV: 99 fL (ref 78.0–100.0)
MONO ABS: 0.4 10*3/uL (ref 0.1–1.0)
Monocytes Relative: 7 %
Neutro Abs: 3 10*3/uL (ref 1.7–7.7)
Neutrophils Relative %: 52 %
PLATELETS: 426 10*3/uL — AB (ref 150–400)
RBC: 3.94 MIL/uL (ref 3.87–5.11)
RDW: 13.8 % (ref 11.5–15.5)
WBC: 5.7 10*3/uL (ref 4.0–10.5)

## 2015-10-14 LAB — POC URINE PREG, ED: PREG TEST UR: NEGATIVE

## 2015-10-14 MED ORDER — HYDROCODONE-ACETAMINOPHEN 5-325 MG PO TABS
1.0000 | ORAL_TABLET | Freq: Once | ORAL | Status: AC
  Administered 2015-10-14: 1 via ORAL
  Filled 2015-10-14: qty 1

## 2015-10-14 MED ORDER — IOHEXOL 300 MG/ML  SOLN
75.0000 mL | Freq: Once | INTRAMUSCULAR | Status: AC | PRN
  Administered 2015-10-14: 75 mL via INTRAVENOUS

## 2015-10-14 MED ORDER — SODIUM CHLORIDE 0.9 % IV BOLUS (SEPSIS)
1000.0000 mL | Freq: Once | INTRAVENOUS | Status: AC
  Administered 2015-10-14: 1000 mL via INTRAVENOUS

## 2015-10-14 MED ORDER — HYDROMORPHONE HCL 1 MG/ML IJ SOLN
0.5000 mg | Freq: Once | INTRAMUSCULAR | Status: AC
  Administered 2015-10-14: 0.5 mg via INTRAVENOUS
  Filled 2015-10-14: qty 1

## 2015-10-14 MED ORDER — HYDROCODONE-ACETAMINOPHEN 5-325 MG PO TABS: 1.0000 | ORAL_TABLET | Freq: Four times a day (QID) | ORAL | Status: DC | PRN

## 2015-10-14 MED ORDER — ONDANSETRON HCL 4 MG/2ML IJ SOLN
4.0000 mg | Freq: Once | INTRAMUSCULAR | Status: AC
  Administered 2015-10-14: 4 mg via INTRAVENOUS
  Filled 2015-10-14: qty 2

## 2015-10-14 NOTE — ED Notes (Signed)
C-cspine cleared by ED resident.
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
## 2015-10-14 NOTE — Discharge Instructions (Signed)

## 2015-10-25 ENCOUNTER — Encounter: Payer: Self-pay | Admitting: Internal Medicine

## 2015-10-31 DIAGNOSIS — E559 Vitamin D deficiency, unspecified: Secondary | ICD-10-CM | POA: Diagnosis not present

## 2015-10-31 DIAGNOSIS — E7800 Pure hypercholesterolemia, unspecified: Secondary | ICD-10-CM | POA: Diagnosis not present

## 2015-10-31 DIAGNOSIS — J209 Acute bronchitis, unspecified: Secondary | ICD-10-CM | POA: Diagnosis not present

## 2015-10-31 DIAGNOSIS — R0602 Shortness of breath: Secondary | ICD-10-CM | POA: Diagnosis not present

## 2015-11-08 DIAGNOSIS — J019 Acute sinusitis, unspecified: Secondary | ICD-10-CM | POA: Diagnosis not present

## 2015-11-12 DIAGNOSIS — M47812 Spondylosis without myelopathy or radiculopathy, cervical region: Secondary | ICD-10-CM | POA: Diagnosis not present

## 2015-11-19 ENCOUNTER — Emergency Department (HOSPITAL_COMMUNITY): Payer: Commercial Managed Care - HMO

## 2015-11-19 ENCOUNTER — Observation Stay (HOSPITAL_COMMUNITY)
Admission: EM | Admit: 2015-11-19 | Discharge: 2015-11-22 | Disposition: A | Discharge status: Home / Self Care | Payer: Commercial Managed Care - HMO | Attending: Internal Medicine | Admitting: Internal Medicine

## 2015-11-19 ENCOUNTER — Encounter (HOSPITAL_COMMUNITY): Payer: Self-pay | Admitting: Adult Health

## 2015-11-19 DIAGNOSIS — R002 Palpitations: Secondary | ICD-10-CM | POA: Insufficient documentation

## 2015-11-19 DIAGNOSIS — R55 Syncope and collapse: Principal | ICD-10-CM | POA: Insufficient documentation

## 2015-11-19 DIAGNOSIS — S0181XA Laceration without foreign body of other part of head, initial encounter: Secondary | ICD-10-CM | POA: Diagnosis not present

## 2015-11-19 DIAGNOSIS — F319 Bipolar disorder, unspecified: Secondary | ICD-10-CM | POA: Diagnosis not present

## 2015-11-19 DIAGNOSIS — Y92002 Bathroom of unspecified non-institutional (private) residence single-family (private) house as the place of occurrence of the external cause: Secondary | ICD-10-CM | POA: Insufficient documentation

## 2015-11-19 DIAGNOSIS — I4581 Long QT syndrome: Secondary | ICD-10-CM | POA: Insufficient documentation

## 2015-11-19 DIAGNOSIS — Z886 Allergy status to analgesic agent status: Secondary | ICD-10-CM | POA: Insufficient documentation

## 2015-11-19 DIAGNOSIS — I361 Nonrheumatic tricuspid (valve) insufficiency: Secondary | ICD-10-CM | POA: Diagnosis not present

## 2015-11-19 DIAGNOSIS — S0003XA Contusion of scalp, initial encounter: Secondary | ICD-10-CM | POA: Diagnosis not present

## 2015-11-19 DIAGNOSIS — Y998 Other external cause status: Secondary | ICD-10-CM | POA: Diagnosis not present

## 2015-11-19 DIAGNOSIS — F1290 Cannabis use, unspecified, uncomplicated: Secondary | ICD-10-CM | POA: Diagnosis not present

## 2015-11-19 DIAGNOSIS — F419 Anxiety disorder, unspecified: Secondary | ICD-10-CM | POA: Insufficient documentation

## 2015-11-19 DIAGNOSIS — R404 Transient alteration of awareness: Secondary | ICD-10-CM | POA: Diagnosis not present

## 2015-11-19 DIAGNOSIS — S0101XA Laceration without foreign body of scalp, initial encounter: Secondary | ICD-10-CM | POA: Insufficient documentation

## 2015-11-19 DIAGNOSIS — M4322 Fusion of spine, cervical region: Secondary | ICD-10-CM | POA: Diagnosis not present

## 2015-11-19 DIAGNOSIS — R51 Headache: Secondary | ICD-10-CM | POA: Diagnosis not present

## 2015-11-19 DIAGNOSIS — I351 Nonrheumatic aortic (valve) insufficiency: Secondary | ICD-10-CM | POA: Diagnosis not present

## 2015-11-19 DIAGNOSIS — S92421A Displaced fracture of distal phalanx of right great toe, initial encounter for closed fracture: Secondary | ICD-10-CM | POA: Diagnosis not present

## 2015-11-19 DIAGNOSIS — W010XXA Fall on same level from slipping, tripping and stumbling without subsequent striking against object, initial encounter: Secondary | ICD-10-CM | POA: Diagnosis not present

## 2015-11-19 DIAGNOSIS — Y9389 Activity, other specified: Secondary | ICD-10-CM | POA: Insufficient documentation

## 2015-11-19 DIAGNOSIS — W19XXXA Unspecified fall, initial encounter: Secondary | ICD-10-CM

## 2015-11-19 HISTORY — DX: Bipolar disorder, unspecified: F31.9

## 2015-11-19 LAB — URINALYSIS, ROUTINE W REFLEX MICROSCOPIC
BILIRUBIN URINE: NEGATIVE
GLUCOSE, UA: NEGATIVE mg/dL
Hgb urine dipstick: NEGATIVE
Ketones, ur: NEGATIVE mg/dL
Leukocytes, UA: NEGATIVE
NITRITE: NEGATIVE
PH: 7.5 (ref 5.0–8.0)
Protein, ur: NEGATIVE mg/dL
SPECIFIC GRAVITY, URINE: 1.019 (ref 1.005–1.030)

## 2015-11-19 LAB — BASIC METABOLIC PANEL
ANION GAP: 13 (ref 5–15)
BUN: 13 mg/dL (ref 6–20)
CHLORIDE: 105 mmol/L (ref 101–111)
CO2: 26 mmol/L (ref 22–32)
Calcium: 9.2 mg/dL (ref 8.9–10.3)
Creatinine, Ser: 0.96 mg/dL (ref 0.44–1.00)
GFR calc Af Amer: >60 mL/min (ref >60)
GLUCOSE: 97 mg/dL (ref 65–99)
POTASSIUM: 4.6 mmol/L (ref 3.5–5.1)
Sodium: 144 mmol/L (ref 135–145)

## 2015-11-19 LAB — ETHANOL: ALCOHOL ETHYL (B): 28 mg/dL — AB (ref <5)

## 2015-11-19 LAB — I-STAT BETA HCG BLOOD, ED (MC, WL, AP ONLY)

## 2015-11-19 LAB — LIPASE, BLOOD: Lipase: 36 U/L (ref 11–51)

## 2015-11-19 LAB — CBC
HEMATOCRIT: 39.3 % (ref 36.0–46.0)
HEMOGLOBIN: 13.3 g/dL (ref 12.0–15.0)
MCH: 33 pg (ref 26.0–34.0)
MCHC: 33.8 g/dL (ref 30.0–36.0)
MCV: 97.5 fL (ref 78.0–100.0)
Platelets: 262 10*3/uL (ref 150–400)
RBC: 4.03 MIL/uL (ref 3.87–5.11)
RDW: 13.3 % (ref 11.5–15.5)
WBC: 7.4 10*3/uL (ref 4.0–10.5)

## 2015-11-19 LAB — HEPATIC FUNCTION PANEL
ALBUMIN: 3.9 g/dL (ref 3.5–5.0)
ALK PHOS: 56 U/L (ref 38–126)
ALT: 17 U/L (ref 14–54)
AST: 22 U/L (ref 15–41)
BILIRUBIN TOTAL: 0.2 mg/dL — AB (ref 0.3–1.2)
TOTAL PROTEIN: 6.5 g/dL (ref 6.5–8.1)

## 2015-11-19 LAB — RAPID URINE DRUG SCREEN, HOSP PERFORMED
AMPHETAMINES: NOT DETECTED
BENZODIAZEPINES: NOT DETECTED
Barbiturates: NOT DETECTED
Cocaine: NOT DETECTED
Opiates: NOT DETECTED
TETRAHYDROCANNABINOL: POSITIVE — AB

## 2015-11-19 LAB — CBG MONITORING, ED: Glucose-Capillary: 87 mg/dL (ref 65–99)

## 2015-11-19 MED ORDER — LORAZEPAM 2 MG/ML IJ SOLN
1.0000 mg | Freq: Once | INTRAMUSCULAR | Status: AC
  Administered 2015-11-19: 1 mg via INTRAVENOUS
  Filled 2015-11-19: qty 1

## 2015-11-19 MED ORDER — ONDANSETRON HCL 4 MG/2ML IJ SOLN
4.0000 mg | Freq: Once | INTRAMUSCULAR | Status: AC
  Administered 2015-11-19: 4 mg via INTRAVENOUS

## 2015-11-19 MED ORDER — FENTANYL CITRATE (PF) 100 MCG/2ML IJ SOLN
25.0000 ug | Freq: Once | INTRAMUSCULAR | Status: AC
  Administered 2015-11-19: 25 ug via INTRAVENOUS
  Filled 2015-11-19: qty 2

## 2015-11-19 MED ORDER — ONDANSETRON HCL 4 MG/2ML IJ SOLN
INTRAMUSCULAR | Status: AC
  Filled 2015-11-19: qty 2

## 2015-11-19 MED ORDER — LIDOCAINE HCL (PF) 1 % IJ SOLN
30.0000 mL | Freq: Once | INTRAMUSCULAR | Status: DC
  Filled 2015-11-19: qty 30

## 2015-11-19 MED ORDER — TETANUS-DIPHTHERIA TOXOIDS TD 5-2 LFU IM INJ
0.5000 mL | INJECTION | Freq: Once | INTRAMUSCULAR | Status: AC
  Administered 2015-11-19: 0.5 mL via INTRAMUSCULAR
  Filled 2015-11-19: qty 0.5

## 2015-11-19 MED ORDER — SODIUM CHLORIDE 0.9 % IV SOLN
INTRAVENOUS | Status: DC
  Administered 2015-11-19: 23:00:00 via INTRAVENOUS

## 2015-11-19 MED ORDER — FENTANYL CITRATE (PF) 100 MCG/2ML IJ SOLN
50.0000 ug | Freq: Once | INTRAMUSCULAR | Status: AC
  Administered 2015-11-19: 50 ug via INTRAVENOUS
  Filled 2015-11-19: qty 2

## 2015-11-19 MED ORDER — SODIUM CHLORIDE 0.9 % IV BOLUS (SEPSIS)
500.0000 mL | Freq: Once | INTRAVENOUS | Status: AC
  Administered 2015-11-19: 500 mL via INTRAVENOUS

## 2015-11-19 NOTE — ED Notes (Addendum)
Presents post syncopal episode x 3 witnessed by husband-per pt was getting into shower and feeling felt SOB and heart was racing-like having anxiety attack with some lightheadedness-called to husband and when husband came in pt was bleeding from forehead, when he entered bathroom she tuned and syncoped again falling backward and hitting head on floor, she then had 2 more syncopal episodes with husband. Husband believes she had a total of 4 today. She is anxious, alert, oriented, endorses pain in head. Large laceration to from of head at scal line and one to occiput, c-collar applied, orthostatic with EMS, 84 CBG. No pain to extremities, endorses nausea, denies vomiting and diarrhea recently. Perrla-given 200 ml of NS by EMS.  Bartholome Bill not taken lamictal in about 4-5 days

## 2015-11-19 NOTE — ED Provider Notes (Addendum)
CSN: [id number]     Arrival date & time 11/19/15  1912 History   First MD Initiated Contact with Patient 11/19/15 2010     Chief Complaint  Patient presents with  . Loss of Consciousness     (Consider location/radiation/quality/duration/timing/severity/associated sxs/prior Treatment) Patient is a 51 y.o. female presenting with syncope. The history is provided by the patient and the spouse.  Loss of Consciousness Associated symptoms: no chest pain, no dizziness, no fever, no headaches, no seizures, no shortness of breath and no weakness    patient with approximately 5 syncopal episodes today. 2 of them resulting in scalp lacerations. Patient fell backward on one with a laceration to the back of her head passed out forward on another resulting in a laceration along the scalp line at the 4 head and a stellate laceration to the left for head. Patient not really without any significant warnings prior to passing out. Patient states that she has felt like she's been having panic attacks and anxiety she has those frequently has never passed out before. Patient without any specific complaints. Patient's had prior neck surgery. Neck is a little bit sore but the nose significantly new neck pain. Spouse states that done no seizure activity noted.  Past Medical History  Diagnosis Date  . Depression   . DDD (degenerative disc disease)   . DDD (degenerative disc disease)   . DDD (degenerative disc disease)   . Pertussis   . Seizures ([place])   . Anxiety    Past Surgical History  Procedure Laterality Date  . Cervical spine surgery    . Cesarean section    . Tubal ligation    . Tonsillectomy     History reviewed. No pertinent family history. Social History  Substance Use Topics  . Smoking status: Never Smoker   . Smokeless tobacco: None  . Alcohol Use: No     Comment: quit drinking 7 months   OB History    No data available     Review of Systems  Constitutional: Negative for fever.  HENT:  Negative for congestion and trouble swallowing.   Eyes: Negative for visual disturbance.  Respiratory: Negative for shortness of breath.   Cardiovascular: Positive for syncope. Negative for chest pain.  Gastrointestinal: Negative for abdominal pain.  Genitourinary: Negative for dysuria.  Musculoskeletal: Positive for neck pain. Negative for back pain.  Skin: Positive for wound. Negative for rash.  Neurological: Positive for syncope. Negative for dizziness, seizures, speech difficulty, weakness, light-headedness and headaches.  Hematological: Does not bruise/bleed easily.  Psychiatric/Behavioral: The patient is nervous/anxious.       Allergies  Naproxen  Home Medications   Prior to Admission medications   Medication Sig Start Date End Date Taking? Authorizing Provider  acetaminophen (TYLENOL) 500 MG tablet Take 500 mg by mouth every 6 (six) hours as needed for mild pain.   Yes Historical Provider, MD  Aspirin-Salicylamide-Caffeine (BC HEADACHE POWDER PO) Take by mouth.   Yes Historical Provider, MD  DULoxetine (CYMBALTA) 60 MG capsule Take 1 capsule (60 mg total) by mouth 2 (two) times daily. Patient taking differently: Take 30 mg by mouth 2 (two) times daily.  03/28/15  Yes Kerrie Buffalo, NP  furosemide (LASIX) 20 MG tablet Take 1 tablet (20 mg total) by mouth daily as needed for fluid. 03/28/15  Yes Kerrie Buffalo, NP  lamoTRIgine (LAMICTAL) 100 MG tablet Take 100 mg by mouth every morning. 08/02/15  Yes Historical Provider, MD  levothyroxine (SYNTHROID, LEVOTHROID) 25 MCG tablet Take  1 tablet (25 mcg total) by mouth daily before breakfast. 03/28/15  Yes Kerrie Buffalo, NP  trimethoprim (TRIMPEX) 100 MG tablet Take 1 tablet (100 mg total) by mouth daily. 03/28/15  Yes Kerrie Buffalo, NP  diazepam (VALIUM) 5 MG tablet Take 1 tablet (5 mg total) by mouth every 6 (six) hours as needed for muscle spasms. Patient not taking: Reported on 10/14/2015 09/17/15   Harvel Quale, MD   HYDROcodone-acetaminophen (NORCO/VICODIN) 5-325 MG tablet Take 1 tablet by mouth every 6 (six) hours as needed for moderate pain. 10/14/15   Esaw Grandchild, MD  hydrOXYzine (ATARAX/VISTARIL) 50 MG tablet Take 1 tablet (50 mg total) by mouth every 6 (six) hours as needed for anxiety. Patient not taking: Reported on 10/14/2015 03/28/15   Kerrie Buffalo, NP  ibuprofen (ADVIL,MOTRIN) 600 MG tablet Take 1 tablet (600 mg total) by mouth every 6 (six) hours as needed. Patient not taking: Reported on 10/14/2015 09/17/15   Harvel Quale, MD  levETIRAcetam (KEPPRA) 500 MG tablet Take 1 tablet (500 mg total) by mouth 2 (two) times daily. Patient not taking: Reported on 10/14/2015 03/28/15   Kerrie Buffalo, NP  omeprazole (PRILOSEC) 20 MG capsule Take 1 capsule (20 mg total) by mouth daily as needed (heartburn). Patient not taking: Reported on 10/14/2015 03/28/15   Kerrie Buffalo, NP  potassium chloride (K-DUR) 10 MEQ tablet Take 1 tablet (10 mEq total) by mouth daily as needed (with furosemide). Patient not taking: Reported on 11/19/2015 03/28/15   Kerrie Buffalo, NP  prazosin (MINIPRESS) 1 MG capsule Take 1 capsule (1 mg total) by mouth at bedtime. Patient not taking: Reported on 10/14/2015 03/28/15   Kerrie Buffalo, NP  predniSONE (DELTASONE) 20 MG tablet Take 3 tablets (60 mg total) by mouth daily. Patient not taking: Reported on 10/14/2015 05/26/15   Debby Freiberg, MD  pregabalin (LYRICA) 50 MG capsule Take 1 capsule (50 mg total) by mouth 3 (three) times daily. Patient not taking: Reported on 10/14/2015 03/28/15   Kerrie Buffalo, NP  traZODone (DESYREL) 100 MG tablet Take 1 tablet (100 mg total) by mouth at bedtime as needed for sleep. Patient not taking: Reported on 11/19/2015 03/28/15   Kerrie Buffalo, NP   BP 116/69 mmHg  Pulse 90  Temp(Src) 98.7 F (37.1 C) (Oral)  Resp 15  Wt 54.432 kg  SpO2 98%  LMP 10/24/2015 (Exact Date) Physical Exam  Constitutional: She is oriented to person, place, and time.  She appears well-developed and well-nourished.  HENT:  Mouth/Throat: Oropharynx is clear and moist.  Posterior scalp laceration measuring about 5 cm. Anterior for head along the scalp line laceration measuring 6.5 cm. Left for head with a stellate laceration measuring about 3 cm.  Eyes: Conjunctivae and EOM are normal. Pupils are equal, round, and reactive to light.  Neck:  Cervical collar in place.  Cardiovascular: Normal rate, regular rhythm and normal heart sounds.   Pulmonary/Chest: Effort normal and breath sounds normal. No respiratory distress.  Abdominal: Soft. Bowel sounds are normal. There is no tenderness.  Musculoskeletal: Normal range of motion. She exhibits no edema or tenderness.  Neurological: She is alert and oriented to person, place, and time. No cranial nerve deficit. She exhibits normal muscle tone. Coordination normal.  Skin: Skin is warm. No rash noted.  Nursing note and vitals reviewed.   ED Course  Procedures (including critical care time) Labs Review Labs Reviewed  URINALYSIS, ROUTINE W REFLEX MICROSCOPIC (NOT AT [facility]) - Abnormal; Notable for the following:    APPearance  CLOUDY (*)    All other components within normal limits  URINE RAPID DRUG SCREEN, HOSP PERFORMED - Abnormal; Notable for the following:    Tetrahydrocannabinol POSITIVE (*)    All other components within normal limits  ETHANOL - Abnormal; Notable for the following:    Alcohol, Ethyl (B) 28 (*)    All other components within normal limits  HEPATIC FUNCTION PANEL - Abnormal; Notable for the following:    Total Bilirubin 0.2 (*)    Bilirubin, Direct <0.1 (*)    All other components within normal limits  BASIC METABOLIC PANEL  CBC  LIPASE, BLOOD  CBG MONITORING, ED  I-STAT BETA HCG BLOOD, ED ([facility], [facility], [facility] ONLY)   Results for orders placed or performed during the hospital encounter of [id number]  Basic metabolic panel  Result Value Ref Range   Sodium 144 135 - 145 mmol/L   Potassium 4.6 3.5  - 5.1 mmol/L   Chloride 105 101 - 111 mmol/L   CO2 26 22 - 32 mmol/L   Glucose, Bld 97 65 - 99 mg/dL   BUN 13 6 - 20 mg/dL   Creatinine, Ser 0.96 0.44 - 1.00 mg/dL   Calcium 9.2 8.9 - 10.3 mg/dL   GFR calc non Af Amer >60 >60 mL/min   GFR calc Af Amer >60 >60 mL/min   Anion gap 13 5 - 15  CBC  Result Value Ref Range   WBC 7.4 4.0 - 10.5 K/uL   RBC 4.03 3.87 - 5.11 MIL/uL   Hemoglobin 13.3 12.0 - 15.0 g/dL   HCT 39.3 36.0 - 46.0 %   MCV 97.5 78.0 - 100.0 fL   MCH 33.0 26.0 - 34.0 pg   MCHC 33.8 30.0 - 36.0 g/dL   RDW 13.3 11.5 - 15.5 %   Platelets 262 150 - 400 K/uL  Urinalysis, Routine w reflex microscopic (not at [facility])  Result Value Ref Range   Color, Urine YELLOW YELLOW   APPearance CLOUDY (A) CLEAR   Specific Gravity, Urine 1.019 1.005 - 1.030   pH 7.5 5.0 - 8.0   Glucose, UA NEGATIVE NEGATIVE mg/dL   Hgb urine dipstick NEGATIVE NEGATIVE   Bilirubin Urine NEGATIVE NEGATIVE   Ketones, ur NEGATIVE NEGATIVE mg/dL   Protein, ur NEGATIVE NEGATIVE mg/dL   Nitrite NEGATIVE NEGATIVE   Leukocytes, UA NEGATIVE NEGATIVE  Urine rapid drug screen (hosp performed)  Result Value Ref Range   Opiates NONE DETECTED NONE DETECTED   Cocaine NONE DETECTED NONE DETECTED   Benzodiazepines NONE DETECTED NONE DETECTED   Amphetamines NONE DETECTED NONE DETECTED   Tetrahydrocannabinol POSITIVE (A) NONE DETECTED   Barbiturates NONE DETECTED NONE DETECTED  Ethanol  Result Value Ref Range   Alcohol, Ethyl (B) 28 (H) <5 mg/dL  Hepatic function panel  Result Value Ref Range   Total Protein 6.5 6.5 - 8.1 g/dL   Albumin 3.9 3.5 - 5.0 g/dL   AST 22 15 - 41 U/L   ALT 17 14 - 54 U/L   Alkaline Phosphatase 56 38 - 126 U/L   Total Bilirubin 0.2 (L) 0.3 - 1.2 mg/dL   Bilirubin, Direct <0.1 (L) 0.1 - 0.5 mg/dL   Indirect Bilirubin NOT CALCULATED 0.3 - 0.9 mg/dL  Lipase, blood  Result Value Ref Range   Lipase 36 11 - 51 U/L  CBG monitoring, ED  Result Value Ref Range   Glucose-Capillary 87 65 -  99 mg/dL  I-Stat Beta hCG blood, ED ([facility], [facility], [facility] only)  Result  Value Ref Range   I-stat hCG, quantitative <5.0 <5 mIU/mL   Comment 3             Imaging Review Dg Chest 2 View  11/19/2015  CLINICAL DATA:  Acute syncope and fall. EXAM: CHEST  2 VIEW COMPARISON:  10/14/2015 and prior exams FINDINGS: The cardiomediastinal silhouette is unremarkable. There is no evidence of focal airspace disease, pulmonary edema, suspicious pulmonary nodule/mass, pleural effusion, or pneumothorax. No acute bony abnormalities are identified. Cervical spine surgical hardware again noted. IMPRESSION: No active cardiopulmonary disease. Electronically Signed   By: Margarette Canada M.D.   On: 11/19/2015 22:02   Ct Head Wo Contrast  11/19/2015  CLINICAL DATA:  Patient fell in the bathroom today. Struck back of head. Laceration to the forehead. Passed out. Headache. EXAM: CT HEAD WITHOUT CONTRAST CT CERVICAL SPINE WITHOUT CONTRAST TECHNIQUE: Multidetector CT imaging of the head and cervical spine was performed following the standard protocol without intravenous contrast. Multiplanar CT image reconstructions of the cervical spine were also generated. COMPARISON:  10/14/2015 FINDINGS: CT HEAD FINDINGS Ventricles and sulci appear symmetrical. No ventricular dilatation. No mass effect or midline shift. No abnormal extra-axial fluid collections. Gray-white matter junctions are distinct. Basal cisterns are not effaced. No evidence of acute intracranial hemorrhage. No depressed skull fractures. Visualized paranasal sinuses and mastoid air cells are not opacified. Subcutaneous scalp hematoma and subcutaneous gas over the right anterior frontal region and posterior vertex consistent with lacerations and hematomas. CT CERVICAL SPINE FINDINGS Postoperative changes with anterior plate and screw fixation and intervertebral fusion from C4 through C7. Few segments appear intact with bony fusion demonstrated. Hardware components appear intact without  fracture or dislocation. Degenerative changes at C2-3, C3-4, and C7-T1. Normal alignment of the cervical spine. No prevertebral soft tissue swelling. No focal bone lesion or bone destruction. C1-2 articulation appears intact. Soft tissues are unremarkable. IMPRESSION: No acute intracranial abnormalities. Soft tissue lacerations with subcutaneous scalp hematomas over the right anterior frontal region and posterior vertex. Postoperative changes with anterior plate and screw fixation and intervertebral fusion from C4 through C7. Degenerative changes in the cervical spine. No acute displaced fractures identified. Electronically Signed   By: Lucienne Capers M.D.   On: 11/19/2015 22:18   Ct Cervical Spine Wo Contrast  11/19/2015  CLINICAL DATA:  Patient fell in the bathroom today. Struck back of head. Laceration to the forehead. Passed out. Headache. EXAM: CT HEAD WITHOUT CONTRAST CT CERVICAL SPINE WITHOUT CONTRAST TECHNIQUE: Multidetector CT imaging of the head and cervical spine was performed following the standard protocol without intravenous contrast. Multiplanar CT image reconstructions of the cervical spine were also generated. COMPARISON:  10/14/2015 FINDINGS: CT HEAD FINDINGS Ventricles and sulci appear symmetrical. No ventricular dilatation. No mass effect or midline shift. No abnormal extra-axial fluid collections. Gray-white matter junctions are distinct. Basal cisterns are not effaced. No evidence of acute intracranial hemorrhage. No depressed skull fractures. Visualized paranasal sinuses and mastoid air cells are not opacified. Subcutaneous scalp hematoma and subcutaneous gas over the right anterior frontal region and posterior vertex consistent with lacerations and hematomas. CT CERVICAL SPINE FINDINGS Postoperative changes with anterior plate and screw fixation and intervertebral fusion from C4 through C7. Few segments appear intact with bony fusion demonstrated. Hardware components appear intact  without fracture or dislocation. Degenerative changes at C2-3, C3-4, and C7-T1. Normal alignment of the cervical spine. No prevertebral soft tissue swelling. No focal bone lesion or bone destruction. C1-2 articulation appears intact. Soft tissues are unremarkable. IMPRESSION: No  acute intracranial abnormalities. Soft tissue lacerations with subcutaneous scalp hematomas over the right anterior frontal region and posterior vertex. Postoperative changes with anterior plate and screw fixation and intervertebral fusion from C4 through C7. Degenerative changes in the cervical spine. No acute displaced fractures identified. Electronically Signed   By: Lucienne Capers M.D.   On: 11/19/2015 22:18   I have personally reviewed and evaluated these images and lab results as part of my medical decision-making.   EKG Interpretation   Date/Time:  Tuesday November 19 2015 19:21:36 EST Ventricular Rate:  91 PR Interval:  138 QRS Duration: 78 QT Interval:  375 QTC Calculation: 461 R Axis:   79 Text Interpretation:  Sinus rhythm Confirmed by Cortne Amara  MD, Dusten Ellinwood  ([id number]) on 11/19/2015 7:23:56 PM      MDM   Final diagnoses:  Syncope, unspecified syncope type  Scalp laceration, initial encounter  Forehead laceration, initial encounter    Patient with multiple episodes of syncope at least 5 as per significant other. Patient fell at least backwards 1 striking the back of her scalp with a proximally 2 inch laceration. Also fell forward at least once resulting in a 2 inch laceration along the hairline of the 4 head and a stellate laceration to the left for head area. Patient without any significant warning prior to each of the syncopal episodes. Patient thought it was due to anxiety and panic attacks but she's had them many times before and is never passed out.  Workup here for the syncopal episodes and for the head injury without any acute findings. Patient without any arrhythmias labs without any significant  abnormality. Patient did have a slightly elevated blood alcohol level and this may have been a contributory factor. Urine drug screen is still pending. Laceration repair was done by mid-level.  [place] updated here. As stated above husband states no seizure activity noted. He did with this at least 2 of the syncopal episodes   Patient will require admission for the unexplained syncope.    Fredia Sorrow, MD 11/19/15 [id number]  Fredia Sorrow, MD 11/20/15 [id number]

## 2015-11-19 NOTE — ED Provider Notes (Signed)
This patient's care was managed by Dr. Rogene Houston. Please see his note for further. I handled 3 laceration repairs for Dr. Rogene Houston.  LACERATION REPAIR Performed by: Hanley Hays Authorized by: Hanley Hays Consent: Verbal consent obtained. Risks and benefits: risks, benefits and alternatives were discussed Consent given by: patient Patient identity confirmed: provided demographic data Prepped and Draped in normal sterile fashion Wound explored  Laceration Location: Right forehead just below her hairline  Laceration Length: 6.5 cm  No Foreign Bodies seen or palpated  Anesthesia: local infiltration  Local anesthetic: lidocaine 1% w/o epinephrine  Anesthetic total: 4 ml  Irrigation method: syringe Amount of cleaning: standard  Skin closure: 5-0 Proline  Number of sutures: 7  Technique: simple interrupted.   Patient tolerance: Patient tolerated the procedure well with no immediate complications.   LACERATION REPAIR Performed by: Hanley Hays Authorized by: Hanley Hays Consent: Verbal consent obtained. Risks and benefits: risks, benefits and alternatives were discussed Consent given by: patient Patient identity confirmed: provided demographic data Prepped and Draped in normal sterile fashion Wound explored  Laceration Location: center forehead  Laceration Length: 3 cm  No Foreign Bodies seen or palpated  Anesthesia: local infiltration  Local anesthetic: lidocaine 1% w/o epinephrine  Anesthetic total: 2 ml  Irrigation method: syringe Amount of cleaning: standard  Skin closure: 4-0 Proline  Number of sutures: 3  Technique: Simple interrupted.   Patient tolerance: Patient tolerated the procedure well with no immediate complications.  LACERATION REPAIR Performed by: Hanley Hays Authorized by: Hanley Hays Consent: Verbal consent obtained. Risks and benefits: risks, benefits and alternatives were  discussed Consent given by: patient Patient identity confirmed: provided demographic data Prepped and Draped in normal sterile fashion Wound explored  Laceration Location: posterior scalp   Laceration Length: 5 cm  No Foreign Bodies seen or palpated  Anesthesia: n/a    Anesthetic total: n/a  Irrigation method: syringe  Amount of cleaning: standard  Skin closure: staples   Number of staples: 4  Technique: Staples   Patient tolerance: Patient tolerated the procedure well with no immediate complications.   Waynetta Pean, PA-C 11/19/15 [place], MD 11/19/15 [phone]

## 2015-11-20 ENCOUNTER — Encounter (HOSPITAL_COMMUNITY): Payer: Self-pay | Admitting: Internal Medicine

## 2015-11-20 ENCOUNTER — Observation Stay (HOSPITAL_COMMUNITY): Payer: Commercial Managed Care - HMO

## 2015-11-20 ENCOUNTER — Ambulatory Visit (HOSPITAL_COMMUNITY): Payer: Self-pay

## 2015-11-20 DIAGNOSIS — S92424A Nondisplaced fracture of distal phalanx of right great toe, initial encounter for closed fracture: Secondary | ICD-10-CM | POA: Diagnosis not present

## 2015-11-20 DIAGNOSIS — R55 Syncope and collapse: Secondary | ICD-10-CM | POA: Diagnosis present

## 2015-11-20 DIAGNOSIS — S0101XA Laceration without foreign body of scalp, initial encounter: Secondary | ICD-10-CM | POA: Diagnosis not present

## 2015-11-20 DIAGNOSIS — S0181XA Laceration without foreign body of other part of head, initial encounter: Secondary | ICD-10-CM | POA: Diagnosis not present

## 2015-11-20 DIAGNOSIS — S0003XA Contusion of scalp, initial encounter: Secondary | ICD-10-CM | POA: Diagnosis not present

## 2015-11-20 LAB — TROPONIN I: Troponin I: <0.03 ng/mL (ref <0.031)

## 2015-11-20 LAB — COMPREHENSIVE METABOLIC PANEL
ALT: 14 U/L (ref 14–54)
ANION GAP: 5 (ref 5–15)
AST: 19 U/L (ref 15–41)
Albumin: 3.1 g/dL — ABNORMAL LOW (ref 3.5–5.0)
Alkaline Phosphatase: 47 U/L (ref 38–126)
BUN: 10 mg/dL (ref 6–20)
CHLORIDE: 110 mmol/L (ref 101–111)
CO2: 23 mmol/L (ref 22–32)
Calcium: 8 mg/dL — ABNORMAL LOW (ref 8.9–10.3)
Creatinine, Ser: 0.8 mg/dL (ref 0.44–1.00)
Glucose, Bld: 98 mg/dL (ref 65–99)
POTASSIUM: 4.1 mmol/L (ref 3.5–5.1)
SODIUM: 138 mmol/L (ref 135–145)
Total Bilirubin: 0.6 mg/dL (ref 0.3–1.2)
Total Protein: 5.4 g/dL — ABNORMAL LOW (ref 6.5–8.1)

## 2015-11-20 LAB — CBC WITH DIFFERENTIAL/PLATELET
Basophils Absolute: 0 10*3/uL (ref 0.0–0.1)
Basophils Relative: 0 %
EOS ABS: 0.1 10*3/uL (ref 0.0–0.7)
Eosinophils Relative: 2 %
HEMATOCRIT: 33.9 % — AB (ref 36.0–46.0)
HEMOGLOBIN: 11.1 g/dL — AB (ref 12.0–15.0)
LYMPHS ABS: 2.3 10*3/uL (ref 0.7–4.0)
LYMPHS PCT: 37 %
MCH: 32.2 pg (ref 26.0–34.0)
MCHC: 32.7 g/dL (ref 30.0–36.0)
MCV: 98.3 fL (ref 78.0–100.0)
Monocytes Absolute: 0.4 10*3/uL (ref 0.1–1.0)
Monocytes Relative: 7 %
NEUTROS ABS: 3.4 10*3/uL (ref 1.7–7.7)
NEUTROS PCT: 54 %
Platelets: 206 10*3/uL (ref 150–400)
RBC: 3.45 MIL/uL — AB (ref 3.87–5.11)
RDW: 13.6 % (ref 11.5–15.5)
WBC: 6.2 10*3/uL (ref 4.0–10.5)

## 2015-11-20 LAB — LACTIC ACID, PLASMA: LACTIC ACID, VENOUS: 1.2 mmol/L (ref 0.5–2.0)

## 2015-11-20 MED ORDER — LEVOTHYROXINE SODIUM 25 MCG PO TABS
25.0000 ug | ORAL_TABLET | Freq: Every day | ORAL | Status: DC
  Administered 2015-11-20 – 2015-11-22 (×3): 25 ug via ORAL
  Filled 2015-11-20 (×3): qty 1

## 2015-11-20 MED ORDER — LAMOTRIGINE 25 MG PO TABS
100.0000 mg | ORAL_TABLET | Freq: Every morning | ORAL | Status: DC
  Administered 2015-11-20 – 2015-11-22 (×3): 100 mg via ORAL
  Filled 2015-11-20 (×3): qty 4

## 2015-11-20 MED ORDER — ZOLPIDEM TARTRATE 5 MG PO TABS
5.0000 mg | ORAL_TABLET | Freq: Every evening | ORAL | Status: DC | PRN
  Administered 2015-11-20 (×2): 5 mg via ORAL
  Filled 2015-11-20 (×2): qty 1

## 2015-11-20 MED ORDER — ACETAMINOPHEN 325 MG PO TABS
650.0000 mg | ORAL_TABLET | Freq: Four times a day (QID) | ORAL | Status: DC | PRN
  Administered 2015-11-20 (×2): 650 mg via ORAL
  Filled 2015-11-20 (×2): qty 2

## 2015-11-20 MED ORDER — SODIUM CHLORIDE 0.9 % IV BOLUS (SEPSIS)
500.0000 mL | Freq: Once | INTRAVENOUS | Status: AC
  Administered 2015-11-20: 500 mL via INTRAVENOUS

## 2015-11-20 MED ORDER — FENTANYL CITRATE (PF) 100 MCG/2ML IJ SOLN
12.5000 ug | INTRAMUSCULAR | Status: DC | PRN
  Administered 2015-11-20 – 2015-11-22 (×12): 12.5 ug via INTRAVENOUS
  Filled 2015-11-20 (×12): qty 2

## 2015-11-20 MED ORDER — HYDROCODONE-ACETAMINOPHEN 5-325 MG PO TABS
1.0000 | ORAL_TABLET | Freq: Four times a day (QID) | ORAL | Status: DC | PRN
  Administered 2015-11-20 – 2015-11-22 (×9): 1 via ORAL
  Filled 2015-11-20 (×9): qty 1

## 2015-11-20 MED ORDER — ACETAMINOPHEN 650 MG RE SUPP: 650.0000 mg | Freq: Four times a day (QID) | RECTAL | Status: DC | PRN

## 2015-11-20 MED ORDER — ONDANSETRON HCL 4 MG/2ML IJ SOLN
4.0000 mg | Freq: Four times a day (QID) | INTRAMUSCULAR | Status: DC | PRN
  Filled 2015-11-20: qty 2

## 2015-11-20 MED ORDER — ONDANSETRON HCL 4 MG PO TABS
4.0000 mg | ORAL_TABLET | Freq: Four times a day (QID) | ORAL | Status: DC | PRN
  Administered 2015-11-20: 4 mg via ORAL

## 2015-11-20 MED ORDER — ONDANSETRON HCL 4 MG/2ML IJ SOLN
4.0000 mg | Freq: Four times a day (QID) | INTRAMUSCULAR | Status: DC | PRN
  Administered 2015-11-20: 4 mg via INTRAVENOUS
  Filled 2015-11-20 (×2): qty 2

## 2015-11-20 MED ORDER — ALPRAZOLAM 0.25 MG PO TABS
0.2500 mg | ORAL_TABLET | Freq: Two times a day (BID) | ORAL | Status: AC | PRN
  Administered 2015-11-20 (×2): 0.25 mg via ORAL
  Filled 2015-11-20 (×2): qty 1

## 2015-11-20 MED ORDER — DULOXETINE HCL 30 MG PO CPEP
30.0000 mg | ORAL_CAPSULE | Freq: Two times a day (BID) | ORAL | Status: DC
  Administered 2015-11-20 (×3): 30 mg via ORAL
  Filled 2015-11-20 (×4): qty 1

## 2015-11-20 MED ORDER — TRIMETHOPRIM 100 MG PO TABS
100.0000 mg | ORAL_TABLET | Freq: Every day | ORAL | Status: DC
  Filled 2015-11-20: qty 1

## 2015-11-20 MED ORDER — ONDANSETRON HCL 4 MG PO TABS
4.0000 mg | ORAL_TABLET | ORAL | Status: DC | PRN
  Administered 2015-11-20 – 2015-11-22 (×5): 4 mg via ORAL
  Filled 2015-11-20 (×5): qty 1

## 2015-11-20 MED ORDER — SODIUM CHLORIDE 0.9 % IV SOLN
INTRAVENOUS | Status: AC
  Administered 2015-11-20 (×2): via INTRAVENOUS
  Administered 2015-11-20: 1000 mL via INTRAVENOUS

## 2015-11-20 NOTE — Progress Notes (Signed)
Pt seen and examined, admitted earlier this am by Dr.K 49/F with bipolar disorder, anxiety, admitted after  3-4 episodes of Syncope yesterday, no prodrome, some palpitations, denies ETOH, ETOH level 28, reports using some cough medicine recently H/o Bipolar d/o, She stopped lamictal and cymbalta 2-3 days ago No seizure activity, history not suggestive of this,  last seizure was 1year ago, however given changes in meds use will check EEG. -2D ECHO pending, EKG borderline prolong QTC -Will ask Cards to eval, keep on Tele -Pain/discoloration in big toe, will check Xray, may be developing gout flare,  -Bruise in R arm too after syncope  Domenic Polite MD [phone]
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
## 2015-11-20 NOTE — ED Notes (Signed)
Admitting MD at bedside.

## 2015-11-20 NOTE — Consult Note (Addendum)
ELECTROPHYSIOLOGY CONSULT NOTE    Patient ID: [name] MRN: [id number], DOB/AGE: 06/27/1966 50 y.o.  Admit date: 11/19/2015 Date of Consult: 11/20/2015  Primary Physician: Antony Blackbird, MD Primary Cardiologist: new to [place]  Reason for Consultation: syncope/prolonged QT  HPI:  [name] is a 50 y.o. female with a past medical history significant for seizures, depression, anxiety, and DDD.  She reports that she has not had a seizure in over a year prior to admission.  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 She discontinued her Lamictal and Cymbalta 2 days prior to admission.  On the day of admission, she was in her usual state of health. She was in the shower around 5:30PM (normally showers at night).  She remembers having palpitations and then waking up on the shower floor.  She then tried to get up and again passed out. She then made it out of the shower into the bathroom where she passed out again and hit her head. Her husband then came into the bathroom and reports that she had another episode immediately after standing where her body went "rigid" and she fell backwards.  She tried to get up twice more after that and was unable to without again losing consciousness. Her husband picked her up and carried her to the bed where he called EMS. After a couple of minutes, she regained consciousness and was no longer rigid. On EMS arrival they documented hypotension but no arrhythmias.  Orthostatics this morning were slightly positive. She has had no arrhythmias on telemetry. EP has been asked to evaluate for treatment options.   Lab work on admission is notable for elevated ETOH level, UDS +marijuana, negative troponin, normal electrolytes. Echo pending. EEG pending.   She has not had prior syncope before these episodes. She does have occasional palpitations that correlate with anxiety attacks and are not associated with dizziness or  pre-syncope. She has not had recent chest pain, shortness of breath, recent fevers, chills, nausea or vomiting.  There is no family history of sudden death, crib death, drownings.   Past Medical History  Diagnosis Date  . Depression   . DDD (degenerative disc disease)   . Pertussis   . Seizures ([place])   . Anxiety   . Bipolar disorder [facility])      Surgical History:  Past Surgical History  Procedure Laterality Date  . Cervical spine surgery    . Cesarean section    . Tubal ligation    . Tonsillectomy       Prescriptions prior to admission  Medication Sig Dispense Refill Last Dose  . acetaminophen (TYLENOL) 500 MG tablet Take 500 mg by mouth every 6 (six) hours as needed for mild pain.   Past Week at Unknown time  . Aspirin-Salicylamide-Caffeine (BC HEADACHE POWDER PO) Take by mouth.   11/18/2015 at Unknown time  . DULoxetine (CYMBALTA) 60 MG capsule Take 1 capsule (60 mg total) by mouth 2 (two) times daily. (Patient taking differently: Take 30 mg by mouth 2 (two) times daily. ) 60 capsule 0 Past Week at Unknown time  . furosemide (LASIX) 20 MG tablet Take 1 tablet (20 mg total) by mouth daily as needed for fluid. 30 tablet 0 Past Week at Unknown time  . lamoTRIgine (LAMICTAL) 100 MG tablet Take 100 mg by mouth every morning.   Past Week at Unknown time  . levothyroxine (SYNTHROID, LEVOTHROID) 25 MCG tablet Take 1 tablet (25 mcg total)  by mouth daily before breakfast.   11/19/2015 at Unknown time  . trimethoprim (TRIMPEX) 100 MG tablet Take 1 tablet (100 mg total) by mouth daily.   11/18/2015 at Unknown time  . diazepam (VALIUM) 5 MG tablet Take 1 tablet (5 mg total) by mouth every 6 (six) hours as needed for muscle spasms. (Patient not taking: Reported on 10/14/2015) 15 tablet 0 Not Taking at Unknown time  . HYDROcodone-acetaminophen (NORCO/VICODIN) 5-325 MG tablet Take 1 tablet by mouth every 6 (six) hours as needed for moderate pain. 12 tablet 0   . hydrOXYzine (ATARAX/VISTARIL) 50 MG  tablet Take 1 tablet (50 mg total) by mouth every 6 (six) hours as needed for anxiety. (Patient not taking: Reported on 10/14/2015) 30 tablet 0 Not Taking at Unknown time  . ibuprofen (ADVIL,MOTRIN) 600 MG tablet Take 1 tablet (600 mg total) by mouth every 6 (six) hours as needed. (Patient not taking: Reported on 10/14/2015) 30 tablet 0 Not Taking at Unknown time  . levETIRAcetam (KEPPRA) 500 MG tablet Take 1 tablet (500 mg total) by mouth 2 (two) times daily. (Patient not taking: Reported on 10/14/2015) 60 tablet 0 Not Taking at Unknown time  . omeprazole (PRILOSEC) 20 MG capsule Take 1 capsule (20 mg total) by mouth daily as needed (heartburn). (Patient not taking: Reported on 10/14/2015)   Not Taking at Unknown time  . potassium chloride (K-DUR) 10 MEQ tablet Take 1 tablet (10 mEq total) by mouth daily as needed (with furosemide). (Patient not taking: Reported on 11/19/2015)   Not Taking at Unknown time  . prazosin (MINIPRESS) 1 MG capsule Take 1 capsule (1 mg total) by mouth at bedtime. (Patient not taking: Reported on 10/14/2015) 30 capsule 0 Not Taking at Unknown time  . predniSONE (DELTASONE) 20 MG tablet Take 3 tablets (60 mg total) by mouth daily. (Patient not taking: Reported on 10/14/2015) 12 tablet 0 Not Taking at Unknown time  . pregabalin (LYRICA) 50 MG capsule Take 1 capsule (50 mg total) by mouth 3 (three) times daily. (Patient not taking: Reported on 10/14/2015) 90 capsule 0 Not Taking at Unknown time  . traZODone (DESYREL) 100 MG tablet Take 1 tablet (100 mg total) by mouth at bedtime as needed for sleep. (Patient not taking: Reported on 11/19/2015) 30 tablet 0 Not Taking at Unknown time    Inpatient Medications:  . DULoxetine  30 mg Oral BID  . lamoTRIgine  100 mg Oral q morning - 10a  . levothyroxine  25 mcg Oral QAC breakfast    Allergies:  Allergies  Allergen Reactions  . Naproxen Anaphylaxis    Social History   Social History  . Marital Status: Significant Other     Spouse Name: N/A  . Number of Children: N/A  . Years of Education: N/A   Occupational History  . Not on file.   Social History Main Topics  . Smoking status: Never Smoker   . Smokeless tobacco: Not on file  . Alcohol Use: No     Comment: quit drinking 7 months  . Drug Use: No     Comment: last used in 1990's  . Sexual Activity: Yes   Other Topics Concern  . Not on file   Social History Narrative     Family History  Problem Relation Age of Onset  . Diabetes Mellitus II Neg Hx   . CAD Neg Hx      Review of Systems: All other systems reviewed and are otherwise negative except as noted above.  Physical Exam: Filed Vitals:   11/20/15 0113 11/20/15 0500 11/20/15 1013 11/20/15 1054  BP: 99/51 104/53 99/38   Pulse: 81 94 86   Temp: 98.4 F (36.9 C) 98.6 F (37 C)    TempSrc: Oral Oral    Resp: 13 18 16 16   Height: 5\' 1"  (1.549 m)     Weight: 130 lb 15.3 oz (59.4 kg)     SpO2: 99% 98%  98%    GEN- The patient is chronically ill appearing, alert and oriented x 3 today.   HEENT: normocephalic, +bandage to forehead; sclera clear, conjunctiva pink; hearing intact; oropharynx clear; neck supple  Lungs- Clear to ausculation bilaterally, normal work of breathing.  No wheezes, rales, rhonchi Heart- Regular rate and rhythm  GI- soft, non-tender, non-distended, bowel sounds present  Extremities- no clubbing, cyanosis, or edema; DP/PT/radial pulses 2+ bilaterally MS- no significant deformity or atrophy Skin- warm and dry, no rash or lesion Psych- euthymic mood, full affect Neuro- strength and sensation are intact  Labs:   Lab Results  Component Value Date   WBC 6.2 11/20/2015   HGB 11.1* 11/20/2015   HCT 33.9* 11/20/2015   MCV 98.3 11/20/2015   PLT 206 11/20/2015     Recent Labs Lab 11/20/15 1000  NA 138  K 4.1  CL 110  CO2 23  BUN 10  CREATININE 0.80  CALCIUM 8.0*  PROT 5.4*  BILITOT 0.6  ALKPHOS 47  ALT 14  AST 19  GLUCOSE 98        Radiology/Studies: Dg Chest 2 View 11/19/2015  CLINICAL DATA:  Acute syncope and fall. EXAM: CHEST  2 VIEW COMPARISON:  10/14/2015 and prior exams FINDINGS: The cardiomediastinal silhouette is unremarkable. There is no evidence of focal airspace disease, pulmonary edema, suspicious pulmonary nodule/mass, pleural effusion, or pneumothorax. No acute bony abnormalities are identified. Cervical spine surgical hardware again noted. IMPRESSION: No active cardiopulmonary disease. Electronically Signed   By: Margarette Canada M.D.   On: 11/19/2015 22:02   Ct Head Wo Contrast 11/19/2015  CLINICAL DATA:  Patient fell in the bathroom today. Struck back of head. Laceration to the forehead. Passed out. Headache. EXAM: CT HEAD WITHOUT CONTRAST CT CERVICAL SPINE WITHOUT CONTRAST TECHNIQUE: Multidetector CT imaging of the head and cervical spine was performed following the standard protocol without intravenous contrast. Multiplanar CT image reconstructions of the cervical spine were also generated. COMPARISON:  10/14/2015 FINDINGS: CT HEAD FINDINGS Ventricles and sulci appear symmetrical. No ventricular dilatation. No mass effect or midline shift. No abnormal extra-axial fluid collections. Gray-white matter junctions are distinct. Basal cisterns are not effaced. No evidence of acute intracranial hemorrhage. No depressed skull fractures. Visualized paranasal sinuses and mastoid air cells are not opacified. Subcutaneous scalp hematoma and subcutaneous gas over the right anterior frontal region and posterior vertex consistent with lacerations and hematomas. CT CERVICAL SPINE FINDINGS Postoperative changes with anterior plate and screw fixation and intervertebral fusion from C4 through C7. Few segments appear intact with bony fusion demonstrated. Hardware components appear intact without fracture or dislocation. Degenerative changes at C2-3, C3-4, and C7-T1. Normal alignment of the cervical spine. No prevertebral soft tissue swelling. No  focal bone lesion or bone destruction. C1-2 articulation appears intact. Soft tissues are unremarkable. IMPRESSION: No acute intracranial abnormalities. Soft tissue lacerations with subcutaneous scalp hematomas over the right anterior frontal region and posterior vertex. Postoperative changes with anterior plate and screw fixation and intervertebral fusion from C4 through C7. Degenerative changes in the cervical spine. No acute displaced  fractures identified. Electronically Signed   By: Lucienne Capers M.D.   On: 11/19/2015 22:18   Ct Cervical Spine Wo Contrast 11/19/2015  CLINICAL DATA:  Patient fell in the bathroom today. Struck back of head. Laceration to the forehead. Passed out. Headache. EXAM: CT HEAD WITHOUT CONTRAST CT CERVICAL SPINE WITHOUT CONTRAST TECHNIQUE: Multidetector CT imaging of the head and cervical spine was performed following the standard protocol without intravenous contrast. Multiplanar CT image reconstructions of the cervical spine were also generated. COMPARISON:  10/14/2015 FINDINGS: CT HEAD FINDINGS Ventricles and sulci appear symmetrical. No ventricular dilatation. No mass effect or midline shift. No abnormal extra-axial fluid collections. Gray-white matter junctions are distinct. Basal cisterns are not effaced. No evidence of acute intracranial hemorrhage. No depressed skull fractures. Visualized paranasal sinuses and mastoid air cells are not opacified. Subcutaneous scalp hematoma and subcutaneous gas over the right anterior frontal region and posterior vertex consistent with lacerations and hematomas. CT CERVICAL SPINE FINDINGS Postoperative changes with anterior plate and screw fixation and intervertebral fusion from C4 through C7. Few segments appear intact with bony fusion demonstrated. Hardware components appear intact without fracture or dislocation. Degenerative changes at C2-3, C3-4, and C7-T1. Normal alignment of the cervical spine. No prevertebral soft tissue swelling. No  focal bone lesion or bone destruction. C1-2 articulation appears intact. Soft tissues are unremarkable. IMPRESSION: No acute intracranial abnormalities. Soft tissue lacerations with subcutaneous scalp hematomas over the right anterior frontal region and posterior vertex. Postoperative changes with anterior plate and screw fixation and intervertebral fusion from C4 through C7. Degenerative changes in the cervical spine. No acute displaced fractures identified. Electronically Signed   By: Lucienne Capers M.D.   On: 11/19/2015 22:18   EKG: sinus rhythm, rate 84, QTc 493  TELEMETRY: sinus rhythm  Assessment/Plan: 1.  Syncope The patient's syncope is most consistent with either vasovagal episode or seizure.  Her husband reports that this episode was clearly different from her prior seizures.  I do not think her spell was arrhythmic.  Agree with echo, EEG.  Recommend adequate hydration  2.  Palpitations Not associated with dizziness or syncope in the past. She feels that these are mostly related to anxiety Could consider event monitor  3.  Prolonged QT interval The patient's QT interval is prolonged at 460-416msec consistently for the past few years.  She has not had ventricular arrhythmias documented on telemetry.  No prior syncope, family history of SCD.  Recommend keeping K >3.9, Mg >1.8 Avoid QT prolonging drugs With palpitations, could consider adding low dose BB - will discuss with Dr Caryl Comes   Signed, Chanetta Marshall, NP 11/20/2015 2:37 PM  Pt interviewed and examined.    The patient has a complex symptom profile. Long-standing palpitations while described as abrupt in August onset as well as offset and associated with lightheadedness apparently have occurred while in hospital and telemetry review reveals no tachycardia. I've asked her to be specific about the time when these occur so that we can correlate with the telemetry recording. We may find nothing.  Her syncope sounds as though  neurally mediated. It occurred in the context of marijuana exposure which isn't known basal active agent, it was recurring orthostatic i.e. occurring with standing and repetitive. Furthermore, she was found to be hypotensive on arrival of EMS while she was recumbent.    Her QT interval is long. She is on medications which can be affecting this i.e. her Cymbalta. I agree it is important to try to make sure that she  is not put on other IKR blockers although there is no reason to think that she has long QT syndrome at this point.  Is intriguing however that she has a history of seizures and apparently has never had a positive EEG. This raises the specter identified years ago that long QT syndrome can present as drug refractory seizures. It is regarded as normal. She has not had seizures in a year and that occurred when she abruptly stopped taking her benzodiazepines.  She has   an echo pending. We wil review this prior to discharge

## 2015-11-20 NOTE — H&P (Signed)
[facility] History and Physical  [name] [id number] DOB: May 28, 1966 DOA: 11/19/2015  Referring physician: Dr. Rogene Houston. PCP: Antony Blackbird, MD  Specialists: None.  Chief Complaint: Loss of consciousness.  HPI: [name] is a 50 y.o. female with history of bipolar disorder was brought to the ER after patient had recurrent episodes of syncope. Patient's first episode happened around 5:30 PM while patient was taking baths. Patient states she suddenly fell loss consciousness prior to which patient had some palpitation. After getting up from the fall patient again had another episode which was witnessed by patient's husband. Patient had 2 more episodes after that not able to clearly tell how long it lasted. The patient states she has not had any incontinence of urine or any tongue bite noted patient's husband did not notice any seizure-like activity. 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 In the ER patient was found to have laceration of the frontal and posterior scalp which was sutured. CT of the head did not show anything acute. EKG shows normal sinus rhythm with a second EKG showing mildly prolonged QT interval and patient states she has had a history of QT interval prolongation. Patient's blood pressure also was in the low normal. Alcohol levels were positive but patient states that she only drinks once or twice a week.  Review of Systems: As presented in the history of presenting illness, rest negative.  Past Medical History  Diagnosis Date  . Depression   . DDD (degenerative disc disease)   . DDD (degenerative disc disease)   . DDD (degenerative disc disease)   . Pertussis   . Seizures ([place])   . Anxiety    Past Surgical History  Procedure Laterality Date  . Cervical spine surgery    . Cesarean section    . Tubal ligation    . Tonsillectomy     Social History:  reports that she has never smoked. She does not  have any smokeless tobacco history on file. She reports that she does not drink alcohol or use illicit drugs. Where does patient live home. Can patient participate in ADLs? Yes.  Allergies  Allergen Reactions  . Naproxen Anaphylaxis    Family History:  Family History  Problem Relation Age of Onset  . Diabetes Mellitus II Neg Hx   . CAD Neg Hx       Prior to Admission medications   Medication Sig Start Date End Date Taking? Authorizing Provider  acetaminophen (TYLENOL) 500 MG tablet Take 500 mg by mouth every 6 (six) hours as needed for mild pain.   Yes Historical Provider, MD  Aspirin-Salicylamide-Caffeine (BC HEADACHE POWDER PO) Take by mouth.   Yes Historical Provider, MD  DULoxetine (CYMBALTA) 60 MG capsule Take 1 capsule (60 mg total) by mouth 2 (two) times daily. Patient taking differently: Take 30 mg by mouth 2 (two) times daily.  03/28/15  Yes Kerrie Buffalo, NP  furosemide (LASIX) 20 MG tablet Take 1 tablet (20 mg total) by mouth daily as needed for fluid. 03/28/15  Yes Kerrie Buffalo, NP  lamoTRIgine (LAMICTAL) 100 MG tablet Take 100 mg by mouth every morning. 08/02/15  Yes Historical Provider, MD  levothyroxine (SYNTHROID, LEVOTHROID) 25 MCG tablet Take 1 tablet (25 mcg total) by mouth daily before breakfast. 03/28/15  Yes Kerrie Buffalo, NP  trimethoprim (TRIMPEX) 100 MG tablet Take 1 tablet (100 mg total) by mouth daily. 03/28/15  Yes Kerrie Buffalo, NP  diazepam (VALIUM) 5 MG tablet Take  1 tablet (5 mg total) by mouth every 6 (six) hours as needed for muscle spasms. Patient not taking: Reported on 10/14/2015 09/17/15   Harvel Quale, MD  HYDROcodone-acetaminophen (NORCO/VICODIN) 5-325 MG tablet Take 1 tablet by mouth every 6 (six) hours as needed for moderate pain. 10/14/15   Esaw Grandchild, MD  hydrOXYzine (ATARAX/VISTARIL) 50 MG tablet Take 1 tablet (50 mg total) by mouth every 6 (six) hours as needed for anxiety. Patient not taking: Reported on 10/14/2015 03/28/15   Kerrie Buffalo, NP  ibuprofen (ADVIL,MOTRIN) 600 MG tablet Take 1 tablet (600 mg total) by mouth every 6 (six) hours as needed. Patient not taking: Reported on 10/14/2015 09/17/15   Harvel Quale, MD  levETIRAcetam (KEPPRA) 500 MG tablet Take 1 tablet (500 mg total) by mouth 2 (two) times daily. Patient not taking: Reported on 10/14/2015 03/28/15   Kerrie Buffalo, NP  omeprazole (PRILOSEC) 20 MG capsule Take 1 capsule (20 mg total) by mouth daily as needed (heartburn). Patient not taking: Reported on 10/14/2015 03/28/15   Kerrie Buffalo, NP  potassium chloride (K-DUR) 10 MEQ tablet Take 1 tablet (10 mEq total) by mouth daily as needed (with furosemide). Patient not taking: Reported on 11/19/2015 03/28/15   Kerrie Buffalo, NP  prazosin (MINIPRESS) 1 MG capsule Take 1 capsule (1 mg total) by mouth at bedtime. Patient not taking: Reported on 10/14/2015 03/28/15   Kerrie Buffalo, NP  predniSONE (DELTASONE) 20 MG tablet Take 3 tablets (60 mg total) by mouth daily. Patient not taking: Reported on 10/14/2015 05/26/15   Debby Freiberg, MD  pregabalin (LYRICA) 50 MG capsule Take 1 capsule (50 mg total) by mouth 3 (three) times daily. Patient not taking: Reported on 10/14/2015 03/28/15   Kerrie Buffalo, NP  traZODone (DESYREL) 100 MG tablet Take 1 tablet (100 mg total) by mouth at bedtime as needed for sleep. Patient not taking: Reported on 11/19/2015 03/28/15   Kerrie Buffalo, NP    Physical Exam: Filed Vitals:   11/19/15 2315 11/20/15 0000 11/20/15 0030 11/20/15 0113  BP: 116/69 106/74  99/51  Pulse: 90 89 83 81  Temp:    98.4 F (36.9 C)  TempSrc:    Oral  Resp: 15 15 10 13   Height:    5\' 1"  (1.549 m)  Weight:    59.4 kg (130 lb 15.3 oz)  SpO2: 98% 99% 99% 99%     General:  Moderately built and nourished.  Eyes: Anicteric no pallor.  ENT: No discharge from the ears eyes nose normal. Scalp laceration which has been sutured.  Neck: No mass felt. No neck rigidity.  Cardiovascular: S1 and S2  heard.  Respiratory: No rhonchi or crepitations.  Abdomen: Soft nontender bowel sounds present.  Skin: Scalp laceration.  Musculoskeletal: No edema.  Psychiatric: Appears normal.  Neurologic: Alert awake oriented to time place and person. Moves all extremities.  Labs on Admission:  Basic Metabolic Panel:  Recent Labs Lab 11/19/15 1934  NA 144  K 4.6  CL 105  CO2 26  GLUCOSE 97  BUN 13  CREATININE 0.96  CALCIUM 9.2   Liver Function Tests:  Recent Labs Lab 11/19/15 1934  AST 22  ALT 17  ALKPHOS 56  BILITOT 0.2*  PROT 6.5  ALBUMIN 3.9    Recent Labs Lab 11/19/15 1934  LIPASE 36   No results for input(s): AMMONIA in the last 168 hours. CBC:  Recent Labs Lab 11/19/15 1934  WBC 7.4  HGB 13.3  HCT 39.3  MCV  97.5  PLT 262   Cardiac Enzymes: No results for input(s): CKTOTAL, CKMB, CKMBINDEX, TROPONINI in the last 168 hours.  BNP (last 3 results) No results for input(s): BNP in the last 8760 hours.  ProBNP (last 3 results) No results for input(s): PROBNP in the last 8760 hours.  CBG:  Recent Labs Lab 11/19/15 1932  GLUCAP 87    Radiological Exams on Admission: Dg Chest 2 View  11/19/2015  CLINICAL DATA:  Acute syncope and fall. EXAM: CHEST  2 VIEW COMPARISON:  10/14/2015 and prior exams FINDINGS: The cardiomediastinal silhouette is unremarkable. There is no evidence of focal airspace disease, pulmonary edema, suspicious pulmonary nodule/mass, pleural effusion, or pneumothorax. No acute bony abnormalities are identified. Cervical spine surgical hardware again noted. IMPRESSION: No active cardiopulmonary disease. Electronically Signed   By: Margarette Canada M.D.   On: 11/19/2015 22:02   Ct Head Wo Contrast  11/19/2015  CLINICAL DATA:  Patient fell in the bathroom today. Struck back of head. Laceration to the forehead. Passed out. Headache. EXAM: CT HEAD WITHOUT CONTRAST CT CERVICAL SPINE WITHOUT CONTRAST TECHNIQUE: Multidetector CT imaging of the head  and cervical spine was performed following the standard protocol without intravenous contrast. Multiplanar CT image reconstructions of the cervical spine were also generated. COMPARISON:  10/14/2015 FINDINGS: CT HEAD FINDINGS Ventricles and sulci appear symmetrical. No ventricular dilatation. No mass effect or midline shift. No abnormal extra-axial fluid collections. Gray-white matter junctions are distinct. Basal cisterns are not effaced. No evidence of acute intracranial hemorrhage. No depressed skull fractures. Visualized paranasal sinuses and mastoid air cells are not opacified. Subcutaneous scalp hematoma and subcutaneous gas over the right anterior frontal region and posterior vertex consistent with lacerations and hematomas. CT CERVICAL SPINE FINDINGS Postoperative changes with anterior plate and screw fixation and intervertebral fusion from C4 through C7. Few segments appear intact with bony fusion demonstrated. Hardware components appear intact without fracture or dislocation. Degenerative changes at C2-3, C3-4, and C7-T1. Normal alignment of the cervical spine. No prevertebral soft tissue swelling. No focal bone lesion or bone destruction. C1-2 articulation appears intact. Soft tissues are unremarkable. IMPRESSION: No acute intracranial abnormalities. Soft tissue lacerations with subcutaneous scalp hematomas over the right anterior frontal region and posterior vertex. Postoperative changes with anterior plate and screw fixation and intervertebral fusion from C4 through C7. Degenerative changes in the cervical spine. No acute displaced fractures identified. Electronically Signed   By: Lucienne Capers M.D.   On: 11/19/2015 22:18   Ct Cervical Spine Wo Contrast  11/19/2015  CLINICAL DATA:  Patient fell in the bathroom today. Struck back of head. Laceration to the forehead. Passed out. Headache. EXAM: CT HEAD WITHOUT CONTRAST CT CERVICAL SPINE WITHOUT CONTRAST TECHNIQUE: Multidetector CT imaging of the  head and cervical spine was performed following the standard protocol without intravenous contrast. Multiplanar CT image reconstructions of the cervical spine were also generated. COMPARISON:  10/14/2015 FINDINGS: CT HEAD FINDINGS Ventricles and sulci appear symmetrical. No ventricular dilatation. No mass effect or midline shift. No abnormal extra-axial fluid collections. Gray-white matter junctions are distinct. Basal cisterns are not effaced. No evidence of acute intracranial hemorrhage. No depressed skull fractures. Visualized paranasal sinuses and mastoid air cells are not opacified. Subcutaneous scalp hematoma and subcutaneous gas over the right anterior frontal region and posterior vertex consistent with lacerations and hematomas. CT CERVICAL SPINE FINDINGS Postoperative changes with anterior plate and screw fixation and intervertebral fusion from C4 through C7. Few segments appear intact with bony fusion demonstrated. Hardware components appear  intact without fracture or dislocation. Degenerative changes at C2-3, C3-4, and C7-T1. Normal alignment of the cervical spine. No prevertebral soft tissue swelling. No focal bone lesion or bone destruction. C1-2 articulation appears intact. Soft tissues are unremarkable. IMPRESSION: No acute intracranial abnormalities. Soft tissue lacerations with subcutaneous scalp hematomas over the right anterior frontal region and posterior vertex. Postoperative changes with anterior plate and screw fixation and intervertebral fusion from C4 through C7. Degenerative changes in the cervical spine. No acute displaced fractures identified. Electronically Signed   By: Lucienne Capers M.D.   On: 11/19/2015 22:18    EKG: Independently reviewed. Normal sinus rhythm with QTC of 493 ms.  Assessment/Plan Principal Problem:   Syncope Active Problems:   BIPOLAR AFFECTIVE DISORDER   Scalp laceration   1. Syncope - patient was clearly hypotensive for which I have ordered fluids  along with bolus. Patient's QTc interval is mildly prolonged and patient states that she has had previous history of QTC prolongation. Closely observe in telemetry check 2-D echo and will probably need EP cardiology consult. Check orthostatics in a.m. 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 Presently does screen is positive for marijuana. Check EEG. 2. Bipolar disorder - patient has not been taking her Lamictal last 3 days. Continue home medications. 3. Scalp laceration which has been sutured.  Patient's upper levels of positive but patient states she does not drink alcohol everyday.   DVT Prophylaxis SCDs.  Code Status: Full code.  Family Communication: Discussed with patient.  Disposition Plan: Admit for observation.    Ceazia Harb N. [facility] Pager [phone].  If 7PM-7AM, please contact night-coverage [email or web address] Password [email or web address] 11/20/2015, 1:33 AM

## 2015-11-21 ENCOUNTER — Observation Stay (HOSPITAL_BASED_OUTPATIENT_CLINIC_OR_DEPARTMENT_OTHER): Payer: Commercial Managed Care - HMO

## 2015-11-21 ENCOUNTER — Observation Stay (HOSPITAL_COMMUNITY): Payer: Commercial Managed Care - HMO

## 2015-11-21 DIAGNOSIS — R55 Syncope and collapse: Secondary | ICD-10-CM

## 2015-11-21 DIAGNOSIS — F319 Bipolar disorder, unspecified: Secondary | ICD-10-CM | POA: Diagnosis not present

## 2015-11-21 MED ORDER — DULOXETINE HCL 30 MG PO CPEP
30.0000 mg | ORAL_CAPSULE | Freq: Every day | ORAL | Status: DC
  Administered 2015-11-22: 30 mg via ORAL
  Filled 2015-11-21: qty 1

## 2015-11-21 MED ORDER — ALPRAZOLAM 0.5 MG PO TABS
0.5000 mg | ORAL_TABLET | Freq: Two times a day (BID) | ORAL | Status: DC | PRN
  Administered 2015-11-21 – 2015-11-22 (×2): 0.5 mg via ORAL
  Filled 2015-11-21 (×2): qty 1

## 2015-11-21 NOTE — Progress Notes (Signed)
Orthopedic Tech Progress Note Patient Details:  [name] 04/05/1966 [id number]  Ortho Devices Type of Ortho Device: Ace wrap, Post (short leg) splint Ortho Device/Splint Location: rle Ortho Device/Splint Interventions: Application   Robi Dewolfe 11/21/2015, 3:18 PM

## 2015-11-21 NOTE — Progress Notes (Signed)
EEG completed, results pending. 

## 2015-11-21 NOTE — Procedures (Signed)
History: 50 yo F with loss of conciousnessnon  Sedation: none  Technique: This is a 21 channel routine scalp EEG performed at the bedside with bipolar and monopolar montages arranged in accordance to the international 10/20 system of electrode placement. One channel was dedicated to EKG recording.    Background: The background consists of intermixed alpha and beta activities. There is a well defined posterior dominant rhythm of 11 Hz that attenuates with eye opening. Sleep is recorded with normal appearing structures.   Photic stimulation: Physiologic driving is not performed  EEG Abnormalities: None  Clinical Interpretation: This normal EEG is recorded in the waking and sleep state. There was no seizure or seizure predisposition recorded on this study. Please note that a normal EEG does not preclude the possibility of epilepsy.   Roland Rack, MD [facility] [phone]  If 7pm- 7am, please page neurology on call as listed in [place].

## 2015-11-21 NOTE — Progress Notes (Signed)
[facility] PROGRESS NOTE  [name] [id number] DOB: 1966-09-19 DOA: 11/19/2015 PCP: Antony Blackbird, MD  Assessment/Plan: Syncope/multiple episodes -possibly vasovagal -appreciate Cards input -ECHO pending, no events on tele -QTC slightly prolonged, will cut down cymbalta to 30mg  and taper off -EEG normal  QTC prolonged -could be due to psychotropic meds, cut down Cymbalta and taper off in 1 week  H/o Bipolar d/o -back on lamictal, taper off cymbalta  -start celexa, will review side effect profile of this med  Fracture -distal phalanx of great toe -will need to be Immobilized, will d/w ortho tech  DVT proph: lovenox  Code Status: Full Code Family Communication: none at bedside Disposition Plan: home tomorrow if stable   Consultants: Cards  HPI/Subjective: Feels better, some nausea  Objective: Filed Vitals:   11/20/15 2138 11/21/15 0455  BP: 115/55 111/62  Pulse: 88 73  Temp: 98.4 F (36.9 C) 97.7 F (36.5 C)  Resp: 16     Intake/Output Summary (Last 24 hours) at 11/21/15 1359 Last data filed at 11/21/15 0900  Gross per 24 hour  Intake   2121 ml  Output      0 ml  Net   2121 ml   Filed Weights   11/19/15 1939 11/20/15 0113 11/21/15 0508  Weight: 54.432 kg (120 lb) 59.4 kg (130 lb 15.3 oz) 60.5 kg (133 lb 6.1 oz)    Exam:   General: AAOx3  Cardiovascular: S1S2/RRR  Respiratory:CTAB  Abdomen: soft, NT, BS present  Musculoskeletal: no edema c/c  Data Reviewed: Basic Metabolic Panel:  Recent Labs Lab 11/19/15 1934 11/20/15 1000  NA 144 138  K 4.6 4.1  CL 105 110  CO2 26 23  GLUCOSE 97 98  BUN 13 10  CREATININE 0.96 0.80  CALCIUM 9.2 8.0*   Liver Function Tests:  Recent Labs Lab 11/19/15 1934 11/20/15 1000  AST 22 19  ALT 17 14  ALKPHOS 56 47  BILITOT 0.2* 0.6  PROT 6.5 5.4*  ALBUMIN 3.9 3.1*    Recent Labs Lab 11/19/15 1934  LIPASE 36   No results for input(s): AMMONIA in the last 168  hours. CBC:  Recent Labs Lab 11/19/15 1934 11/20/15 1000  WBC 7.4 6.2  NEUTROABS  --  3.4  HGB 13.3 11.1*  HCT 39.3 33.9*  MCV 97.5 98.3  PLT 262 206   Cardiac Enzymes:  Recent Labs Lab 11/20/15 0605 11/20/15 1000 11/20/15 1700  TROPONINI <0.03 <0.03 <0.03   BNP (last 3 results) No results for input(s): BNP in the last 8760 hours.  ProBNP (last 3 results) No results for input(s): PROBNP in the last 8760 hours.  CBG:  Recent Labs Lab 11/19/15 1932  GLUCAP 87    No results found for this or any previous visit (from the past 240 hour(s)).   Studies: Dg Chest 2 View  11/19/2015  CLINICAL DATA:  Acute syncope and fall. EXAM: CHEST  2 VIEW COMPARISON:  10/14/2015 and prior exams FINDINGS: The cardiomediastinal silhouette is unremarkable. There is no evidence of focal airspace disease, pulmonary edema, suspicious pulmonary nodule/mass, pleural effusion, or pneumothorax. No acute bony abnormalities are identified. Cervical spine surgical hardware again noted. IMPRESSION: No active cardiopulmonary disease. Electronically Signed   By: Margarette Canada M.D.   On: 11/19/2015 22:02   Ct Head Wo Contrast  11/19/2015  CLINICAL DATA:  Patient fell in the bathroom today. Struck back of head. Laceration to the forehead. Passed out. Headache. EXAM: CT HEAD WITHOUT CONTRAST CT CERVICAL SPINE WITHOUT  CONTRAST TECHNIQUE: Multidetector CT imaging of the head and cervical spine was performed following the standard protocol without intravenous contrast. Multiplanar CT image reconstructions of the cervical spine were also generated. COMPARISON:  10/14/2015 FINDINGS: CT HEAD FINDINGS Ventricles and sulci appear symmetrical. No ventricular dilatation. No mass effect or midline shift. No abnormal extra-axial fluid collections. Gray-white matter junctions are distinct. Basal cisterns are not effaced. No evidence of acute intracranial hemorrhage. No depressed skull fractures. Visualized paranasal sinuses and  mastoid air cells are not opacified. Subcutaneous scalp hematoma and subcutaneous gas over the right anterior frontal region and posterior vertex consistent with lacerations and hematomas. CT CERVICAL SPINE FINDINGS Postoperative changes with anterior plate and screw fixation and intervertebral fusion from C4 through C7. Few segments appear intact with bony fusion demonstrated. Hardware components appear intact without fracture or dislocation. Degenerative changes at C2-3, C3-4, and C7-T1. Normal alignment of the cervical spine. No prevertebral soft tissue swelling. No focal bone lesion or bone destruction. C1-2 articulation appears intact. Soft tissues are unremarkable. IMPRESSION: No acute intracranial abnormalities. Soft tissue lacerations with subcutaneous scalp hematomas over the right anterior frontal region and posterior vertex. Postoperative changes with anterior plate and screw fixation and intervertebral fusion from C4 through C7. Degenerative changes in the cervical spine. No acute displaced fractures identified. Electronically Signed   By: Lucienne Capers M.D.   On: 11/19/2015 22:18   Ct Cervical Spine Wo Contrast  11/19/2015  CLINICAL DATA:  Patient fell in the bathroom today. Struck back of head. Laceration to the forehead. Passed out. Headache. EXAM: CT HEAD WITHOUT CONTRAST CT CERVICAL SPINE WITHOUT CONTRAST TECHNIQUE: Multidetector CT imaging of the head and cervical spine was performed following the standard protocol without intravenous contrast. Multiplanar CT image reconstructions of the cervical spine were also generated. COMPARISON:  10/14/2015 FINDINGS: CT HEAD FINDINGS Ventricles and sulci appear symmetrical. No ventricular dilatation. No mass effect or midline shift. No abnormal extra-axial fluid collections. Gray-white matter junctions are distinct. Basal cisterns are not effaced. No evidence of acute intracranial hemorrhage. No depressed skull fractures. Visualized paranasal sinuses  and mastoid air cells are not opacified. Subcutaneous scalp hematoma and subcutaneous gas over the right anterior frontal region and posterior vertex consistent with lacerations and hematomas. CT CERVICAL SPINE FINDINGS Postoperative changes with anterior plate and screw fixation and intervertebral fusion from C4 through C7. Few segments appear intact with bony fusion demonstrated. Hardware components appear intact without fracture or dislocation. Degenerative changes at C2-3, C3-4, and C7-T1. Normal alignment of the cervical spine. No prevertebral soft tissue swelling. No focal bone lesion or bone destruction. C1-2 articulation appears intact. Soft tissues are unremarkable. IMPRESSION: No acute intracranial abnormalities. Soft tissue lacerations with subcutaneous scalp hematomas over the right anterior frontal region and posterior vertex. Postoperative changes with anterior plate and screw fixation and intervertebral fusion from C4 through C7. Degenerative changes in the cervical spine. No acute displaced fractures identified. Electronically Signed   By: Lucienne Capers M.D.   On: 11/19/2015 22:18   Dg Toe Great Right  11/20/2015  CLINICAL DATA:  [place] in the shower yesterday, right great toe pain EXAM: RIGHT GREAT TOE COMPARISON:  None. FINDINGS: Three views of the right great toe submitted. There is nondisplaced fracture medial aspect at the base of distal phalanx. IMPRESSION: Nondisplaced fracture medial aspect at the base of distal phalanx great toe. Electronically Signed   By: Lahoma Crocker M.D.   On: 11/20/2015 10:35    Scheduled Meds: . [START ON 11/22/2015] DULoxetine  30 mg Oral Daily  . lamoTRIgine  100 mg Oral q morning - 10a  . levothyroxine  25 mcg Oral QAC breakfast   Continuous Infusions:  Antibiotics Given (last 72 hours)    None      Principal Problem:   Syncope Active Problems:   BIPOLAR AFFECTIVE DISORDER   Scalp laceration    Time spent:68min    [facility]  [facility] Pager [phone]. If 7PM-7AM, please contact night-coverage at [email or web address], password [facility] 11/21/2015, 1:59 PM  LOS: 1 day

## 2015-11-21 NOTE — Care Management Obs Status (Signed)
MEDICARE OBSERVATION STATUS NOTIFICATION   Patient Details  Name: [name] MRN: [id number] Date of Birth: 1966-02-10   Medicare Observation Status Notification Given:  Yes    Sharin Mons, RN 11/21/2015, 11:02 AM

## 2015-11-21 NOTE — Progress Notes (Signed)
Echocardiogram 2D Echocardiogram has been performed.  [name] 11/21/2015, 1:48 PM

## 2015-11-22 DIAGNOSIS — W19XXXA Unspecified fall, initial encounter: Secondary | ICD-10-CM | POA: Diagnosis not present

## 2015-11-22 DIAGNOSIS — F319 Bipolar disorder, unspecified: Secondary | ICD-10-CM | POA: Diagnosis not present

## 2015-11-22 DIAGNOSIS — R55 Syncope and collapse: Secondary | ICD-10-CM | POA: Diagnosis not present

## 2015-11-22 MED ORDER — OXYCODONE HCL 5 MG PO TABS
15.0000 mg | ORAL_TABLET | Freq: Once | ORAL | Status: AC
  Administered 2015-11-22: 15 mg via ORAL
  Filled 2015-11-22: qty 3

## 2015-11-22 MED ORDER — TRAZODONE HCL 100 MG PO TABS: 100.0000 mg | ORAL_TABLET | Freq: Every evening | ORAL | Status: DC | PRN

## 2015-11-22 MED ORDER — IBUPROFEN 600 MG PO TABS: 600.0000 mg | ORAL_TABLET | Freq: Once | ORAL | Status: DC

## 2015-11-22 MED ORDER — HYDROXYZINE HCL 50 MG PO TABS: 50.0000 mg | ORAL_TABLET | Freq: Four times a day (QID) | ORAL | Status: DC | PRN
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
## 2015-11-22 NOTE — Progress Notes (Signed)
Nsg Discharge Note  Admit Date:  11/19/2015 Discharge date: 11/22/2015   Terrance Mass to be D/C'd Home per MD order.  AVS completed.  Copy for chart, and copy for patient signed, and dated. Patient/caregiver able to verbalize understanding.  Discharge Medication:   Medication List    STOP taking these medications        BC HEADACHE POWDER PO     diazepam 5 MG tablet  Commonly known as:  VALIUM     furosemide 20 MG tablet  Commonly known as:  LASIX     HYDROcodone-acetaminophen 5-325 MG tablet  Commonly known as:  NORCO/VICODIN     ibuprofen 600 MG tablet  Commonly known as:  ADVIL,MOTRIN     levETIRAcetam 500 MG tablet  Commonly known as:  KEPPRA     prazosin 1 MG capsule  Commonly known as:  MINIPRESS     predniSONE 20 MG tablet  Commonly known as:  DELTASONE     pregabalin 50 MG capsule  Commonly known as:  LYRICA      TAKE these medications        acetaminophen 500 MG tablet  Commonly known as:  TYLENOL  Take 500 mg by mouth every 6 (six) hours as needed for mild pain.     DULoxetine 60 MG capsule  Commonly known as:  CYMBALTA  Take 1 capsule (60 mg total) by mouth 2 (two) times daily.     hydrOXYzine 50 MG tablet  Commonly known as:  ATARAX/VISTARIL  Take 1 tablet (50 mg total) by mouth every 6 (six) hours as needed for anxiety.     lamoTRIgine 100 MG tablet  Commonly known as:  LAMICTAL  Take 100 mg by mouth every morning.     levothyroxine 25 MCG tablet  Commonly known as:  SYNTHROID, LEVOTHROID  Take 1 tablet (25 mcg total) by mouth daily before breakfast.     omeprazole 20 MG capsule  Commonly known as:  PRILOSEC  Take 1 capsule (20 mg total) by mouth daily as needed (heartburn).     potassium chloride 10 MEQ tablet  Commonly known as:  K-DUR  Take 1 tablet (10 mEq total) by mouth daily as needed (with furosemide).     traZODone 100 MG tablet  Commonly known as:  DESYREL  Take 1 tablet (100 mg total) by mouth at bedtime as needed  for sleep.     trimethoprim 100 MG tablet  Commonly known as:  TRIMPEX  Take 1 tablet (100 mg total) by mouth daily.        Discharge Assessment: Filed Vitals:   11/21/15 2154 11/22/15 0505  BP: 100/65 107/48  Pulse: 75 66  Temp: 97.5 F (36.4 C) 97.4 F (36.3 C)  Resp:  18   Skin clean, dry and intact without evidence of skin break down, no evidence of skin tears noted. IV catheter discontinued intact. Site without signs and symptoms of complications - no redness or edema noted at insertion site, patient denies c/o pain - only slight tenderness at site.  Dressing with slight pressure applied.  D/c Instructions-Education: Discharge instructions given to patient/family with verbalized understanding. D/c education completed with patient/family including follow up instructions, medication list, d/c activities limitations if indicated, with other d/c instructions as indicated by MD - patient able to verbalize understanding, all questions fully answered. Patient instructed to return to ED, call 911, or call MD for any changes in condition.  Patient escorted via [place], and D/C home via  private auto.  Dayle Points, RN 11/22/2015 4:38 PM

## 2015-11-22 NOTE — Discharge Instructions (Signed)
Please minimise , all sedating medications , discuss with PCP about managing these medications

## 2015-11-23 NOTE — Discharge Summary (Signed)
Physician Discharge Summary  [name] MRN: [id number] DOB/AGE: 51-12-1965 51 y.o.  PCP: Antony Blackbird, MD   Admit date: 11/19/2015 Discharge date: 11/23/2015  Discharge Diagnoses:     Principal Problem:   Syncope Active Problems:   BIPOLAR AFFECTIVE DISORDER   Scalp laceration   Fall    Follow-up recommendations Follow-up with PCP in 3-5 days , including all  additional recommended appointments as below Follow-up CBC, CMP in 3-5 days Patient follow-up with PCP for removal of staples     Medication List    STOP taking these medications        BC HEADACHE POWDER PO     diazepam 5 MG tablet  Commonly known as:  VALIUM     furosemide 20 MG tablet  Commonly known as:  LASIX     HYDROcodone-acetaminophen 5-325 MG tablet  Commonly known as:  NORCO/VICODIN     ibuprofen 600 MG tablet  Commonly known as:  ADVIL,MOTRIN     levETIRAcetam 500 MG tablet  Commonly known as:  KEPPRA     prazosin 1 MG capsule  Commonly known as:  MINIPRESS     predniSONE 20 MG tablet  Commonly known as:  DELTASONE     pregabalin 50 MG capsule  Commonly known as:  LYRICA      TAKE these medications        acetaminophen 500 MG tablet  Commonly known as:  TYLENOL  Take 500 mg by mouth every 6 (six) hours as needed for mild pain.     DULoxetine 60 MG capsule  Commonly known as:  CYMBALTA  Take 1 capsule (60 mg total) by mouth 2 (two) times daily.     hydrOXYzine 50 MG tablet  Commonly known as:  ATARAX/VISTARIL  Take 1 tablet (50 mg total) by mouth every 6 (six) hours as needed for anxiety.     lamoTRIgine 100 MG tablet  Commonly known as:  LAMICTAL  Take 100 mg by mouth every morning.     levothyroxine 25 MCG tablet  Commonly known as:  SYNTHROID, LEVOTHROID  Take 1 tablet (25 mcg total) by mouth daily before breakfast.     omeprazole 20 MG capsule  Commonly known as:  PRILOSEC  Take 1 capsule (20 mg total) by mouth daily as needed (heartburn).     potassium  chloride 10 MEQ tablet  Commonly known as:  K-DUR  Take 1 tablet (10 mEq total) by mouth daily as needed (with furosemide).     traZODone 100 MG tablet  Commonly known as:  DESYREL  Take 1 tablet (100 mg total) by mouth at bedtime as needed for sleep.     trimethoprim 100 MG tablet  Commonly known as:  TRIMPEX  Take 1 tablet (100 mg total) by mouth daily.         Discharge Condition: Stable   Discharge Instructions       Discharge Instructions    Diet - low sodium heart healthy    Complete by:  As directed      Increase activity slowly    Complete by:  As directed            Allergies  Allergen Reactions  . Naproxen Anaphylaxis      Disposition: 01-Home or Self Care   Consults:  Cardiology     Significant Diagnostic Studies:  Dg Chest 2 View  11/19/2015  CLINICAL DATA:  Acute syncope and fall. EXAM: CHEST  2 VIEW COMPARISON:  10/14/2015 and  prior exams FINDINGS: The cardiomediastinal silhouette is unremarkable. There is no evidence of focal airspace disease, pulmonary edema, suspicious pulmonary nodule/mass, pleural effusion, or pneumothorax. No acute bony abnormalities are identified. Cervical spine surgical hardware again noted. IMPRESSION: No active cardiopulmonary disease. Electronically Signed   By: Margarette Canada M.D.   On: 11/19/2015 22:02   Ct Head Wo Contrast  11/19/2015  CLINICAL DATA:  Patient fell in the bathroom today. Struck back of head. Laceration to the forehead. Passed out. Headache. EXAM: CT HEAD WITHOUT CONTRAST CT CERVICAL SPINE WITHOUT CONTRAST TECHNIQUE: Multidetector CT imaging of the head and cervical spine was performed following the standard protocol without intravenous contrast. Multiplanar CT image reconstructions of the cervical spine were also generated. COMPARISON:  10/14/2015 FINDINGS: CT HEAD FINDINGS Ventricles and sulci appear symmetrical. No ventricular dilatation. No mass effect or midline shift. No abnormal extra-axial fluid  collections. Gray-white matter junctions are distinct. Basal cisterns are not effaced. No evidence of acute intracranial hemorrhage. No depressed skull fractures. Visualized paranasal sinuses and mastoid air cells are not opacified. Subcutaneous scalp hematoma and subcutaneous gas over the right anterior frontal region and posterior vertex consistent with lacerations and hematomas. CT CERVICAL SPINE FINDINGS Postoperative changes with anterior plate and screw fixation and intervertebral fusion from C4 through C7. Few segments appear intact with bony fusion demonstrated. Hardware components appear intact without fracture or dislocation. Degenerative changes at C2-3, C3-4, and C7-T1. Normal alignment of the cervical spine. No prevertebral soft tissue swelling. No focal bone lesion or bone destruction. C1-2 articulation appears intact. Soft tissues are unremarkable. IMPRESSION: No acute intracranial abnormalities. Soft tissue lacerations with subcutaneous scalp hematomas over the right anterior frontal region and posterior vertex. Postoperative changes with anterior plate and screw fixation and intervertebral fusion from C4 through C7. Degenerative changes in the cervical spine. No acute displaced fractures identified. Electronically Signed   By: Lucienne Capers M.D.   On: 11/19/2015 22:18   Ct Cervical Spine Wo Contrast  11/19/2015  CLINICAL DATA:  Patient fell in the bathroom today. Struck back of head. Laceration to the forehead. Passed out. Headache. EXAM: CT HEAD WITHOUT CONTRAST CT CERVICAL SPINE WITHOUT CONTRAST TECHNIQUE: Multidetector CT imaging of the head and cervical spine was performed following the standard protocol without intravenous contrast. Multiplanar CT image reconstructions of the cervical spine were also generated. COMPARISON:  10/14/2015 FINDINGS: CT HEAD FINDINGS Ventricles and sulci appear symmetrical. No ventricular dilatation. No mass effect or midline shift. No abnormal extra-axial  fluid collections. Gray-white matter junctions are distinct. Basal cisterns are not effaced. No evidence of acute intracranial hemorrhage. No depressed skull fractures. Visualized paranasal sinuses and mastoid air cells are not opacified. Subcutaneous scalp hematoma and subcutaneous gas over the right anterior frontal region and posterior vertex consistent with lacerations and hematomas. CT CERVICAL SPINE FINDINGS Postoperative changes with anterior plate and screw fixation and intervertebral fusion from C4 through C7. Few segments appear intact with bony fusion demonstrated. Hardware components appear intact without fracture or dislocation. Degenerative changes at C2-3, C3-4, and C7-T1. Normal alignment of the cervical spine. No prevertebral soft tissue swelling. No focal bone lesion or bone destruction. C1-2 articulation appears intact. Soft tissues are unremarkable. IMPRESSION: No acute intracranial abnormalities. Soft tissue lacerations with subcutaneous scalp hematomas over the right anterior frontal region and posterior vertex. Postoperative changes with anterior plate and screw fixation and intervertebral fusion from C4 through C7. Degenerative changes in the cervical spine. No acute displaced fractures identified. Electronically Signed   By: Gwyndolyn Saxon  Gerilyn Nestle M.D.   On: 11/19/2015 22:18   Dg Toe Great Right  11/20/2015  CLINICAL DATA:  [place] in the shower yesterday, right great toe pain EXAM: RIGHT GREAT TOE COMPARISON:  None. FINDINGS: Three views of the right great toe submitted. There is nondisplaced fracture medial aspect at the base of distal phalanx. IMPRESSION: Nondisplaced fracture medial aspect at the base of distal phalanx great toe. Electronically Signed   By: Lahoma Crocker M.D.   On: 11/20/2015 10:35    V EF: 55% -  60%  ------------------------------------------------------------------- Indications:   Syncope  780.2.  ------------------------------------------------------------------- History:  PMH: ETOH. No prior cardiac history.  ------------------------------------------------------------------- Study Conclusions  - Left ventricle: The cavity size was normal. Systolic function was normal. The estimated ejection fraction was in the range of 55% to 60%. Wall motion was normal; there were no regional wall motion abnormalities. Left ventricular diastolic function parameters were normal. - Aortic valve: There was moderate regurgitation. - Mitral valve: Transvalvular velocity was within the normal range. There was no evidence for stenosis. There was mild regurgitation. - Right ventricle: The cavity size was normal. Wall thickness was normal. Systolic function was normal. - Atrial septum: No defect or patent foramen ovale was identified by color flow Doppler. - Tricuspid valve: There was mild regurgitation. - Pulmonary arteries: Systolic pressure was within the normal range. PA peak pressure: 21 mm Hg (S). - Inferior vena cava: The vessel was normal in size. The respirophasic diameter changes were in the normal range (>= 50%), consistent with normal central venous pressure.   Filed Weights   11/20/15 0113 11/21/15 0508 11/22/15 0505  Weight: 59.4 kg (130 lb 15.3 oz) 60.5 kg (133 lb 6.1 oz) 58.4 kg (128 lb 12 oz)     Microbiology: No results found for this or any previous visit (from the past 240 hour(s)).     Blood Culture No results found for: SDES, SPECREQUEST, CULT, REPTSTATUS    Labs: No results found for this or any previous visit (from the past 48 hour(s)).   Lipid Panel     Component Value Date/Time   CHOL  05/27/2007 0450    122        ATP III CLASSIFICATION:  <200     mg/dL   Desirable  200-239  mg/dL   Borderline High  >=240    mg/dL   High   TRIG 35 05/27/2007 0450   HDL 56 05/27/2007 0450   CHOLHDL 2.2 05/27/2007 0450   VLDL 7  05/27/2007 0450   LDLCALC  05/27/2007 0450    59        Total Cholesterol/HDL:CHD Risk Coronary Heart Disease Risk Table                     Men   Women  1/2 Average Risk   3.4   3.3     No results found for: HGBA1C   Lab Results  Component Value Date   [facility]  05/27/2007    59        Total Cholesterol/HDL:CHD Risk Coronary Heart Disease Risk Table                     Men   Women  1/2 Average Risk   3.4   3.3   CREATININE 0.80 11/20/2015     HPI 50 y.o. female with a past medical history significant for seizures, depression, anxiety, and DDD. She reports that she has not had a seizure  in over a year prior to admission. 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 She discontinued her Lamictal and Cymbalta 2 days prior to admission. On the day of admission, she was in her usual state of health. She was in the shower around 5:30PM (normally showers at night). She remembers having palpitations and then waking up on the shower floor. She then tried to get up and again passed out. She then made it out of the shower into the bathroom where she passed out again and hit her head. Her husband then came into the bathroom and reports that she had another episode immediately after standing where her body went "rigid" and she fell backwards. She tried to get up twice more after that and was unable to without again losing consciousness. Her husband picked her up and carried her to the bed where he called EMS. After a couple of minutes, she regained consciousness and was no longer rigid. On EMS arrival they documented hypotension but no arrhythmias. Orthostatics this morning were slightly positive. She has had no arrhythmias on telemetry. EP has been asked to evaluate for treatment options.   Lab work on admission is notable for elevated ETOH level, UDS +marijuana, negative troponin, normal electrolytes. Echo within normal limits as above, EEG was also found to be  normal  HOSPITAL COURSE:  Syncope- thought to  be secondary to vasovagal syncope vs orthostatic syncope Patient was found to be hypotensive upon admission, QTC was mildly prolonged , Cardiology was consulted and they do not feel that the patient had an arrhythmia, 2-D echo was found to be normal  Bipolar disorder EtOH level slightly elevated, UDS positive for marijuana Patient also found to be on multiple sedating medications Her medication list was streamlined , patient apparently has been off of most of her medications for several months And patient was only discharged hydroxyzine and trazodone for anxiety and sleep    laceration to the scalp 4 staples were placed by the ER, patient continued to endorse pain Pain medications were limited because of the patient's low blood pressure Patient can have her staples removed at the PCPs office   Discharge Exam:    Blood pressure 107/48, pulse 66, temperature 97.4 F (36.3 C), temperature source Oral, resp. rate 18, height 5\' 1"  (1.549 m), weight 58.4 kg (128 lb 12 oz), last menstrual period 10/24/2015, SpO2 98 %.      Follow-up Information    Follow up with FULP, CAMMIE, MD. Schedule an appointment as soon as possible for a visit in 3 days.   Specialty:  Family Medicine   Contact information:   [address] [address] [phone]       Follow up with [facility].   Why:  rolling walker to be delivered to bedside   Contact information:   [address] [place] [address] [phone]       Signed: Reyne Dumas 11/23/2015, 9:36 AM        Time spent >45 mins

## 2015-11-25 ENCOUNTER — Other Ambulatory Visit: Payer: Self-pay | Admitting: Family Medicine

## 2015-11-25 DIAGNOSIS — Z1231 Encounter for screening mammogram for malignant neoplasm of breast: Secondary | ICD-10-CM

## 2015-11-28 DIAGNOSIS — Z79899 Other long term (current) drug therapy: Secondary | ICD-10-CM | POA: Diagnosis not present

## 2015-11-28 DIAGNOSIS — M25522 Pain in left elbow: Secondary | ICD-10-CM | POA: Diagnosis not present

## 2015-11-28 DIAGNOSIS — Z760 Encounter for issue of repeat prescription: Secondary | ICD-10-CM | POA: Diagnosis not present

## 2015-11-28 DIAGNOSIS — E7800 Pure hypercholesterolemia, unspecified: Secondary | ICD-10-CM | POA: Diagnosis not present

## 2015-11-28 DIAGNOSIS — E559 Vitamin D deficiency, unspecified: Secondary | ICD-10-CM | POA: Diagnosis not present

## 2015-11-28 DIAGNOSIS — R51 Headache: Secondary | ICD-10-CM | POA: Diagnosis not present

## 2015-11-28 DIAGNOSIS — R55 Syncope and collapse: Secondary | ICD-10-CM | POA: Diagnosis not present

## 2015-12-12 DIAGNOSIS — M5412 Radiculopathy, cervical region: Secondary | ICD-10-CM | POA: Diagnosis not present

## 2015-12-12 DIAGNOSIS — M47816 Spondylosis without myelopathy or radiculopathy, lumbar region: Secondary | ICD-10-CM | POA: Diagnosis not present

## 2015-12-12 DIAGNOSIS — M5416 Radiculopathy, lumbar region: Secondary | ICD-10-CM | POA: Diagnosis not present

## 2015-12-12 DIAGNOSIS — M47812 Spondylosis without myelopathy or radiculopathy, cervical region: Secondary | ICD-10-CM | POA: Diagnosis not present

## 2015-12-17 ENCOUNTER — Ambulatory Visit (AMBULATORY_SURGERY_CENTER): Payer: Self-pay | Admitting: *Deleted

## 2015-12-17 VITALS — Ht 61.0 in | Wt 131.2 lb

## 2015-12-17 DIAGNOSIS — Z1211 Encounter for screening for malignant neoplasm of colon: Secondary | ICD-10-CM

## 2015-12-17 MED ORDER — SUPREP BOWEL PREP KIT 17.5-3.13-1.6 GM/177ML PO SOLN: 1.0000 | Freq: Once | ORAL | Status: DC

## 2015-12-17 NOTE — Progress Notes (Signed)
Patient denies any allergies to egg or soy products. Patient denies complications with anesthesia/sedation.  Patient denies oxygen use at home and denies diet medications. Emmi instructions for colonoscopy explained but patient denied.     

## 2015-12-17 NOTE — Progress Notes (Signed)
After completing PV, patient realized that she has transportation issues and will reschedule colonoscopy in Summer 2017.  Colonoscopy on 12/31/15 was cancelled.  KM

## 2015-12-26 ENCOUNTER — Ambulatory Visit
Admission: RE | Admit: 2015-12-26 | Discharge: 2015-12-26 | Disposition: A | Discharge status: Home / Self Care | Payer: Commercial Managed Care - HMO | Source: Ambulatory Visit | Attending: Family Medicine | Admitting: Family Medicine

## 2015-12-26 DIAGNOSIS — Z1231 Encounter for screening mammogram for malignant neoplasm of breast: Secondary | ICD-10-CM

## 2015-12-30 ENCOUNTER — Other Ambulatory Visit: Payer: Self-pay | Admitting: Family Medicine

## 2015-12-30 DIAGNOSIS — R928 Other abnormal and inconclusive findings on diagnostic imaging of breast: Secondary | ICD-10-CM

## 2015-12-31 ENCOUNTER — Encounter: Payer: Self-pay | Admitting: Internal Medicine

## 2016-01-08 ENCOUNTER — Ambulatory Visit
Admission: RE | Admit: 2016-01-08 | Discharge: 2016-01-08 | Disposition: A | Discharge status: Home / Self Care | Payer: Commercial Managed Care - HMO | Source: Ambulatory Visit | Attending: Family Medicine | Admitting: Family Medicine

## 2016-01-08 DIAGNOSIS — R928 Other abnormal and inconclusive findings on diagnostic imaging of breast: Secondary | ICD-10-CM

## 2016-05-11 IMAGING — CT CT CERVICAL SPINE W/O CM
4 of 6 series · 13 of 33 positions shown, 15 images · non-contrast
Comparison: 10/14/2015

CLINICAL DATA: Patient fell in the bathroom today. Struck back of
head. Laceration to the forehead. Passed out. Headache.

EXAM:
CT HEAD WITHOUT CONTRAST
CT CERVICAL SPINE WITHOUT CONTRAST
TECHNIQUE: Multidetector CT imaging of the head and cervical spine was
performed following the standard protocol without intravenous
contrast. Multiplanar CT image reconstructions of the cervical spine
were also generated.

[Series 5: c_spine 2.0 st · axial · 0.34mm/px · z∈[-199,-135]mm · 2 of 96 slices shown, 3 images]
[im 32/96  soft-tissue]
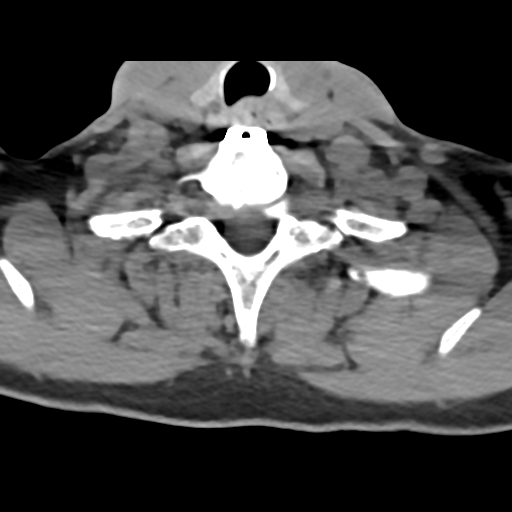
[im 32/96  bone]
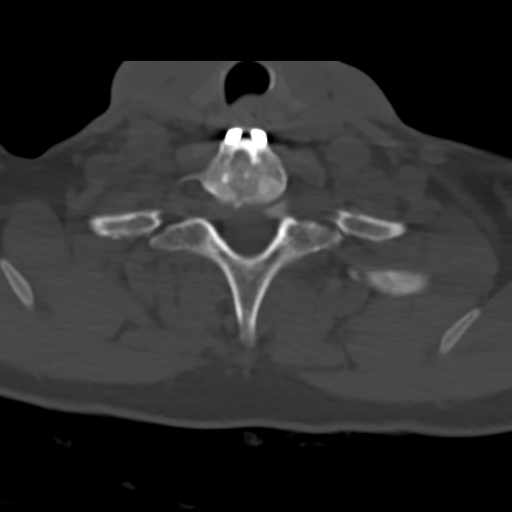
[im 64/96  bone]
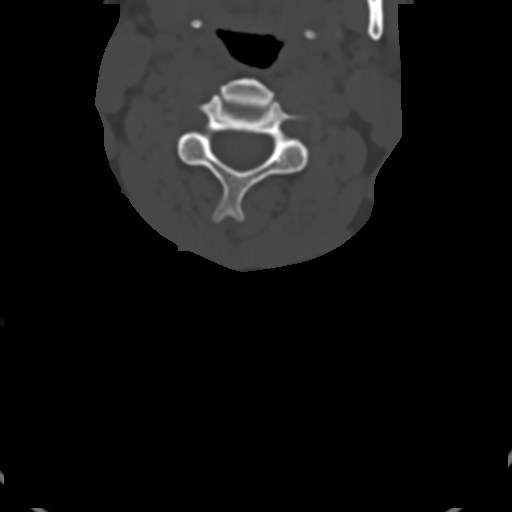

[Series 7: c_spine 2.0 sag bone · sagittal · 0.28mm/px · 5 of 61 slices shown, 6 images]
[im 21/61  bone]
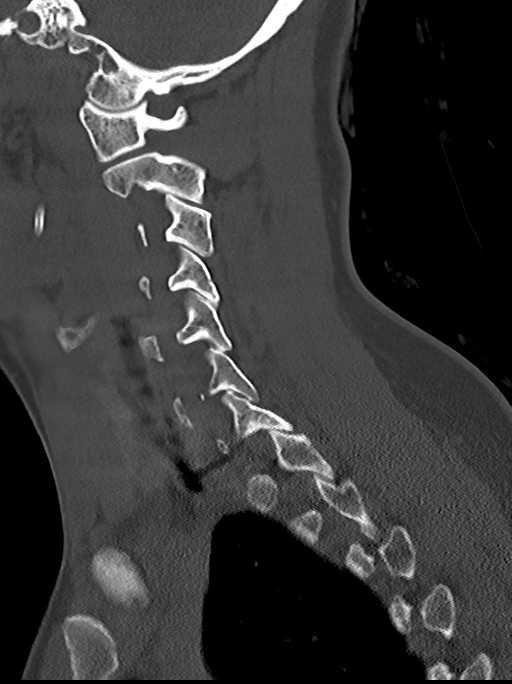
[im 26/61  bone]
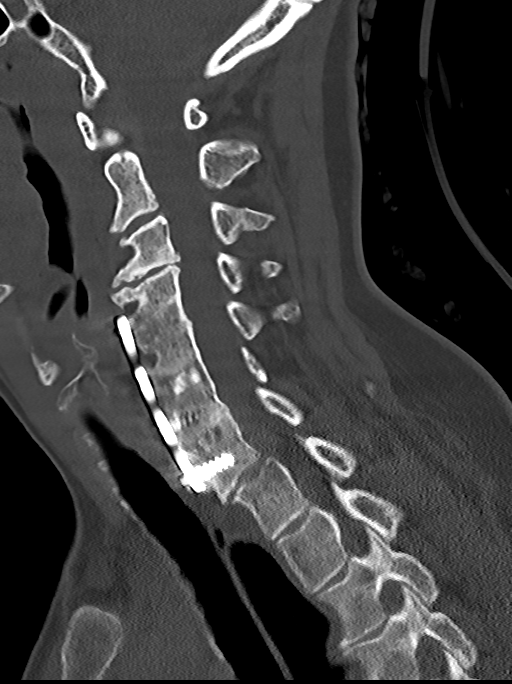
[im 31/61  soft-tissue]
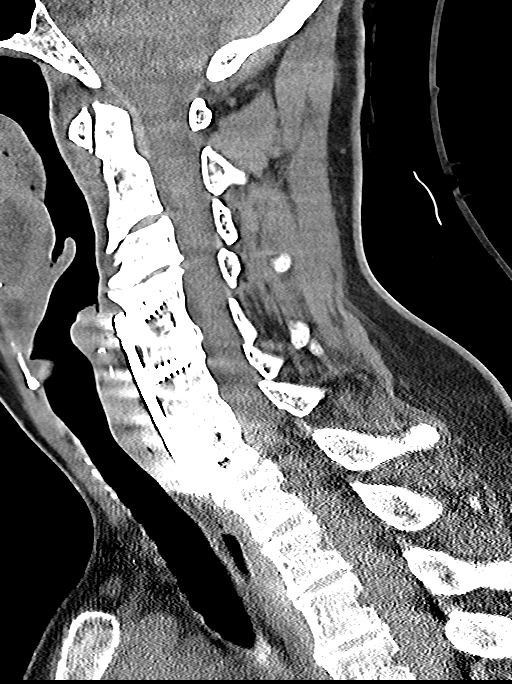
[im 31/61  bone]
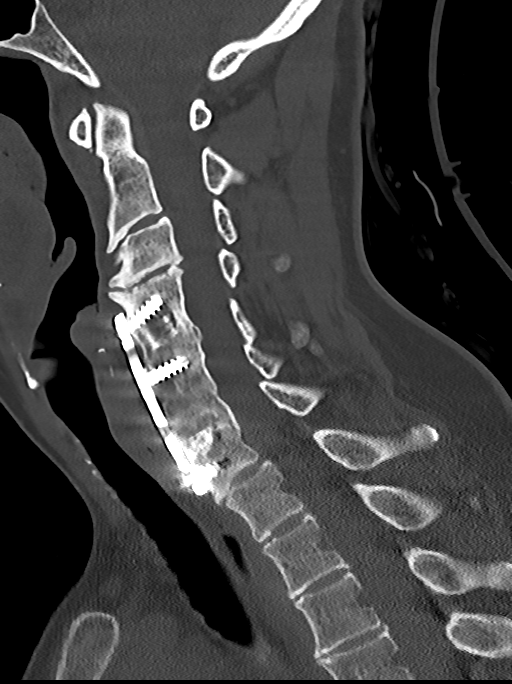
[im 36/61  bone]
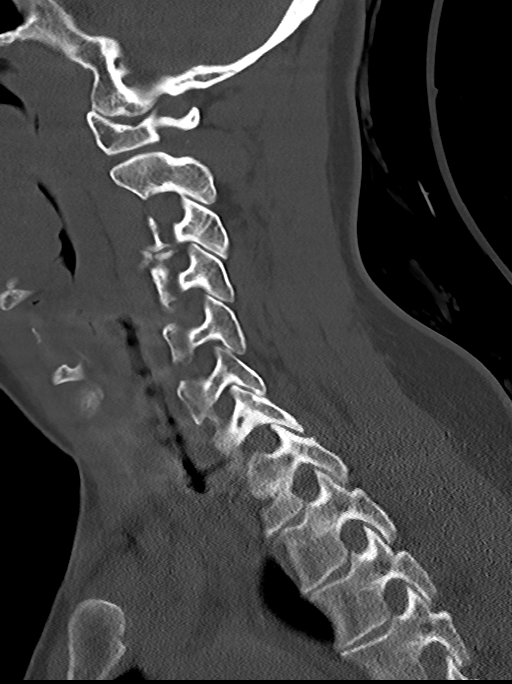
[im 41/61  bone]
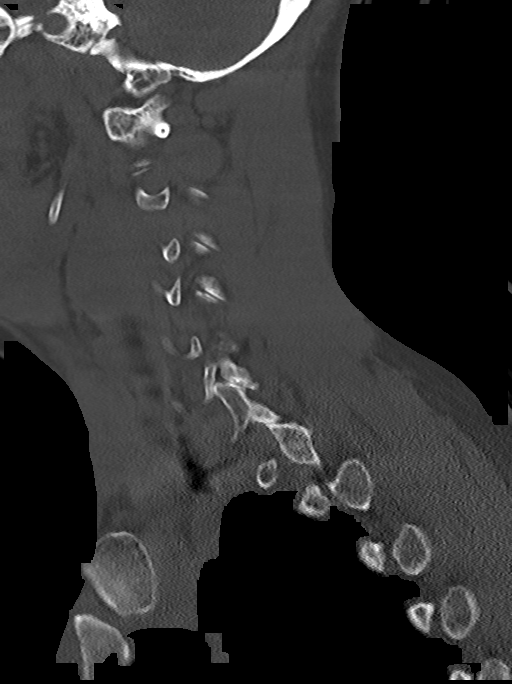

[Series 8: c_spine 2.0 cor bone · coronal · 0.28mm/px · 3 of 48 slices shown]
[im 10/48  bone]
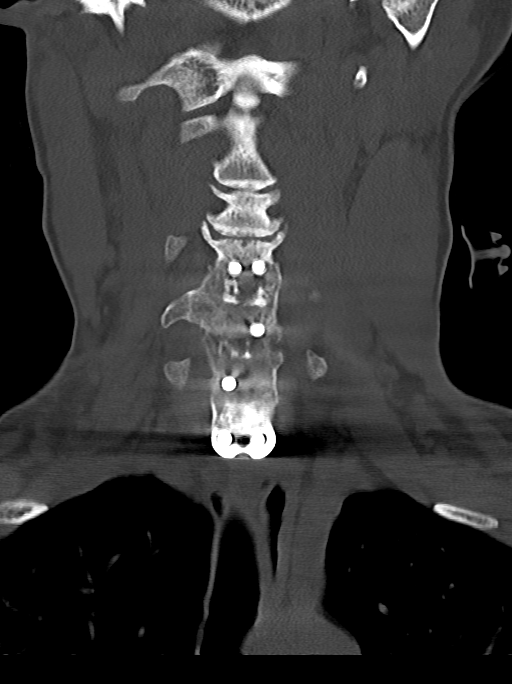
[im 19/48  bone]
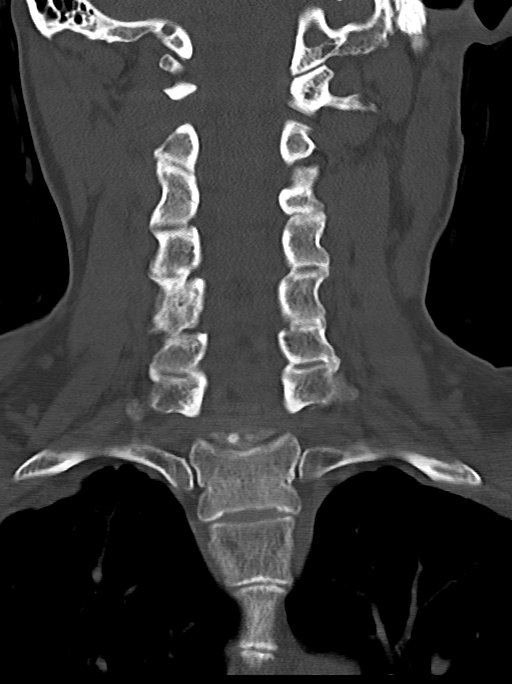
[im 29/48  bone]
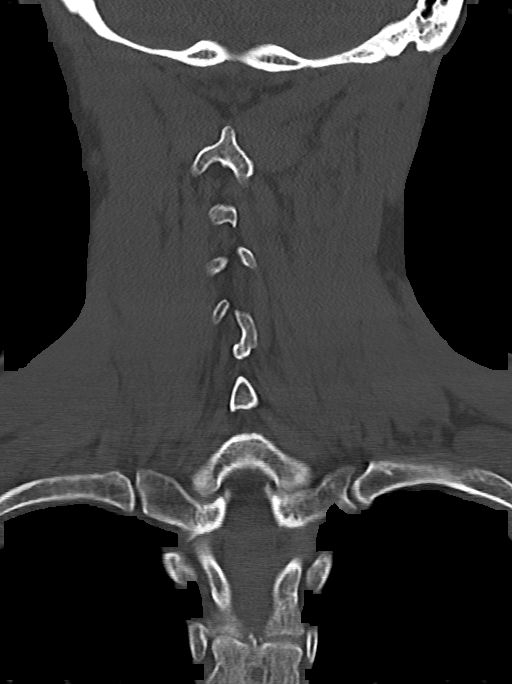

[Series 10: c_spine 2.0 orthogonals · axial · 0.21mm/px · z∈[-224,-146]mm · 3 of 102 slices shown]
[im 26/102  bone]
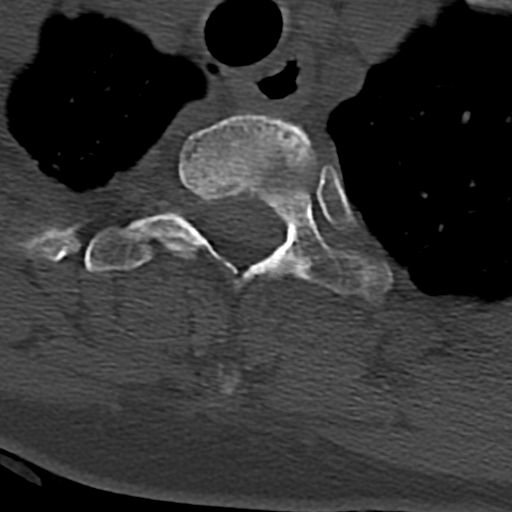
[im 51/102  bone]
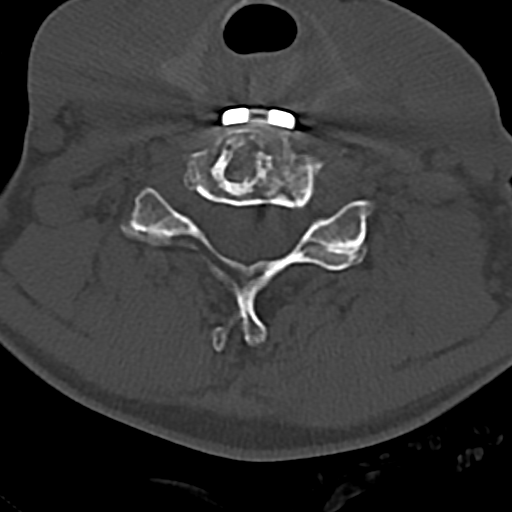
[im 76/102  bone]
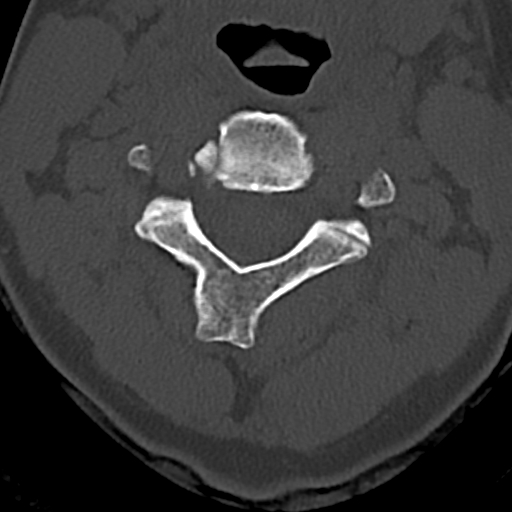

[13 of 33 positions shown; findings below may reference images not displayed]

FINDINGS: CT HEAD FINDINGS

Ventricles and sulci appear symmetrical. No ventricular dilatation.
No mass effect or midline shift. No abnormal extra-axial fluid
collections. Gray-white matter junctions are distinct. Basal
cisterns are not effaced. No evidence of acute intracranial
hemorrhage. No depressed skull fractures. Visualized paranasal
sinuses and mastoid air cells are not opacified. Subcutaneous scalp
hematoma and subcutaneous gas over the right anterior frontal region
and posterior vertex consistent with lacerations and hematomas.

CT CERVICAL SPINE FINDINGS

Postoperative changes with anterior plate and screw fixation and
intervertebral fusion from C4 through C7. Few segments appear intact
with bony fusion demonstrated. Hardware components appear intact
without fracture or dislocation. Degenerative changes at C2-3, C3-4,
and C7-T1. Normal alignment of the cervical spine. No prevertebral
soft tissue swelling. No focal bone lesion or bone destruction. C1-2
articulation appears intact. Soft tissues are unremarkable.
IMPRESSION: No acute intracranial abnormalities. Soft tissue lacerations with
subcutaneous scalp hematomas over the right anterior frontal region
and posterior vertex.

Postoperative changes with anterior plate and screw fixation and
intervertebral fusion from C4 through C7. Degenerative changes in
the cervical spine. No acute displaced fractures identified.

## 2016-05-12 IMAGING — DX DG TOE GREAT 2+V*R*
3 series · 3 of 3 positions shown · non-contrast
Comparison: None.

CLINICAL DATA: Fell in the shower yesterday, right great toe pain

EXAM:
RIGHT GREAT TOE

[toe ap]
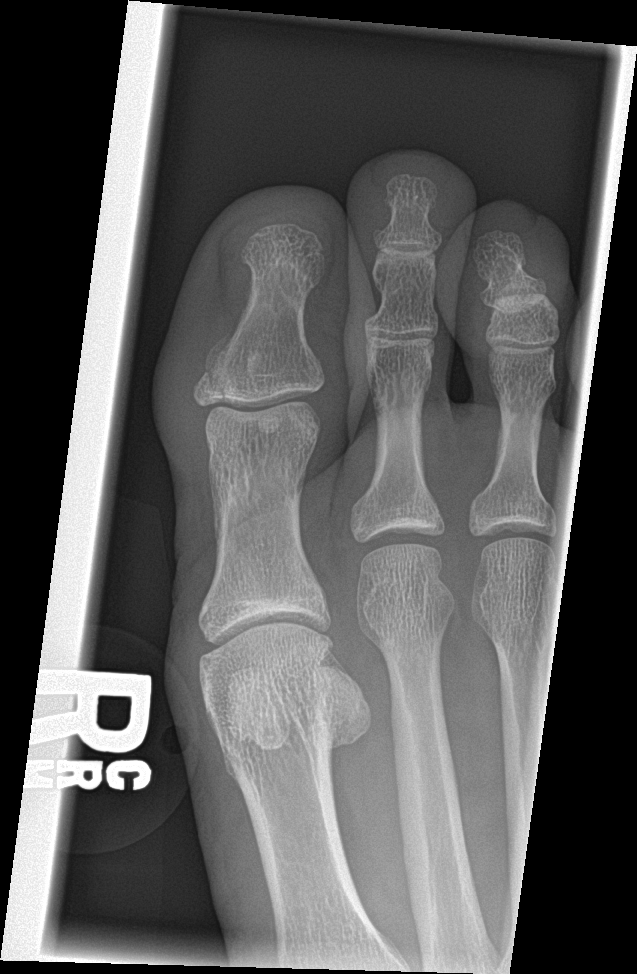

[toe obl]
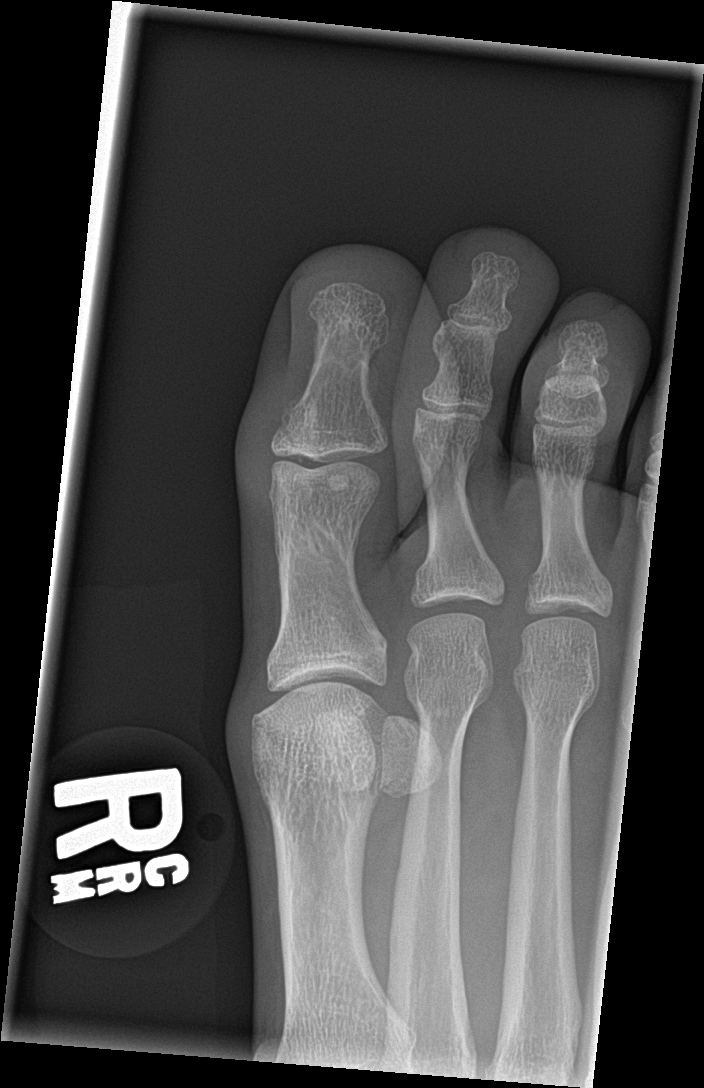

[toe lat]
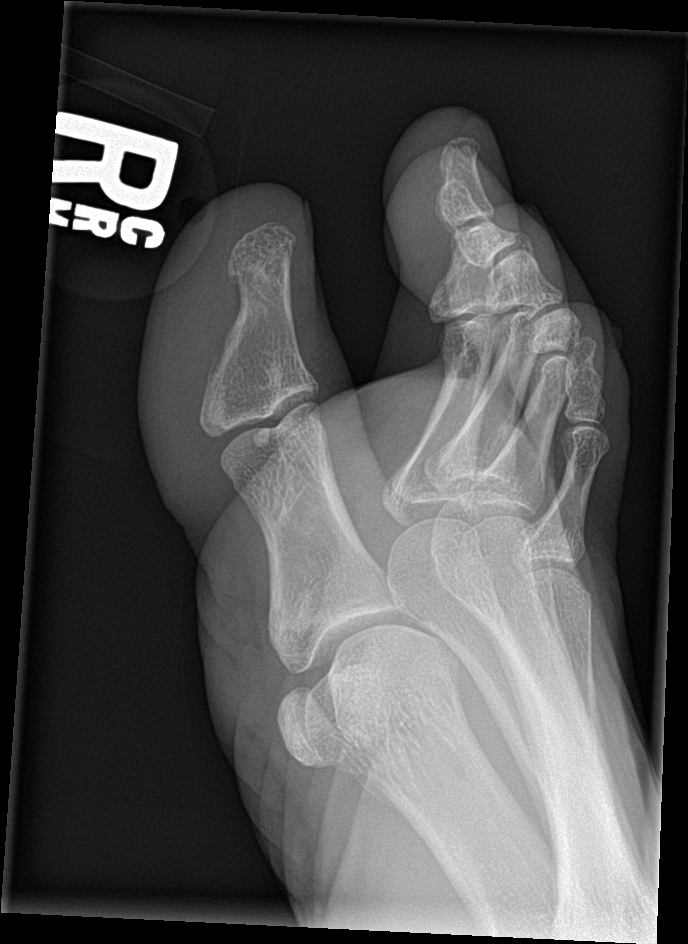

[3 of 3 positions shown; findings below may reference images not displayed]

FINDINGS: Three views of the right great toe submitted. There is nondisplaced
fracture medial aspect at the base of distal phalanx.
IMPRESSION: Nondisplaced fracture medial aspect at the base of distal phalanx
great toe.

## 2016-05-18 ENCOUNTER — Encounter (HOSPITAL_COMMUNITY): Payer: Self-pay | Admitting: *Deleted

## 2016-05-18 DIAGNOSIS — Z79899 Other long term (current) drug therapy: Secondary | ICD-10-CM | POA: Insufficient documentation

## 2016-05-18 DIAGNOSIS — L0211 Cutaneous abscess of neck: Secondary | ICD-10-CM | POA: Diagnosis present

## 2016-05-18 DIAGNOSIS — F319 Bipolar disorder, unspecified: Secondary | ICD-10-CM | POA: Insufficient documentation

## 2016-05-18 DIAGNOSIS — L03221 Cellulitis of neck: Secondary | ICD-10-CM | POA: Insufficient documentation

## 2016-05-18 DIAGNOSIS — D649 Anemia, unspecified: Secondary | ICD-10-CM | POA: Diagnosis not present

## 2016-05-18 LAB — COMPREHENSIVE METABOLIC PANEL
ALBUMIN: 3.4 g/dL — AB (ref 3.5–5.0)
ALT: 13 U/L — ABNORMAL LOW (ref 14–54)
ANION GAP: 9 (ref 5–15)
AST: 17 U/L (ref 15–41)
Alkaline Phosphatase: 60 U/L (ref 38–126)
BUN: 12 mg/dL (ref 6–20)
CALCIUM: 9.3 mg/dL (ref 8.9–10.3)
CO2: 26 mmol/L (ref 22–32)
Chloride: 99 mmol/L — ABNORMAL LOW (ref 101–111)
Creatinine, Ser: 0.82 mg/dL (ref 0.44–1.00)
GFR calc non Af Amer: >60 mL/min (ref >60)
GLUCOSE: 112 mg/dL — AB (ref 65–99)
POTASSIUM: 3.7 mmol/L (ref 3.5–5.1)
SODIUM: 134 mmol/L — AB (ref 135–145)
Total Bilirubin: 0.6 mg/dL (ref 0.3–1.2)
Total Protein: 6.5 g/dL (ref 6.5–8.1)

## 2016-05-18 LAB — URINALYSIS, ROUTINE W REFLEX MICROSCOPIC
BILIRUBIN URINE: NEGATIVE
Glucose, UA: NEGATIVE mg/dL
KETONES UR: NEGATIVE mg/dL
LEUKOCYTES UA: NEGATIVE
NITRITE: NEGATIVE
PH: 6 (ref 5.0–8.0)
Protein, ur: NEGATIVE mg/dL
SPECIFIC GRAVITY, URINE: 1.011 (ref 1.005–1.030)

## 2016-05-18 LAB — CBC WITH DIFFERENTIAL/PLATELET
BASOS PCT: 0 %
Basophils Absolute: 0 10*3/uL (ref 0.0–0.1)
EOS ABS: 0.2 10*3/uL (ref 0.0–0.7)
EOS PCT: 2 %
HCT: 33.8 % — ABNORMAL LOW (ref 36.0–46.0)
Hemoglobin: 11.2 g/dL — ABNORMAL LOW (ref 12.0–15.0)
LYMPHS ABS: 1.4 10*3/uL (ref 0.7–4.0)
Lymphocytes Relative: 21 %
MCH: 31.8 pg (ref 26.0–34.0)
MCHC: 33.1 g/dL (ref 30.0–36.0)
MCV: 96 fL (ref 78.0–100.0)
MONO ABS: 0.8 10*3/uL (ref 0.1–1.0)
MONOS PCT: 11 %
NEUTROS PCT: 66 %
Neutro Abs: 4.6 10*3/uL (ref 1.7–7.7)
PLATELETS: 242 10*3/uL (ref 150–400)
RBC: 3.52 MIL/uL — ABNORMAL LOW (ref 3.87–5.11)
RDW: 12.5 % (ref 11.5–15.5)
WBC: 6.9 10*3/uL (ref 4.0–10.5)

## 2016-05-18 LAB — URINE MICROSCOPIC-ADD ON

## 2016-05-18 LAB — I-STAT CG4 LACTIC ACID, ED: Lactic Acid, Venous: 0.38 mmol/L — ABNORMAL LOW (ref 0.5–1.9)

## 2016-05-18 NOTE — ED Triage Notes (Signed)
Pt has abscess to right neck around hair line. Pt states she has been picking on it for a couple of days. Pt reports general malaise and not feeling good. Pt states she took some Ibuprofen around 1800.

## 2016-05-19 ENCOUNTER — Emergency Department (HOSPITAL_COMMUNITY)
Admission: EM | Admit: 2016-05-19 | Discharge: 2016-05-19 | Disposition: A | Discharge status: Home / Self Care | Payer: Medicare HMO | Attending: Emergency Medicine | Admitting: Emergency Medicine

## 2016-05-19 DIAGNOSIS — L03221 Cellulitis of neck: Secondary | ICD-10-CM

## 2016-05-19 DIAGNOSIS — D649 Anemia, unspecified: Secondary | ICD-10-CM

## 2016-05-19 DIAGNOSIS — R509 Fever, unspecified: Secondary | ICD-10-CM

## 2016-05-19 LAB — I-STAT CG4 LACTIC ACID, ED: LACTIC ACID, VENOUS: 0.69 mmol/L (ref 0.5–1.9)

## 2016-05-19 MED ORDER — TRAMADOL HCL 50 MG PO TABS: 50.0000 mg | ORAL_TABLET | Freq: Four times a day (QID) | ORAL | 0 refills | Status: DC | PRN

## 2016-05-19 MED ORDER — DOXYCYCLINE HYCLATE 100 MG PO TABS
100.0000 mg | ORAL_TABLET | Freq: Once | ORAL | Status: AC
  Administered 2016-05-19: 100 mg via ORAL
  Filled 2016-05-19: qty 1

## 2016-05-19 MED ORDER — DOXYCYCLINE HYCLATE 100 MG PO CAPS: 100.0000 mg | ORAL_CAPSULE | Freq: Two times a day (BID) | ORAL | 0 refills | Status: DC

## 2016-05-19 NOTE — Discharge Instructions (Signed)
Continue to take acetaminophen and/or ibuprofen as needed for fever or pain. Use tramadol for more severe pain. Apply warm compresses to the area on her neck. Try to avoid picking at it. Return if the area seems to be getting more swollen or fast symptoms in general are getting worse.

## 2016-05-19 NOTE — ED Provider Notes (Signed)
[facility] DEPT Provider Note   CSN: [id number] Arrival date & time: 05/18/16  [id number]  First Provider Contact:  First MD Initiated Contact with Patient 05/19/16 [phone]        History   Chief Complaint Chief Complaint  Patient presents with  . Abscess    HPI [name] is a 50 y.o. female.  The history is provided by the patient.  Abscess  She noted an irritated area on the right side of her neck about one week ago. She has been picking at it during that time but has not noticed any drainage. It is moderately painful and she rates pain at 8/10. She started running a fever tonight which went as high as 103. This is been associated with chills. She denies rhinorrhea, sore throat, cough, dysuria. She did take a dose of ibuprofen and temperature is come down following that. She relates that she has been under a lot of stress with an illness in the family and that it may been why she has been actively picking at the lesion.  Past Medical History:  Diagnosis Date  . Allergy   . Anxiety   . Bipolar disorder ([place])   . DDD (degenerative disc disease)   . Depression   . HSV infection   . Hx: UTI (urinary tract infection)   . Pertussis   . Seizures ([place])    last seizure 12/2014   . Thyroid disease     Patient Active Problem List   Diagnosis Date Noted  . Fall   . Syncope 11/20/2015  . Scalp laceration 11/20/2015  . Major depressive disorder, recurrent severe without psychotic features ([place]) 03/23/2015  . DDD (degenerative disc disease)   . ACUTE BRONCHITIS 10/22/2009  . GERD 08/02/2009  . DEGENERATIVE DISC DISEASE 08/02/2009  . COUGH 08/02/2009  . BIPOLAR AFFECTIVE DISORDER 08/01/2009  . ALCOHOLISM 08/01/2009  . BULIMIA 08/01/2009  . HEARING LOSS, BILATERAL 08/01/2009  . OSTEOPENIA 08/01/2009  . SEIZURE DISORDER 08/01/2009    Past Surgical History:  Procedure Laterality Date  . CERVICAL SPINE SURGERY     x 3 2001,2005,2007 c5-6  . CESAREAN SECTION  2003  .  TONSILLECTOMY    . TUBAL LIGATION    . WISDOM TOOTH EXTRACTION      OB History    No data available       Home Medications    Prior to Admission medications   Medication Sig Start Date End Date Taking? Authorizing Provider  acetaminophen (TYLENOL) 500 MG tablet Take 500 mg by mouth every 6 (six) hours as needed for mild pain.   Yes Historical Provider, MD  amphetamine-dextroamphetamine (ADDERALL) 20 MG tablet Take 20 mg by mouth daily.   Yes Historical Provider, MD  cholecalciferol (VITAMIN D) 1000 units tablet Take 1,000 Units by mouth daily.   Yes Historical Provider, MD  ibuprofen (ADVIL,MOTRIN) 600 MG tablet Take 600 mg by mouth every 6 (six) hours as needed for moderate pain.    Yes Historical Provider, MD  lamoTRIgine (LAMICTAL) 100 MG tablet Take 100 mg by mouth every morning. 08/02/15  Yes Historical Provider, MD  levothyroxine (SYNTHROID, LEVOTHROID) 25 MCG tablet Take 1 tablet (25 mcg total) by mouth daily before breakfast. 03/28/15  Yes Kerrie Buffalo, NP  lurasidone (LATUDA) 40 MG TABS tablet Take 40 mg by mouth daily with breakfast.   Yes Historical Provider, MD  oxyCODONE (OXY IR/ROXICODONE) 5 MG immediate release tablet Take 10 mg by mouth every 6 (six) hours.   Yes  Historical Provider, MD  traZODone (DESYREL) 100 MG tablet Take 1 tablet (100 mg total) by mouth at bedtime as needed for sleep. 11/22/15  Yes Reyne Dumas, MD  valACYclovir (VALTREX) 500 MG tablet Take 500 mg by mouth daily.  10/30/15  Yes Historical Provider, MD  vitamin B-12 (CYANOCOBALAMIN) 1000 MCG tablet Take 1,000 mcg by mouth daily.   Yes Historical Provider, MD    Family History Family History  Problem Relation Age of Onset  . Diabetes Mellitus II Neg Hx   . CAD Neg Hx   . Colon cancer Neg Hx   . Colon polyps Neg Hx   . Heart disease Neg Hx   . Rectal cancer Neg Hx   . Stomach cancer Neg Hx     Social History Social History  Substance Use Topics  . Smoking status: Never Smoker  . Smokeless  tobacco: Never Used  . Alcohol use No     Comment: quit drinking 7 months     Allergies   Naproxen   Review of Systems Review of Systems  All other systems reviewed and are negative.    Physical Exam Updated Vital Signs BP 93/58 (BP Location: Right Arm)   Pulse 84   Temp 98.2 F (36.8 C) (Oral)   Resp 18   SpO2 100%   Physical Exam  Nursing note and vitals reviewed.  50 year old female, resting comfortably and in no acute distress. Vital signs are normal. Oxygen saturation is 100%, which is normal. Head is normocephalic and atraumatic. PERRLA, EOMI. Oropharynx is clear. Neck: There is a 1 cm area of erythema with some scabbed areas centrally. There is no fluctuance. There is no adenopathy or JVD. Back is nontender and there is no CVA tenderness. Lungs are clear without rales, wheezes, or rhonchi. Chest is nontender. Heart has regular rate and rhythm without murmur. Abdomen is soft, flat, nontender without masses or hepatosplenomegaly and peristalsis is normoactive. Extremities have no cyanosis or edema, full range of motion is present. Skin is warm and dry without rash. Neurologic: Mental status is normal, cranial nerves are intact, there are no motor or sensory deficits.  ED Treatments / Results  Labs (all labs ordered are listed, but only abnormal results are displayed) Results for orders placed or performed during the hospital encounter of 05/19/16  CBC with Differential  Result Value Ref Range   WBC 6.9 4.0 - 10.5 K/uL   RBC 3.52 (L) 3.87 - 5.11 MIL/uL   Hemoglobin 11.2 (L) 12.0 - 15.0 g/dL   HCT 33.8 (L) 36.0 - 46.0 %   MCV 96.0 78.0 - 100.0 fL   MCH 31.8 26.0 - 34.0 pg   MCHC 33.1 30.0 - 36.0 g/dL   RDW 12.5 11.5 - 15.5 %   Platelets 242 150 - 400 K/uL   Neutrophils Relative % 66 %   Neutro Abs 4.6 1.7 - 7.7 K/uL   Lymphocytes Relative 21 %   Lymphs Abs 1.4 0.7 - 4.0 K/uL   Monocytes Relative 11 %   Monocytes Absolute 0.8 0.1 - 1.0 K/uL   Eosinophils  Relative 2 %   Eosinophils Absolute 0.2 0.0 - 0.7 K/uL   Basophils Relative 0 %   Basophils Absolute 0.0 0.0 - 0.1 K/uL  Comprehensive metabolic panel  Result Value Ref Range   Sodium 134 (L) 135 - 145 mmol/L   Potassium 3.7 3.5 - 5.1 mmol/L   Chloride 99 (L) 101 - 111 mmol/L   CO2 26 22 -  32 mmol/L   Glucose, Bld 112 (H) 65 - 99 mg/dL   BUN 12 6 - 20 mg/dL   Creatinine, Ser 0.82 0.44 - 1.00 mg/dL   Calcium 9.3 8.9 - 10.3 mg/dL   Total Protein 6.5 6.5 - 8.1 g/dL   Albumin 3.4 (L) 3.5 - 5.0 g/dL   AST 17 15 - 41 U/L   ALT 13 (L) 14 - 54 U/L   Alkaline Phosphatase 60 38 - 126 U/L   Total Bilirubin 0.6 0.3 - 1.2 mg/dL   GFR calc non Af Amer >60 >60 mL/min   GFR calc Af Amer >60 >60 mL/min   Anion gap 9 5 - 15  Urinalysis, Routine w reflex microscopic (not at [facility])  Result Value Ref Range   Color, Urine YELLOW YELLOW   APPearance CLEAR CLEAR   Specific Gravity, Urine 1.011 1.005 - 1.030   pH 6.0 5.0 - 8.0   Glucose, UA NEGATIVE NEGATIVE mg/dL   Hgb urine dipstick SMALL (A) NEGATIVE   Bilirubin Urine NEGATIVE NEGATIVE   Ketones, ur NEGATIVE NEGATIVE mg/dL   Protein, ur NEGATIVE NEGATIVE mg/dL   Nitrite NEGATIVE NEGATIVE   Leukocytes, UA NEGATIVE NEGATIVE  Urine microscopic-add on  Result Value Ref Range   Squamous Epithelial / LPF 0-5 (A) NONE SEEN   WBC, UA 0-5 0 - 5 WBC/hpf   RBC / HPF 0-5 0 - 5 RBC/hpf   Bacteria, UA RARE (A) NONE SEEN  I-Stat CG4 Lactic Acid, ED  Result Value Ref Range   Lactic Acid, Venous 0.38 (L) 0.5 - 1.9 mmol/L  I-Stat CG4 Lactic Acid, ED  Result Value Ref Range   Lactic Acid, Venous 0.69 0.5 - 1.9 mmol/L    Procedures Procedures (including critical care time) Limited bedside ultrasound Indication: Rule out abscess Location: Right side of neck Findings: No evidence of abscess Images were stored electronically Patient tolerated procedure well  Medications Ordered in ED Medications  doxycycline (VIBRA-TABS) tablet 100 mg (not  administered)     Initial Impression / Assessment and Plan / ED Course  I have reviewed the triage vital signs and the nursing notes.  Pertinent labs that were available during my care of the patient were reviewed by me and considered in my medical decision making (see chart for details).  Clinical Course  Value Comment By Time  Hemoglobin: (!) 11.2 (Reviewed) Delora Fuel, MD [id number]    Patient presented with area suggestive of abscess versus cellulitis. Ultrasound was done to differentiate the 2 and no evidence of abscess was found. She was noted to be febrile on presentation to ED with defervescence with ibuprofen. She does not appear toxic and has normal WBC and normal differential. I believe her fever is unrelated to her rather limited area of cellulitis. Mild anemia is noted which is unchanged from baseline. She is discharged with prescription for doxycycline and follow-up with PCP. Advised to continue using appropriate over-the-counter medications for fever and pain control. Given prescription for tramadol for more severe pain. Return precautions given.  Final Clinical Impressions(s) / ED Diagnoses   Final diagnoses:  Cellulitis of neck  Fever, unspecified fever cause  Normochromic normocytic anemia    New Prescriptions New Prescriptions   DOXYCYCLINE (VIBRAMYCIN) 100 MG CAPSULE    Take 1 capsule (100 mg total) by mouth 2 (two) times daily.   TRAMADOL (ULTRAM) 50 MG TABLET    Take 1 tablet (50 mg total) by mouth every 6 (six) hours as needed.  Delora Fuel, MD [id number]

## 2016-09-11 ENCOUNTER — Emergency Department (HOSPITAL_COMMUNITY): Payer: Medicare HMO

## 2016-09-11 ENCOUNTER — Observation Stay (HOSPITAL_COMMUNITY): Payer: Medicare HMO

## 2016-09-11 ENCOUNTER — Encounter (HOSPITAL_COMMUNITY): Payer: Self-pay | Admitting: Emergency Medicine

## 2016-09-11 ENCOUNTER — Observation Stay (HOSPITAL_COMMUNITY)
Admission: EM | Admit: 2016-09-11 | Discharge: 2016-09-13 | Disposition: A | Discharge status: Home / Self Care | Payer: Medicare HMO | Attending: Nephrology | Admitting: Nephrology

## 2016-09-11 DIAGNOSIS — R2990 Unspecified symptoms and signs involving the nervous system: Secondary | ICD-10-CM

## 2016-09-11 DIAGNOSIS — R471 Dysarthria and anarthria: Secondary | ICD-10-CM

## 2016-09-11 DIAGNOSIS — F1020 Alcohol dependence, uncomplicated: Secondary | ICD-10-CM | POA: Insufficient documentation

## 2016-09-11 DIAGNOSIS — K219 Gastro-esophageal reflux disease without esophagitis: Secondary | ICD-10-CM | POA: Diagnosis not present

## 2016-09-11 DIAGNOSIS — G40909 Epilepsy, unspecified, not intractable, without status epilepticus: Secondary | ICD-10-CM | POA: Diagnosis not present

## 2016-09-11 DIAGNOSIS — M8580 Other specified disorders of bone density and structure, unspecified site: Secondary | ICD-10-CM | POA: Insufficient documentation

## 2016-09-11 DIAGNOSIS — G43909 Migraine, unspecified, not intractable, without status migrainosus: Secondary | ICD-10-CM | POA: Diagnosis not present

## 2016-09-11 DIAGNOSIS — R569 Unspecified convulsions: Secondary | ICD-10-CM | POA: Diagnosis not present

## 2016-09-11 DIAGNOSIS — E039 Hypothyroidism, unspecified: Secondary | ICD-10-CM | POA: Diagnosis not present

## 2016-09-11 DIAGNOSIS — E785 Hyperlipidemia, unspecified: Secondary | ICD-10-CM | POA: Diagnosis not present

## 2016-09-11 DIAGNOSIS — H538 Other visual disturbances: Secondary | ICD-10-CM | POA: Insufficient documentation

## 2016-09-11 DIAGNOSIS — R4781 Slurred speech: Principal | ICD-10-CM | POA: Insufficient documentation

## 2016-09-11 DIAGNOSIS — Z886 Allergy status to analgesic agent status: Secondary | ICD-10-CM | POA: Insufficient documentation

## 2016-09-11 DIAGNOSIS — F332 Major depressive disorder, recurrent severe without psychotic features: Secondary | ICD-10-CM

## 2016-09-11 DIAGNOSIS — F314 Bipolar disorder, current episode depressed, severe, without psychotic features: Secondary | ICD-10-CM | POA: Diagnosis not present

## 2016-09-11 DIAGNOSIS — I639 Cerebral infarction, unspecified: Secondary | ICD-10-CM

## 2016-09-11 DIAGNOSIS — R200 Anesthesia of skin: Secondary | ICD-10-CM | POA: Insufficient documentation

## 2016-09-11 DIAGNOSIS — I959 Hypotension, unspecified: Secondary | ICD-10-CM | POA: Diagnosis present

## 2016-09-11 DIAGNOSIS — F419 Anxiety disorder, unspecified: Secondary | ICD-10-CM | POA: Insufficient documentation

## 2016-09-11 DIAGNOSIS — Z981 Arthrodesis status: Secondary | ICD-10-CM | POA: Insufficient documentation

## 2016-09-11 DIAGNOSIS — F319 Bipolar disorder, unspecified: Secondary | ICD-10-CM | POA: Diagnosis present

## 2016-09-11 LAB — CBC
HEMATOCRIT: 36.4 % (ref 36.0–46.0)
Hemoglobin: 12.3 g/dL (ref 12.0–15.0)
MCH: 31.9 pg (ref 26.0–34.0)
MCHC: 33.8 g/dL (ref 30.0–36.0)
MCV: 94.3 fL (ref 78.0–100.0)
Platelets: 185 10*3/uL (ref 150–400)
RBC: 3.86 MIL/uL — ABNORMAL LOW (ref 3.87–5.11)
RDW: 13.3 % (ref 11.5–15.5)
WBC: 8.2 10*3/uL (ref 4.0–10.5)

## 2016-09-11 LAB — DIFFERENTIAL
BASOS ABS: 0 10*3/uL (ref 0.0–0.1)
BASOS PCT: 0 %
Eosinophils Absolute: 0.5 10*3/uL (ref 0.0–0.7)
Eosinophils Relative: 6 %
Lymphocytes Relative: 29 %
Lymphs Abs: 2.4 10*3/uL (ref 0.7–4.0)
MONOS PCT: 5 %
Monocytes Absolute: 0.4 10*3/uL (ref 0.1–1.0)
NEUTROS ABS: 5 10*3/uL (ref 1.7–7.7)
Neutrophils Relative %: 60 %

## 2016-09-11 LAB — HCG, QUANTITATIVE, PREGNANCY: hCG, Beta Chain, Quant, S: <1 m[IU]/mL (ref <5)

## 2016-09-11 LAB — GLUCOSE, CAPILLARY: Glucose-Capillary: 96 mg/dL (ref 65–99)

## 2016-09-11 LAB — CBG MONITORING, ED: GLUCOSE-CAPILLARY: 81 mg/dL (ref 65–99)

## 2016-09-11 LAB — PROTIME-INR
INR: 0.88
INR: 0.94
PROTHROMBIN TIME: 12.5 s (ref 11.4–15.2)
Prothrombin Time: 12 seconds (ref 11.4–15.2)

## 2016-09-11 LAB — COMPREHENSIVE METABOLIC PANEL
ALT: 15 U/L (ref 14–54)
AST: 24 U/L (ref 15–41)
Albumin: 4.1 g/dL (ref 3.5–5.0)
Alkaline Phosphatase: 50 U/L (ref 38–126)
Anion gap: 6 (ref 5–15)
BUN: 13 mg/dL (ref 6–20)
CHLORIDE: 102 mmol/L (ref 101–111)
CO2: 30 mmol/L (ref 22–32)
CREATININE: 0.89 mg/dL (ref 0.44–1.00)
Calcium: 9.4 mg/dL (ref 8.9–10.3)
GFR calc Af Amer: >60 mL/min (ref >60)
Glucose, Bld: 94 mg/dL (ref 65–99)
Potassium: 3.9 mmol/L (ref 3.5–5.1)
SODIUM: 138 mmol/L (ref 135–145)
Total Bilirubin: 0.4 mg/dL (ref 0.3–1.2)
Total Protein: 6.2 g/dL — ABNORMAL LOW (ref 6.5–8.1)

## 2016-09-11 LAB — I-STAT CHEM 8, ED
BUN: 14 mg/dL (ref 6–20)
CHLORIDE: 99 mmol/L — AB (ref 101–111)
CREATININE: 0.9 mg/dL (ref 0.44–1.00)
Calcium, Ion: 1.15 mmol/L (ref 1.15–1.40)
GLUCOSE: 88 mg/dL (ref 65–99)
HCT: 38 % (ref 36.0–46.0)
Hemoglobin: 12.9 g/dL (ref 12.0–15.0)
POTASSIUM: 3.9 mmol/L (ref 3.5–5.1)
Sodium: 138 mmol/L (ref 135–145)
TCO2: 29 mmol/L (ref 0–100)

## 2016-09-11 LAB — APTT: APTT: 29 s (ref 24–36)

## 2016-09-11 LAB — I-STAT TROPONIN, ED: Troponin i, poc: 0 ng/mL (ref 0.00–0.08)

## 2016-09-11 LAB — ETHANOL

## 2016-09-11 MED ORDER — ADULT MULTIVITAMIN W/MINERALS CH
1.0000 | ORAL_TABLET | Freq: Every day | ORAL | Status: DC
  Administered 2016-09-11 – 2016-09-13 (×3): 1 via ORAL
  Filled 2016-09-11 (×3): qty 1

## 2016-09-11 MED ORDER — LORAZEPAM 2 MG/ML IJ SOLN
1.0000 mg | INTRAMUSCULAR | Status: DC | PRN
Start: 2016-09-11 — End: 2016-09-13

## 2016-09-11 MED ORDER — LORAZEPAM 2 MG/ML IJ SOLN: 0.0000 mg | Freq: Four times a day (QID) | INTRAMUSCULAR | Status: DC

## 2016-09-11 MED ORDER — FOLIC ACID 1 MG PO TABS
1.0000 mg | ORAL_TABLET | Freq: Every day | ORAL | Status: DC
  Administered 2016-09-11 – 2016-09-13 (×3): 1 mg via ORAL
  Filled 2016-09-11 (×3): qty 1

## 2016-09-11 MED ORDER — METOCLOPRAMIDE HCL 5 MG/ML IJ SOLN
10.0000 mg | Freq: Once | INTRAMUSCULAR | Status: AC
  Administered 2016-09-11: 10 mg via INTRAVENOUS
  Filled 2016-09-11: qty 2

## 2016-09-11 MED ORDER — VITAMIN B-12 1000 MCG PO TABS
1000.0000 ug | ORAL_TABLET | Freq: Every day | ORAL | Status: DC
  Administered 2016-09-12 – 2016-09-13 (×2): 1000 ug via ORAL
  Filled 2016-09-11 (×2): qty 1

## 2016-09-11 MED ORDER — LORAZEPAM 1 MG PO TABS: 1.0000 mg | ORAL_TABLET | Freq: Four times a day (QID) | ORAL | Status: DC | PRN

## 2016-09-11 MED ORDER — LEVOTHYROXINE SODIUM 25 MCG PO TABS: 25.0000 ug | ORAL_TABLET | Freq: Every day | ORAL | Status: DC

## 2016-09-11 MED ORDER — SENNOSIDES-DOCUSATE SODIUM 8.6-50 MG PO TABS
1.0000 | ORAL_TABLET | Freq: Every evening | ORAL | Status: DC | PRN
  Filled 2016-09-11: qty 1

## 2016-09-11 MED ORDER — VITAMIN D 1000 UNITS PO TABS
1000.0000 [IU] | ORAL_TABLET | Freq: Every day | ORAL | Status: DC
  Administered 2016-09-12 – 2016-09-13 (×2): 1000 [IU] via ORAL
  Filled 2016-09-11 (×2): qty 1

## 2016-09-11 MED ORDER — LORAZEPAM 2 MG/ML IJ SOLN: 1.0000 mg | Freq: Four times a day (QID) | INTRAMUSCULAR | Status: DC | PRN

## 2016-09-11 MED ORDER — STROKE: EARLY STAGES OF RECOVERY BOOK
Freq: Once | Status: AC
  Administered 2016-09-11: 22:00:00
  Filled 2016-09-11: qty 1

## 2016-09-11 MED ORDER — VITAMIN B-1 100 MG PO TABS
100.0000 mg | ORAL_TABLET | Freq: Every day | ORAL | Status: DC
  Administered 2016-09-11 – 2016-09-13 (×3): 100 mg via ORAL
  Filled 2016-09-11 (×3): qty 1

## 2016-09-11 MED ORDER — LORAZEPAM 2 MG/ML IJ SOLN: 0.0000 mg | Freq: Two times a day (BID) | INTRAMUSCULAR | Status: DC

## 2016-09-11 MED ORDER — SODIUM CHLORIDE 0.9 % IV SOLN
INTRAVENOUS | Status: DC
  Administered 2016-09-11: 1000 mL via INTRAVENOUS
  Administered 2016-09-12 – 2016-09-13 (×3): via INTRAVENOUS

## 2016-09-11 MED ORDER — ASPIRIN EC 81 MG PO TBEC
81.0000 mg | DELAYED_RELEASE_TABLET | Freq: Every day | ORAL | Status: DC
  Administered 2016-09-12 – 2016-09-13 (×2): 81 mg via ORAL
  Filled 2016-09-11 (×2): qty 1

## 2016-09-11 MED ORDER — THIAMINE HCL 100 MG/ML IJ SOLN
100.0000 mg | Freq: Every day | INTRAMUSCULAR | Status: DC
  Filled 2016-09-11: qty 2

## 2016-09-11 MED ORDER — LURASIDONE HCL 40 MG PO TABS
40.0000 mg | ORAL_TABLET | Freq: Every day | ORAL | Status: DC
  Administered 2016-09-12 – 2016-09-13 (×2): 40 mg via ORAL
  Filled 2016-09-11 (×2): qty 1

## 2016-09-11 MED ORDER — ACETAMINOPHEN 325 MG PO TABS
650.0000 mg | ORAL_TABLET | Freq: Four times a day (QID) | ORAL | Status: DC | PRN
Start: 2016-09-11 — End: 2016-09-13
  Administered 2016-09-11 – 2016-09-12 (×2): 650 mg via ORAL
  Filled 2016-09-11 (×2): qty 2

## 2016-09-11 MED ORDER — ENOXAPARIN SODIUM 40 MG/0.4ML ~~LOC~~ SOLN
40.0000 mg | SUBCUTANEOUS | Status: DC
  Administered 2016-09-12 – 2016-09-13 (×2): 40 mg via SUBCUTANEOUS
  Filled 2016-09-11 (×2): qty 0.4

## 2016-09-11 MED ORDER — ACETAMINOPHEN 500 MG PO TABS
1000.0000 mg | ORAL_TABLET | Freq: Once | ORAL | Status: AC
  Administered 2016-09-11: 1000 mg via ORAL
  Filled 2016-09-11: qty 2

## 2016-09-11 MED ORDER — IBUPROFEN 400 MG PO TABS: 600.0000 mg | ORAL_TABLET | Freq: Four times a day (QID) | ORAL | Status: DC | PRN

## 2016-09-11 MED ORDER — AMPHETAMINE-DEXTROAMPHETAMINE 10 MG PO TABS
20.0000 mg | ORAL_TABLET | Freq: Every day | ORAL | Status: DC
  Administered 2016-09-12 – 2016-09-13 (×2): 20 mg via ORAL
  Filled 2016-09-11 (×2): qty 2

## 2016-09-11 MED ORDER — DIPHENHYDRAMINE HCL 50 MG/ML IJ SOLN
25.0000 mg | Freq: Once | INTRAMUSCULAR | Status: AC
  Administered 2016-09-11: 25 mg via INTRAVENOUS
  Filled 2016-09-11: qty 1

## 2016-09-11 MED ORDER — TRAZODONE HCL 100 MG PO TABS
100.0000 mg | ORAL_TABLET | Freq: Every evening | ORAL | Status: DC | PRN
  Administered 2016-09-11 – 2016-09-13 (×2): 100 mg via ORAL
  Filled 2016-09-11 (×2): qty 1

## 2016-09-11 MED ORDER — LAMOTRIGINE 100 MG PO TABS
100.0000 mg | ORAL_TABLET | Freq: Every morning | ORAL | Status: DC
  Administered 2016-09-12 – 2016-09-13 (×2): 100 mg via ORAL
  Filled 2016-09-11 (×2): qty 1

## 2016-09-11 MED ORDER — OXYCODONE HCL 5 MG PO TABS
10.0000 mg | ORAL_TABLET | ORAL | Status: DC | PRN
  Administered 2016-09-12 – 2016-09-13 (×6): 10 mg via ORAL
  Filled 2016-09-11 (×6): qty 2

## 2016-09-11 NOTE — ED Provider Notes (Signed)
[facility] DEPT Provider Note   CSN: [id number] Arrival date & time: 09/11/16  1950     History   Chief Complaint Chief Complaint  Patient presents with  . Headache    HPI [name] is a 50 y.o. female.  Patient presents from home due to right sided headache blurry vision slurred speech and right facial numbness. Last normal approximate 5:00. No history of stroke. Patient does have a history of anxiety and bipolar. This feels different than her normal migraine headaches. Code stroke was called.      Past Medical History:  Diagnosis Date  . Allergy   . Anxiety   . Bipolar disorder ([place])   . DDD (degenerative disc disease)   . Depression   . HSV infection   . Hx: UTI (urinary tract infection)   . Pertussis   . Seizures ([place])    last seizure 12/2014   . Thyroid disease     Patient Active Problem List   Diagnosis Date Noted  . Right facial numbness 09/11/2016  . Fall   . Syncope 11/20/2015  . Scalp laceration 11/20/2015  . 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 DDD (degenerative disc disease)   . ACUTE BRONCHITIS 10/22/2009  . GERD 08/02/2009  . DEGENERATIVE DISC DISEASE 08/02/2009  . COUGH 08/02/2009  . BIPOLAR AFFECTIVE DISORDER 08/01/2009  . ALCOHOLISM 08/01/2009  . BULIMIA 08/01/2009  . HEARING LOSS, BILATERAL 08/01/2009  . OSTEOPENIA 08/01/2009  . SEIZURE DISORDER 08/01/2009    Past Surgical History:  Procedure Laterality Date  . CERVICAL SPINE SURGERY     x 3 2001,2005,2007 c5-6  . CESAREAN SECTION  2003  . TONSILLECTOMY    . TUBAL LIGATION    . WISDOM TOOTH EXTRACTION      OB History    No data available       Home Medications    Prior to Admission medications   Medication Sig Start Date End Date Taking? Authorizing Provider  acetaminophen (TYLENOL) 500 MG tablet Take 500 mg by mouth every 6 (six) hours as needed for mild pain.    Historical Provider, MD    amphetamine-dextroamphetamine (ADDERALL) 20 MG tablet Take 20 mg by mouth daily.    Historical Provider, MD  cholecalciferol (VITAMIN D) 1000 units tablet Take 1,000 Units by mouth daily.    Historical Provider, MD  doxycycline (VIBRAMYCIN) 100 MG capsule Take 1 capsule (100 mg total) by mouth 2 (two) times daily. [id number]   Delora Fuel, MD  ibuprofen (ADVIL,MOTRIN) 600 MG tablet Take 600 mg by mouth every 6 (six) hours as needed for moderate pain.     Historical Provider, MD  lamoTRIgine (LAMICTAL) 100 MG tablet Take 100 mg by mouth every morning. 08/02/15   Historical Provider, MD  levothyroxine (SYNTHROID, LEVOTHROID) 25 MCG tablet Take 1 tablet (25 mcg total) by mouth daily before breakfast. 03/28/15   Kerrie Buffalo, NP  lurasidone (LATUDA) 40 MG TABS tablet Take 40 mg by mouth daily with breakfast.    Historical Provider, MD  oxyCODONE (OXY IR/ROXICODONE) 5 MG immediate release tablet Take 10 mg by mouth every 6 (six) hours.    Historical Provider, MD  traMADol (ULTRAM) 50 MG tablet Take 1 tablet (50 mg total) by mouth every 6 (six) hours as needed. [id number]   Delora Fuel, MD  traZODone (DESYREL) 100 MG tablet Take 1 tablet (100 mg total) by mouth at bedtime as needed for sleep. 11/22/15   Reyne Dumas, MD  vitamin B-12 (CYANOCOBALAMIN) 1000 MCG tablet Take 1,000 mcg by mouth daily.    Historical Provider, MD    Family History Family History  Problem Relation Age of Onset  . Diabetes Mellitus II Neg Hx   . CAD Neg Hx   . Colon cancer Neg Hx   . Colon polyps Neg Hx   . Heart disease Neg Hx   . Rectal cancer Neg Hx   . Stomach cancer Neg Hx     Social History Social History  Substance Use Topics  . Smoking status: Never Smoker  . Smokeless tobacco: Never Used  . Alcohol use No     Comment: quit drinking 7 months     Allergies   Naproxen   Review of Systems Review of Systems  Constitutional: Negative for chills and fever.  HENT: Negative for congestion.   Eyes: Negative for  visual disturbance.  Respiratory: Negative for shortness of breath.   Cardiovascular: Negative for chest pain.  Gastrointestinal: Negative for abdominal pain and vomiting.  Genitourinary: Negative for dysuria and flank pain.  Musculoskeletal: Negative for back pain, neck pain and neck stiffness.  Skin: Negative for rash.  Neurological: Positive for speech difficulty, numbness and headaches. Negative for light-headedness.     Physical Exam Updated Vital Signs BP (!) 96/54   Pulse 90   Temp 98.5 F (36.9 C) (Oral)   Resp 15   Ht 5\' 1"  (1.549 m)   Wt 120 lb (54.4 kg)   LMP 08/19/2016   SpO2 100%   BMI 22.67 kg/m   Physical Exam  Constitutional: She appears well-developed and well-nourished. No distress.  HENT:  Head: Normocephalic and atraumatic.  Eyes: Conjunctivae are normal.  Neck: Neck supple.  Cardiovascular: Normal rate and regular rhythm.   No murmur heard. Pulmonary/Chest: Effort normal and breath sounds normal. No respiratory distress.  Abdominal: Soft. There is no tenderness.  Musculoskeletal: She exhibits no edema.  Neurological: She is alert.  5+ strength in UE and LE with f/e at major joints. Sensation to palpation intact in UE and LE. CNs 2-12 grossly intact.  EOMFI.  PERRL.   Finger nose and coordination intact bilateral.   Visual fields intact to finger testing. No nystagmus   Skin: Skin is warm and dry.  Psychiatric: She has a normal mood and affect.  Nursing note and vitals reviewed.    ED Treatments / Results  Labs (all labs ordered are listed, but only abnormal results are displayed) Labs Reviewed  CBC - Abnormal; Notable for the following:       Result Value   RBC 3.86 (*)    All other components within normal limits  COMPREHENSIVE METABOLIC PANEL - Abnormal; Notable for the following:    Total Protein 6.2 (*)    All other components within normal limits  I-STAT CHEM 8, ED - Abnormal; Notable for the following:    Chloride 99 (*)    All  other components within normal limits  ETHANOL  PROTIME-INR  APTT  DIFFERENTIAL  RAPID URINE DRUG SCREEN, HOSP PERFORMED  URINALYSIS, ROUTINE W REFLEX MICROSCOPIC (NOT AT [facility])  Randolm Idol, ED  CBG MONITORING, ED    EKG  EKG Interpretation  Date/Time:  Friday September 11 2016 20:25:14 EST Ventricular Rate:  84 PR Interval:    QRS Duration: 80 QT Interval:  384 QTC Calculation: 454 R Axis:   76 Text Interpretation:  Sinus rhythm Borderline T abnormalities, anterior leads Confirmed by Reather Converse MD, Garlan Drewes [phone]) on 09/11/2016 9:13:33  PM       Radiology Ct Head Code Stroke Wo Contrast  Addendum Date: 09/11/2016   ADDENDUM REPORT: 09/11/2016 20:18 ADDENDUM: These results were called by telephone at the time of interpretation on 09/11/2016 at 8:18 pm to Dr. Cheral Marker, who verbally acknowledged these results. Electronically Signed   By: Kristine Garbe M.D.   On: 09/11/2016 20:18   Result Date: 09/11/2016 CLINICAL DATA:  Code stroke. Sudden onset headache and right eye blurred vision. EXAM: CT HEAD WITHOUT CONTRAST TECHNIQUE: Contiguous axial images were obtained from the base of the skull through the vertex without intravenous contrast. COMPARISON:  11/19/2015 CT head. FINDINGS: Brain: No evidence of acute infarction, hemorrhage, hydrocephalus, extra-axial collection or mass lesion/mass effect. Vascular: No hyperdense vessel or unexpected calcification. Skull: Normal. Negative for fracture or focal lesion. Sinuses/Orbits: Mild mucosal thickening of ethmoid air cells. Otherwise the visualized paranasal sinuses and mastoid air cells are normally aerated. Orbits are unremarkable. Other: None. ASPECTS [facility] Stroke Program Early CT Score) - Ganglionic level infarction (caudate, lentiform nuclei, internal capsule, insula, M1-M3 cortex): 7 - Supraganglionic infarction (M4-M6 cortex): 3 Total score (0-10 with 10 being normal): 10 IMPRESSION: 1. No acute intracranial abnormality  identified. If symptoms persist or if clinically indicated MRI is more sensitive for acute stroke. 2. ASPECTS is 10 Electronically Signed: By: Kristine Garbe M.D. On: 09/11/2016 20:11    Procedures Procedures (including critical care time)  Medications Ordered in ED Medications  acetaminophen (TYLENOL) tablet 1,000 mg (not administered)  metoCLOPramide (REGLAN) injection 10 mg (not administered)  diphenhydrAMINE (BENADRYL) injection 25 mg (not administered)     Initial Impression / Assessment and Plan / ED Course  I have reviewed the triage vital signs and the nursing notes.  Pertinent labs & imaging results that were available during my care of the patient were reviewed by me and considered in my medical decision making (see chart for details).  Clinical Course    Patient presents with EMS for code stroke. Patient does have some unilateral symptoms however also has atypical findings such as migraine. Patient had CT scan of his. neurology evaluated and recommended mri/observation. discussed with tried hospitalist for observation until mri results.  The patients results and plan were reviewed and discussed.   Any x-rays performed were independently reviewed by myself.   Differential diagnosis were considered with the presenting HPI.  Medications  acetaminophen (TYLENOL) tablet 1,000 mg (not administered)  metoCLOPramide (REGLAN) injection 10 mg (not administered)  diphenhydrAMINE (BENADRYL) injection 25 mg (not administered)    Vitals:   09/11/16 2015 09/11/16 2020 09/11/16 2029 09/11/16 2100  BP:   108/55 (!) 96/54  Pulse:   93 90  Resp:   15   Temp:   98.5 F (36.9 C)   TempSrc:   Oral   SpO2: 98%  100% 100%  Weight:  120 lb (54.4 kg)    Height:  5\' 1"  (1.549 m)      Final diagnoses:  Stroke [facility])    Admission/ observation were discussed with the admitting physician, patient and/or family and they are comfortable with the plan.   Final Clinical  Impressions(s) / ED Diagnoses   Final diagnoses:  Stroke [facility])    New Prescriptions New Prescriptions   No medications on file     Elnora Morrison, MD 09/11/16 2134

## 2016-09-11 NOTE — ED Notes (Signed)
Cancelled Code Stroke per Garlon Hatchet

## 2016-09-11 NOTE — H&P (Addendum)
History and Physical    [name] [id number] DOB: 1966-05-22 DOA: 09/11/2016  Referring MD/NP/PA:   PCP: Sandi Mariscal, MD   Patient coming from:  The patient is coming from home.  At baseline, pt is independent for most of ADL.      Chief Complaint: Headache, blurry vision, slurred speech, right facial numbness  HPI: [name] is a 50 y.o. female with medical history significant of seizure, bipolar disorder, depression, anxiety, alcoholism, who presents with headache, blurry vision, slurred speech, right facial numbness.    Pt states that she has sudden onset of headache, blurry vision, slurred speech and right facial numbness at about 4:30 to 5:00 PM. She does not have weakness in extremities. No hearing loss. Her right arm weakness and blurry vision have subsided, but continues to have right-sided headache and slurred speech in ED. Her headache is located in the right side of the head, constant, dull, 7 out of 10 in severity, nonradiating. No neck stiffness. She had mild chest heaviness earlier, which has resolved completely. No cough, shortness of breath, fever or chills. Patient denies nausea, vomiting, abdominal pain, diarrhea, symptoms of UTI. She denies drinking alcohol, but she has alcoholism listed in her medical problems.  ED Course: pt was found to have WBC 8.2, negative troponin, electrolytes renal function okay, alcohol level less than 5, temperature normal, oxygen saturation 100% on room air, negative CT head for acute intracranial abnormalities. Patient is placed on telemetry bed for observation. Neurology was consulted.  Review of Systems:   General: no fevers, chills, no changes in body weight, has fatigue. Has HA. HEENT: no blurry vision, hearing changes or sore throat Respiratory: no dyspnea, coughing, wheezing CV: no chest pain, no palpitations GI: no nausea, vomiting, abdominal pain, diarrhea, constipation GU: no dysuria, burning on urination,  increased urinary frequency, hematuria  Ext: no leg edema Neuro: has blurry vision, slurred speech, right facial numbness. No unilateral weakness in extremities Skin: no rash, no skin tear. MSK: No muscle spasm, no deformity, no limitation of range of movement in spin Heme: No easy bruising.  Travel history: No recent long distant travel.  Allergy:  Allergies  Allergen Reactions  . Naproxen Anaphylaxis    Past Medical History:  Diagnosis Date  . Allergy   . Anxiety   . Bipolar disorder ([place])   . DDD (degenerative disc disease)   . Depression   . HSV infection   . Hx: UTI (urinary tract infection)   . Pertussis   . Seizures ([place])    last seizure 12/2014   . Thyroid disease     Past Surgical History:  Procedure Laterality Date  . CERVICAL SPINE SURGERY     x 3 2001,2005,2007 c5-6  . CESAREAN SECTION  2003  . TONSILLECTOMY    . TUBAL LIGATION    . WISDOM TOOTH EXTRACTION      Social History:  reports that she has never smoked. She has never used smokeless tobacco. She reports that she does not drink alcohol or use drugs.  Family History:  Family History  Problem Relation Age of Onset  . Diabetes Mellitus II Neg Hx   . CAD Neg Hx   . Colon cancer Neg Hx   . Colon polyps Neg Hx   . Heart disease Neg Hx   . Rectal cancer Neg Hx   . Stomach cancer Neg Hx      Prior to Admission medications   Medication Sig Start Date End Date Taking?  Authorizing Provider  acetaminophen (TYLENOL) 500 MG tablet Take 500 mg by mouth every 6 (six) hours as needed for mild pain.    Historical Provider, MD  amphetamine-dextroamphetamine (ADDERALL) 20 MG tablet Take 20 mg by mouth daily.    Historical Provider, MD  cholecalciferol (VITAMIN D) 1000 units tablet Take 1,000 Units by mouth daily.    Historical Provider, MD  doxycycline (VIBRAMYCIN) 100 MG capsule Take 1 capsule (100 mg total) by mouth 2 (two) times daily. [id number]   Delora Fuel, MD  ibuprofen (ADVIL,MOTRIN) 600 MG tablet Take  600 mg by mouth every 6 (six) hours as needed for moderate pain.     Historical Provider, MD  lamoTRIgine (LAMICTAL) 100 MG tablet Take 100 mg by mouth every morning. 08/02/15   Historical Provider, MD  levothyroxine (SYNTHROID, LEVOTHROID) 25 MCG tablet Take 1 tablet (25 mcg total) by mouth daily before breakfast. 03/28/15   Kerrie Buffalo, NP  lurasidone (LATUDA) 40 MG TABS tablet Take 40 mg by mouth daily with breakfast.    Historical Provider, MD  oxyCODONE (OXY IR/ROXICODONE) 5 MG immediate release tablet Take 10 mg by mouth every 6 (six) hours.    Historical Provider, MD  traMADol (ULTRAM) 50 MG tablet Take 1 tablet (50 mg total) by mouth every 6 (six) hours as needed. [id number]   Delora Fuel, MD  traZODone (DESYREL) 100 MG tablet Take 1 tablet (100 mg total) by mouth at bedtime as needed for sleep. 11/22/15   Reyne Dumas, MD  vitamin B-12 (CYANOCOBALAMIN) 1000 MCG tablet Take 1,000 mcg by mouth daily.    Historical Provider, MD    Physical Exam: Vitals:   09/12/16 0406 09/12/16 0408 09/12/16 0417 09/12/16 0419  BP: (!) 81/47 (!) 82/61 (!) 82/52 (!) 82/52  Pulse: 73     Resp:  12 12   Temp: 97.7 F (36.5 C)     TempSrc: Oral     SpO2: 99%     Weight:      Height:       General: Not in acute distress. pt is anxious, dry mucus and membrane. HEENT:       Eyes: PERRL, EOMI, no scleral icterus.       ENT: No discharge from the ears and nose, no pharynx injection, no tonsillar enlargement.        Neck: No JVD, no bruit, no mass felt. Heme: No neck lymph node enlargement. Cardiac: S1/S2, RRR, No murmurs, No gallops or rubs. Respiratory: No rales, wheezing, rhonchi or rubs. GI: Soft, nondistended, nontender, no rebound pain, no organomegaly, BS present. GU: No hematuria Ext: No pitting leg edema bilaterally. 2+DP/PT pulse bilaterally. Musculoskeletal: No joint deformities, No joint redness or warmth, no limitation of ROM in spin. Skin: No rashes.  Neuro: Alert, oriented X3, has slurry  speech, cranial nerves II-XII grossly intact, moves all extremities normally. Muscle strength 5/5 in all extremities, sensation to light touch intact. Brachial reflex 2+ bilaterally. Negative Babinski's sign.  Psych: Patient is not psychotic, no suicidal or hemocidal ideation.  Labs on Admission: I have personally reviewed following labs and imaging studies  CBC:  Recent Labs Lab 09/11/16 1953 09/11/16 2000  WBC 8.2  --   NEUTROABS 5.0  --   HGB 12.3 12.9  HCT 36.4 38.0  MCV 94.3  --   PLT 185  --    Basic Metabolic Panel:  Recent Labs Lab 09/11/16 1953 09/11/16 2000  NA 138 138  K 3.9 3.9  CL 102 99*  CO2 30  --   GLUCOSE 94 88  BUN 13 14  CREATININE 0.89 0.90  CALCIUM 9.4  --    GFR: Estimated Creatinine Clearance: 55.8 mL/min (by C-G formula based on SCr of 0.9 mg/dL). Liver Function Tests:  Recent Labs Lab 09/11/16 1953  AST 24  ALT 15  ALKPHOS 50  BILITOT 0.4  PROT 6.2*  ALBUMIN 4.1   No results for input(s): LIPASE, AMYLASE in the last 168 hours. No results for input(s): AMMONIA in the last 168 hours. Coagulation Profile:  Recent Labs Lab 09/11/16 1953 09/11/16 2257  INR 0.88 0.94   Cardiac Enzymes: No results for input(s): CKTOTAL, CKMB, CKMBINDEX, TROPONINI in the last 168 hours. BNP (last 3 results) No results for input(s): PROBNP in the last 8760 hours. HbA1C: No results for input(s): HGBA1C in the last 72 hours. CBG:  Recent Labs Lab 09/11/16 2016 09/11/16 2314  GLUCAP 81 96   Lipid Profile: No results for input(s): CHOL, HDL, LDLCALC, TRIG, CHOLHDL, LDLDIRECT in the last 72 hours. Thyroid Function Tests: No results for input(s): TSH, T4TOTAL, FREET4, T3FREE, THYROIDAB in the last 72 hours. Anemia Panel: No results for input(s): VITAMINB12, FOLATE, FERRITIN, TIBC, IRON, RETICCTPCT in the last 72 hours. Urine analysis:    Component Value Date/Time   COLORURINE YELLOW 05/18/2016 2019   APPEARANCEUR CLEAR 05/18/2016 2019    LABSPEC 1.011 05/18/2016 2019   PHURINE 6.0 05/18/2016 2019   GLUCOSEU NEGATIVE 05/18/2016 2019   HGBUR SMALL (A) 05/18/2016 2019   BILIRUBINUR NEGATIVE 05/18/2016 2019   KETONESUR NEGATIVE 05/18/2016 2019   PROTEINUR NEGATIVE 05/18/2016 2019   UROBILINOGEN 0.2 05/26/2015 0722   NITRITE NEGATIVE 05/18/2016 2019   LEUKOCYTESUR NEGATIVE 05/18/2016 2019   Sepsis Labs: @LABRCNTIP (procalcitonin:4,lacticidven:4) )No results found for this or any previous visit (from the past 240 hour(s)).   Radiological Exams on Admission: Dg Chest 2 View  Result Date: 09/11/2016 CLINICAL DATA:  Acute onset of stroke-like symptoms. Initial encounter. EXAM: CHEST  2 VIEW COMPARISON:  Chest radiograph performed 11/19/2015 FINDINGS: The lungs are well-aerated and clear. There is no evidence of focal opacification, pleural effusion or pneumothorax. The heart is normal in size; the mediastinal contour is within normal limits. No acute osseous abnormalities are seen. Cervical spinal fusion hardware is partially imaged. IMPRESSION: No acute cardiopulmonary process seen. Electronically Signed   By: Garald Balding M.D.   On: 09/11/2016 22:36   Mr Brain Wo Contrast  Result Date: 09/12/2016 CLINICAL DATA:  Headache, blurry vision and right facial numbness. EXAM: MRI HEAD WITHOUT CONTRAST MRA HEAD WITHOUT CONTRAST TECHNIQUE: Multiplanar, multiecho pulse sequences of the brain and surrounding structures were obtained without intravenous contrast. Angiographic images of the head were obtained using MRA technique without contrast. COMPARISON:  Head CT 09/11/2016 FINDINGS: MRI HEAD FINDINGS Brain: No acute infarct or intraparenchymal hemorrhage. The midline structures are normal. No focal parenchymal signal abnormality. No mass lesion or midline shift. No hydrocephalus or extra-axial fluid collection. No age advanced or lobar predominant atrophy. Vascular: Major intracranial arterial and venous sinus flow voids are preserved. No  evidence of chronic microhemorrhage or amyloid angiopathy. Skull and upper cervical spine: The visualized skull base, calvarium, upper cervical spine and extracranial soft tissues are normal. Sinuses/Orbits: No fluid levels or advanced mucosal thickening. No mastoid effusion. Normal orbits. MRA HEAD FINDINGS Intracranial internal carotid arteries: Normal. Anterior cerebral arteries: Normal. Middle cerebral arteries: Normal. Posterior communicating arteries: Present on the right. Posterior cerebral arteries: Normal. Basilar artery: Normal. Vertebral arteries: Codominant. Normal.  Superior cerebellar arteries: Normal. Anterior inferior cerebellar arteries: Not visualized, which is not uncommon. Posterior inferior cerebellar arteries: Normal. IMPRESSION: 1. Normal MRI of the brain for age. 2. Normal MRA of the circle of Willis and intracranial arteries. Electronically Signed   By: Ulyses Jarred M.D.   On: 09/12/2016 02:12   Mr Jodene Nam Head/brain [id number] Cm  Result Date: 09/12/2016 CLINICAL DATA:  Headache, blurry vision and right facial numbness. EXAM: MRI HEAD WITHOUT CONTRAST MRA HEAD WITHOUT CONTRAST TECHNIQUE: Multiplanar, multiecho pulse sequences of the brain and surrounding structures were obtained without intravenous contrast. Angiographic images of the head were obtained using MRA technique without contrast. COMPARISON:  Head CT 09/11/2016 FINDINGS: MRI HEAD FINDINGS Brain: No acute infarct or intraparenchymal hemorrhage. The midline structures are normal. No focal parenchymal signal abnormality. No mass lesion or midline shift. No hydrocephalus or extra-axial fluid collection. No age advanced or lobar predominant atrophy. Vascular: Major intracranial arterial and venous sinus flow voids are preserved. No evidence of chronic microhemorrhage or amyloid angiopathy. Skull and upper cervical spine: The visualized skull base, calvarium, upper cervical spine and extracranial soft tissues are normal. Sinuses/Orbits: No  fluid levels or advanced mucosal thickening. No mastoid effusion. Normal orbits. MRA HEAD FINDINGS Intracranial internal carotid arteries: Normal. Anterior cerebral arteries: Normal. Middle cerebral arteries: Normal. Posterior communicating arteries: Present on the right. Posterior cerebral arteries: Normal. Basilar artery: Normal. Vertebral arteries: Codominant. Normal. Superior cerebellar arteries: Normal. Anterior inferior cerebellar arteries: Not visualized, which is not uncommon. Posterior inferior cerebellar arteries: Normal. IMPRESSION: 1. Normal MRI of the brain for age. 2. Normal MRA of the circle of Willis and intracranial arteries. Electronically Signed   By: Ulyses Jarred M.D.   On: 09/12/2016 02:12   Ct Head Code Stroke Wo Contrast  Addendum Date: 09/11/2016   ADDENDUM REPORT: 09/11/2016 20:18 ADDENDUM: These results were called by telephone at the time of interpretation on 09/11/2016 at 8:18 pm to Dr. Cheral Marker, who verbally acknowledged these results. Electronically Signed   By: Kristine Garbe M.D.   On: 09/11/2016 20:18   Result Date: 09/11/2016 CLINICAL DATA:  Code stroke. Sudden onset headache and right eye blurred vision. EXAM: CT HEAD WITHOUT CONTRAST TECHNIQUE: Contiguous axial images were obtained from the base of the skull through the vertex without intravenous contrast. COMPARISON:  11/19/2015 CT head. FINDINGS: Brain: No evidence of acute infarction, hemorrhage, hydrocephalus, extra-axial collection or mass lesion/mass effect. Vascular: No hyperdense vessel or unexpected calcification. Skull: Normal. Negative for fracture or focal lesion. Sinuses/Orbits: Mild mucosal thickening of ethmoid air cells. Otherwise the visualized paranasal sinuses and mastoid air cells are normally aerated. Orbits are unremarkable. Other: None. ASPECTS [facility] Stroke Program Early CT Score) - Ganglionic level infarction (caudate, lentiform nuclei, internal capsule, insula, M1-M3 cortex): 7 -  Supraganglionic infarction (M4-M6 cortex): 3 Total score (0-10 with 10 being normal): 10 IMPRESSION: 1. No acute intracranial abnormality identified. If symptoms persist or if clinically indicated MRI is more sensitive for acute stroke. 2. ASPECTS is 10 Electronically Signed: By: Kristine Garbe M.D. On: 09/11/2016 20:11     EKG: Independently reviewed.  Sinus rhythm, QTC 454, no ischemic change  Assessment/Plan Principal Problem:   Stroke-like symptom Active Problems:   Bipolar disorder (H[facility])   Alcoholism ([facility])   Major depressive disorder, recurrent severe without psychotic features ([place])   Right facial numbness   Seizure ([facility])   Hypothyroidism   Stroke-like symptoms   Hypotension   Stroke-like symptom: Etiology is not clear. CT head negative. No signs  of infection. neurology, Dr. Cheral Marker was consulted-->most likely etiology is complicated migraine per Dr. Cheral Marker, he is recommended MRI/MRA, If positive, obtain carotid ultrasound and TTE.  - will place on telemetry bed for observation - Highly appreciated Dr. Yvetta Coder consultation and follow-up recommendations. - ASA 81 mg po qd - will treat HA with Tylenol 1 g, Benadryl 25 mg and Reglan 10 mg now - PT consult, OT consult - Bedside swallowing screen was ordered, will get speech consult in AM - check A1c, FLP and UDS  Alcoholism: Patient denies drinking alcohol. Patient is anxious on admission -CIWA  Bipolar disorder and depression: -Continue Adderall, Lurasidone (latuda)  Hypothyroidism: Last TSH was on 0.836 ounces/1/16 -Continue home Synthroid  Seizure: -Seizure precaution -Continue home Lamictal -When necessary Ativan for seizure -hold tramadol  DDD: -continue home Tylenol, oxycodone  Addendum:   Hypotension: Patient's blood pressure was 108/55 on admission.  Bp dropped to 79/49, and then increased to 82/52 after giving 1.5 L of NS bolus. Etiology is not clear. Patient states that her blood pressure was  normally running low, with systolic blood pressure 85 to 95 mmHg at home. Pt is asymptomatic. She does not have chest pain, shortness breath, lightheadedness. No signs of infection. No leukocytosis or fever.  No symptoms of UTI. She had mild intermittent cough recently, but no cough currently. Her mental status is normal, oriented 3. She is mildly dehydrated, which may have contributed t o hypotension. -will given another 1L of NS bolus (add up to total of 2.5 L)  -continue NS at 125 cc/h -check lactic acid level -check UA to r/o UTI   DVT ppx: SQ Lovenox Code Status: Full code Family Communication: None at bed side.  Disposition Plan:  Anticipate discharge back to previous home environment Consults called: Neurology Dr. Cheral Marker Admission status: Obs / tele    Date of Service 09/12/2016    Ivor Costa [facility] Pager [phone]  If 7PM-7AM, please contact night-coverage [email or web address] Password [email or web address] 09/12/2016, 4:54 AM

## 2016-09-11 NOTE — Consult Note (Signed)
NEURO HOSPITALIST CONSULT NOTE   Requestig physician: Dr. Reather Converse  Reason for Consult: Headache with right sided paresthesias and difficulty with speech  History obtained from:  Patient     HPI:                                                                                                                                          [name] is an 50 y.o. female who presented from home via EMS for c/c right sided headache, blurred vision and slurred speech of sudden onset. LKN 1730. Her PMHx listed in EPIC includes anxiety, bipolar disorder, depression and seizures. She states that her symptoms have significantly improved at time of CT scanning, with residual right face and arm paresthesias and mild right sided sensory loss. Headache has decreased to 5/10, is frontal, nonthrobbing and worsened by light. She states she has a prior history of migraine with aura, although this is not listed in EPIC. She was unsure at time of onset if her symptoms were due to an anxiety attack or a stroke.    Past Medical History:  Diagnosis Date  . Allergy   . Anxiety   . Bipolar disorder ([place])   . DDD (degenerative disc disease)   . Depression   . HSV infection   . Hx: UTI (urinary tract infection)   . Pertussis   . Seizures ([place])    last seizure 12/2014   . Thyroid disease     Past Surgical History:  Procedure Laterality Date  . CERVICAL SPINE SURGERY     x 3 2001,2005,2007 c5-6  . CESAREAN SECTION  2003  . TONSILLECTOMY    . TUBAL LIGATION    . WISDOM TOOTH EXTRACTION      Family History  Problem Relation Age of Onset  . Diabetes Mellitus II Neg Hx   . CAD Neg Hx   . Colon cancer Neg Hx   . Colon polyps Neg Hx   . Heart disease Neg Hx   . Rectal cancer Neg Hx   . Stomach cancer Neg Hx     Social History:  reports that she has never smoked. She has never used smokeless tobacco. She reports that she does not drink alcohol or use drugs.  Allergies  Allergen  Reactions  . Naproxen Anaphylaxis    MEDICATIONS:  No outpatient prescriptions have been marked as taking for the 09/11/16 encounter [facility] Encounter).    ROS:                                                                                                                                       History obtained from patient. No chest pain, abdominal pain, fever or limb pain. Positive for RUE paresthesia and headache.   Blood pressure 108/55, pulse 93, temperature 98.5 F (36.9 C), temperature source Oral, resp. rate 15, height 5\' 1"  (1.549 m), weight 54.4 kg (120 lb), last menstrual period 08/19/2016, SpO2 100 %.   General Examination:                                                                                                      HEENT-  Normocephalic/atraumatic. No nuchal rigidity.  Lungs- Respirations unlabored. No gross wheezes.  Extremities- Warm and well-perfused  Neurological Examination Mental Status: Alert, oriented. Mild tangentiality. Anxious affect. Able to answer all questions and follow all commands. Speech fluent with intact naming.   Cranial Nerves: II: Visual fields intact to confrontation, pupils equal, round and reactive to light  III,IV, VI: ptosis not present, EOMI without nystagmus V,VII: smile symmetric. Decreased FT right side of face VIII: hearing intact to conversation IX,X: uvula rises symmetrically XI: bilateral shoulder shrug normal XII: midline tongue extension Motor:  Right : Upper extremity   5/5    Left:     Upper extremity   5/5  Lower extremity   5/5     Lower extremity   5/5 Normal tone throughout; no atrophy noted Sensory: Temperature sensation decreased RUE and RLE, normal on left. FT intact x 4. No extinction.   Deep Tendon Reflexes: 3+ and symmetric throughout Plantars: Right: downgoing   Left: downgoing Cerebellar: No  ataxia with FNF  Gait: Deferred  Lab Results: Basic Metabolic Panel:  Recent Labs Lab 09/11/16 1953 09/11/16 2000  NA 138 138  K 3.9 3.9  CL 102 99*  CO2 30  --   GLUCOSE 94 88  BUN 13 14  CREATININE 0.89 0.90  CALCIUM 9.4  --     Liver Function Tests:  Recent Labs Lab 09/11/16 1953  AST 24  ALT 15  ALKPHOS 50  BILITOT 0.4  PROT 6.2*  ALBUMIN 4.1   No results for input(s): LIPASE, AMYLASE in the last 168 hours. No results for input(s): AMMONIA in the last 168 hours.  CBC:  Recent Labs Lab  09/11/16 1953 09/11/16 2000  WBC 8.2  --   NEUTROABS 5.0  --   HGB 12.3 12.9  HCT 36.4 38.0  MCV 94.3  --   PLT 185  --     Cardiac Enzymes: No results for input(s): CKTOTAL, CKMB, CKMBINDEX, TROPONINI in the last 168 hours.  Lipid Panel: No results for input(s): CHOL, TRIG, HDL, CHOLHDL, VLDL, LDLCALC in the last 168 hours.  CBG:  Recent Labs Lab 09/11/16 2016  GLUCAP 81    Microbiology: No results found for this or any previous visit.  Coagulation Studies:  Recent Labs  09/11/16 1953  LABPROT 12.0  INR 0.88    Imaging: Ct Head Code Stroke Wo Contrast  Addendum Date: 09/11/2016   ADDENDUM REPORT: 09/11/2016 20:18 ADDENDUM: These results were called by telephone at the time of interpretation on 09/11/2016 at 8:18 pm to Dr. Cheral Marker, who verbally acknowledged these results. Electronically Signed   By: Kristine Garbe M.D.   On: 09/11/2016 20:18   Result Date: 09/11/2016 CLINICAL DATA:  Code stroke. Sudden onset headache and right eye blurred vision. EXAM: CT HEAD WITHOUT CONTRAST TECHNIQUE: Contiguous axial images were obtained from the base of the skull through the vertex without intravenous contrast. COMPARISON:  11/19/2015 CT head. FINDINGS: Brain: No evidence of acute infarction, hemorrhage, hydrocephalus, extra-axial collection or mass lesion/mass effect. Vascular: No hyperdense vessel or unexpected calcification. Skull: Normal. Negative  for fracture or focal lesion. Sinuses/Orbits: Mild mucosal thickening of ethmoid air cells. Otherwise the visualized paranasal sinuses and mastoid air cells are normally aerated. Orbits are unremarkable. Other: None. ASPECTS [facility] Stroke Program Early CT Score) - Ganglionic level infarction (caudate, lentiform nuclei, internal capsule, insula, M1-M3 cortex): 7 - Supraganglionic infarction (M4-M6 cortex): 3 Total score (0-10 with 10 being normal): 10 IMPRESSION: 1. No acute intracranial abnormality identified. If symptoms persist or if clinically indicated MRI is more sensitive for acute stroke. 2. ASPECTS is 10 Electronically Signed: By: Kristine Garbe M.D. On: 09/11/2016 20:11    Assessment: 1. Headache with visual blurring, right face/arm sensory numbness/parethesia and speech difficulty. CT head negative. Symptoms resolved except for 5/5 frontal headache and mild paresthesias to RUE. Most likely etiology is complicated migraine. tPA not indicated. Risks of CTA outweigh potential benefits given NIHSS of 0 and rapidly improving symptoms. 2. History of bipolar disorder, depression and anxiety.  3. History of seizures. Last occurrence was March 2016. Not on an anticonvulsant.   Recommendations: 1. Obtain MRI brain and MRA head. If positive, obtain carotid ultrasound and TTE. 2. ASA 81 mg po qd.  3. Phenergan 25 mg IV x 1.  4. Outpatient Neurology follow up for headache management.   Electronically signed: Dr. Kerney Elbe 09/11/2016, 8:47 PM

## 2016-09-11 NOTE — ED Notes (Signed)
Patient transported to X-ray 

## 2016-09-11 NOTE — ED Notes (Signed)
Pt a&ox 4; ambulatory; Pt states HA all day but at 7/10 on assessment,meds given; pt passed swallow screen; pt states she is hard of hearing but appear to hear RN fine; Pt has hx of migraines; Pt denies numbness on assessment; code stroke cancelled prior to this RN receiving pt. MRI has called and will get patient upstairs; Pt to get xray and then transported to [facility]

## 2016-09-11 NOTE — ED Notes (Signed)
ED Provider at bedside. 

## 2016-09-11 NOTE — ED Triage Notes (Signed)
Pt brought to ED by [facility] from home for c/o  Right side HA, blurred vision and slurred speech sudden unset, last seen well by pt was at 1730. VS for EMS BP 120/70, HR-90, R-12, CBG 97.

## 2016-09-12 ENCOUNTER — Observation Stay (HOSPITAL_COMMUNITY): Payer: Medicare HMO

## 2016-09-12 DIAGNOSIS — I959 Hypotension, unspecified: Secondary | ICD-10-CM | POA: Diagnosis not present

## 2016-09-12 DIAGNOSIS — R2990 Unspecified symptoms and signs involving the nervous system: Secondary | ICD-10-CM | POA: Diagnosis not present

## 2016-09-12 DIAGNOSIS — R200 Anesthesia of skin: Secondary | ICD-10-CM | POA: Diagnosis not present

## 2016-09-12 DIAGNOSIS — R4781 Slurred speech: Secondary | ICD-10-CM | POA: Diagnosis not present

## 2016-09-12 LAB — RAPID URINE DRUG SCREEN, HOSP PERFORMED
Amphetamines: POSITIVE — AB
BARBITURATES: NOT DETECTED
Benzodiazepines: NOT DETECTED
Cocaine: NOT DETECTED
OPIATES: POSITIVE — AB
TETRAHYDROCANNABINOL: NOT DETECTED

## 2016-09-12 LAB — LIPID PANEL
Cholesterol: 120 mg/dL (ref 0–200)
HDL: 55 mg/dL (ref >40)
LDL CALC: 52 mg/dL (ref 0–99)
TRIGLYCERIDES: 65 mg/dL (ref <150)
Total CHOL/HDL Ratio: 2.2 RATIO
VLDL: 13 mg/dL (ref 0–40)

## 2016-09-12 LAB — URINALYSIS, ROUTINE W REFLEX MICROSCOPIC
BILIRUBIN URINE: NEGATIVE
Glucose, UA: NEGATIVE mg/dL
Hgb urine dipstick: NEGATIVE
KETONES UR: NEGATIVE mg/dL
LEUKOCYTES UA: NEGATIVE
NITRITE: NEGATIVE
PH: 6.5 (ref 5.0–8.0)
PROTEIN: NEGATIVE mg/dL
Specific Gravity, Urine: 1.01 (ref 1.005–1.030)

## 2016-09-12 LAB — GLUCOSE, CAPILLARY
GLUCOSE-CAPILLARY: 85 mg/dL (ref 65–99)
Glucose-Capillary: 78 mg/dL (ref 65–99)
Glucose-Capillary: 109 mg/dL — ABNORMAL HIGH (ref 65–99)

## 2016-09-12 LAB — LACTIC ACID, PLASMA: Lactic Acid, Venous: 0.6 mmol/L (ref 0.5–1.9)

## 2016-09-12 MED ORDER — SODIUM CHLORIDE 0.9 % IV BOLUS (SEPSIS)
1000.0000 mL | Freq: Once | INTRAVENOUS | Status: AC
  Administered 2016-09-12: 1000 mL via INTRAVENOUS

## 2016-09-12 MED ORDER — LEVOTHYROXINE SODIUM 25 MCG PO TABS
25.0000 ug | ORAL_TABLET | Freq: Every day | ORAL | Status: DC
  Administered 2016-09-13: 25 ug via ORAL
  Filled 2016-09-12: qty 1

## 2016-09-12 MED ORDER — SODIUM CHLORIDE 0.9 % IV BOLUS (SEPSIS)
1500.0000 mL | Freq: Once | INTRAVENOUS | Status: AC
  Administered 2016-09-12: 1500 mL via INTRAVENOUS

## 2016-09-12 NOTE — Evaluation (Signed)
Physical Therapy Evaluation Patient Details Name: AASIYAH SCHWITZER MRN: [id number] DOB: 1966/08/03 Today's Date: 09/12/2016   History of Present Illness  Patient is a 50 yo female admitted 09/11/16 with decreased sensation Rt and decreased speech, to r/o CVA.    PMH:  syncope, depression, anxiety, DDD, C-spine surgery x3, ETOH abuse, HOH, seizures  Clinical Impression  Patient is functioning at independent to Mod I level with all mobility and gait.  Balance good during gait.  No acute PT needs identified - PT will sign off.    Follow Up Recommendations No PT follow up;Supervision - Intermittent    Equipment Recommendations  None recommended by PT    Recommendations for Other Services       Precautions / Restrictions Precautions Precautions: None Restrictions Weight Bearing Restrictions: No      Mobility  Bed Mobility Overal bed mobility: Independent                Transfers Overall transfer level: Modified independent Equipment used: None             General transfer comment: Increased time  Ambulation/Gait Ambulation/Gait assistance: Modified independent (Device/Increase time) Ambulation Distance (Feet): 160 Feet Assistive device: None Gait Pattern/deviations: Step-through pattern;Decreased stride length Gait velocity: decreased Gait velocity interpretation: Below normal speed for age/gender General Gait Details: Increased time needed - slow gait speed.  No loss of balance with no assistive device.  Stairs            Wheelchair Mobility    Modified Rankin (Stroke Patients Only) Modified Rankin (Stroke Patients Only) Pre-Morbid Rankin Score: No significant disability Modified Rankin: No significant disability     Balance Overall balance assessment: Modified Independent                                           Pertinent Vitals/Pain Pain Assessment: 0-10 Pain Score: 8  Pain Location: Neck Pain Descriptors /  Indicators: Aching;Guarding;Sharp;Sore Pain Intervention(s): Monitored during session;Repositioned;Patient requesting pain meds-RN notified    Home Living Family/patient expects to be discharged to:: Private residence Living Arrangements: Parent;Children Available Help at Discharge: Family;Available PRN/intermittently Type of Home: House Home Access: Stairs to enter Entrance Stairs-Rails: Psychiatric nurse of Steps: 5 Home Layout: One level Home Equipment: None      Prior Function Level of Independence: Independent         Comments: Drives, cooks. cleans.     Hand Dominance        Extremity/Trunk Assessment   Upper Extremity Assessment: Overall WFL for tasks assessed (Symmetrical)           Lower Extremity Assessment: Overall WFL for tasks assessed (Symmetrical)      Cervical / Trunk Assessment: Other exceptions  Communication   Communication: No difficulties  Cognition Arousal/Alertness: Awake/alert Behavior During Therapy: WFL for tasks assessed/performed;Flat affect Overall Cognitive Status: Within Functional Limits for tasks assessed                      General Comments      Exercises     Assessment/Plan    PT Assessment Patent does not need any further PT services  PT Problem List            PT Treatment Interventions      PT Goals (Current goals can be found in the Care Plan section)  Acute Rehab PT  Goals PT Goal Formulation: All assessment and education complete, DC therapy    Frequency     Barriers to discharge        Co-evaluation               End of Session   Activity Tolerance: Patient tolerated treatment well;Patient limited by pain Patient left: in bed;with call bell/phone within reach;with bed alarm set Nurse Communication: Mobility status;Patient requests pain meds    Functional Assessment Tool Used: Clinical judgement Functional Limitation: Mobility: Walking and moving  around Mobility: Walking and Moving Around Current Status [phone]): 0 percent impaired, limited or restricted Mobility: Walking and Moving Around Goal Status [phone]): 0 percent impaired, limited or restricted Mobility: Walking and Moving Around Discharge Status [phone] 0 percent impaired, limited or restricted    Time: 1401-1418 PT Time Calculation (min) (ACUTE ONLY): 17 min   Charges:   PT Evaluation $PT Eval Low Complexity: 1 Procedure     PT G Codes:   PT G-Codes **NOT FOR INPATIENT CLASS** Functional Assessment Tool Used: Clinical judgement Functional Limitation: Mobility: Walking and moving around Mobility: Walking and Moving Around Current Status [id number]): 0 percent impaired, limited or restricted Mobility: Walking and Moving Around Goal Status [phone]): 0 percent impaired, limited or restricted Mobility: Walking and Moving Around Discharge Status [id number] 0 percent impaired, limited or restricted    Despina Pole 09/12/2016, 2:30 PM Carita Pian. Sanjuana Kava, [place] Pager [phone]

## 2016-09-12 NOTE — Progress Notes (Signed)
PROGRESS NOTE    [name]  [id number] DOB: 05-29-1966 DOA: 09/11/2016 PCP: Sandi Mariscal, MD   Brief Narrative: 50 y.o. female with medical history significant of seizure, bipolar disorder, depression, anxiety, alcoholism, who presents with headache, blurry vision, slurred speech, right facial numbness.      Assessment & Plan:   # Stroke-like symptom (headache, blurry vision, slurred speech, right facial numbness.): CT scan of head, MRI MRA unremarkable. Symptoms resolved except some residual headache. Most likely patient has complicated migraine. Already evaluated by neurologist. Recommended outpatient follow-up with neurology. Continue supportive care. Patient has no focal neurological deficit.  #  Bipolar disorder [facility]): Continue home medication. Mood is stable today.   # h/o Seizure ([place]); continue Lamictal. Seizure precaution.   # Hypothyroidism; resume home dose of Synthroid. Outpatient follow-up  # asymptomatic hypotension: Patient is a symptomatically. She reported that her baseline blood pressure is low at home. Probably she is currently at her baseline blood pressure. No sign of infection. We will continue IV fluid and monitor blood pressure. Continue current medical and supportive care. DVT prophylaxis: Lovenox Code Status: Full code Family Communication: No family present Disposition Plan: Likely discharge home tomorrow. Consultants:   Neurology  Procedures: MRI/MRA Antimicrobials: None  Subjective: Patient was seen and examined at bedside. Patient reported feeling much better. She denied fever, chills, headache, dizziness, lightheadedness, slurred speech, numbness, tingling sensation, chest pain or shortness of breath. No nausea vomiting. No abdominal pain.   Objective: Vitals:   09/12/16 0800 09/12/16 0900 09/12/16 1000 09/12/16 1100  BP: (!) 87/51 (!) 90/57 (!) 91/58 (!) 94/51  Pulse: 80 78 92 86  Resp: 14 12  14   Temp: 98.1 F (36.7 C)       TempSrc: Oral     SpO2: 97% 96%    Weight:      Height:        Intake/Output Summary (Last 24 hours) at 09/12/16 1345 Last data filed at 09/12/16 1000  Gross per 24 hour  Intake          1304.58 ml  Output                0 ml  Net          1304.58 ml   Filed Weights   09/11/16 1900 09/11/16 2020 09/11/16 2300  Weight: 54.4 kg (120 lb) 54.4 kg (120 lb) 55.1 kg (121 lb 7.6 oz)    Examination:  General exam: Appears calm and comfortable  Respiratory system: Clear to auscultation. Respiratory effort normal. No wheezing or crackle Cardiovascular system: S1 & S2 heard, RRR.  No pedal edema. Gastrointestinal system: Abdomen is nondistended, soft and nontender. Normal bowel sounds heard. Central nervous system: Alert and oriented. No focal neurological deficits. Extremities: Symmetric 5 x 5 power. Skin: No rashes, lesions or ulcers Psychiatry: Judgement and insight appear normal. Mood & affect appropriate.     Data Reviewed: I have personally reviewed following labs and imaging studies  CBC:  Recent Labs Lab 09/11/16 1953 09/11/16 2000  WBC 8.2  --   NEUTROABS 5.0  --   HGB 12.3 12.9  HCT 36.4 38.0  MCV 94.3  --   PLT 185  --    Basic Metabolic Panel:  Recent Labs Lab 09/11/16 1953 09/11/16 2000  NA 138 138  K 3.9 3.9  CL 102 99*  CO2 30  --   GLUCOSE 94 88  BUN 13 14  CREATININE 0.89 0.90  CALCIUM 9.4  --  GFR: Estimated Creatinine Clearance: 55.8 mL/min (by C-G formula based on SCr of 0.9 mg/dL). Liver Function Tests:  Recent Labs Lab 09/11/16 1953  AST 24  ALT 15  ALKPHOS 50  BILITOT 0.4  PROT 6.2*  ALBUMIN 4.1   No results for input(s): LIPASE, AMYLASE in the last 168 hours. No results for input(s): AMMONIA in the last 168 hours. Coagulation Profile:  Recent Labs Lab 09/11/16 1953 09/11/16 2257  INR 0.88 0.94   Cardiac Enzymes: No results for input(s): CKTOTAL, CKMB, CKMBINDEX, TROPONINI in the last 168 hours. BNP (last 3  results) No results for input(s): PROBNP in the last 8760 hours. HbA1C: No results for input(s): HGBA1C in the last 72 hours. CBG:  Recent Labs Lab 09/11/16 2016 09/11/16 2314 09/12/16 0636 09/12/16 1121  GLUCAP 81 96 78 109*   Lipid Profile:  Recent Labs  09/12/16 0448  CHOL 120  HDL 55  LDLCALC 52  TRIG 65  CHOLHDL 2.2   Thyroid Function Tests: No results for input(s): TSH, T4TOTAL, FREET4, T3FREE, THYROIDAB in the last 72 hours. Anemia Panel: No results for input(s): VITAMINB12, FOLATE, FERRITIN, TIBC, IRON, RETICCTPCT in the last 72 hours. Sepsis Labs:  Recent Labs Lab 09/12/16 0448  LATICACIDVEN 0.6    No results found for this or any previous visit (from the past 240 hour(s)).       Radiology Studies: Dg Chest 2 View  Result Date: 09/11/2016 CLINICAL DATA:  Acute onset of stroke-like symptoms. Initial encounter. EXAM: CHEST  2 VIEW COMPARISON:  Chest radiograph performed 11/19/2015 FINDINGS: The lungs are well-aerated and clear. There is no evidence of focal opacification, pleural effusion or pneumothorax. The heart is normal in size; the mediastinal contour is within normal limits. No acute osseous abnormalities are seen. Cervical spinal fusion hardware is partially imaged. IMPRESSION: No acute cardiopulmonary process seen. Electronically Signed   By: Garald Balding M.D.   On: 09/11/2016 22:36   Mr Brain Wo Contrast  Result Date: 09/12/2016 CLINICAL DATA:  Headache, blurry vision and right facial numbness. EXAM: MRI HEAD WITHOUT CONTRAST MRA HEAD WITHOUT CONTRAST TECHNIQUE: Multiplanar, multiecho pulse sequences of the brain and surrounding structures were obtained without intravenous contrast. Angiographic images of the head were obtained using MRA technique without contrast. COMPARISON:  Head CT 09/11/2016 FINDINGS: MRI HEAD FINDINGS Brain: No acute infarct or intraparenchymal hemorrhage. The midline structures are normal. No focal parenchymal signal  abnormality. No mass lesion or midline shift. No hydrocephalus or extra-axial fluid collection. No age advanced or lobar predominant atrophy. Vascular: Major intracranial arterial and venous sinus flow voids are preserved. No evidence of chronic microhemorrhage or amyloid angiopathy. Skull and upper cervical spine: The visualized skull base, calvarium, upper cervical spine and extracranial soft tissues are normal. Sinuses/Orbits: No fluid levels or advanced mucosal thickening. No mastoid effusion. Normal orbits. MRA HEAD FINDINGS Intracranial internal carotid arteries: Normal. Anterior cerebral arteries: Normal. Middle cerebral arteries: Normal. Posterior communicating arteries: Present on the right. Posterior cerebral arteries: Normal. Basilar artery: Normal. Vertebral arteries: Codominant. Normal. Superior cerebellar arteries: Normal. Anterior inferior cerebellar arteries: Not visualized, which is not uncommon. Posterior inferior cerebellar arteries: Normal. IMPRESSION: 1. Normal MRI of the brain for age. 2. Normal MRA of the circle of Willis and intracranial arteries. Electronically Signed   By: Ulyses Jarred M.D.   On: 09/12/2016 02:12   Mr Jodene Nam Head/brain [id number] Cm  Result Date: 09/12/2016 CLINICAL DATA:  Headache, blurry vision and right facial numbness. EXAM: MRI HEAD WITHOUT CONTRAST MRA HEAD  WITHOUT CONTRAST TECHNIQUE: Multiplanar, multiecho pulse sequences of the brain and surrounding structures were obtained without intravenous contrast. Angiographic images of the head were obtained using MRA technique without contrast. COMPARISON:  Head CT 09/11/2016 FINDINGS: MRI HEAD FINDINGS Brain: No acute infarct or intraparenchymal hemorrhage. The midline structures are normal. No focal parenchymal signal abnormality. No mass lesion or midline shift. No hydrocephalus or extra-axial fluid collection. No age advanced or lobar predominant atrophy. Vascular: Major intracranial arterial and venous sinus flow voids are  preserved. No evidence of chronic microhemorrhage or amyloid angiopathy. Skull and upper cervical spine: The visualized skull base, calvarium, upper cervical spine and extracranial soft tissues are normal. Sinuses/Orbits: No fluid levels or advanced mucosal thickening. No mastoid effusion. Normal orbits. MRA HEAD FINDINGS Intracranial internal carotid arteries: Normal. Anterior cerebral arteries: Normal. Middle cerebral arteries: Normal. Posterior communicating arteries: Present on the right. Posterior cerebral arteries: Normal. Basilar artery: Normal. Vertebral arteries: Codominant. Normal. Superior cerebellar arteries: Normal. Anterior inferior cerebellar arteries: Not visualized, which is not uncommon. Posterior inferior cerebellar arteries: Normal. IMPRESSION: 1. Normal MRI of the brain for age. 2. Normal MRA of the circle of Willis and intracranial arteries. Electronically Signed   By: Ulyses Jarred M.D.   On: 09/12/2016 02:12   Ct Head Code Stroke Wo Contrast  Addendum Date: 09/11/2016   ADDENDUM REPORT: 09/11/2016 20:18 ADDENDUM: These results were called by telephone at the time of interpretation on 09/11/2016 at 8:18 pm to Dr. Cheral Marker, who verbally acknowledged these results. Electronically Signed   By: Kristine Garbe M.D.   On: 09/11/2016 20:18   Result Date: 09/11/2016 CLINICAL DATA:  Code stroke. Sudden onset headache and right eye blurred vision. EXAM: CT HEAD WITHOUT CONTRAST TECHNIQUE: Contiguous axial images were obtained from the base of the skull through the vertex without intravenous contrast. COMPARISON:  11/19/2015 CT head. FINDINGS: Brain: No evidence of acute infarction, hemorrhage, hydrocephalus, extra-axial collection or mass lesion/mass effect. Vascular: No hyperdense vessel or unexpected calcification. Skull: Normal. Negative for fracture or focal lesion. Sinuses/Orbits: Mild mucosal thickening of ethmoid air cells. Otherwise the visualized paranasal sinuses and mastoid  air cells are normally aerated. Orbits are unremarkable. Other: None. ASPECTS [facility] Stroke Program Early CT Score) - Ganglionic level infarction (caudate, lentiform nuclei, internal capsule, insula, M1-M3 cortex): 7 - Supraganglionic infarction (M4-M6 cortex): 3 Total score (0-10 with 10 being normal): 10 IMPRESSION: 1. No acute intracranial abnormality identified. If symptoms persist or if clinically indicated MRI is more sensitive for acute stroke. 2. ASPECTS is 10 Electronically Signed: By: Kristine Garbe M.D. On: 09/11/2016 20:11        Scheduled Meds: . amphetamine-dextroamphetamine  20 mg Oral Daily  . aspirin EC  81 mg Oral Daily  . cholecalciferol  1,000 Units Oral Daily  . enoxaparin (LOVENOX) injection  40 mg Subcutaneous Q24H  . folic acid  1 mg Oral Daily  . lamoTRIgine  100 mg Oral q morning - 10a  . LORazepam  0-4 mg Intravenous Q6H   Followed by  . [START ON 09/13/2016] LORazepam  0-4 mg Intravenous Q12H  . lurasidone  40 mg Oral Q breakfast  . multivitamin with minerals  1 tablet Oral Daily  . thiamine  100 mg Oral Daily   Or  . thiamine  100 mg Intravenous Daily  . vitamin B-12  1,000 mcg Oral Daily   Continuous Infusions: . sodium chloride 125 mL/hr at 09/12/16 0800     LOS: 0 days  Diamond Jentz Tanna Furry, MD [facility] Pager [phone]  If 7PM-7AM, please contact night-coverage [email or web address] Password [email or web address] 09/12/2016, 1:45 PM

## 2016-09-12 NOTE — Progress Notes (Signed)
BP has been running in the [id number] systolic and 123456 diastolic. Pt states sometimes her BP runs low in that range. Pt alert and oriented x4. No other symptoms noted. MD aware and order boluses and lab work. See flowsheet for VS.

## 2016-09-12 NOTE — Evaluation (Signed)
Speech Language Pathology Evaluation Patient Details Name: [name] MRN: [id number] DOB: May 02, 1966 Today's Date: 09/12/2016 Time: 1015-1030 SLP Time Calculation (min) (ACUTE ONLY): 15 min  Problem List:  Patient Active Problem List   Diagnosis Date Noted  . Hypotension 09/12/2016  . Right facial numbness 09/11/2016  . Stroke-like symptom 09/11/2016  . Seizure ([place]) 09/11/2016  . Hypothyroidism 09/11/2016  . Stroke-like symptoms 09/11/2016  . Fall   . Syncope 11/20/2015  . Scalp laceration 11/20/2015  . Major depressive disorder, recurrent severe without psychotic features ([place]) 03/23/2015  . DDD (degenerative disc disease)   . ACUTE BRONCHITIS 10/22/2009  . GERD 08/02/2009  . DEGENERATIVE DISC DISEASE 08/02/2009  . COUGH 08/02/2009  . Bipolar disorder ([place]) 08/01/2009  . Alcoholism ([place]) 08/01/2009  . BULIMIA 08/01/2009  . HEARING LOSS, BILATERAL 08/01/2009  . OSTEOPENIA 08/01/2009  . SEIZURE DISORDER 08/01/2009   Past Medical History:  Past Medical History:  Diagnosis Date  . Allergy   . Anxiety   . Bipolar disorder ([place])   . DDD (degenerative disc disease)   . Depression   . HSV infection   . Hx: UTI (urinary tract infection)   . Pertussis   . Seizures ([place])    last seizure 12/2014   . Thyroid disease    Past Surgical History:  Past Surgical History:  Procedure Laterality Date  . CERVICAL SPINE SURGERY     x 3 2001,2005,2007 c5-6  . CESAREAN SECTION  2003  . TONSILLECTOMY    . TUBAL LIGATION    . WISDOM TOOTH EXTRACTION     HPI:  Patient is a 50 y.o. female with PMH:  seizure, bipolar disorder, depression, anxiety, alcoholism, who who presented to ED with headache, blurry vision, slurred speech, right facial numbness. MRI did not reveal any acute infarct or hemorrhage   Assessment / Plan / Recommendation Clinical Impression  Patient's cognitive-linguistic functioning was within normal limits and although she did exhibit a mild articulation  impairment, patient stated that this was normal for her, as she has bilateral hearing loss which affects her speech intelligibility. Speech quality was decreased, but SLP judged her conversational-level speech intelligibility to be 90%.    SLP Assessment  Patient does not need any further Speech Lanaguage Pathology Services    Follow Up Recommendations  None    Frequency and Duration   N/A        SLP Evaluation Cognition  Overall Cognitive Status: Within Functional Limits for tasks assessed Orientation Level: Oriented X4 Memory: Appears intact Awareness: Appears intact Problem Solving: Appears intact Executive Function: Reasoning Reasoning: Appears intact Safety/Judgment: Appears intact       Comprehension  Auditory Comprehension Overall Auditory Comprehension: Appears within functional limits for tasks assessed    Expression Expression Primary Mode of Expression: Verbal Verbal Expression Overall Verbal Expression: Appears within functional limits for tasks assessed   Oral / Motor  Oral Motor/Sensory Function Overall Oral Motor/Sensory Function: Within functional limits Motor Speech Overall Motor Speech: Appears within functional limits for tasks assessed Articulation: Impaired (patient reports baseline "lisp" caused by hearing loss) Level of Impairment: Phrase Intelligibility: Intelligibility reduced Word: 75-100% accurate Phrase: 75-100% accurate Sentence: 75-100% accurate Conversation: 75-100% accurate Motor Planning: Witnin functional limits   GO          Functional Assessment Tool Used: clinical judgement Functional Limitations: Spoken language comprehension Motor Speech Current Status [phone]): At least 20 percent but less than 40 percent impaired, limited or restricted Motor Speech Goal Status [phone]): At least  1 percent but less than 20 percent impaired, limited or restricted Motor Speech Goal Status [phone]): At least 1 percent but less than 20 percent  impaired, limited or restricted Spoken Language Comprehension Current Status [phone]): 0 percent impaired, limited or restricted Spoken Language Comprehension Goal Status [phone]): 0 percent impaired, limited or restricted Spoken Language Comprehension Discharge Status [phone]): 0 percent impaired, limited or restricted Spoken Language Expression Current Status [phone]): 0 percent impaired, limited or restricted Spoken Language Expression Goal Status [id number]): 0 percent impaired, limited or restricted Spoken Language Expression Discharge Status [phone]): 0 percent impaired, limited or restricted         Dannial Monarch 09/12/2016, 1:49 PM   Sonia Baller, MA, CCC-SLP 09/12/16 1:50 PM

## 2016-09-12 NOTE — Progress Notes (Signed)
OT Cancellation Note  Patient Details Name: EMYLIE POPPERT MRN: [id number] DOB: October 27, 1965   Cancelled Treatment:    Reason Eval/Treat Not Completed: Patient not medically ready (active bedrest orders). Will follow up for OT eval with updated activity orders.  Binnie Kand M.S., OTR/L Pager: [phone]  09/12/2016, 10:25 AM

## 2016-09-12 NOTE — Progress Notes (Signed)
STROKE TEAM PROGRESS NOTE   HISTORY OF PRESENT ILLNESS (per record) [name] is an 50 y.o. female who presented from home via EMS for c/c right sided headache, blurred vision and slurred speech of sudden onset. LKN 1730. Her PMHx listed in EPIC includes anxiety, bipolar disorder, depression and seizures. She states that her symptoms have significantly improved at time of CT scanning, with residual right face and arm paresthesias and mild right sided sensory loss. Headache has decreased to 5/10, is frontal, nonthrobbing and worsened by light. She states she has a prior history of migraine with aura, although this is not listed in EPIC. She was unsure at time of onset if her symptoms were due to an anxiety attack or a stroke.    SUBJECTIVE (INTERVAL HISTORY) No family members present. The patient reports that the right side of her face is still numb. She has a history of migraine headaches and this episode was associated with a headache which she rated as an 8 on a 1-10 scale. She began to feel better last night. She reports her blood pressure tends to run low.   OBJECTIVE Temp:  [97.7 F (36.5 C)-98.5 F (36.9 C)] 98.1 F (36.7 C) (11/18 0709) Pulse Rate:  [73-93] 79 (11/18 0709) Cardiac Rhythm: Normal sinus rhythm (11/17 2304) Resp:  [12-21] 12 (11/18 0709) BP: (79-108)/(41-78) 91/54 (11/18 0709) SpO2:  [97 %-100 %] 99 % (11/18 0709) Weight:  [54.4 kg (120 lb)-55.1 kg (121 lb 7.6 oz)] 55.1 kg (121 lb 7.6 oz) (11/17 2300)  CBC:  Recent Labs Lab 09/11/16 1953 09/11/16 2000  WBC 8.2  --   NEUTROABS 5.0  --   HGB 12.3 12.9  HCT 36.4 38.0  MCV 94.3  --   PLT 185  --     Basic Metabolic Panel:  Recent Labs Lab 09/11/16 1953 09/11/16 2000  NA 138 138  K 3.9 3.9  CL 102 99*  CO2 30  --   GLUCOSE 94 88  BUN 13 14  CREATININE 0.89 0.90  CALCIUM 9.4  --     Lipid Panel:    Component Value Date/Time   CHOL 120 09/12/2016 0448   TRIG 65 09/12/2016 0448   HDL 55  09/12/2016 0448   CHOLHDL 2.2 09/12/2016 0448   VLDL 13 09/12/2016 0448   LDLCALC 52 09/12/2016 0448   HgbA1c: No results found for: HGBA1C Urine Drug Screen:    Component Value Date/Time   LABOPIA POSITIVE (A) 09/12/2016 0447   COCAINSCRNUR NONE DETECTED 09/12/2016 0447   COCAINSCRNUR NEGATIVE 02/14/2008 2140   LABBENZ NONE DETECTED 09/12/2016 0447   LABBENZ (A) 02/14/2008 2140    POSITIVE (NOTE) Result repeated and verified. Sent for confirmatory testing   AMPHETMU POSITIVE (A) 09/12/2016 0447   THCU NONE DETECTED 09/12/2016 0447   LABBARB NONE DETECTED 09/12/2016 0447      IMAGING  Dg Chest 2 View 09/11/2016 No acute cardiopulmonary process seen.     Mr Jodene Nam Head/brain Wo Cm 09/12/2016 1. Normal MRI of the brain for age.  2. Normal MRA of the circle of Willis and intracranial arteries.     Ct Head Code Stroke Wo Contrast 09/11/2016 1. No acute intracranial abnormality identified. If symptoms persist or if clinically indicated MRI is more sensitive for acute stroke.  2. ASPECTS is 10     PHYSICAL EXAM HEENT-  Normocephalic/atraumatic. No nuchal rigidity.  Lungs- Respirations unlabored. No gross wheezes.  Extremities- Warm and well-perfused  Neurological Examination Mental Status: Alert,  oriented. Mild tangentiality. Anxious affect. Able to answer all questions and follow all commands. Speech fluent with intact naming.   Cranial Nerves: II: Visual fields intact to confrontation, pupils equal, round and reactive to light  III,IV, VI: ptosis not present, EOMI without nystagmus V,VII: smile symmetric. Decreased FT right side of face VIII: hearing intact to conversation IX,X: uvula rises symmetrically XI: bilateral shoulder shrug normal XII: midline tongue extension Motor:  Right :  Upper extremity   5/5                                      Left:     Upper extremity   5/5             Lower extremity   5/5                                                  Lower  extremity   5/5 Normal tone throughout; no atrophy noted Sensory: Temperature sensation decreased RUE and RLE, normal on left. FT intact x 4. No extinction.   Deep Tendon Reflexes: 3+ and symmetric throughout Plantars: Right: downgoing                           Left: downgoing Cerebellar: No ataxia with FNF  Gait: Deferred    ASSESSMENT/PLAN [name] is a 50 y.o. female with history of anxiety, migraine headaches, bipolar disorder, hypothyroidism, depression and seizures, presenting with a right-sided headache, blurred vision, slurred speech, and right hemisensory deficits. She did not receive IV t-PA due to mild, improving deficits  Possible TIA vs atypical migraine: Dominant   Resultant   No deficits  MRI - normal  MRA - normal  Carotid Doppler - will order  2D Echo - 11/21/2015 - EF 55-60%. No cardiac source of emboli identified.  LDL - 52  HgbA1c pending  VTE prophylaxis - Lovenox  Diet Heart Room service appropriate? Yes; Fluid consistency: Thin  No antithrombotic prior to admission, now on aspirin 81 mg daily  Patient counseled to be compliant with her antithrombotic medications  Ongoing aggressive stroke risk factor management  Therapy recommendations: pending  Disposition: Pending  Hypertension  Blood pressure tends to run low  Permissive hypertension (OK if < 220/120) but gradually normalize in 5-7 days  Long-term BP goal normotensive  Hyperlipidemia  Home meds: No lipid lowering medications prior to admission  LDL 52, goal < 70  Continue statin at discharge   Other Stroke Risk Factors  Migraines   Other Active Problems   UDS - positive for benzodiazepines, opiates, and amphetamines. (Listed as prescription medications for this patient with bipolar disorder except for benzodiazepines - but received Ativan at time of admission)  Mild hypotension  Hospital day # 0  Mikey Bussing PA-C [facility] Pager [phone] 09/12/2016, 2:21 PM I have personally examined this patient, reviewed notes, independently viewed imaging studies, participated in medical decision making and plan of care.ROS completed by me personally and pertinent positives fully documented  I have made any additions or clarifications directly to the above note. Agree with note above. She presented with right sided paresthesias and headache possibly complicated migraine.She needs ongoing stroke evaluation as she  is at risk for strokes. Greater than 50 % time during this 35 minute visit was spent on counselling and coordination of care about stroke and migraine.  Antony Contras, MD Medical Director [facility] Pager: [phone] 09/12/2016 11:27 PM   To contact Stroke Continuity provider, please refer to [email or web address] After hours, contact General Neurology

## 2016-09-13 ENCOUNTER — Observation Stay (HOSPITAL_BASED_OUTPATIENT_CLINIC_OR_DEPARTMENT_OTHER): Payer: Medicare HMO

## 2016-09-13 ENCOUNTER — Encounter (HOSPITAL_COMMUNITY): Payer: Self-pay

## 2016-09-13 DIAGNOSIS — R2990 Unspecified symptoms and signs involving the nervous system: Secondary | ICD-10-CM | POA: Diagnosis not present

## 2016-09-13 DIAGNOSIS — R4781 Slurred speech: Secondary | ICD-10-CM | POA: Diagnosis not present

## 2016-09-13 DIAGNOSIS — R200 Anesthesia of skin: Secondary | ICD-10-CM | POA: Diagnosis not present

## 2016-09-13 DIAGNOSIS — I959 Hypotension, unspecified: Secondary | ICD-10-CM | POA: Diagnosis not present

## 2016-09-13 LAB — GLUCOSE, CAPILLARY: GLUCOSE-CAPILLARY: 91 mg/dL (ref 65–99)

## 2016-09-13 LAB — HEMOGLOBIN A1C
HEMOGLOBIN A1C: 5.4 % (ref 4.8–5.6)
Mean Plasma Glucose: 108 mg/dL

## 2016-09-13 MED ORDER — ASPIRIN 81 MG PO TBEC: 81.0000 mg | DELAYED_RELEASE_TABLET | Freq: Every day | ORAL | 0 refills | Status: DC

## 2016-09-13 NOTE — Discharge Summary (Signed)
Physician Discharge Summary  [name] [id number] DOB: [birth year] DOA: 09/11/2016  PCP: Sandi Mariscal, MD  Admit date: 09/11/2016 Discharge date: 09/13/2016  Admitted From:home Disposition:home  Recommendations for Outpatient Follow-up:  1. Follow up with PCP in 1-2 weeks 2. Please obtain BMP/CBC in one week   Home Health:no Equipment/Devices:no Discharge Condition:stable CODE STATUS:stable Diet recommendation:heart healthy  Brief/Interim Summary:50 y.o.femalewith medical history significant of seizure, bipolar disorder, depression, anxiety, alcoholism, whopresents with headache, blurry vision, slurred speech, right facial numbness.   # Stroke-like symptom (headache, blurry vision, slurred speech, right facial numbness.): CT scan of head, MRI MRA unremarkable. Symptoms resolved in the hospital. Most likely patient has complicated migraine. Evaluated by neurologist. Clinically improved, on ASA. Advised outpatient follow up.  Carotid US unremarkable  #  Bipolar disorder [facility]): Continue home medication. Mood is stable today.   # h/o Seizure ([place]); continue Lamictal. Seizure precaution.   # Hypothyroidism; resume home dose of Synthroid. Outpatient follow-up  # asymptomatic hypotension: Patient is asymptomatic. She reported that her baseline blood pressure is low at home. Probably she is currently at her baseline blood pressure. No sign of infection. Advised to monitor BP at home.  Discharge Diagnoses:  Principal Problem:   Stroke-like symptom Active Problems:   Bipolar disorder (HCC)   Alcoholism (HCC)   Major depressive disorder, recurrent severe without psychotic features ([place])   Right facial numbness   Seizure ([place])   Hypothyroidism   Stroke-like symptoms   Hypotension    Discharge Instructions  Discharge Instructions    Call MD for:  difficulty breathing, headache or visual disturbances    Complete by:  As directed    Call MD for:  extreme fatigue     Complete by:  As directed    Call MD for:  hives    Complete by:  As directed    Call MD for:  persistant dizziness or light-headedness    Complete by:  As directed    Call MD for:  persistant nausea and vomiting    Complete by:  As directed    Call MD for:  severe uncontrolled pain    Complete by:  As directed    Call MD for:  temperature >100.4    Complete by:  As directed    Diet - low sodium heart healthy    Complete by:  As directed    Discharge instructions    Complete by:  As directed    Follow up with PCP in 1-2 weeks. Monitor BP at home   Increase activity slowly    Complete by:  As directed        Medication List    TAKE these medications   acetaminophen 500 MG tablet Commonly known as:  TYLENOL Take 500-1,000 mg by mouth every 6 (six) hours as needed for mild pain.   ADDERALL 20 MG tablet Generic drug:  amphetamine-dextroamphetamine Take 20 mg by mouth daily.   aspirin 81 MG EC tablet Take 1 tablet (81 mg total) by mouth daily. Start taking on:  09/14/2016   bisacodyl 5 MG EC tablet Commonly known as:  DULCOLAX Take 10 mg by mouth at bedtime as needed for moderate constipation (constipation).   cholecalciferol 1000 units tablet Commonly known as:  VITAMIN D Take 1,000 Units by mouth daily.   cyclobenzaprine 5 MG tablet Commonly known as:  FLEXERIL Take 5 mg by mouth 3 (three) times daily.   EPIPEN 2-PAK 0.3 mg/0.3 mL Soaj injection Generic drug:  EPINEPHrine Inject  0.3 mg into the muscle once as needed (severe allergic reaction).   ibuprofen 600 MG tablet Commonly known as:  ADVIL,MOTRIN Take 600 mg by mouth every 6 (six) hours as needed for moderate pain.   lamoTRIgine 100 MG tablet Commonly known as:  LAMICTAL Take 100 mg by mouth daily.   levothyroxine 25 MCG tablet Commonly known as:  SYNTHROID, LEVOTHROID Take 1 tablet (25 mcg total) by mouth daily before breakfast.   lurasidone 40 MG Tabs tablet Commonly known as:  LATUDA Take 40 mg  by mouth daily with breakfast.   Oxycodone HCl 10 MG Tabs Take 10 mg by mouth every 6 (six) hours. scheduled   traMADol 50 MG tablet Commonly known as:  ULTRAM Take 1 tablet (50 mg total) by mouth every 6 (six) hours as needed.   traZODone 100 MG tablet Commonly known as:  DESYREL Take 1 tablet (100 mg total) by mouth at bedtime as needed for sleep.   valACYclovir 500 MG tablet Commonly known as:  VALTREX Take 500 mg by mouth 2 (two) times daily.   vitamin B-12 1000 MCG tablet Commonly known as:  CYANOCOBALAMIN Take 1,000 mcg by mouth daily.      Follow-up Information    Sandi Mariscal, MD. Schedule an appointment as soon as possible for a visit in 1 week(s).   Specialty:  Internal Medicine Contact information: [place] [address] [phone]          Allergies  Allergen Reactions  . Naproxen Anaphylaxis    Consultations: neurologist  Procedures/Studies: MRI, MRA, CUS  Subjective:Patient was seen and examined at bedside. Patient reported feeling good today. Denied headache, dizziness, lightheadedness, nausea, vomiting, chest pain or shortness of breath.   Discharge Exam: Vitals:   09/13/16 0539 09/13/16 0830  BP: 108/61 (!) 99/52  Pulse: 87 76  Resp: 18 16  Temp: 98.7 F (37.1 C) 97.6 F (36.4 C)   Vitals:   09/12/16 2146 09/13/16 0033 09/13/16 0539 09/13/16 0830  BP: (!) 112/57 (!) 118/41 108/61 (!) 99/52  Pulse: 83 80 87 76  Resp: 18 18 18 16   Temp: 98.8 F (37.1 C) 98.4 F (36.9 C) 98.7 F (37.1 C) 97.6 F (36.4 C)  TempSrc: Oral Oral Oral Oral  SpO2: 100% 100% 100% 100%  Weight:      Height:        General: Pt is alert, awake, not in acute distress No carotid bruit Cardiovascular: RRR, S1/S2 +, no rubs, no gallops Respiratory: CTA bilaterally, no wheezing, no rhonchi Abdominal: Soft, NT, ND, bowel sounds + Extremities: no edema, no cyanosis    The results of significant diagnostics from this hospitalization  (including imaging, microbiology, ancillary and laboratory) are listed below for reference.     Microbiology: No results found for this or any previous visit (from the past 240 hour(s)).   Labs: BNP (last 3 results) No results for input(s): BNP in the last 8760 hours. Basic Metabolic Panel:  Recent Labs Lab 09/11/16 1953 09/11/16 2000  NA 138 138  K 3.9 3.9  CL 102 99*  CO2 30  --   GLUCOSE 94 88  BUN 13 14  CREATININE 0.89 0.90  CALCIUM 9.4  --    Liver Function Tests:  Recent Labs Lab 09/11/16 1953  AST 24  ALT 15  ALKPHOS 50  BILITOT 0.4  PROT 6.2*  ALBUMIN 4.1   No results for input(s): LIPASE, AMYLASE in the last 168 hours. No results for input(s): AMMONIA  in the last 168 hours. CBC:  Recent Labs Lab 09/11/16 1953 09/11/16 2000  WBC 8.2  --   NEUTROABS 5.0  --   HGB 12.3 12.9  HCT 36.4 38.0  MCV 94.3  --   PLT 185  --    Cardiac Enzymes: No results for input(s): CKTOTAL, CKMB, CKMBINDEX, TROPONINI in the last 168 hours. BNP: Invalid input(s): POCBNP CBG:  Recent Labs Lab 09/11/16 2314 09/12/16 0636 09/12/16 1121 09/12/16 1628 09/13/16 0626  GLUCAP 96 78 109* 85 91   D-Dimer No results for input(s): DDIMER in the last 72 hours. Hgb A1c No results for input(s): HGBA1C in the last 72 hours. Lipid Profile  Recent Labs  09/12/16 0448  CHOL 120  HDL 55  LDLCALC 52  TRIG 65  CHOLHDL 2.2   Thyroid function studies No results for input(s): TSH, T4TOTAL, T3FREE, THYROIDAB in the last 72 hours.  Invalid input(s): FREET3 Anemia work up No results for input(s): VITAMINB12, FOLATE, FERRITIN, TIBC, IRON, RETICCTPCT in the last 72 hours. Urinalysis    Component Value Date/Time   COLORURINE YELLOW 09/12/2016 [place] 09/12/2016 0447   LABSPEC 1.010 09/12/2016 0447   PHURINE 6.5 09/12/2016 [place] 09/12/2016 0447   HGBUR NEGATIVE 09/12/2016 0447   BILIRUBINUR NEGATIVE 09/12/2016 0447   KETONESUR  NEGATIVE 09/12/2016 0447   PROTEINUR NEGATIVE 09/12/2016 0447   UROBILINOGEN 0.2 05/26/2015 0722   NITRITE NEGATIVE 09/12/2016 0447   LEUKOCYTESUR NEGATIVE 09/12/2016 0447   Sepsis Labs Invalid input(s): PROCALCITONIN,  WBC,  LACTICIDVEN Microbiology No results found for this or any previous visit (from the past 240 hour(s)).   Time coordinating discharge: 26 minutes  SIGNED:   Rosita Fire, MD  [facility] 09/13/2016, 11:44 AM  If 7PM-7AM, please contact night-coverage [email or web address] Password [email or web address]

## 2016-09-13 NOTE — Progress Notes (Signed)
STROKE TEAM PROGRESS NOTE   HISTORY OF PRESENT ILLNESS (per record) [name] is an 50 y.o. female who presented from home via EMS for c/c right sided headache, blurred vision and slurred speech of sudden onset. LKN 1730. Her PMHx listed in EPIC includes anxiety, bipolar disorder, depression and seizures. She states that her symptoms have significantly improved at time of CT scanning, with residual right face and arm paresthesias and mild right sided sensory loss. Headache has decreased to 5/10, is frontal, nonthrobbing and worsened by light. She states she has a prior history of migraine with aura, although this is not listed in EPIC. She was unsure at time of onset if her symptoms were due to an anxiety attack or a stroke.    SUBJECTIVE (INTERVAL HISTORY) No family members present. The patient reports that the right side of her face is no longer numb.    OBJECTIVE Temp:  [97.6 F (36.4 C)-98.8 F (37.1 C)] 97.6 F (36.4 C) (11/19 0830) Pulse Rate:  [76-87] 76 (11/19 0830) Cardiac Rhythm: Normal sinus rhythm (11/19 0700) Resp:  [16-18] 16 (11/19 0830) BP: (99-118)/(41-61) 99/52 (11/19 0830) SpO2:  [98 %-100 %] 100 % (11/19 0830)  CBC:   Recent Labs Lab 09/11/16 1953 09/11/16 2000  WBC 8.2  --   NEUTROABS 5.0  --   HGB 12.3 12.9  HCT 36.4 38.0  MCV 94.3  --   PLT 185  --     Basic Metabolic Panel:   Recent Labs Lab 09/11/16 1953 09/11/16 2000  NA 138 138  K 3.9 3.9  CL 102 99*  CO2 30  --   GLUCOSE 94 88  BUN 13 14  CREATININE 0.89 0.90  CALCIUM 9.4  --     Lipid Panel:     Component Value Date/Time   CHOL 120 09/12/2016 0448   TRIG 65 09/12/2016 0448   HDL 55 09/12/2016 0448   CHOLHDL 2.2 09/12/2016 0448   VLDL 13 09/12/2016 0448   LDLCALC 52 09/12/2016 0448   HgbA1c: No results found for: HGBA1C Urine Drug Screen:     Component Value Date/Time   LABOPIA POSITIVE (A) 09/12/2016 [place] DETECTED 09/12/2016 [place] 02/14/2008 2140   LABBENZ NONE DETECTED 09/12/2016 0447   LABBENZ (A) 02/14/2008 2140    POSITIVE (NOTE) Result repeated and verified. Sent for confirmatory testing   AMPHETMU POSITIVE (A) 09/12/2016 0447   THCU NONE DETECTED 09/12/2016 0447   LABBARB NONE DETECTED 09/12/2016 0447      IMAGING  Dg Chest 2 View 09/11/2016 No acute cardiopulmonary process seen.     Mr Jodene Nam Head/brain Wo Cm 09/12/2016 1. Normal MRI of the brain for age.  2. Normal MRA of the circle of Willis and intracranial arteries.     Ct Head Code Stroke Wo Contrast 09/11/2016 1. No acute intracranial abnormality identified. If symptoms persist or if clinically indicated MRI is more sensitive for acute stroke.  2. ASPECTS is 10     PHYSICAL EXAM HEENT-  Normocephalic/atraumatic. No nuchal rigidity.  Lungs- Respirations unlabored. No gross wheezes.  Extremities- Warm and well-perfused  Neurological Examination Mental Status: Alert, oriented. Mild tangentiality. Anxious affect. Able to answer all questions and follow all commands. Speech fluent with intact naming.   Cranial Nerves: II: Visual fields intact to confrontation, pupils equal, round and reactive to light  III,IV, VI: ptosis not present, EOMI without nystagmus V,VII: smile symmetric. Decreased FT right side of face  VIII: hearing intact to conversation IX,X: uvula rises symmetrically XI: bilateral shoulder shrug normal XII: midline tongue extension Motor:  Right :  Upper extremity   5/5                                      Left:     Upper extremity   5/5             Lower extremity   5/5                                                  Lower extremity   5/5 Normal tone throughout; no atrophy noted Sensory: Temperature sensation decreased RUE and RLE, normal on left. FT intact x 4. No extinction.   Deep Tendon Reflexes: 3+ and symmetric throughout Plantars: Right: downgoing                           Left: downgoing Cerebellar: No  ataxia with FNF  Gait: Deferred    ASSESSMENT/PLAN [name] is a 50 y.o. female with history of anxiety, migraine headaches, bipolar disorder, hypothyroidism, depression and seizures, presenting with a right-sided headache, blurred vision, slurred speech, and right hemisensory deficits. She did not receive IV t-PA due to mild, improving deficits  Likely atypical migraine:    Resultant   No deficits  MRI - normal  MRA - normal  Carotid Doppler - pending  2D Echo - 11/21/2015 - EF 55-60%. No cardiac source of emboli identified.  LDL - 52  HgbA1c pending  VTE prophylaxis - Lovenox Diet Heart Room service appropriate? Yes; Fluid consistency: Thin Diet - low sodium heart healthy  No antithrombotic prior to admission, now on aspirin 81 mg daily  Patient counseled to be compliant with her antithrombotic medications  Ongoing aggressive stroke risk factor management  Therapy recommendations: none Disposition: home Hypertension  Blood pressure tends to run low  Permissive hypertension (OK if < 220/120) but gradually normalize in 5-7 days  Long-term BP goal normotensive  Hyperlipidemia  Home meds: No lipid lowering medications prior to admission  LDL 52, goal < 70  Continue statin at discharge   Other Stroke Risk Factors  Migraines   Other Active Problems   UDS - positive for benzodiazepines, opiates, and amphetamines. (Listed as prescription medications for this patient with bipolar disorder except for benzodiazepines - but received Ativan at time of admission)  Mild hypotension  Hospital day # 0  Mikey Bussing PA-C [facility] Pager [phone] 09/13/2016, 11:53 AM I have personally examined this patient, reviewed notes, independently viewed imaging studies, participated in medical decision making and plan of care.ROS completed by me personally and pertinent positives fully documented  I have made any additions or  clarifications directly to the above note. Agree with note above. She presented with right sided paresthesias and headache possibly complicated migraine.await carotid doppler and HbA1cs. Greater than 50 % time during this 15 minute visit was spent on counselling and coordination of care about stroke and migraine.  Antony Contras, MD Medical Director [facility] Pager: [phone] 09/13/2016 11:53 AM   To contact Stroke Continuity provider, please refer to [email or web address] After hours, contact General Neurology

## 2016-09-13 NOTE — Progress Notes (Signed)
Pt ambulates independently in the hallway with no noted distress. Will continue to monitor.

## 2016-09-13 NOTE — Progress Notes (Signed)
Pt discharged at this time with her daughter taking all personal belongings. IV discontinued, dry dressing applied. Discharge instructions provided with verbal understanding. Pt to follow up with PCP per dc summary. No noted distress.

## 2016-09-13 NOTE — Progress Notes (Signed)
VASCULAR LAB PRELIMINARY  PRELIMINARY  PRELIMINARY  PRELIMINARY  Carotid duplex completed.    Preliminary report:  1-39% ICA plaquing.  Vertebral artery flow is antegrade.   Adrieanna Boteler, RVT 09/13/2016, 10:49 AM

## 2016-09-13 NOTE — Evaluation (Signed)
Occupational Therapy Evaluation and Discharge Patient Details Name: [name] MRN: [id number] DOB: 1966-01-24 Today's Date: 09/13/2016    History of Present Illness Patient is a 50 yo female admitted 09/11/16 with decreased sensation Rt and decreased speech, to r/o CVA.    PMH:  syncope, depression, anxiety, DDD, C-spine surgery x3, ETOH abuse, HOH, seizures. MRI on 11/18 negative for acute infarct.    Clinical Impression   Pt reports she was independent with ADL and mobility PTA. Currently pt overall mod I with ADL and functional mobility without unsteadiness or LOB. Educated pt on BE FAST. Pt planning to d/c home with intermittent family supervision. No further acute OT needs identified; signing off at this time. Please re-consult if needs change. Thank you for this referral.    Follow Up Recommendations  No OT follow up    Equipment Recommendations  None recommended by OT    Recommendations for Other Services       Precautions / Restrictions Precautions Precautions: None Restrictions Weight Bearing Restrictions: No      Mobility Bed Mobility Overal bed mobility: Independent                Transfers Overall transfer level: Modified independent Equipment used: None             General transfer comment: Increased time    Balance Overall balance assessment: No apparent balance deficits (not formally assessed)                                          ADL Overall ADL's : Modified independent                                       General ADL Comments: Pt able to perform ADL and functional mobility without unsteadiness or LOB. Educated on BE FAST.     Vision Vision Assessment?: No apparent visual deficits   Perception     Praxis      Pertinent Vitals/Pain Pain Assessment: No/denies pain     Hand Dominance Right   Extremity/Trunk Assessment Upper Extremity Assessment Upper Extremity Assessment:  Overall WFL for tasks assessed   Lower Extremity Assessment Lower Extremity Assessment: Overall WFL for tasks assessed   Cervical / Trunk Assessment Cervical / Trunk Assessment: Normal   Communication Communication Communication: No difficulties   Cognition Arousal/Alertness: Awake/alert Behavior During Therapy: WFL for tasks assessed/performed;Flat affect Overall Cognitive Status: Within Functional Limits for tasks assessed                     General Comments       Exercises       Shoulder Instructions      Home Living Family/patient expects to be discharged to:: Private residence Living Arrangements: Parent;Children Available Help at Discharge: Family;Available PRN/intermittently Type of Home: House Home Access: Stairs to enter [employer] of Steps: 5 Entrance Stairs-Rails: Right;Left Home Layout: One level     Bathroom Shower/Tub: Tub/shower unit Shower/tub characteristics: Architectural technologist: Standard     Home Equipment: None          Prior Functioning/Environment Level of Independence: Independent        Comments: Drives, cooks. cleans.        OT Problem List:     OT Treatment/Interventions:  OT Goals(Current goals can be found in the care plan section) Acute Rehab OT Goals Patient Stated Goal: return home OT Goal Formulation: All assessment and education complete, DC therapy  OT Frequency:     Barriers to D/C:            Co-evaluation              End of Session Nurse Communication: Mobility status  Activity Tolerance: Patient tolerated treatment well Patient left: in bed;with call bell/phone within reach   Time: 0950-1005 OT Time Calculation (min): 15 min Charges:  OT General Charges $OT Visit: 1 Procedure OT Evaluation $OT Eval Low Complexity: 1 Procedure G-Codes: OT G-codes **NOT FOR INPATIENT CLASS** Functional Assessment Tool Used: Clinical judgement Functional Limitation: Self care Self  Care Current Status [id number]): At least 1 percent but less than 20 percent impaired, limited or restricted Self Care Goal Status [id number]): At least 1 percent but less than 20 percent impaired, limited or restricted Self Care Discharge Status [phone]): At least 1 percent but less than 20 percent impaired, limited or restricted   Binnie Kand M.S., OTR/L Pager: [phone]  09/13/2016, 10:09 AM

## 2016-09-16 LAB — VAS US CAROTID
LCCADDIAS: 26 cm/s
LCCADSYS: 72 cm/s
LCCAPSYS: 86 cm/s
LEFT ECA DIAS: -19 cm/s
LEFT VERTEBRAL DIAS: -16 cm/s
LICADSYS: -122 cm/s
Left CCA prox dias: 29 cm/s
Left ICA dist dias: -52 cm/s
Left ICA prox dias: -40 cm/s
Left ICA prox sys: -91 cm/s
RCCADSYS: -83 cm/s
RCCAPDIAS: 34 cm/s
RCCAPSYS: 88 cm/s
RIGHT ECA DIAS: -23 cm/s
RIGHT VERTEBRAL DIAS: -19 cm/s

## 2017-01-28 ENCOUNTER — Encounter: Payer: Self-pay | Admitting: Internal Medicine

## 2017-02-13 ENCOUNTER — Emergency Department: Payer: Medicare HMO

## 2017-02-13 ENCOUNTER — Emergency Department
Admission: EM | Admit: 2017-02-13 | Discharge: 2017-02-13 | Disposition: A | Discharge status: Home / Self Care | Payer: Medicare HMO | Attending: Emergency Medicine | Admitting: Emergency Medicine

## 2017-02-13 ENCOUNTER — Encounter: Payer: Self-pay | Admitting: Emergency Medicine

## 2017-02-13 DIAGNOSIS — Z79899 Other long term (current) drug therapy: Secondary | ICD-10-CM | POA: Insufficient documentation

## 2017-02-13 DIAGNOSIS — E039 Hypothyroidism, unspecified: Secondary | ICD-10-CM | POA: Diagnosis not present

## 2017-02-13 DIAGNOSIS — Z7982 Long term (current) use of aspirin: Secondary | ICD-10-CM | POA: Diagnosis not present

## 2017-02-13 DIAGNOSIS — R1084 Generalized abdominal pain: Secondary | ICD-10-CM

## 2017-02-13 DIAGNOSIS — A084 Viral intestinal infection, unspecified: Secondary | ICD-10-CM | POA: Insufficient documentation

## 2017-02-13 LAB — COMPREHENSIVE METABOLIC PANEL
ALBUMIN: 3.7 g/dL (ref 3.5–5.0)
ALT: 19 U/L (ref 14–54)
ANION GAP: 6 (ref 5–15)
AST: 22 U/L (ref 15–41)
Alkaline Phosphatase: 61 U/L (ref 38–126)
BUN: 14 mg/dL (ref 6–20)
CO2: 29 mmol/L (ref 22–32)
Calcium: 8.8 mg/dL — ABNORMAL LOW (ref 8.9–10.3)
Chloride: 101 mmol/L (ref 101–111)
Creatinine, Ser: 0.76 mg/dL (ref 0.44–1.00)
GFR calc Af Amer: >60 mL/min (ref >60)
GFR calc non Af Amer: >60 mL/min (ref >60)
Glucose, Bld: 113 mg/dL — ABNORMAL HIGH (ref 65–99)
POTASSIUM: 4.6 mmol/L (ref 3.5–5.1)
SODIUM: 136 mmol/L (ref 135–145)
Total Bilirubin: 0.5 mg/dL (ref 0.3–1.2)
Total Protein: 6.9 g/dL (ref 6.5–8.1)

## 2017-02-13 LAB — CBC WITH DIFFERENTIAL/PLATELET
Basophils Absolute: 0 10*3/uL (ref 0–0.1)
Basophils Relative: 0 %
EOS ABS: 0 10*3/uL (ref 0–0.7)
EOS PCT: 0 %
HCT: 43.1 % (ref 35.0–47.0)
Hemoglobin: 14.9 g/dL (ref 12.0–16.0)
LYMPHS ABS: 1 10*3/uL (ref 1.0–3.6)
LYMPHS PCT: 12 %
MCH: 32.9 pg (ref 26.0–34.0)
MCHC: 34.6 g/dL (ref 32.0–36.0)
MCV: 95.1 fL (ref 80.0–100.0)
MONO ABS: 0.5 10*3/uL (ref 0.2–0.9)
Monocytes Relative: 6 %
Neutro Abs: 7 10*3/uL — ABNORMAL HIGH (ref 1.4–6.5)
Neutrophils Relative %: 82 %
PLATELETS: 283 10*3/uL (ref 150–440)
RBC: 4.53 MIL/uL (ref 3.80–5.20)
RDW: 13.1 % (ref 11.5–14.5)
WBC: 8.6 10*3/uL (ref 3.6–11.0)

## 2017-02-13 LAB — LIPASE, BLOOD: Lipase: 17 U/L (ref 11–51)

## 2017-02-13 MED ORDER — IOPAMIDOL (ISOVUE-300) INJECTION 61%
75.0000 mL | Freq: Once | INTRAVENOUS | Status: AC | PRN
  Administered 2017-02-13: 75 mL via INTRAVENOUS

## 2017-02-13 MED ORDER — ONDANSETRON HCL 4 MG/2ML IJ SOLN
4.0000 mg | Freq: Once | INTRAMUSCULAR | Status: AC
  Administered 2017-02-13: 4 mg via INTRAVENOUS
  Filled 2017-02-13: qty 2

## 2017-02-13 MED ORDER — SODIUM CHLORIDE 0.9 % IV BOLUS (SEPSIS)
1000.0000 mL | Freq: Once | INTRAVENOUS | Status: AC
  Administered 2017-02-13: 1000 mL via INTRAVENOUS

## 2017-02-13 MED ORDER — MORPHINE SULFATE (PF) 4 MG/ML IV SOLN
4.0000 mg | Freq: Once | INTRAVENOUS | Status: AC
  Administered 2017-02-13: 4 mg via INTRAVENOUS
  Filled 2017-02-13: qty 1

## 2017-02-13 MED ORDER — ACETAMINOPHEN 10 MG/ML IV SOLN
1000.0000 mg | INTRAVENOUS | Status: AC
  Administered 2017-02-13: 1000 mg via INTRAVENOUS
  Filled 2017-02-13: qty 100

## 2017-02-13 MED ORDER — LOPERAMIDE HCL 2 MG PO CAPS
4.0000 mg | ORAL_CAPSULE | Freq: Once | ORAL | Status: AC
  Administered 2017-02-13: 4 mg via ORAL
  Filled 2017-02-13: qty 2

## 2017-02-13 MED ORDER — LEVOFLOXACIN 500 MG PO TABS: 500.0000 mg | ORAL_TABLET | Freq: Every day | ORAL | 0 refills | Status: AC

## 2017-02-13 MED ORDER — FAMOTIDINE 20 MG PO TABS: 20.0000 mg | ORAL_TABLET | Freq: Two times a day (BID) | ORAL | 0 refills | Status: DC

## 2017-02-13 MED ORDER — ONDANSETRON 4 MG PO TBDP: 4.0000 mg | ORAL_TABLET | Freq: Three times a day (TID) | ORAL | 0 refills | Status: DC | PRN

## 2017-02-13 NOTE — ED Provider Notes (Signed)
[facility] Emergency Department Provider Note  ____________________________________________  Time seen: Approximately 5:18 PM  I have reviewed the triage vital signs and the nursing notes.   HISTORY  Chief Complaint Abdominal Pain    HPI [name] is a 51 y.o. female who complains of right-sided abdominal pain for the past 2 days. No aggravating or alleviating factors. Constant, gradual onset. Nonradiating. Associated nausea vomiting and diarrhea. No fever or chills. Never had anything like this before. No trauma. No sick contacts. No recent illness that she knows of.     Past Medical History:  Diagnosis Date  . Allergy   . Anxiety   . Bipolar disorder ([place])   . DDD (degenerative disc disease)   . Depression   . HSV infection   . Hx: UTI (urinary tract infection)   . Pertussis   . Seizures ([place])    last seizure 12/2014   . Thyroid disease      Patient Active Problem List   Diagnosis Date Noted  . Hypotension 09/12/2016  . Right facial numbness 09/11/2016  . Stroke-like symptom 09/11/2016  . Seizure ([place]) 09/11/2016  . Hypothyroidism 09/11/2016  . Stroke-like symptoms 09/11/2016  . Fall   . Syncope 11/20/2015  . Scalp laceration 11/20/2015  . Major depressive disorder, recurrent severe without psychotic features ([place]) 03/23/2015  . DDD (degenerative disc disease)   . ACUTE BRONCHITIS 10/22/2009  . GERD 08/02/2009  . DEGENERATIVE DISC DISEASE 08/02/2009  . COUGH 08/02/2009  . Bipolar disorder ([place]) 08/01/2009  . Alcoholism ([place]) 08/01/2009  . BULIMIA 08/01/2009  . HEARING LOSS, BILATERAL 08/01/2009  . OSTEOPENIA 08/01/2009  . SEIZURE DISORDER 08/01/2009     Past Surgical History:  Procedure Laterality Date  . CERVICAL SPINE SURGERY     x 3 2001,2005,2007 c5-6  . CESAREAN SECTION  2003  . TONSILLECTOMY    . TUBAL LIGATION    . WISDOM TOOTH EXTRACTION       Prior to Admission medications   Medication Sig Start  Date End Date Taking? Authorizing Provider  acetaminophen (TYLENOL) 500 MG tablet Take 500-1,000 mg by mouth every 6 (six) hours as needed for mild pain.     Historical Provider, MD  amphetamine-dextroamphetamine (ADDERALL) 20 MG tablet Take 20 mg by mouth daily.    Historical Provider, MD  aspirin EC 81 MG EC tablet Take 1 tablet (81 mg total) by mouth daily. 09/14/16   Dron Tanna Furry, MD  bisacodyl (DULCOLAX) 5 MG EC tablet Take 10 mg by mouth at bedtime as needed for moderate constipation (constipation).    Historical Provider, MD  cholecalciferol (VITAMIN D) 1000 units tablet Take 1,000 Units by mouth daily.    Historical Provider, MD  cyclobenzaprine (FLEXERIL) 5 MG tablet Take 5 mg by mouth 3 (three) times daily. 09/09/16   Historical Provider, MD  EPINEPHrine (EPIPEN 2-PAK) 0.3 mg/0.3 mL IJ SOAJ injection Inject 0.3 mg into the muscle once as needed (severe allergic reaction).    Historical Provider, MD  famotidine (PEPCID) 20 MG tablet Take 1 tablet (20 mg total) by mouth 2 (two) times daily. 02/13/17   Carrie Mew, MD  ibuprofen (ADVIL,MOTRIN) 600 MG tablet Take 600 mg by mouth every 6 (six) hours as needed for moderate pain.     Historical Provider, MD  lamoTRIgine (LAMICTAL) 100 MG tablet Take 100 mg by mouth daily.  08/02/15   Historical Provider, MD  levofloxacin (LEVAQUIN) 500 MG tablet Take 1 tablet (500 mg total) by mouth  daily. 02/13/17 02/23/17  Carrie Mew, MD  levothyroxine (SYNTHROID, LEVOTHROID) 25 MCG tablet Take 1 tablet (25 mcg total) by mouth daily before breakfast. 03/28/15   Kerrie Buffalo, NP  lurasidone (LATUDA) 40 MG TABS tablet Take 40 mg by mouth daily with breakfast.    Historical Provider, MD  ondansetron (ZOFRAN ODT) 4 MG disintegrating tablet Take 1 tablet (4 mg total) by mouth every 8 (eight) hours as needed for nausea or vomiting. 02/13/17   Carrie Mew, MD  Oxycodone HCl 10 MG TABS Take 10 mg by mouth every 6 (six) hours. scheduled 09/09/16    Historical Provider, MD  traMADol (ULTRAM) 50 MG tablet Take 1 tablet (50 mg total) by mouth every 6 (six) hours as needed. Patient not taking: Reported on 09/12/2016 9/56/38   Delora Fuel, MD  traZODone (DESYREL) 100 MG tablet Take 1 tablet (100 mg total) by mouth at bedtime as needed for sleep. 11/22/15   Reyne Dumas, MD  valACYclovir (VALTREX) 500 MG tablet Take 500 mg by mouth 2 (two) times daily.    Historical Provider, MD  vitamin B-12 (CYANOCOBALAMIN) 1000 MCG tablet Take 1,000 mcg by mouth daily.    Historical Provider, MD     Allergies Naproxen   Family History  Problem Relation Age of Onset  . Diabetes Mellitus II Neg Hx   . CAD Neg Hx   . Colon cancer Neg Hx   . Colon polyps Neg Hx   . Heart disease Neg Hx   . Rectal cancer Neg Hx   . Stomach cancer Neg Hx     Social History Social History  Substance Use Topics  . Smoking status: Never Smoker  . Smokeless tobacco: Never Used  . Alcohol use No     Comment: quit drinking 7 months    Review of Systems  Constitutional:   No fever or chills.  ENT:   No sore throat. No rhinorrhea. Cardiovascular:   No chest pain. Respiratory:   No dyspnea or cough. Gastrointestinal:   Positive for abdominal pain, vomiting and diarrhea.  Genitourinary:   Negative for dysuria or difficulty urinating. Musculoskeletal:   Negative for focal pain or swelling Neurological:   Negative for headaches 10-point ROS otherwise negative.  ____________________________________________   PHYSICAL EXAM:  VITAL SIGNS: ED Triage Vitals  Enc Vitals Group     BP --      Pulse --      Resp --      Temp 02/13/17 1617 98.3 F (36.8 C)     Temp Source 02/13/17 1617 Oral     SpO2 02/13/17 1613 100 %     Weight 02/13/17 1617 117 lb (53.1 kg)     Height 02/13/17 1617 5\' 1"  (1.549 m)     Head Circumference --      Peak Flow --      Pain Score 02/13/17 1617 8     Pain Loc --      Pain Edu? --      Excl. in [place]? --     Vital signs reviewed,  nursing assessments reviewed.   Constitutional:   Alert and oriented. Well appearing and in no distress. Eyes:   No scleral icterus. No conjunctival pallor. PERRL. EOMI.  No nystagmus. ENT   Head:   Normocephalic and atraumatic.   Nose:   No congestion/rhinnorhea. No septal hematoma   Mouth/Throat:   MMM, no pharyngeal erythema. No peritonsillar mass.    Neck:   No stridor. No SubQ emphysema.  No meningismus. Hematological/Lymphatic/Immunilogical:   No cervical lymphadenopathy. Cardiovascular:   RRR. Symmetric bilateral radial and DP pulses.  No murmurs.  Respiratory:   Normal respiratory effort without tachypnea nor retractions. Breath sounds are clear and equal bilaterally. No wheezes/rales/rhonchi. Gastrointestinal:   Soft With generalized tenderness. Non distended. There is no CVA tenderness.  No rebound, rigidity, or guarding. Genitourinary:   deferred Musculoskeletal:   Normal range of motion in all extremities. No joint effusions.  No lower extremity tenderness.  No edema. Neurologic:   Normal speech and language.  CN 2-10 normal. Motor grossly intact. No gross focal neurologic deficits are appreciated.  Skin:    Skin is warm, dry and intact. No rash noted.  No petechiae, purpura, or bullae.  ____________________________________________    LABS (pertinent positives/negatives) (all labs ordered are listed, but only abnormal results are displayed) Labs Reviewed  CBC WITH DIFFERENTIAL/PLATELET - Abnormal; Notable for the following:       Result Value   Neutro Abs 7.0 (*)    All other components within normal limits  COMPREHENSIVE METABOLIC PANEL - Abnormal; Notable for the following:    Glucose, Bld 113 (*)    Calcium 8.8 (*)    All other components within normal limits  LIPASE, BLOOD   ____________________________________________   EKG    ____________________________________________    RADIOLOGY  Ct Abdomen Pelvis W Contrast  Result Date:  02/13/2017 CLINICAL DATA:  51 year old female with history of nausea of vomiting and abdominal pain for the past 2 days. Right upper quadrant abdominal pain and umbilical pain. EXAM: CT ABDOMEN AND PELVIS WITH CONTRAST TECHNIQUE: Multidetector CT imaging of the abdomen and pelvis was performed using the standard protocol following bolus administration of intravenous contrast. CONTRAST:  64mL ISOVUE-300 IOPAMIDOL (ISOVUE-300) INJECTION 61% COMPARISON:  CT the abdomen and pelvis 10/14/2015. FINDINGS: Lower chest: Unremarkable. Hepatobiliary: Subcentimeter low-attenuation lesion in segment 2 of the liver is too small to characterize, but statistically likely to represent a tiny cyst. No other larger more suspicious appearing cystic or solid hepatic lesions. No intra or extrahepatic biliary ductal dilatation. Gallbladder is nearly decompressed, but otherwise unremarkable in appearance. Pancreas: No pancreatic mass. No pancreatic ductal dilatation. No pancreatic or peripancreatic fluid or inflammatory changes. Spleen: Unremarkable. Adrenals/Urinary Tract: Bilateral adrenal glands and bilateral kidneys are normal in appearance. No hydroureteronephrosis. Urinary bladder is nearly completely decompressed, but otherwise unremarkable in appearance. Stomach/Bowel: Normal appearance of the stomach. No pathologic dilatation of small bowel or colon. Colonic wall appears diffusely thickened with hyperenhancement of the mucosa, suggesting underlying colitis. The appendix is not confidently identified and may be surgically absent. Regardless, there are no inflammatory changes noted adjacent to the cecum to suggest the presence of an acute appendicitis at this time. Vascular/Lymphatic: Atherosclerosis of the abdominal aorta, without evidence of aneurysm or dissection in the abdominal or pelvic vasculature. No lymphadenopathy noted in the abdomen or pelvis. Reproductive: Uterus and ovaries are unremarkable in appearance. Other: No  significant volume of ascites.  No pneumoperitoneum. Musculoskeletal: There are no aggressive appearing lytic or blastic lesions noted in the visualized portions of the skeleton. IMPRESSION: 1. Diffuse colonic wall thickening and hyper enhancement of the colonic mucosa, concerning for a mild acute colitis. 2. Aortic atherosclerosis. Electronically Signed   By: Vinnie Langton M.D.   On: 02/13/2017 19:36    ____________________________________________   PROCEDURES Procedures  ____________________________________________   INITIAL IMPRESSION / ASSESSMENT AND PLAN / ED COURSE  Pertinent labs & imaging results that were available during my care  of the patient were reviewed by me and considered in my medical decision making (see chart for details).  Patient presents with right-sided abdominal pain, generalized tenderness. Well-appearing, unremarkable vital signs. We'll check labs urinalysis. If workup negative, and symptoms are controlled with IV fluids and analgesics, patient should be suitable for outpatient follow-up. Most likely she has a viral gastroenteritis.Considering the patient's symptoms, medical history, and physical examination today, I have low suspicion for cholecystitis or biliary pathology, pancreatitis, perforation or bowel obstruction, hernia, intra-abdominal abscess, AAA or dissection, volvulus or intussusception, mesenteric ischemia, or appendicitis.       Clinical Course as of Feb 14 2004  Sat Feb 13, 2017  1806 Labs negative. Po trial. Loperamide for sx  [PS]  1857 Failed PO trial. Still tender. Will get CT despite nl labs.   [PS]    Clinical Course User Index [PS] Carrie Mew, MD     ----------------------------------------- 8:05 PM on 02/13/2017 -----------------------------------------  CT negative, consistent with gastroenteritis or mild colitis, likely viral. Vital signs remained normal. Feeling better. Discharge with symptomatic relief, follow  primary care 2 days.  ____________________________________________   FINAL CLINICAL IMPRESSION(S) / ED DIAGNOSES  Final diagnoses:  Generalized abdominal pain  Viral gastroenteritis      New Prescriptions   FAMOTIDINE (PEPCID) 20 MG TABLET    Take 1 tablet (20 mg total) by mouth 2 (two) times daily.   LEVOFLOXACIN (LEVAQUIN) 500 MG TABLET    Take 1 tablet (500 mg total) by mouth daily.   ONDANSETRON (ZOFRAN ODT) 4 MG DISINTEGRATING TABLET    Take 1 tablet (4 mg total) by mouth every 8 (eight) hours as needed for nausea or vomiting.     Portions of this note were generated with dragon dictation software. Dictation errors may occur despite best attempts at proofreading.    Carrie Mew, MD 02/13/17 2005

## 2017-02-13 NOTE — ED Notes (Signed)
Pt discharged to home.  Family member driving.  Discharge instructions reviewed.  Verbalized understanding.  No questions or concerns at this time.  Teach back verified.  Pt in NAD.  No items left in ED.   

## 2017-02-13 NOTE — Discharge Instructions (Signed)
Your lab tests and CT scan today were unremarkable.  You do have inflammation of the intestines from your illness, which is likely a virus.

## 2017-02-13 NOTE — ED Notes (Signed)
Pt in via [facility], stating she has n/v with abdominal pain.  Pt reports the pain started 2 days ago.  Pt reports pain to RUQ and umbilicus area of abdomen.  Pt also reports having diarrhea.  Pt states she has not been able to eat much or drink much in the past couple of days.  Pt alert & oriented, ambulatory from EMS stretcher to bed.

## 2017-02-13 NOTE — ED Notes (Signed)
Pt unable to tolerate PO challenge.  Pt vomited after drinking water.

## 2017-03-18 ENCOUNTER — Other Ambulatory Visit: Payer: Self-pay | Admitting: Internal Medicine

## 2017-03-18 DIAGNOSIS — Z1231 Encounter for screening mammogram for malignant neoplasm of breast: Secondary | ICD-10-CM

## 2017-04-09 ENCOUNTER — Ambulatory Visit: Payer: Self-pay

## 2017-04-22 ENCOUNTER — Ambulatory Visit
Admission: RE | Admit: 2017-04-22 | Discharge: 2017-04-22 | Disposition: A | Discharge status: Home / Self Care | Payer: Medicare HMO | Source: Ambulatory Visit | Attending: Internal Medicine | Admitting: Internal Medicine

## 2017-04-22 DIAGNOSIS — Z1231 Encounter for screening mammogram for malignant neoplasm of breast: Secondary | ICD-10-CM

## 2017-07-13 DIAGNOSIS — M791 Myalgia: Secondary | ICD-10-CM | POA: Diagnosis not present

## 2017-07-13 DIAGNOSIS — M5412 Radiculopathy, cervical region: Secondary | ICD-10-CM | POA: Diagnosis not present

## 2017-07-13 DIAGNOSIS — M5416 Radiculopathy, lumbar region: Secondary | ICD-10-CM | POA: Diagnosis not present

## 2017-07-13 DIAGNOSIS — M47816 Spondylosis without myelopathy or radiculopathy, lumbar region: Secondary | ICD-10-CM | POA: Diagnosis not present

## 2017-08-06 DIAGNOSIS — L237 Allergic contact dermatitis due to plants, except food: Secondary | ICD-10-CM | POA: Diagnosis not present

## 2017-08-06 DIAGNOSIS — N760 Acute vaginitis: Secondary | ICD-10-CM | POA: Diagnosis not present

## 2017-08-12 DIAGNOSIS — L237 Allergic contact dermatitis due to plants, except food: Secondary | ICD-10-CM | POA: Diagnosis not present

## 2017-09-01 DIAGNOSIS — R300 Dysuria: Secondary | ICD-10-CM | POA: Diagnosis not present

## 2017-09-01 DIAGNOSIS — N912 Amenorrhea, unspecified: Secondary | ICD-10-CM | POA: Diagnosis not present

## 2017-09-01 DIAGNOSIS — N390 Urinary tract infection, site not specified: Secondary | ICD-10-CM | POA: Diagnosis not present

## 2017-09-03 ENCOUNTER — Encounter: Payer: Self-pay | Admitting: Obstetrics and Gynecology

## 2017-09-03 ENCOUNTER — Ambulatory Visit (INDEPENDENT_AMBULATORY_CARE_PROVIDER_SITE_OTHER): Payer: Medicare HMO | Admitting: Obstetrics and Gynecology

## 2017-09-03 VITALS — BP 101/61 | HR 87 | Ht 61.0 in | Wt 118.4 lb

## 2017-09-03 DIAGNOSIS — N950 Postmenopausal bleeding: Secondary | ICD-10-CM | POA: Diagnosis not present

## 2017-09-03 MED ORDER — NORETHINDRONE ACETATE 5 MG PO TABS: 5.0000 mg | ORAL_TABLET | Freq: Three times a day (TID) | ORAL | 0 refills | Status: DC

## 2017-09-03 NOTE — Progress Notes (Signed)
HPI:      [name] is a 51 y.o. [phone] who LMP was No LMP recorded (lmp unknown). Patient is postmenopausal.  Subjective:   She presents today because she began bleeding very heavily 3 days ago and it has continued.  This bleeding has been associated with significant pelvic cramping.  Patient has been amenorrheic for 4 years and her menses stopped 4 years ago in a manner consistent with menopause. She has a tubal ligation for birth control. She reports her Pap smears were normal throughout her life.  Her last one was 3 years ago.    Hx: The following portions of the patient's history were reviewed and updated as appropriate:             She  has a past medical history of Abnormal uterine bleeding, Allergy, Anxiety, Bipolar disorder ([place]), DDD (degenerative disc disease), Depression, HSV infection, UTI (urinary tract infection), Pertussis, Seizures ([place]), and Thyroid disease. She does not have any pertinent problems on file. She  has a past surgical history that includes Cervical spine surgery; Cesarean section (2003); Tubal ligation; Tonsillectomy; and Wisdom tooth extraction. Her family history includes Cancer in her maternal grandmother and mother. She  reports that  has never smoked. she has never used smokeless tobacco. She reports that she does not drink alcohol or use drugs. She is allergic to naproxen.       Review of Systems:  Review of Systems  Constitutional: Denied constitutional symptoms, night sweats, recent illness, fatigue, fever, insomnia and weight loss.  Eyes: Denied eye symptoms, eye pain, photophobia, vision change and visual disturbance.  Ears/Nose/Throat/Neck: Denied ear, nose, throat or neck symptoms, hearing loss, nasal discharge, sinus congestion and sore throat.  Cardiovascular: Denied cardiovascular symptoms, arrhythmia, chest pain/pressure, edema, exercise intolerance, orthopnea and palpitations.  Respiratory: Denied pulmonary symptoms, asthma,  pleuritic pain, productive sputum, cough, dyspnea and wheezing.  Gastrointestinal: Denied, gastro-esophageal reflux, melena, nausea and vomiting.  Genitourinary: See HPI for additional information.  Musculoskeletal: Denied musculoskeletal symptoms, stiffness, swelling, muscle weakness and myalgia.  Dermatologic: Denied dermatology symptoms, rash and scar.  Neurologic: Denied neurology symptoms, dizziness, headache, neck pain and syncope.  Psychiatric: Denied psychiatric symptoms, anxiety and depression.  Endocrine: Denied endocrine symptoms including hot flashes and night sweats.   Meds:   Current Outpatient Medications on File Prior to Visit  Medication Sig Dispense Refill  . acetaminophen (TYLENOL) 500 MG tablet Take 500-1,000 mg by mouth every 6 (six) hours as needed for mild pain.     Marland Kitchen acyclovir (ZOVIRAX) 400 MG tablet TK 1 T PO BID  9  . amphetamine-dextroamphetamine (ADDERALL XR) 20 MG 24 hr capsule TK 1 C PO QAM  0  . aspirin EC 81 MG EC tablet Take 1 tablet (81 mg total) by mouth daily. 30 tablet 0  . bisacodyl (DULCOLAX) 5 MG EC tablet Take 10 mg by mouth at bedtime as needed for moderate constipation (constipation).    . cholecalciferol (VITAMIN D) 1000 units tablet Take 1,000 Units by mouth daily.    . clonazePAM (KLONOPIN) 1 MG tablet TK 1 T PO QD PRA  2  . clotrimazole (GYNE-LOTRIMIN) 1 % vaginal cream IVB FOR 7 DAYS  0  . cyclobenzaprine (FLEXERIL) 5 MG tablet Take 5 mg by mouth 3 (three) times daily.  2  . EPINEPHrine (EPIPEN 2-PAK) 0.3 mg/0.3 mL IJ SOAJ injection Inject 0.3 mg into the muscle once as needed (severe allergic reaction).    . hydrOXYzine (ATARAX/VISTARIL) 25 MG  tablet     . ibuprofen (ADVIL,MOTRIN) 600 MG tablet Take 600 mg by mouth every 6 (six) hours as needed for moderate pain.     Marland Kitchen lamoTRIgine (LAMICTAL) 100 MG tablet Take 100 mg by mouth daily.     Marland Kitchen LATUDA 80 MG TABS tablet TK 1 T PO D AFTER DINNER  1  . lurasidone (LATUDA) 40 MG TABS tablet Take 40 mg  by mouth daily with breakfast.    . nitrofurantoin, macrocrystal-monohydrate, (MACROBID) 100 MG capsule TK 1 C PO BID FOR 5 DAYS  0  . Oxycodone HCl 10 MG TABS Take 10 mg by mouth every 6 (six) hours. scheduled  0  . RELISTOR 150 MG TABS TK 3 TS PO QD IN THE MORNING  2  . traZODone (DESYREL) 100 MG tablet Take 1 tablet (100 mg total) by mouth at bedtime as needed for sleep. 30 tablet 0  . valACYclovir (VALTREX) 500 MG tablet Take 500 mg by mouth 2 (two) times daily.     No current facility-administered medications on file prior to visit.     Objective:     Vitals:   09/03/17 0815  BP: 101/61  Pulse: 87              She is declining examination today because of her heavy menstrual bleeding.  Assessment:    [id number] Patient Active Problem List   Diagnosis Date Noted  . Hypotension 09/12/2016  . Right facial numbness 09/11/2016  . Stroke-like symptom 09/11/2016  . Seizure ([place]) 09/11/2016  . Hypothyroidism 09/11/2016  . Stroke-like symptoms 09/11/2016  . Fall   . Syncope 11/20/2015  . Scalp laceration 11/20/2015  . Major depressive disorder, recurrent severe without psychotic features ([place]) 03/23/2015  . DDD (degenerative disc disease)   . ACUTE BRONCHITIS 10/22/2009  . GERD 08/02/2009  . DEGENERATIVE DISC DISEASE 08/02/2009  . COUGH 08/02/2009  . Bipolar disorder ([place]) 08/01/2009  . Alcoholism ([place]) 08/01/2009  . BULIMIA 08/01/2009  . HEARING LOSS, BILATERAL 08/01/2009  . OSTEOPENIA 08/01/2009  . SEIZURE DISORDER 08/01/2009     1. Postmenopausal bleeding        Plan:            1.  Pelvic ultrasound for endometrial thickness.  2.  Aygestin to stop bleeding.  3.  Recommend endometrial biopsy to be determined by ultrasound.  4.  Recommend Pap smear. Orders Orders Placed This Encounter  Procedures  . US PELVIS TRANSVANGINAL NON-OB (TV ONLY)     Meds ordered this encounter  Medications  . norethindrone (AYGESTIN) 5 MG tablet    Sig: Take 1 tablet (5 mg  total) 3 (three) times daily for 21 days by mouth. Until bleeding stops, then take 2 daily.    Dispense:  50 tablet    Refill:  0      F/U  No Follow-up on file. I spent 31 minutes with this patient of which greater than 50% was spent discussing postmenopausal bleeding, workup treatment.  Endometrial and cervical cancer as a source of bleeding.  Vaginal atrophy as a source of bleeding.  Discussed the patient's concerns regarding heavy bleeding and cramping.  Use of Aygestin discussed. Finis Bud, M.D. 09/03/2017 9:12 AM

## 2017-09-06 ENCOUNTER — Telehealth: Payer: Self-pay | Admitting: Obstetrics and Gynecology

## 2017-09-06 NOTE — Telephone Encounter (Signed)
VM from [facility] to call them about the patients script  [phone] - [name]

## 2017-09-08 ENCOUNTER — Ambulatory Visit (INDEPENDENT_AMBULATORY_CARE_PROVIDER_SITE_OTHER): Payer: Medicare HMO

## 2017-09-08 DIAGNOSIS — N950 Postmenopausal bleeding: Secondary | ICD-10-CM

## 2017-09-09 ENCOUNTER — Encounter: Payer: Self-pay | Admitting: Obstetrics and Gynecology

## 2017-09-09 ENCOUNTER — Ambulatory Visit (INDEPENDENT_AMBULATORY_CARE_PROVIDER_SITE_OTHER): Payer: Medicare HMO | Admitting: Obstetrics and Gynecology

## 2017-09-09 VITALS — BP 106/68 | HR 91 | Wt 124.1 lb

## 2017-09-09 DIAGNOSIS — N840 Polyp of corpus uteri: Secondary | ICD-10-CM | POA: Diagnosis not present

## 2017-09-09 DIAGNOSIS — N950 Postmenopausal bleeding: Secondary | ICD-10-CM

## 2017-09-09 NOTE — Addendum Note (Signed)
Addended by: Raliegh Ip on: 09/09/2017 11:28 AM   Modules accepted: Orders

## 2017-09-09 NOTE — Progress Notes (Signed)
HPI:      [name] is a 51 y.o. [phone] who LMP was No LMP recorded (lmp unknown). Patient is postmenopausal.  Subjective:   She presents today for a follow-up of her postmenopausal bleeding.  Her bleeding has now stopped with the use of Aygestin.  She had an ultrasound revealing a slightly thickened endometrial lining (43mm) She does complain of continued pelvic discomfort.    Hx: The following portions of the patient's history were reviewed and updated as appropriate:             She  has a past medical history of Abnormal uterine bleeding, Allergy, Anxiety, Bipolar disorder ([place]), DDD (degenerative disc disease), Depression, HSV infection, UTI (urinary tract infection), Pertussis, Seizures ([place]), and Thyroid disease. She does not have any pertinent problems on file. She  has a past surgical history that includes Cervical spine surgery; Cesarean section (2003); Tubal ligation; Tonsillectomy; and Wisdom tooth extraction. Her family history includes Cancer in her maternal grandmother and mother. She  reports that  has never smoked. she has never used smokeless tobacco. She reports that she does not drink alcohol or use drugs. She is allergic to naproxen.       Review of Systems:  Review of Systems  Constitutional: Denied constitutional symptoms, night sweats, recent illness, fatigue, fever, insomnia and weight loss.  Eyes: Denied eye symptoms, eye pain, photophobia, vision change and visual disturbance.  Ears/Nose/Throat/Neck: Denied ear, nose, throat or neck symptoms, hearing loss, nasal discharge, sinus congestion and sore throat.  Cardiovascular: Denied cardiovascular symptoms, arrhythmia, chest pain/pressure, edema, exercise intolerance, orthopnea and palpitations.  Respiratory: Denied pulmonary symptoms, asthma, pleuritic pain, productive sputum, cough, dyspnea and wheezing.  Gastrointestinal: Denied, gastro-esophageal reflux, melena, nausea and vomiting.  Genitourinary:  See HPI for additional information.  Musculoskeletal: Denied musculoskeletal symptoms, stiffness, swelling, muscle weakness and myalgia.  Dermatologic: Denied dermatology symptoms, rash and scar.  Neurologic: Denied neurology symptoms, dizziness, headache, neck pain and syncope.  Psychiatric: Denied psychiatric symptoms, anxiety and depression.  Endocrine: Denied endocrine symptoms including hot flashes and night sweats.   Meds:   Current Outpatient Medications on File Prior to Visit  Medication Sig Dispense Refill  . acetaminophen (TYLENOL) 500 MG tablet Take 500-1,000 mg by mouth every 6 (six) hours as needed for mild pain.     Marland Kitchen acyclovir (ZOVIRAX) 400 MG tablet TK 1 T PO BID  9  . amphetamine-dextroamphetamine (ADDERALL XR) 20 MG 24 hr capsule TK 1 C PO QAM  0  . aspirin EC 81 MG EC tablet Take 1 tablet (81 mg total) by mouth daily. 30 tablet 0  . bisacodyl (DULCOLAX) 5 MG EC tablet Take 10 mg by mouth at bedtime as needed for moderate constipation (constipation).    . cholecalciferol (VITAMIN D) 1000 units tablet Take 1,000 Units by mouth daily.    . clonazePAM (KLONOPIN) 1 MG tablet TK 1 T PO QD PRA  2  . clotrimazole (GYNE-LOTRIMIN) 1 % vaginal cream IVB FOR 7 DAYS  0  . cyclobenzaprine (FLEXERIL) 5 MG tablet Take 5 mg by mouth 3 (three) times daily.  2  . EPINEPHrine (EPIPEN 2-PAK) 0.3 mg/0.3 mL IJ SOAJ injection Inject 0.3 mg into the muscle once as needed (severe allergic reaction).    . hydrOXYzine (ATARAX/VISTARIL) 25 MG tablet     . lamoTRIgine (LAMICTAL) 100 MG tablet Take 100 mg by mouth daily.     Marland Kitchen LATUDA 80 MG TABS tablet TK 1 T PO D AFTER  DINNER  1  . lurasidone (LATUDA) 40 MG TABS tablet Take 40 mg by mouth daily with breakfast.    . norethindrone (AYGESTIN) 5 MG tablet Take 1 tablet (5 mg total) 3 (three) times daily for 21 days by mouth. Until bleeding stops, then take 2 daily. 50 tablet 0  . Oxycodone HCl 10 MG TABS Take 10 mg by mouth every 6 (six) hours. scheduled   0  . RELISTOR 150 MG TABS TK 3 TS PO QD IN THE MORNING  2  . traZODone (DESYREL) 100 MG tablet Take 1 tablet (100 mg total) by mouth at bedtime as needed for sleep. 30 tablet 0  . valACYclovir (VALTREX) 500 MG tablet Take 500 mg by mouth 2 (two) times daily.     No current facility-administered medications on file prior to visit.     Objective:     Vitals:   09/09/17 1051  BP: 106/68  Pulse: 91              Physical examination   Pelvic:   Vulva: Normal appearance.  No lesions.  Vagina: No lesions or abnormalities noted.  Support: Normal pelvic support.  Urethra No masses tenderness or scarring.  Meatus Normal size without lesions or prolapse.  Cervix: Normal appearance.  No lesions.  Anus: Normal exam.  No lesions.  Perineum: Normal exam.  No lesions.        Bimanual   Uterus: Normal size.  Non-tender.  Mobile.  AV.  Adnexae: No masses.  Non-tender to palpation.  Cul-de-sac: Negative for abnormality.   Endometrial Biopsy After discussion with the patient regarding her abnormal uterine bleeding I recommended that she proceed with an endometrial biopsy for further diagnosis. The risks, benefits, alternatives, and indications for an endometrial biopsy were discussed with the patient in detail. She understood the risks including infection, bleeding, cervical laceration and uterine perforation.  Verbal consent was obtained.   PROCEDURE NOTE:  Vacurette endometrial biopsy was performed using aseptic technique with iodine preparation.  The uterus was sounded to a length of 10 cm.  Adequate sampling was obtained with minimal blood loss.  The patient tolerated the procedure well.  Disposition will be pending pathology   Assessment:    [id number] Patient Active Problem List   Diagnosis Date Noted  . Hypotension 09/12/2016  . Right facial numbness 09/11/2016  . Stroke-like symptom 09/11/2016  . Seizure ([place]) 09/11/2016  . Hypothyroidism 09/11/2016  . Stroke-like symptoms  09/11/2016  . Fall   . Syncope 11/20/2015  . Scalp laceration 11/20/2015  . Major depressive disorder, recurrent severe without psychotic features ([place]) 03/23/2015  . DDD (degenerative disc disease)   . ACUTE BRONCHITIS 10/22/2009  . GERD 08/02/2009  . DEGENERATIVE DISC DISEASE 08/02/2009  . COUGH 08/02/2009  . Bipolar disorder ([place]) 08/01/2009  . Alcoholism ([place]) 08/01/2009  . BULIMIA 08/01/2009  . HEARING LOSS, BILATERAL 08/01/2009  . OSTEOPENIA 08/01/2009  . SEIZURE DISORDER 08/01/2009     1. Postmenopausal bleeding        Plan:            1.  We will discuss management when endometrial biopsy results return.  Continue Aygestin until then.  Orders No orders of the defined types were placed in this encounter.   No orders of the defined types were placed in this encounter.     F/U  Return in about 1 week (around 09/16/2017).  Finis Bud, M.D. 09/09/2017 10:59 AM

## 2017-09-14 ENCOUNTER — Telehealth: Payer: Self-pay | Admitting: Obstetrics and Gynecology

## 2017-09-14 LAB — PATHOLOGY

## 2017-09-14 NOTE — Telephone Encounter (Signed)
Patient called and stated that she would like to get a prescription for her breakthrough bleeding before her appointment tomorrow 09/13/17. No other information was disclosed other than wanting to receive a call back. Please advise.

## 2017-09-15 ENCOUNTER — Ambulatory Visit (INDEPENDENT_AMBULATORY_CARE_PROVIDER_SITE_OTHER): Payer: Medicare HMO | Admitting: Obstetrics and Gynecology

## 2017-09-15 ENCOUNTER — Encounter: Payer: Self-pay | Admitting: Obstetrics and Gynecology

## 2017-09-15 ENCOUNTER — Telehealth: Payer: Self-pay | Admitting: Obstetrics and Gynecology

## 2017-09-15 NOTE — Telephone Encounter (Signed)
ider declines to give pain med at this time- suggested pt use ibuprofen. Pt has appt 09/22/17

## 2017-09-15 NOTE — Telephone Encounter (Signed)
The patient called to speak with Ivin Booty Dr. Rebekah [place]field nurse to see if she is able to get a pain prescription. The patient was scheduled today but had to reschedule due to Dr. Amalia Hailey having to go over to the hospital for surgery. No other information was disclosed other than wanting to receive a call back. Please advise.

## 2017-09-15 NOTE — Telephone Encounter (Signed)
oke with pt regarding message- this can be discussed at pts appointment- pt agreed.

## 2017-09-21 DIAGNOSIS — F3181 Bipolar II disorder: Secondary | ICD-10-CM | POA: Diagnosis not present

## 2017-09-21 DIAGNOSIS — F4310 Post-traumatic stress disorder, unspecified: Secondary | ICD-10-CM | POA: Diagnosis not present

## 2017-09-22 ENCOUNTER — Encounter: Payer: Self-pay | Admitting: Obstetrics and Gynecology

## 2017-09-22 ENCOUNTER — Ambulatory Visit (INDEPENDENT_AMBULATORY_CARE_PROVIDER_SITE_OTHER): Payer: Medicare HMO | Admitting: Obstetrics and Gynecology

## 2017-09-22 VITALS — BP 133/67 | HR 83 | Wt 120.4 lb

## 2017-09-22 DIAGNOSIS — N950 Postmenopausal bleeding: Secondary | ICD-10-CM | POA: Diagnosis not present

## 2017-09-22 DIAGNOSIS — N946 Dysmenorrhea, unspecified: Secondary | ICD-10-CM

## 2017-09-22 MED ORDER — TRAMADOL HCL 50 MG PO TABS: 50.0000 mg | ORAL_TABLET | Freq: Four times a day (QID) | ORAL | 0 refills | Status: DC | PRN

## 2017-09-22 NOTE — Progress Notes (Signed)
HPI:      [name] is a 51 y.o. [name] who LMP was Patient's last menstrual period was 09/22/2017 (exact date).  Subjective:   She presents today for follow-up of endometrial biopsy for postmenopausal bleeding and thickened endometrium.  Her biopsy revealed an endometrial polyp.  She is taking Aygestin and her bleeding had stopped but today she began having more vaginal bleeding.  This bleeding is associated with pelvic cramping and the patient is requesting pain medication. (Of significant note patient takes chronic Tylenol No. 3 for back pain but she has now "run out.")     Hx: The following portions of the patient's history were reviewed and updated as appropriate:             She  has a past medical history of Abnormal uterine bleeding, Allergy, Anxiety, Bipolar disorder ([place]), DDD (degenerative disc disease), Depression, HSV infection, UTI (urinary tract infection), Pertussis, Seizures ([place]), and Thyroid disease. She does not have any pertinent problems on file. She  has a past surgical history that includes Cervical spine surgery; Cesarean section (2003); Tubal ligation; Tonsillectomy; and Wisdom tooth extraction. Her family history includes Cancer in her maternal grandmother and mother. She  reports that  has never smoked. she has never used smokeless tobacco. She reports that she does not drink alcohol or use drugs. She is allergic to naproxen.       Review of Systems:  Review of Systems  Constitutional: Denied constitutional symptoms, night sweats, recent illness, fatigue, fever, insomnia and weight loss.  Eyes: Denied eye symptoms, eye pain, photophobia, vision change and visual disturbance.  Ears/Nose/Throat/Neck: Denied ear, nose, throat or neck symptoms, hearing loss, nasal discharge, sinus congestion and sore throat.  Cardiovascular: Denied cardiovascular symptoms, arrhythmia, chest pain/pressure, edema, exercise intolerance, orthopnea and palpitations.   Respiratory: Denied pulmonary symptoms, asthma, pleuritic pain, productive sputum, cough, dyspnea and wheezing.  Gastrointestinal: Denied, gastro-esophageal reflux, melena, nausea and vomiting.  Genitourinary: See HPI for additional information.  Musculoskeletal: Denied musculoskeletal symptoms, stiffness, swelling, muscle weakness and myalgia.  Dermatologic: Denied dermatology symptoms, rash and scar.  Neurologic: Denied neurology symptoms, dizziness, headache, neck pain and syncope.  Psychiatric: Denied psychiatric symptoms, anxiety and depression.  Endocrine: Denied endocrine symptoms including hot flashes and night sweats.   Meds:   Current Outpatient Medications on File Prior to Visit  Medication Sig Dispense Refill  . acetaminophen (TYLENOL) 500 MG tablet Take 500-1,000 mg by mouth every 6 (six) hours as needed for mild pain.     Marland Kitchen acyclovir (ZOVIRAX) 400 MG tablet TK 1 T PO BID  9  . amphetamine-dextroamphetamine (ADDERALL XR) 20 MG 24 hr capsule TK 1 C PO QAM  0  . aspirin EC 81 MG EC tablet Take 1 tablet (81 mg total) by mouth daily. 30 tablet 0  . cholecalciferol (VITAMIN D) 1000 units tablet Take 1,000 Units by mouth daily.    . clonazePAM (KLONOPIN) 1 MG tablet TK 1 T PO QD PRA  2  . cyclobenzaprine (FLEXERIL) 5 MG tablet Take 5 mg by mouth 3 (three) times daily.  2  . EPINEPHrine (EPIPEN 2-PAK) 0.3 mg/0.3 mL IJ SOAJ injection Inject 0.3 mg into the muscle once as needed (severe allergic reaction).    Marland Kitchen lamoTRIgine (LAMICTAL) 100 MG tablet Take 100 mg by mouth daily.     . norethindrone (AYGESTIN) 5 MG tablet Take 1 tablet (5 mg total) 3 (three) times daily for 21 days by mouth. Until bleeding stops, then take  2 daily. 50 tablet 0  . RELISTOR 150 MG TABS TK 3 TS PO QD IN THE MORNING  2  . traZODone (DESYREL) 100 MG tablet Take 1 tablet (100 mg total) by mouth at bedtime as needed for sleep. 30 tablet 0  . valACYclovir (VALTREX) 500 MG tablet Take 500 mg by mouth 2 (two) times  daily.     No current facility-administered medications on file prior to visit.     Objective:     Vitals:   09/22/17 1108  BP: 133/67  Pulse: 83                Assessment:    [name] Patient Active Problem List   Diagnosis Date Noted  . Hypotension 09/12/2016  . Right facial numbness 09/11/2016  . Stroke-like symptom 09/11/2016  . Seizure ([place]) 09/11/2016  . Hypothyroidism 09/11/2016  . Stroke-like symptoms 09/11/2016  . Fall   . Syncope 11/20/2015  . Scalp laceration 11/20/2015  . Major depressive disorder, recurrent severe without psychotic features ([place]) 03/23/2015  . DDD (degenerative disc disease)   . ACUTE BRONCHITIS 10/22/2009  . GERD 08/02/2009  . DEGENERATIVE DISC DISEASE 08/02/2009  . COUGH 08/02/2009  . Bipolar disorder ([place]) 08/01/2009  . Alcoholism ([place]) 08/01/2009  . BULIMIA 08/01/2009  . HEARING LOSS, BILATERAL 08/01/2009  . OSTEOPENIA 08/01/2009  . SEIZURE DISORDER 08/01/2009     1. Postmenopausal bleeding   2. Dysmenorrhea     Endometrial biopsy results reviewed with the patient.  Diagnosis of endometrial polyp discussed.  Medical and surgical management discussed.  Patient has a desire for surgical management.   Plan:            1.  D&C -hysteroscopy.  2.  Tramadol for short course pain management.  3.  Preop next week. Orders No orders of the defined types were placed in this encounter.    Meds ordered this encounter  Medications  . traMADol (ULTRAM) 50 MG tablet    Sig: Take 1 tablet (50 mg total) by mouth every 6 (six) hours as needed.    Dispense:  20 tablet    Refill:  0      F/U  Return in about 1 week (around 09/29/2017). I spent 17 minutes with this patient of which greater than 50% was spent discussing postmenopausal bleeding, endometrial polyps, differential diagnosis, management, surgical follow-up.  Finis Bud, M.D. 09/22/2017 11:36 AM

## 2017-09-29 ENCOUNTER — Ambulatory Visit (INDEPENDENT_AMBULATORY_CARE_PROVIDER_SITE_OTHER): Payer: Medicare HMO | Admitting: Obstetrics and Gynecology

## 2017-09-29 ENCOUNTER — Encounter: Payer: Self-pay | Admitting: Obstetrics and Gynecology

## 2017-09-29 VITALS — BP 105/62 | HR 89 | Ht 61.0 in | Wt 121.3 lb

## 2017-09-29 DIAGNOSIS — N840 Polyp of corpus uteri: Secondary | ICD-10-CM

## 2017-09-29 DIAGNOSIS — N950 Postmenopausal bleeding: Secondary | ICD-10-CM | POA: Diagnosis not present

## 2017-09-29 MED ORDER — NORETHINDRONE ACETATE 5 MG PO TABS: 5.0000 mg | ORAL_TABLET | Freq: Three times a day (TID) | ORAL | 0 refills | Status: DC

## 2017-09-29 NOTE — H&P (View-Only) (Signed)
PRE-OPERATIVE HISTORY AND PHYSICAL EXAM  PCP:  Sandi Mariscal, MD Subjective:   HPI:  [name] is a 51 y.o. [phone].  Patient's last menstrual period was 09/22/2017 (exact date).  She presents today for a pre-op discussion and PE.  She has the following symptoms: Vaginal bleeding and an endometrial biopsy revealing  endometrial polyp.  Review of Systems:   Constitutional: Denied constitutional symptoms, night sweats, recent illness, fatigue, fever, insomnia and weight loss.  Eyes: Denied eye symptoms, eye pain, photophobia, vision change and visual disturbance.  Ears/Nose/Throat/Neck: Denied ear, nose, throat or neck symptoms, hearing loss, nasal discharge, sinus congestion and sore throat.  Cardiovascular: Denied cardiovascular symptoms, arrhythmia, chest pain/pressure, edema, exercise intolerance, orthopnea and palpitations.  Respiratory: Denied pulmonary symptoms, asthma, pleuritic pain, productive sputum, cough, dyspnea and wheezing.  Gastrointestinal: Denied, gastro-esophageal reflux, melena, nausea and vomiting.  Genitourinary: Denied genitourinary symptoms including symptomatic vaginal discharge, pelvic relaxation issues, and urinary complaints.  Musculoskeletal: Denied musculoskeletal symptoms, stiffness, swelling, muscle weakness and myalgia.  Dermatologic: Denied dermatology symptoms, rash and scar.  Neurologic: Denied neurology symptoms, dizziness, headache, neck pain and syncope.  Psychiatric: Denied psychiatric symptoms, anxiety and depression.  Endocrine: Denied endocrine symptoms including hot flashes and night sweats.   OB History  Gravida Para Term Preterm AB Living  5 4 3 1 1 4   SAB TAB Ectopic Multiple Live Births          4    # Outcome Date GA Lbr Len/2nd Weight Sex Delivery Anes PTL Lv  5 Preterm 2003    M CS-Unspec   LIV  4 Term 1998    F Vag-Spont  N LIV  3 Term 1990    M Vag-Spont  N LIV  2 Term 1988    F Vag-Spont  N LIV  1 AB 1986                Past Medical History:  Diagnosis Date  . Abnormal uterine bleeding   . Allergy   . Anxiety   . Bipolar disorder ([place])   . DDD (degenerative disc disease)   . Depression   . HSV infection   . Hx: UTI (urinary tract infection)   . Pertussis   . Seizures ([place])    last seizure 12/2014   . Thyroid disease     Past Surgical History:  Procedure Laterality Date  . CERVICAL SPINE SURGERY     x 3 2001,2005,2007 c5-6  . CESAREAN SECTION  2003  . TONSILLECTOMY    . TUBAL LIGATION    . WISDOM TOOTH EXTRACTION        SOCIAL HISTORY: Social History   Tobacco Use  Smoking Status Never Smoker  Smokeless Tobacco Never Used   Social History   Substance and Sexual Activity  Alcohol Use No  . Alcohol/week: 0.0 oz   Comment: quit drinking 7 months   Social History   Substance and Sexual Activity  Drug Use No   Comment: last used in 1990's    Family History  Problem Relation Age of Onset  . Cancer Mother   . Cancer Maternal Grandmother   . Diabetes Mellitus II Neg Hx   . CAD Neg Hx   . Colon cancer Neg Hx   . Colon polyps Neg Hx   . Heart disease Neg Hx   . Rectal cancer Neg Hx   . Stomach cancer Neg Hx     ALLERGIES:  Naproxen  MEDS:  Current Outpatient Medications on File Prior to Visit  Medication Sig Dispense Refill  . acetaminophen (TYLENOL) 500 MG tablet Take 500-1,000 mg by mouth every 6 (six) hours as needed for mild pain.     Marland Kitchen acyclovir (ZOVIRAX) 400 MG tablet TK 1 T PO BID  9  . amphetamine-dextroamphetamine (ADDERALL XR) 20 MG 24 hr capsule TK 1 C PO QAM  0  . aspirin EC 81 MG EC tablet Take 1 tablet (81 mg total) by mouth daily. 30 tablet 0  . cholecalciferol (VITAMIN D) 1000 units tablet Take 1,000 Units by mouth daily.    . clonazePAM (KLONOPIN) 1 MG tablet TK 1 T PO QD PRA  2  . cyclobenzaprine (FLEXERIL) 5 MG tablet Take 5 mg by mouth 3 (three) times daily.  2  . EPINEPHrine (EPIPEN 2-PAK) 0.3 mg/0.3 mL IJ SOAJ injection Inject 0.3 mg into  the muscle once as needed (severe allergic reaction).    Marland Kitchen lamoTRIgine (LAMICTAL) 100 MG tablet Take 100 mg by mouth daily.     . Oxycodone HCl 10 MG TABS     . RELISTOR 150 MG TABS TK 3 TS PO QD IN THE MORNING  2  . traMADol (ULTRAM) 50 MG tablet Take 1 tablet (50 mg total) by mouth every 6 (six) hours as needed. 20 tablet 0  . traZODone (DESYREL) 100 MG tablet Take 1 tablet (100 mg total) by mouth at bedtime as needed for sleep. 30 tablet 0  . valACYclovir (VALTREX) 500 MG tablet Take 500 mg by mouth 2 (two) times daily.     No current facility-administered medications on file prior to visit.     Meds ordered this encounter  Medications  . norethindrone (AYGESTIN) 5 MG tablet    Sig: Take 1 tablet (5 mg total) by mouth 3 (three) times daily for 20 days. As directed    Dispense:  60 tablet    Refill:  0     Physical examination BP 105/62   Pulse 89   Ht 5\' 1"  (1.549 m)   Wt 121 lb 5 oz (55 kg)   LMP 09/22/2017 (Exact Date)   BMI 22.92 kg/m   General NAD, Conversant  HEENT Atraumatic; Op clear with mmm.  Normo-cephalic. Pupils reactive. Anicteric sclerae  Thyroid/Neck Smooth without nodularity or enlargement. Normal ROM.  Neck Supple.  Skin No rashes, lesions or ulceration. Normal palpated skin turgor. No nodularity.  Breasts: No masses or discharge.  Symmetric.  No axillary adenopathy.  Lungs: Clear to auscultation.No rales or wheezes. Normal Respiratory effort, no retractions.  Heart: NSR.  No murmurs or rubs appreciated. No periferal edema  Abdomen: Soft.  Non-tender.  No masses.  No HSM. No hernia  Extremities: Moves all appropriately.  Normal ROM for age. No lymphadenopathy.  Neuro: Oriented to PPT.  Normal mood. Normal affect.     Pelvic:   Vulva: Normal appearance.  No lesions.  Vagina: No lesions or abnormalities noted.  Support: Normal pelvic support.  Urethra No masses tenderness or scarring.  Meatus Normal size without lesions or prolapse.  Cervix: Normal  ectropion.  No lesions.  Anus: Normal exam.  No lesions.  Perineum: Normal exam.  No lesions.        Bimanual   Uterus: Normal size.  Non-tender.  Mobile.  AV.  Adnexae: No masses.  Non-tender to palpation.  Cul-de-sac: Negative for abnormality.   Assessment:   [id number] Patient Active Problem List   Diagnosis Date Noted  . Hypotension 09/12/2016  .  Right facial numbness 09/11/2016  . Stroke-like symptom 09/11/2016  . Seizure ([place]) 09/11/2016  . Hypothyroidism 09/11/2016  . Stroke-like symptoms 09/11/2016  . Fall   . Syncope 11/20/2015  . Scalp laceration 11/20/2015  . 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 DDD (degenerative disc disease)   . ACUTE BRONCHITIS 10/22/2009  . GERD 08/02/2009  . DEGENERATIVE DISC DISEASE 08/02/2009  . COUGH 08/02/2009  . Bipolar disorder ([place]) 08/01/2009  . Alcoholism ([place]) 08/01/2009  . BULIMIA 08/01/2009  . HEARING LOSS, BILATERAL 08/01/2009  . OSTEOPENIA 08/01/2009  . SEIZURE DISORDER 08/01/2009    1. Postmenopausal bleeding   2. Endometrial polyp      Plan:   Orders: Meds ordered this encounter  Medications  . norethindrone (AYGESTIN) 5 MG tablet    Sig: Take 1 tablet (5 mg total) by mouth 3 (three) times daily for 20 days. As directed    Dispense:  60 tablet    Refill:  0     1.  Hysteroscopy D&C  The risks and benefits of surgery were discussed in detail.  Uterine perforation, bleeding infection and anesthesia risk were reviewed.  I have answered all the patient's questions and I believe she has an adequate and informed understanding of dilation and curettage with hysteroscopy.  Finis Bud, M.D. 09/29/2017 11:04 AM

## 2017-09-29 NOTE — Progress Notes (Signed)
PRE-OPERATIVE HISTORY AND PHYSICAL EXAM  PCP:  Sandi Mariscal, MD Subjective:   HPI:  [name] is a 51 y.o. [phone].  Patient's last menstrual period was 09/22/2017 (exact date).  She presents today for a pre-op discussion and PE.  She has the following symptoms: Vaginal bleeding and an endometrial biopsy revealing  endometrial polyp.  Review of Systems:   Constitutional: Denied constitutional symptoms, night sweats, recent illness, fatigue, fever, insomnia and weight loss.  Eyes: Denied eye symptoms, eye pain, photophobia, vision change and visual disturbance.  Ears/Nose/Throat/Neck: Denied ear, nose, throat or neck symptoms, hearing loss, nasal discharge, sinus congestion and sore throat.  Cardiovascular: Denied cardiovascular symptoms, arrhythmia, chest pain/pressure, edema, exercise intolerance, orthopnea and palpitations.  Respiratory: Denied pulmonary symptoms, asthma, pleuritic pain, productive sputum, cough, dyspnea and wheezing.  Gastrointestinal: Denied, gastro-esophageal reflux, melena, nausea and vomiting.  Genitourinary: Denied genitourinary symptoms including symptomatic vaginal discharge, pelvic relaxation issues, and urinary complaints.  Musculoskeletal: Denied musculoskeletal symptoms, stiffness, swelling, muscle weakness and myalgia.  Dermatologic: Denied dermatology symptoms, rash and scar.  Neurologic: Denied neurology symptoms, dizziness, headache, neck pain and syncope.  Psychiatric: Denied psychiatric symptoms, anxiety and depression.  Endocrine: Denied endocrine symptoms including hot flashes and night sweats.   OB History  Gravida Para Term Preterm AB Living  5 4 3 1 1 4   SAB TAB Ectopic Multiple Live Births          4    # Outcome Date GA Lbr Len/2nd Weight Sex Delivery Anes PTL Lv  5 Preterm 2003    M CS-Unspec   LIV  4 Term 1998    F Vag-Spont  N LIV  3 Term 1990    M Vag-Spont  N LIV  2 Term 1988    F Vag-Spont  N LIV  1 AB 1986              Past Medical History:  Diagnosis Date  . Abnormal uterine bleeding   . Allergy   . Anxiety   . Bipolar disorder ([place])   . DDD (degenerative disc disease)   . Depression   . HSV infection   . Hx: UTI (urinary tract infection)   . Pertussis   . Seizures ([place])    last seizure 12/2014   . Thyroid disease     Past Surgical History:  Procedure Laterality Date  . CERVICAL SPINE SURGERY     x 3 2001,2005,2007 c5-6  . CESAREAN SECTION  2003  . TONSILLECTOMY    . TUBAL LIGATION    . WISDOM TOOTH EXTRACTION        SOCIAL HISTORY: Social History   Tobacco Use  Smoking Status Never Smoker  Smokeless Tobacco Never Used   Social History   Substance and Sexual Activity  Alcohol Use No  . Alcohol/week: 0.0 oz   Comment: quit drinking 7 months   Social History   Substance and Sexual Activity  Drug Use No   Comment: last used in 1990's    Family History  Problem Relation Age of Onset  . Cancer Mother   . Cancer Maternal Grandmother   . Diabetes Mellitus II Neg Hx   . CAD Neg Hx   . Colon cancer Neg Hx   . Colon polyps Neg Hx   . Heart disease Neg Hx   . Rectal cancer Neg Hx   . Stomach cancer Neg Hx     ALLERGIES:  Naproxen  MEDS:   Current  Outpatient Medications on File Prior to Visit  Medication Sig Dispense Refill  . acetaminophen (TYLENOL) 500 MG tablet Take 500-1,000 mg by mouth every 6 (six) hours as needed for mild pain.     Marland Kitchen acyclovir (ZOVIRAX) 400 MG tablet TK 1 T PO BID  9  . amphetamine-dextroamphetamine (ADDERALL XR) 20 MG 24 hr capsule TK 1 C PO QAM  0  . aspirin EC 81 MG EC tablet Take 1 tablet (81 mg total) by mouth daily. 30 tablet 0  . cholecalciferol (VITAMIN D) 1000 units tablet Take 1,000 Units by mouth daily.    . clonazePAM (KLONOPIN) 1 MG tablet TK 1 T PO QD PRA  2  . cyclobenzaprine (FLEXERIL) 5 MG tablet Take 5 mg by mouth 3 (three) times daily.  2  . EPINEPHrine (EPIPEN 2-PAK) 0.3 mg/0.3 mL IJ SOAJ injection Inject 0.3 mg into the  muscle once as needed (severe allergic reaction).    Marland Kitchen lamoTRIgine (LAMICTAL) 100 MG tablet Take 100 mg by mouth daily.     . Oxycodone HCl 10 MG TABS     . RELISTOR 150 MG TABS TK 3 TS PO QD IN THE MORNING  2  . traMADol (ULTRAM) 50 MG tablet Take 1 tablet (50 mg total) by mouth every 6 (six) hours as needed. 20 tablet 0  . traZODone (DESYREL) 100 MG tablet Take 1 tablet (100 mg total) by mouth at bedtime as needed for sleep. 30 tablet 0  . valACYclovir (VALTREX) 500 MG tablet Take 500 mg by mouth 2 (two) times daily.     No current facility-administered medications on file prior to visit.     Meds ordered this encounter  Medications  . norethindrone (AYGESTIN) 5 MG tablet    Sig: Take 1 tablet (5 mg total) by mouth 3 (three) times daily for 20 days. As directed    Dispense:  60 tablet    Refill:  0     Physical examination BP 105/62   Pulse 89   Ht 5\' 1"  (1.549 m)   Wt 121 lb 5 oz (55 kg)   LMP 09/22/2017 (Exact Date)   BMI 22.92 kg/m   General NAD, Conversant  HEENT Atraumatic; Op clear with mmm.  Normo-cephalic. Pupils reactive. Anicteric sclerae  Thyroid/Neck Smooth without nodularity or enlargement. Normal ROM.  Neck Supple.  Skin No rashes, lesions or ulceration. Normal palpated skin turgor. No nodularity.  Breasts: No masses or discharge.  Symmetric.  No axillary adenopathy.  Lungs: Clear to auscultation.No rales or wheezes. Normal Respiratory effort, no retractions.  Heart: NSR.  No murmurs or rubs appreciated. No periferal edema  Abdomen: Soft.  Non-tender.  No masses.  No HSM. No hernia  Extremities: Moves all appropriately.  Normal ROM for age. No lymphadenopathy.  Neuro: Oriented to PPT.  Normal mood. Normal affect.     Pelvic:   Vulva: Normal appearance.  No lesions.  Vagina: No lesions or abnormalities noted.  Support: Normal pelvic support.  Urethra No masses tenderness or scarring.  Meatus Normal size without lesions or prolapse.  Cervix: Normal  ectropion.  No lesions.  Anus: Normal exam.  No lesions.  Perineum: Normal exam.  No lesions.        Bimanual   Uterus: Normal size.  Non-tender.  Mobile.  AV.  Adnexae: No masses.  Non-tender to palpation.  Cul-de-sac: Negative for abnormality.   Assessment:   [id number] Patient Active Problem List   Diagnosis Date Noted  . Hypotension 09/12/2016  .  Right facial numbness 09/11/2016  . Stroke-like symptom 09/11/2016  . Seizure ([place]) 09/11/2016  . Hypothyroidism 09/11/2016  . Stroke-like symptoms 09/11/2016  . Fall   . Syncope 11/20/2015  . Scalp laceration 11/20/2015  . Major depressive disorder, recurrent severe without psychotic features ([place]) 03/23/2015  . DDD (degenerative disc disease)   . ACUTE BRONCHITIS 10/22/2009  . GERD 08/02/2009  . DEGENERATIVE DISC DISEASE 08/02/2009  . COUGH 08/02/2009  . Bipolar disorder ([place]) 08/01/2009  . Alcoholism ([place]) 08/01/2009  . BULIMIA 08/01/2009  . HEARING LOSS, BILATERAL 08/01/2009  . OSTEOPENIA 08/01/2009  . SEIZURE DISORDER 08/01/2009    1. Postmenopausal bleeding   2. Endometrial polyp      Plan:   Orders: Meds ordered this encounter  Medications  . norethindrone (AYGESTIN) 5 MG tablet    Sig: Take 1 tablet (5 mg total) by mouth 3 (three) times daily for 20 days. As directed    Dispense:  60 tablet    Refill:  0     1.  Hysteroscopy D&C  The risks and benefits of surgery were discussed in detail.  Uterine perforation, bleeding infection and anesthesia risk were reviewed.  I have answered all the patient's questions and I believe she has an adequate and informed understanding of dilation and curettage with hysteroscopy.  Finis Bud, M.D. 09/29/2017 11:04 AM

## 2017-09-29 NOTE — H&P (Signed)
PRE-OPERATIVE HISTORY AND PHYSICAL EXAM  PCP:  Sandi Mariscal, MD Subjective:   HPI:  [name] is a 51 y.o. [phone].  Patient's last menstrual period was 09/22/2017 (exact date).  She presents today for a pre-op discussion and PE.  She has the following symptoms: Vaginal bleeding and an endometrial biopsy revealing  endometrial polyp.  Review of Systems:   Constitutional: Denied constitutional symptoms, night sweats, recent illness, fatigue, fever, insomnia and weight loss.  Eyes: Denied eye symptoms, eye pain, photophobia, vision change and visual disturbance.  Ears/Nose/Throat/Neck: Denied ear, nose, throat or neck symptoms, hearing loss, nasal discharge, sinus congestion and sore throat.  Cardiovascular: Denied cardiovascular symptoms, arrhythmia, chest pain/pressure, edema, exercise intolerance, orthopnea and palpitations.  Respiratory: Denied pulmonary symptoms, asthma, pleuritic pain, productive sputum, cough, dyspnea and wheezing.  Gastrointestinal: Denied, gastro-esophageal reflux, melena, nausea and vomiting.  Genitourinary: Denied genitourinary symptoms including symptomatic vaginal discharge, pelvic relaxation issues, and urinary complaints.  Musculoskeletal: Denied musculoskeletal symptoms, stiffness, swelling, muscle weakness and myalgia.  Dermatologic: Denied dermatology symptoms, rash and scar.  Neurologic: Denied neurology symptoms, dizziness, headache, neck pain and syncope.  Psychiatric: Denied psychiatric symptoms, anxiety and depression.  Endocrine: Denied endocrine symptoms including hot flashes and night sweats.   OB History  Gravida Para Term Preterm AB Living  5 4 3 1 1 4   SAB TAB Ectopic Multiple Live Births          4    # Outcome Date GA Lbr Len/2nd Weight Sex Delivery Anes PTL Lv  5 Preterm 2003    M CS-Unspec   LIV  4 Term 1998    F Vag-Spont  N LIV  3 Term 1990    M Vag-Spont  N LIV  2 Term 1988    F Vag-Spont  N LIV  1 AB 1986                Past Medical History:  Diagnosis Date  . Abnormal uterine bleeding   . Allergy   . Anxiety   . Bipolar disorder ([place])   . DDD (degenerative disc disease)   . Depression   . HSV infection   . Hx: UTI (urinary tract infection)   . Pertussis   . Seizures ([place])    last seizure 12/2014   . Thyroid disease     Past Surgical History:  Procedure Laterality Date  . CERVICAL SPINE SURGERY     x 3 2001,2005,2007 c5-6  . CESAREAN SECTION  2003  . TONSILLECTOMY    . TUBAL LIGATION    . WISDOM TOOTH EXTRACTION        SOCIAL HISTORY: Social History   Tobacco Use  Smoking Status Never Smoker  Smokeless Tobacco Never Used   Social History   Substance and Sexual Activity  Alcohol Use No  . Alcohol/week: 0.0 oz   Comment: quit drinking 7 months   Social History   Substance and Sexual Activity  Drug Use No   Comment: last used in 1990's    Family History  Problem Relation Age of Onset  . Cancer Mother   . Cancer Maternal Grandmother   . Diabetes Mellitus II Neg Hx   . CAD Neg Hx   . Colon cancer Neg Hx   . Colon polyps Neg Hx   . Heart disease Neg Hx   . Rectal cancer Neg Hx   . Stomach cancer Neg Hx     ALLERGIES:  Naproxen  MEDS:  Current Outpatient Medications on File Prior to Visit  Medication Sig Dispense Refill  . acetaminophen (TYLENOL) 500 MG tablet Take 500-1,000 mg by mouth every 6 (six) hours as needed for mild pain.     Marland Kitchen acyclovir (ZOVIRAX) 400 MG tablet TK 1 T PO BID  9  . amphetamine-dextroamphetamine (ADDERALL XR) 20 MG 24 hr capsule TK 1 C PO QAM  0  . aspirin EC 81 MG EC tablet Take 1 tablet (81 mg total) by mouth daily. 30 tablet 0  . cholecalciferol (VITAMIN D) 1000 units tablet Take 1,000 Units by mouth daily.    . clonazePAM (KLONOPIN) 1 MG tablet TK 1 T PO QD PRA  2  . cyclobenzaprine (FLEXERIL) 5 MG tablet Take 5 mg by mouth 3 (three) times daily.  2  . EPINEPHrine (EPIPEN 2-PAK) 0.3 mg/0.3 mL IJ SOAJ injection Inject 0.3 mg into  the muscle once as needed (severe allergic reaction).    Marland Kitchen lamoTRIgine (LAMICTAL) 100 MG tablet Take 100 mg by mouth daily.     . Oxycodone HCl 10 MG TABS     . RELISTOR 150 MG TABS TK 3 TS PO QD IN THE MORNING  2  . traMADol (ULTRAM) 50 MG tablet Take 1 tablet (50 mg total) by mouth every 6 (six) hours as needed. 20 tablet 0  . traZODone (DESYREL) 100 MG tablet Take 1 tablet (100 mg total) by mouth at bedtime as needed for sleep. 30 tablet 0  . valACYclovir (VALTREX) 500 MG tablet Take 500 mg by mouth 2 (two) times daily.     No current facility-administered medications on file prior to visit.     Meds ordered this encounter  Medications  . norethindrone (AYGESTIN) 5 MG tablet    Sig: Take 1 tablet (5 mg total) by mouth 3 (three) times daily for 20 days. As directed    Dispense:  60 tablet    Refill:  0     Physical examination BP 105/62   Pulse 89   Ht 5\' 1"  (1.549 m)   Wt 121 lb 5 oz (55 kg)   LMP 09/22/2017 (Exact Date)   BMI 22.92 kg/m   General NAD, Conversant  HEENT Atraumatic; Op clear with mmm.  Normo-cephalic. Pupils reactive. Anicteric sclerae  Thyroid/Neck Smooth without nodularity or enlargement. Normal ROM.  Neck Supple.  Skin No rashes, lesions or ulceration. Normal palpated skin turgor. No nodularity.  Breasts: No masses or discharge.  Symmetric.  No axillary adenopathy.  Lungs: Clear to auscultation.No rales or wheezes. Normal Respiratory effort, no retractions.  Heart: NSR.  No murmurs or rubs appreciated. No periferal edema  Abdomen: Soft.  Non-tender.  No masses.  No HSM. No hernia  Extremities: Moves all appropriately.  Normal ROM for age. No lymphadenopathy.  Neuro: Oriented to PPT.  Normal mood. Normal affect.     Pelvic:   Vulva: Normal appearance.  No lesions.  Vagina: No lesions or abnormalities noted.  Support: Normal pelvic support.  Urethra No masses tenderness or scarring.  Meatus Normal size without lesions or prolapse.  Cervix: Normal  ectropion.  No lesions.  Anus: Normal exam.  No lesions.  Perineum: Normal exam.  No lesions.        Bimanual   Uterus: Normal size.  Non-tender.  Mobile.  AV.  Adnexae: No masses.  Non-tender to palpation.  Cul-de-sac: Negative for abnormality.   Assessment:   [id number] Patient Active Problem List   Diagnosis Date Noted  . Hypotension 09/12/2016  .  Right facial numbness 09/11/2016  . Stroke-like symptom 09/11/2016  . Seizure ([place]) 09/11/2016  . Hypothyroidism 09/11/2016  . Stroke-like symptoms 09/11/2016  . Fall   . Syncope 11/20/2015  . Scalp laceration 11/20/2015  . 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 DDD (degenerative disc disease)   . ACUTE BRONCHITIS 10/22/2009  . GERD 08/02/2009  . DEGENERATIVE DISC DISEASE 08/02/2009  . COUGH 08/02/2009  . Bipolar disorder ([place]) 08/01/2009  . Alcoholism ([place]) 08/01/2009  . BULIMIA 08/01/2009  . HEARING LOSS, BILATERAL 08/01/2009  . OSTEOPENIA 08/01/2009  . SEIZURE DISORDER 08/01/2009    1. Postmenopausal bleeding   2. Endometrial polyp      Plan:   Orders: Meds ordered this encounter  Medications  . norethindrone (AYGESTIN) 5 MG tablet    Sig: Take 1 tablet (5 mg total) by mouth 3 (three) times daily for 20 days. As directed    Dispense:  60 tablet    Refill:  0     1.  Hysteroscopy D&C  The risks and benefits of surgery were discussed in detail.  Uterine perforation, bleeding infection and anesthesia risk were reviewed.  I have answered all the patient's questions and I believe she has an adequate and informed understanding of dilation and curettage with hysteroscopy.  Finis Bud, M.D. 09/29/2017 11:04 AM

## 2017-10-06 DIAGNOSIS — M47816 Spondylosis without myelopathy or radiculopathy, lumbar region: Secondary | ICD-10-CM | POA: Diagnosis not present

## 2017-10-06 DIAGNOSIS — M5416 Radiculopathy, lumbar region: Secondary | ICD-10-CM | POA: Diagnosis not present

## 2017-10-06 DIAGNOSIS — M4722 Other spondylosis with radiculopathy, cervical region: Secondary | ICD-10-CM | POA: Diagnosis not present

## 2017-10-06 DIAGNOSIS — M5412 Radiculopathy, cervical region: Secondary | ICD-10-CM | POA: Diagnosis not present

## 2017-10-07 ENCOUNTER — Encounter
Admission: RE | Admit: 2017-10-07 | Discharge: 2017-10-07 | Disposition: A | Discharge status: Home / Self Care | Payer: Medicare HMO | Source: Ambulatory Visit | Attending: Obstetrics and Gynecology | Admitting: Obstetrics and Gynecology

## 2017-10-07 ENCOUNTER — Other Ambulatory Visit: Payer: Self-pay

## 2017-10-07 DIAGNOSIS — F419 Anxiety disorder, unspecified: Secondary | ICD-10-CM | POA: Diagnosis not present

## 2017-10-07 DIAGNOSIS — F329 Major depressive disorder, single episode, unspecified: Secondary | ICD-10-CM | POA: Insufficient documentation

## 2017-10-07 DIAGNOSIS — Z01812 Encounter for preprocedural laboratory examination: Secondary | ICD-10-CM | POA: Diagnosis not present

## 2017-10-07 DIAGNOSIS — R569 Unspecified convulsions: Secondary | ICD-10-CM | POA: Insufficient documentation

## 2017-10-07 DIAGNOSIS — E039 Hypothyroidism, unspecified: Secondary | ICD-10-CM | POA: Insufficient documentation

## 2017-10-07 DIAGNOSIS — K219 Gastro-esophageal reflux disease without esophagitis: Secondary | ICD-10-CM | POA: Insufficient documentation

## 2017-10-07 LAB — CBC
HCT: 40.2 % (ref 35.0–47.0)
Hemoglobin: 13.4 g/dL (ref 12.0–16.0)
MCH: 32.1 pg (ref 26.0–34.0)
MCHC: 33.5 g/dL (ref 32.0–36.0)
MCV: 96.1 fL (ref 80.0–100.0)
PLATELETS: 239 10*3/uL (ref 150–440)
RBC: 4.18 MIL/uL (ref 3.80–5.20)
RDW: 13.3 % (ref 11.5–14.5)
WBC: 4.6 10*3/uL (ref 3.6–11.0)

## 2017-10-07 LAB — TYPE AND SCREEN
ABO/RH(D): A POS
ANTIBODY SCREEN: NEGATIVE

## 2017-10-07 LAB — HCG, QUANTITATIVE, PREGNANCY

## 2017-10-07 NOTE — Patient Instructions (Signed)
Your procedure is scheduled on: Monday 10/11/17 Report to [facility]. 2ND FLOOR MEDICAL MALL ENTRANCE. To find out your arrival time please call [phone] between 1PM - 3PM on Friday 10/08/17.  Remember: Instructions that are not followed completely may result in serious medical risk, up to and including death, or upon the discretion of your surgeon and anesthesiologist your surgery may need to be rescheduled.    __X__ 1. Do not eat anything after midnight the night before your    procedure.  No gum chewing or hard candies.  You may drink clear   liquids up to 2 hours before you are scheduled to arrive at the   hospital for your procedure. Do not drink clear liquids within 2   hours of scheduled arrival to the hospital as this may lead to your   procedure being delayed or rescheduled.       Clear liquids include:   Water or Apple juice without pulp   Clear carbohydrate beverage such as Clearfast or Gatorade   Black coffee or Clear Tea (no milk, no creamer, do not add anything   to the coffee or tea)    Diabetics should only drink water   __X__ 2. No Alcohol for 24 hours before or after surgery.   ____ 3. Bring all medications with you on the day of surgery if instructed.    __X__ 4. Notify your doctor if there is any change in your medical condition     (cold, fever, infections).             __X___5. No smoking within 24 hours of your surgery.     Do not wear jewelry, make-up, hairpins, clips or nail polish.  Do not wear lotions, powders, or perfumes.   Do not shave 48 hours prior to surgery. Men may shave face and neck.  Do not bring valuables to the hospital.    [facility] is not responsible for any belongings or valuables.               Contacts, dentures or bridgework may not be worn into surgery.  Leave your suitcase in the car. After surgery it may be brought to your room.  For patients admitted to the hospital, discharge time is determined by your                 treatment team.   Patients discharged the day of surgery will not be allowed to drive home.   Please read over the following fact sheets that you were given:   MRSA Information   __X__ Take these medicines the morning of surgery with A SIP OF WATER:    1. LAMICTAL  2. ACYCLOVIR  3. VALACYCLOVIR  4. OXYCODONE IF NEEDED FOR PAIN  5.  6.  ____ Fleet Enema (as directed)   ____ Use CHG Soap/SAGE wipes as directed  ____ Use inhalers on the day of surgery  ____ Stop metformin 2 days prior to surgery    ____ Take 1/2 of usual insulin dose the night before surgery and none on the morning of surgery.   __X__ Stop Coumadin/Plavix/aspirin   __X__ Stop Anti-inflammatories such as Advil, Aleve, Ibuprofen, Motrin, Naproxen, Naprosyn, Goodies,powder, or aspirin products.  OK to take Tylenol.   __X__ Stop supplements, Vitamin E, Fish Oil until after surgery.    ____ Bring C-Pap to the hospital.

## 2017-10-11 ENCOUNTER — Encounter
Admission: RE | Disposition: A | Discharge status: Home / Self Care | Payer: Self-pay | Source: Ambulatory Visit | Attending: Obstetrics and Gynecology

## 2017-10-11 ENCOUNTER — Ambulatory Visit: Payer: Medicare HMO | Admitting: Anesthesiology

## 2017-10-11 ENCOUNTER — Ambulatory Visit
Admission: RE | Admit: 2017-10-11 | Discharge: 2017-10-11 | Disposition: A | Discharge status: Home / Self Care | Payer: Medicare HMO | Source: Ambulatory Visit | Attending: Obstetrics and Gynecology | Admitting: Obstetrics and Gynecology

## 2017-10-11 DIAGNOSIS — E039 Hypothyroidism, unspecified: Secondary | ICD-10-CM | POA: Insufficient documentation

## 2017-10-11 DIAGNOSIS — Z79899 Other long term (current) drug therapy: Secondary | ICD-10-CM | POA: Diagnosis not present

## 2017-10-11 DIAGNOSIS — G40909 Epilepsy, unspecified, not intractable, without status epilepticus: Secondary | ICD-10-CM | POA: Diagnosis not present

## 2017-10-11 DIAGNOSIS — Z87440 Personal history of urinary (tract) infections: Secondary | ICD-10-CM | POA: Diagnosis not present

## 2017-10-11 DIAGNOSIS — Z833 Family history of diabetes mellitus: Secondary | ICD-10-CM | POA: Diagnosis not present

## 2017-10-11 DIAGNOSIS — D070 Carcinoma in situ of endometrium: Secondary | ICD-10-CM | POA: Diagnosis not present

## 2017-10-11 DIAGNOSIS — N946 Dysmenorrhea, unspecified: Secondary | ICD-10-CM | POA: Diagnosis not present

## 2017-10-11 DIAGNOSIS — Z809 Family history of malignant neoplasm, unspecified: Secondary | ICD-10-CM | POA: Diagnosis not present

## 2017-10-11 DIAGNOSIS — N950 Postmenopausal bleeding: Secondary | ICD-10-CM | POA: Diagnosis not present

## 2017-10-11 DIAGNOSIS — Z888 Allergy status to other drugs, medicaments and biological substances status: Secondary | ICD-10-CM | POA: Diagnosis not present

## 2017-10-11 DIAGNOSIS — K219 Gastro-esophageal reflux disease without esophagitis: Secondary | ICD-10-CM | POA: Insufficient documentation

## 2017-10-11 DIAGNOSIS — N939 Abnormal uterine and vaginal bleeding, unspecified: Secondary | ICD-10-CM | POA: Diagnosis not present

## 2017-10-11 DIAGNOSIS — F419 Anxiety disorder, unspecified: Secondary | ICD-10-CM | POA: Diagnosis not present

## 2017-10-11 DIAGNOSIS — N8502 Endometrial intraepithelial neoplasia [EIN]: Secondary | ICD-10-CM | POA: Insufficient documentation

## 2017-10-11 DIAGNOSIS — F319 Bipolar disorder, unspecified: Secondary | ICD-10-CM | POA: Diagnosis not present

## 2017-10-11 HISTORY — PX: HYSTEROSCOPY W/D&C: SHX1775

## 2017-10-11 HISTORY — PX: HYSTEROSCOPY: SHX211

## 2017-10-11 LAB — URINE DRUG SCREEN, QUALITATIVE (ARMC ONLY)
AMPHETAMINES, UR SCREEN: NOT DETECTED
BENZODIAZEPINE, UR SCRN: NOT DETECTED
Barbiturates, Ur Screen: NOT DETECTED
CANNABINOID 50 NG, UR ~~LOC~~: NOT DETECTED
Cocaine Metabolite,Ur ~~LOC~~: NOT DETECTED
MDMA (Ecstasy)Ur Screen: NOT DETECTED
Methadone Scn, Ur: NOT DETECTED
OPIATE, UR SCREEN: POSITIVE — AB
PHENCYCLIDINE (PCP) UR S: NOT DETECTED
Tricyclic, Ur Screen: NOT DETECTED

## 2017-10-11 LAB — POCT PREGNANCY, URINE: Preg Test, Ur: NEGATIVE

## 2017-10-11 LAB — ABO/RH: ABO/RH(D): A POS

## 2017-10-11 SURGERY — DILATATION AND CURETTAGE /HYSTEROSCOPY
Anesthesia: General | Site: Vagina | Wound class: Clean Contaminated

## 2017-10-11 MED ORDER — KETOROLAC TROMETHAMINE 30 MG/ML IJ SOLN
INTRAMUSCULAR | Status: DC | PRN
  Administered 2017-10-11: 15 mg via INTRAVENOUS

## 2017-10-11 MED ORDER — ONDANSETRON HCL 4 MG/2ML IJ SOLN
INTRAMUSCULAR | Status: DC | PRN
  Administered 2017-10-11: 4 mg via INTRAVENOUS

## 2017-10-11 MED ORDER — LIDOCAINE HCL (PF) 2 % IJ SOLN
INTRAMUSCULAR | Status: AC
  Filled 2017-10-11: qty 10

## 2017-10-11 MED ORDER — ONDANSETRON HCL 4 MG/2ML IJ SOLN
INTRAMUSCULAR | Status: AC
  Filled 2017-10-11: qty 2

## 2017-10-11 MED ORDER — DEXTROSE IN LACTATED RINGERS 5 % IV SOLN: INTRAVENOUS | Status: DC

## 2017-10-11 MED ORDER — FENTANYL CITRATE (PF) 100 MCG/2ML IJ SOLN
INTRAMUSCULAR | Status: AC
  Filled 2017-10-11: qty 2

## 2017-10-11 MED ORDER — DEXAMETHASONE SODIUM PHOSPHATE 10 MG/ML IJ SOLN
INTRAMUSCULAR | Status: AC
  Filled 2017-10-11: qty 1

## 2017-10-11 MED ORDER — FENTANYL CITRATE (PF) 100 MCG/2ML IJ SOLN
25.0000 ug | INTRAMUSCULAR | Status: DC | PRN
  Administered 2017-10-11 (×2): 25 ug via INTRAVENOUS

## 2017-10-11 MED ORDER — KETOROLAC TROMETHAMINE 30 MG/ML IJ SOLN
INTRAMUSCULAR | Status: AC
Start: 2017-10-11 — End: 2017-10-11
  Filled 2017-10-11: qty 1

## 2017-10-11 MED ORDER — DEXAMETHASONE SODIUM PHOSPHATE 10 MG/ML IJ SOLN
INTRAMUSCULAR | Status: DC | PRN
  Administered 2017-10-11: 5 mg via INTRAVENOUS

## 2017-10-11 MED ORDER — HYDROCODONE-ACETAMINOPHEN 5-325 MG PO TABS
1.0000 | ORAL_TABLET | Freq: Once | ORAL | Status: AC
  Administered 2017-10-11: 1 via ORAL

## 2017-10-11 MED ORDER — FAMOTIDINE 20 MG PO TABS
ORAL_TABLET | ORAL | Status: AC
  Filled 2017-10-11: qty 1

## 2017-10-11 MED ORDER — PROPOFOL 10 MG/ML IV BOLUS
INTRAVENOUS | Status: AC
  Filled 2017-10-11: qty 20

## 2017-10-11 MED ORDER — FENTANYL CITRATE (PF) 100 MCG/2ML IJ SOLN
INTRAMUSCULAR | Status: DC | PRN
  Administered 2017-10-11 (×2): 50 ug via INTRAVENOUS

## 2017-10-11 MED ORDER — ONDANSETRON HCL 4 MG/2ML IJ SOLN: 4.0000 mg | Freq: Once | INTRAMUSCULAR | Status: DC | PRN

## 2017-10-11 MED ORDER — FAMOTIDINE 20 MG PO TABS
20.0000 mg | ORAL_TABLET | Freq: Once | ORAL | Status: AC
  Administered 2017-10-11: 20 mg via ORAL

## 2017-10-11 MED ORDER — SILVER NITRATE-POT NITRATE 75-25 % EX MISC
CUTANEOUS | Status: AC
  Filled 2017-10-11: qty 1

## 2017-10-11 MED ORDER — ONDANSETRON HCL 4 MG/2ML IJ SOLN: 4.0000 mg | Freq: Four times a day (QID) | INTRAMUSCULAR | Status: DC | PRN

## 2017-10-11 MED ORDER — LACTATED RINGERS IV SOLN
INTRAVENOUS | Status: DC
  Administered 2017-10-11: 08:00:00 via INTRAVENOUS

## 2017-10-11 MED ORDER — FENTANYL CITRATE (PF) 100 MCG/2ML IJ SOLN
INTRAMUSCULAR | Status: AC
  Administered 2017-10-11: 25 ug via INTRAVENOUS
  Filled 2017-10-11: qty 2

## 2017-10-11 MED ORDER — KETOROLAC TROMETHAMINE 30 MG/ML IJ SOLN
30.0000 mg | Freq: Once | INTRAMUSCULAR | Status: DC
  Filled 2017-10-11: qty 1

## 2017-10-11 MED ORDER — ONDANSETRON 4 MG PO TBDP: 4.0000 mg | ORAL_TABLET | Freq: Four times a day (QID) | ORAL | Status: DC | PRN

## 2017-10-11 MED ORDER — HYDROCODONE-ACETAMINOPHEN 5-325 MG PO TABS
ORAL_TABLET | ORAL | Status: AC
  Filled 2017-10-11: qty 1

## 2017-10-11 MED ORDER — PROPOFOL 10 MG/ML IV BOLUS
INTRAVENOUS | Status: DC | PRN
Start: 2017-10-11 — End: 2017-10-11
  Administered 2017-10-11: 150 mg via INTRAVENOUS
  Administered 2017-10-11: 50 mg via INTRAVENOUS

## 2017-10-11 MED ORDER — LIDOCAINE HCL (CARDIAC) 20 MG/ML IV SOLN
INTRAVENOUS | Status: DC | PRN
  Administered 2017-10-11: 50 mg via INTRAVENOUS

## 2017-10-11 SURGICAL SUPPLY — 22 items
BAG INFUSER PRESSURE 100CC (MISCELLANEOUS) ×4 IMPLANT
CANISTER SUCT 3000ML PPV (MISCELLANEOUS) ×4 IMPLANT
CATH ROBINSON RED A/P 16FR (CATHETERS) ×4 IMPLANT
DRSG TELFA 3X8 NADH (GAUZE/BANDAGES/DRESSINGS) ×4 IMPLANT
ELECT REM PT RETURN 9FT ADLT (ELECTROSURGICAL) ×4
ELECTRODE REM PT RTRN 9FT ADLT (ELECTROSURGICAL) ×2 IMPLANT
GLOVE BIO SURGEON STRL SZ8 (GLOVE) ×4 IMPLANT
GLOVE ORTHO TXT STRL SZ7.5 (GLOVE) ×4 IMPLANT
GOWN STRL REUS W/ TWL LRG LVL3 (GOWN DISPOSABLE) ×4 IMPLANT
GOWN STRL REUS W/ TWL XL LVL3 (GOWN DISPOSABLE) ×2 IMPLANT
GOWN STRL REUS W/TWL LRG LVL3 (GOWN DISPOSABLE) ×4
GOWN STRL REUS W/TWL XL LVL3 (GOWN DISPOSABLE) ×2
IV LACTATED RINGERS 1000ML (IV SOLUTION) ×4 IMPLANT
KIT RM TURNOVER CYSTO AR (KITS) ×4 IMPLANT
NS IRRIG 500ML POUR BTL (IV SOLUTION) ×4 IMPLANT
PACK DNC HYST (MISCELLANEOUS) ×4 IMPLANT
PAD OB MATERNITY 4.3X12.25 (PERSONAL CARE ITEMS) ×4 IMPLANT
PAD PREP 24X41 OB/GYN DISP (PERSONAL CARE ITEMS) ×4 IMPLANT
SPONGE XRAY 4X4 16PLY STRL (MISCELLANEOUS) ×4 IMPLANT
TOWEL OR 17X26 4PK STRL BLUE (TOWEL DISPOSABLE) ×4 IMPLANT
TUBING CONNECTING 10 (TUBING) ×3 IMPLANT
TUBING CONNECTING 10' (TUBING) ×1

## 2017-10-11 NOTE — Anesthesia Preprocedure Evaluation (Signed)
Anesthesia Evaluation  Patient identified by MRN, date of birth, ID band Patient awake    Reviewed: Allergy & Precautions, NPO status , Patient's Chart, lab work & pertinent test results  Airway Mallampati: I       Dental  (+) Partial Lower   Pulmonary neg pulmonary ROS,    breath sounds clear to auscultation       Cardiovascular Exercise Tolerance: Good  Rhythm:Regular Rate:Normal     Neuro/Psych Seizures -, Well Controlled,  Anxiety Depression Bipolar Disorder    GI/Hepatic Neg liver ROS, GERD  Medicated,  Endo/Other  Hypothyroidism   Renal/GU negative Renal ROS     Musculoskeletal   Abdominal Normal abdominal exam  (+)   Peds negative pediatric ROS (+)  Hematology negative hematology ROS (+)   Anesthesia Other Findings   Reproductive/Obstetrics                             Anesthesia Physical Anesthesia Plan  ASA: I  Anesthesia Plan: General   Post-op Pain Management:    Induction: Intravenous  PONV Risk Score and Plan: 2 and Ondansetron and Dexamethasone  Airway Management Planned: LMA  Additional Equipment:   Intra-op Plan:   Post-operative Plan: Extubation in OR  Informed Consent: I have reviewed the patients History and Physical, chart, labs and discussed the procedure including the risks, benefits and alternatives for the proposed anesthesia with the patient or authorized representative who has indicated his/her understanding and acceptance.     Plan Discussed with: CRNA  Anesthesia Plan Comments:         Anesthesia Quick Evaluation

## 2017-10-11 NOTE — Anesthesia Procedure Notes (Signed)
Procedure Name: LMA Insertion Date/Time: 10/11/2017 10:05 AM Performed by: Zetta Bills, CRNA Pre-anesthesia Checklist: Patient identified, Patient being monitored, Timeout performed, Emergency Drugs available and Suction available Patient Re-evaluated:Patient Re-evaluated prior to induction Oxygen Delivery Method: Circle system utilized Preoxygenation: Pre-oxygenation with 100% oxygen Induction Type: IV induction Ventilation: Mask ventilation without difficulty LMA: LMA inserted LMA Size: 4.0 Tube type: Oral Number of attempts: 1 Placement Confirmation: positive ETCO2 and breath sounds checked- equal and bilateral Tube secured with: Tape Dental Injury: Teeth and Oropharynx as per pre-operative assessment

## 2017-10-11 NOTE — Interval H&P Note (Signed)
History and Physical Interval Note:  10/11/2017 9:54 AM  [name]  has presented today for surgery, with the diagnosis of PMB,DYSMENORRHEA  The various methods of treatment have been discussed with the patient and family. After consideration of risks, benefits and other options for treatment, the patient has consented to  Procedure(s): DILATATION AND CURETTAGE /HYSTEROSCOPY (N/A) HYSTEROSCOPY (N/A) as a surgical intervention .  The patient's history has been reviewed, patient examined, no change in status, stable for surgery.  I have reviewed the patient's chart and labs.  Questions were answered to the patient's satisfaction.     Jeannie Fend

## 2017-10-11 NOTE — Op Note (Signed)
    OPERATIVE NOTE 10/11/2017 10:47 AM  PRE-OPERATIVE DIAGNOSIS:  1) PMB,DYSMENORRHEA,ENDOMETRIAL POLYP  POST-OPERATIVE DIAGNOSIS:  1)SAME  OPERATION:  HYSTEROSCOPY AND FRACTIONAL D&C  SURGEON(S): Surgeon(s) and Role:    Harlin Heys, MD - Primary   ANESTHESIA: General  ESTIMATED BLOOD LOSS: 5 mL  OPERATIVE FINDINGS: ENDOMETRIAL POLYP  SPECIMEN:  ID Type Source Tests Collected by Time Destination  1 : Endocervical Tissue [facility] benign resection SURGICAL PATHOLOGY Harlin Heys, MD 10/11/2017 1020   2 : Endometrial Tissue [facility] benign resection SURGICAL PATHOLOGY Harlin Heys, MD [id number]     COMPLICATIONS: None  DRAINS: Foley to gravity  DISPOSITION: Stable to recovery room  DESCRIPTION OF PROCEDURE:      The patient was prepped and draped in the dorsal lithotomy position and placed under general anesthesia. Her cervix was grasped with a Jacob's tenaculum.  An endocervical curettage was performed and sent separately.   Respecting the position and curvature of her cervix, it was dilated to accommodate the hysteroscope. The endometrial was explored and what appeared to be a polyp was identified.  A systematic curettage was performed in all quadrants until no additional tissue was noted. The scope was replaced and the endometrium was clean without polyp present. The tenaculum and weighted speculum was removed and the patient went to recovery room in stable condition.   Finis Bud, M.D. 10/11/2017 10:47 AM

## 2017-10-11 NOTE — Anesthesia Post-op Follow-up Note (Signed)
Anesthesia QCDR form completed.        

## 2017-10-11 NOTE — Discharge Instructions (Addendum)
Dilation and Curettage or Vacuum Curettage, Care After °These instructions give you information about caring for yourself after your procedure. Your doctor may also give you more specific instructions. Call your doctor if you have any problems or questions after your procedure. °Follow these instructions at home: °Activity °· Do not drive or use heavy machinery while taking prescription pain medicine. °· For 24 hours after your procedure, avoid driving. °· Take short walks often, followed by rest periods. Ask your doctor what activities are safe for you. After one or two days, you may be able to return to your normal activities. °· Do not lift anything that is heavier than 10 lb (4.5 kg) until your doctor approves. °· For at least 2 weeks, or as long as told by your doctor: °? Do not douche. °? Do not use tampons. °? Do not have sex. °General instructions °· Take over-the-counter and prescription medicines only as told by your doctor. This is very important if you take blood thinning medicine. °· Do not take baths, swim, or use a hot tub until your doctor approves. Take showers instead of baths. °· Wear compression stockings as told by your doctor. °· It is up to you to get the results of your procedure. Ask your doctor when your results will be ready. °· Keep all follow-up visits as told by your doctor. This is important. °Contact a doctor if: °· You have very bad cramps that get worse or do not get better with medicine. °· You have very bad pain in your belly (abdomen). °· You cannot drink fluids without throwing up (vomiting). °· You get pain in a different part of the area between your belly and thighs (pelvis). °· You have bad-smelling discharge from your vagina. °· You have a rash. °Get help right away if: °· You are bleeding a lot from your vagina. A lot of bleeding means soaking more than one sanitary pad in an hour, for 2 hours in a row. °· You have clumps of blood (blood clots) coming from your  vagina. °· You have a fever or chills. °· Your belly feels very tender or hard. °· You have chest pain. °· You have trouble breathing. °· You cough up blood. °· You feel dizzy. °· You feel light-headed. °· You pass out (faint). °· You have pain in your neck or shoulder area. °Summary °· Take short walks often, followed by rest periods. Ask your doctor what activities are safe for you. After one or two days, you may be able to return to your normal activities. °· Do not lift anything that is heavier than 10 lb (4.5 kg) until your doctor approves. °· Do not take baths, swim, or use a hot tub until your doctor approves. Take showers instead of baths. °· Contact your doctor if you have any symptoms of infection, like bad-smelling discharge from your vagina. °This information is not intended to replace advice given to you by your health care provider. Make sure you discuss any questions you have with your health care provider. °Document Released: 07/21/2008 Document Revised: 06/29/2016 Document Reviewed: 06/29/2016 °Elsevier Interactive Patient Education © 2017 [employer] °AMBULATORY SURGERY  °DISCHARGE INSTRUCTIONS ° ° °1) The drugs that you were given will stay in your system until tomorrow so for the next 24 hours you should not: ° °A) Drive an automobile °B) Make any legal decisions °C) Drink any alcoholic beverage ° ° °2) You may resume regular meals tomorrow.  Today it is better to start   with liquids and gradually work up to solid foods. ° °You may eat anything you prefer, but it is better to start with liquids, then soup and crackers, and gradually work up to solid foods. ° ° °3) Please notify your doctor immediately if you have any unusual bleeding, trouble breathing, redness and pain at the surgery site, drainage, fever, or pain not relieved by medication. ° ° ° °4) Additional Instructions: ° ° ° ° ° ° ° °Please contact your physician with any problems or Same Day Surgery at [phone], Monday through  Friday 6 am to 4 pm, or [facility] Main number at [phone]. °

## 2017-10-11 NOTE — OR Nursing (Signed)
D/C instructions discussed with pt and family. Both voice understanding.

## 2017-10-11 NOTE — Transfer of Care (Signed)
Immediate Anesthesia Transfer of Care Note  Patient: [name]  Procedure(s) Performed: DILATATION AND CURETTAGE /HYSTEROSCOPY (N/A Vagina ) HYSTEROSCOPY (N/A )  Patient Location: PACU  Anesthesia Type:General  Level of Consciousness: awake  Airway & Oxygen Therapy: Patient Spontanous Breathing  Post-op Assessment: Report given to RN  Post vital signs: stable  Last Vitals:  Vitals:   10/11/17 0823  BP: 112/80  Pulse: 89  Resp: 16  Temp: 36.4 C  SpO2: 100%    Last Pain:  Vitals:   10/11/17 0823  TempSrc: Oral  PainSc: 7          Complications: No apparent anesthesia complications

## 2017-10-12 NOTE — Anesthesia Postprocedure Evaluation (Signed)
Anesthesia Post Note  Patient: [name]  Procedure(s) Performed: DILATATION AND CURETTAGE /HYSTEROSCOPY (N/A Vagina ) HYSTEROSCOPY (N/A )  Patient location during evaluation: PACU Anesthesia Type: General Level of consciousness: awake Pain management: pain level controlled Vital Signs Assessment: post-procedure vital signs reviewed and stable Respiratory status: spontaneous breathing Cardiovascular status: stable Anesthetic complications: no     Last Vitals:  Vitals:   10/11/17 1152 10/11/17 1213  BP: 105/66 (!) 104/57  Pulse: 75 81  Resp: 16 16  Temp: 36.8 C   SpO2: 100%     Last Pain:  Vitals:   10/11/17 1218  TempSrc:   PainSc: 3                  VAN STAVEREN,Linell Meldrum

## 2017-10-25 ENCOUNTER — Encounter: Payer: Medicare HMO | Admitting: Obstetrics and Gynecology

## 2017-10-27 ENCOUNTER — Telehealth: Payer: Self-pay

## 2017-10-27 LAB — SURGICAL PATHOLOGY

## 2017-10-27 NOTE — Telephone Encounter (Signed)
Message left on pts voicemail to please call office to reschedule missed appointment.

## 2017-10-29 ENCOUNTER — Telehealth: Payer: Self-pay

## 2017-10-29 NOTE — Telephone Encounter (Signed)
Voicemail message left for pt to call and schedule an appointment.

## 2017-11-01 DIAGNOSIS — S9031XA Contusion of right foot, initial encounter: Secondary | ICD-10-CM | POA: Diagnosis not present

## 2017-11-01 DIAGNOSIS — S99921A Unspecified injury of right foot, initial encounter: Secondary | ICD-10-CM | POA: Diagnosis not present

## 2017-11-09 ENCOUNTER — Encounter: Payer: Self-pay | Admitting: Obstetrics and Gynecology

## 2017-11-09 ENCOUNTER — Ambulatory Visit (INDEPENDENT_AMBULATORY_CARE_PROVIDER_SITE_OTHER): Payer: Medicare HMO | Admitting: Obstetrics and Gynecology

## 2017-11-09 VITALS — BP 93/56 | HR 80 | Ht 61.0 in | Wt 119.3 lb

## 2017-11-09 DIAGNOSIS — C541 Malignant neoplasm of endometrium: Secondary | ICD-10-CM | POA: Diagnosis not present

## 2017-11-09 NOTE — Progress Notes (Signed)
HPI:      [name] is a 52 y.o. [id number] who LMP was Patient's last menstrual period was 09/22/2017 (exact date).  Subjective:   She presents today for follow-up of her D&C for endometrial polyp.  She reports no problems since her surgery.   Hx: The following portions of the patient's history were reviewed and updated as appropriate:             She  has a past medical history of Abnormal uterine bleeding, Allergy, Anxiety, Bipolar disorder ([place]), DDD (degenerative disc disease), Depression, HSV infection, UTI (urinary tract infection), Pertussis, Seizures ([place]), and Thyroid disease. She does not have any pertinent problems on file. She  has a past surgical history that includes Cervical spine surgery; Cesarean section (2003); Tubal ligation; Tonsillectomy; Hysteroscopy w/D&C (N/A, 10/11/2017); and Hysteroscopy (N/A, 10/11/2017). Her family history includes Cancer in her maternal grandmother and mother. She  reports that  has never smoked. she has never used smokeless tobacco. She reports that she drinks alcohol. She reports that she does not use drugs. She is allergic to naproxen.       Review of Systems:  Review of Systems  Constitutional: Denied constitutional symptoms, night sweats, recent illness, fatigue, fever, insomnia and weight loss.  Eyes: Denied eye symptoms, eye pain, photophobia, vision change and visual disturbance.  Ears/Nose/Throat/Neck: Denied ear, nose, throat or neck symptoms, hearing loss, nasal discharge, sinus congestion and sore throat.  Cardiovascular: Denied cardiovascular symptoms, arrhythmia, chest pain/pressure, edema, exercise intolerance, orthopnea and palpitations.  Respiratory: Denied pulmonary symptoms, asthma, pleuritic pain, productive sputum, cough, dyspnea and wheezing.  Gastrointestinal: Denied, gastro-esophageal reflux, melena, nausea and vomiting.  Genitourinary: Denied genitourinary symptoms including symptomatic vaginal discharge,  pelvic relaxation issues, and urinary complaints.  Musculoskeletal: Denied musculoskeletal symptoms, stiffness, swelling, muscle weakness and myalgia.  Dermatologic: Denied dermatology symptoms, rash and scar.  Neurologic: Denied neurology symptoms, dizziness, headache, neck pain and syncope.  Psychiatric: Denied psychiatric symptoms, anxiety and depression.  Endocrine: Denied endocrine symptoms including hot flashes and night sweats.   Meds:   Current Outpatient Medications on File Prior to Visit  Medication Sig Dispense Refill  . acetaminophen (TYLENOL) 500 MG tablet Take 500-1,000 mg by mouth every 6 (six) hours as needed for mild pain.     Marland Kitchen acyclovir (ZOVIRAX) 400 MG tablet TK 1 T PO BID  9  . amphetamine-dextroamphetamine (ADDERALL XR) 20 MG 24 hr capsule TK 1 C PO QAM  0  . Ascorbic Acid (VITAMIN C) 1000 MG tablet Take 1,000 mg by mouth daily.    Marland Kitchen aspirin EC 81 MG EC tablet Take 1 tablet (81 mg total) by mouth daily. 30 tablet 0  . cholecalciferol (VITAMIN D) 1000 units tablet Take 1,000 Units by mouth daily.    . clonazePAM (KLONOPIN) 1 MG tablet TK 1 T PO QD PRA  2  . Cranberry 1000 MG CAPS Take 1 capsule by mouth daily.    . cyclobenzaprine (FLEXERIL) 5 MG tablet Take 5 mg by mouth 3 (three) times daily.  2  . EPINEPHrine (EPIPEN 2-PAK) 0.3 mg/0.3 mL IJ SOAJ injection Inject 0.3 mg into the muscle once as needed (severe allergic reaction).    Marland Kitchen lamoTRIgine (LAMICTAL) 100 MG tablet Take 100 mg by mouth daily.     . Magnesium 200 MG TABS Take 200 mg by mouth daily.    . Oxycodone HCl 10 MG TABS Take 10 mg by mouth every 4 (four) hours as needed.     Marland Kitchen  RELISTOR 150 MG TABS TK 3 TS PO QD IN THE MORNING  2  . traMADol (ULTRAM) 50 MG tablet Take 1 tablet (50 mg total) by mouth every 6 (six) hours as needed. 20 tablet 0  . traZODone (DESYREL) 100 MG tablet Take 1 tablet (100 mg total) by mouth at bedtime as needed for sleep. 30 tablet 0  . valACYclovir (VALTREX) 500 MG tablet Take 500  mg by mouth 2 (two) times daily.     No current facility-administered medications on file prior to visit.     Objective:     Vitals:   11/09/17 1452  BP: (!) 93/56  Pulse: 80              Pathology results reviewed in detail with the patient.  Assessment:    [id number] Patient Active Problem List   Diagnosis Date Noted  . Hypotension 09/12/2016  . Right facial numbness 09/11/2016  . Stroke-like symptom 09/11/2016  . Seizure ([place]) 09/11/2016  . Hypothyroidism 09/11/2016  . Stroke-like symptoms 09/11/2016  . Fall   . Syncope 11/20/2015  . Scalp laceration 11/20/2015  . 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 DDD (degenerative disc disease)   . ACUTE BRONCHITIS 10/22/2009  . GERD 08/02/2009  . DEGENERATIVE DISC DISEASE 08/02/2009  . COUGH 08/02/2009  . Bipolar disorder ([place]) 08/01/2009  . Alcoholism ([place]) 08/01/2009  . BULIMIA 08/01/2009  . HEARING LOSS, BILATERAL 08/01/2009  . OSTEOPENIA 08/01/2009  . SEIZURE DISORDER 08/01/2009     1. Endometrial adenocarcinoma ([place])       Plan:            1.  Referred to GYN oncology. Orders Orders Placed This Encounter  Procedures  . Ambulatory referral to Gynecologic Oncology    No orders of the defined types were placed in this encounter.     F/U  No Follow-up on file. I spent 16 minutes with this patient of which greater than 50% was spent discussing the diagnosis of endometrial cancer.  The subsequent necessary follow-up and likely surgery.  All questions answered.  Finis Bud, M.D. 11/09/2017 3:15 PM

## 2017-11-17 ENCOUNTER — Encounter: Payer: Self-pay | Admitting: Obstetrics and Gynecology

## 2017-11-17 ENCOUNTER — Inpatient Hospital Stay: Payer: Medicare HMO | Attending: Obstetrics and Gynecology | Admitting: Obstetrics and Gynecology

## 2017-11-17 DIAGNOSIS — F1020 Alcohol dependence, uncomplicated: Secondary | ICD-10-CM | POA: Diagnosis not present

## 2017-11-17 DIAGNOSIS — Z79899 Other long term (current) drug therapy: Secondary | ICD-10-CM | POA: Diagnosis not present

## 2017-11-17 DIAGNOSIS — F332 Major depressive disorder, recurrent severe without psychotic features: Secondary | ICD-10-CM | POA: Insufficient documentation

## 2017-11-17 DIAGNOSIS — Z90710 Acquired absence of both cervix and uterus: Secondary | ICD-10-CM | POA: Diagnosis not present

## 2017-11-17 DIAGNOSIS — I700 Atherosclerosis of aorta: Secondary | ICD-10-CM | POA: Insufficient documentation

## 2017-11-17 DIAGNOSIS — Z90722 Acquired absence of ovaries, bilateral: Secondary | ICD-10-CM | POA: Diagnosis not present

## 2017-11-17 DIAGNOSIS — E039 Hypothyroidism, unspecified: Secondary | ICD-10-CM | POA: Diagnosis not present

## 2017-11-17 DIAGNOSIS — N8500 Endometrial hyperplasia, unspecified: Secondary | ICD-10-CM

## 2017-11-17 DIAGNOSIS — K219 Gastro-esophageal reflux disease without esophagitis: Secondary | ICD-10-CM | POA: Diagnosis not present

## 2017-11-17 DIAGNOSIS — M5030 Other cervical disc degeneration, unspecified cervical region: Secondary | ICD-10-CM | POA: Diagnosis not present

## 2017-11-17 DIAGNOSIS — Z7982 Long term (current) use of aspirin: Secondary | ICD-10-CM | POA: Diagnosis not present

## 2017-11-17 DIAGNOSIS — R350 Frequency of micturition: Secondary | ICD-10-CM

## 2017-11-17 LAB — URINALYSIS, COMPLETE (UACMP) WITH MICROSCOPIC
Bacteria, UA: NONE SEEN
Bilirubin Urine: NEGATIVE
Glucose, UA: NEGATIVE mg/dL
Hgb urine dipstick: NEGATIVE
Ketones, ur: NEGATIVE mg/dL
Leukocytes, UA: NEGATIVE
Nitrite: NEGATIVE
Protein, ur: NEGATIVE mg/dL
Specific Gravity, Urine: 1.009 (ref 1.005–1.030)
Squamous Epithelial / HPF: NONE SEEN
WBC, UA: NONE SEEN WBC/hpf (ref 0–5)
pH: 7 (ref 5.0–8.0)

## 2017-11-17 NOTE — Progress Notes (Signed)
New dx of endometrial cancer. Here to get plan of care -if she needs treatment. She has pelvic discomfort, feels the need to urinate often. Has UTI couple of months ago

## 2017-11-17 NOTE — Patient Instructions (Addendum)
Laparoscopy Laparoscopy is a procedure to diagnose diseases in the abdomen. During the procedure, a thin, lighted, pencil-sized instrument called a laparoscope is inserted into the abdomen through an incision. The laparoscope allows your health care provider to look at the organs inside your body. LET YOUR HEALTH CARE PROVIDER KNOW ABOUT:  Any allergies you have.  All medicines you are taking, including vitamins, herbs, eye drops, creams, and over-the-counter medicines.  Previous problems you or members of your family have had with the use of anesthetics.  Any blood disorders you have.  Previous surgeries you have had.  Medical conditions you have. RISKS AND COMPLICATIONS  Generally, this is a safe procedure. However, problems can occur, which may include:  Infection.  Bleeding.  Damage to other organs.  Allergic reaction to the anesthetics used during the procedure. BEFORE THE PROCEDURE  Do not eat or drink anything after midnight on the night before the procedure or as directed by your health care provider.  Ask your health care provider about: ? Changing or stopping your regular medicines. ? Taking medicines such as aspirin and ibuprofen. These medicines can thin your blood. Do not take these medicines before your procedure if your health care provider instructs you not to.  Plan to have someone take you home after the procedure. PROCEDURE  You may be given a medicine to help you relax (sedative).  You will be given a medicine to make you sleep (general anesthetic).  Your abdomen will be inflated with a gas. This will make your organs easier to see.  Small incisions will be made in your abdomen.  A laparoscope and other small instruments will be inserted into the abdomen through the incisions.  A tissue sample may be removed from an organ in the abdomen for examination.  The instruments will be removed from the abdomen.  The gas will be released.  The incisions  will be closed with stitches (sutures). AFTER THE PROCEDURE  Your blood pressure, heart rate, breathing rate, and blood oxygen level will be monitored often until the medicines you were given have worn off.   This information is not intended to replace advice given to you by your health care provider. Make sure you discuss any questions you have with your health care provider.     Clear Liquid Diet for GYN Oncology Patients Day Before Surgery The day before your scheduled surgery DO NOT EAT any solid foods.  We do want you to drink enough liquids, but NO MILK products.  We do not want you to be dehydrated.  Clear liquids are defined as no milk products and no pieces of any solid food. The following are all approved for you to drink the day before you surgery.  Chicken, Beef or Vegetable Broth (bouillon or consomm) - NO BROTH AFTER MIDNIGHT  Plain Jello  (no fruit)  Water  Strained lemonade or fruit punch  Gatorade (any flavor)  CLEAR Ensure or Boost Breeze  Fruit juices without pulp, such as apple, grape, or cranberry juice  Clear sodas - NO SODA AFTER MIDNIGHT  Ice Pops without bits of fruit or fruit pulp  Honey  Tea or coffee without milk or cream Any foods not on the above list should be avoided.                                                                                 DIVISION OF GYNECOLOGIC ONCOLOGY BOWEL PREP   The following instructions are extremely important to prepare for your surgery. Please follow them carefully   Step 1: Liquid Diet Instructions   The day before surgery, drink ONLY CLEAR LIQUIDS for breakfast, lunch, dinner and throughout the day.  Drink at least 64 oz of fluid.             CLEAR LIQUID EXAMPLES:             Beef, chicken or vegetable broth, sodas, coffee, tea (sugar, lemon             artificial sweeteners, honey are acceptable), juices (apple, grape, cranberry, any    mixture of clear juices). Kool-Aid, Gatorade, Jell-o (without  fruit), popsicles                          NO MILK, MILK PRODUCTS, NON-DAIRY CREAMERS    Step 2: Laxatives           The evening before surgery:   Time: around 5pm   Follow these instructions carefully.   Administer 1 Dulcolax suppository according to manufacturer instructions on the box. You will need to purchase this laxative at a pharmacy or grocery store.     Individual responses to laxatives vary; this prep may cause multiple bowel movements. It often works in 30 minutes and may take as long as 3 hours. Stay near an available bathroom.    It is important to stay hydrated. Ensure you are still drinking clear liquids.      IMPORTANT: FOR YOUR SAFETY, WE WILL HAVE TO CANCEL YOUR SURGERY IF YOU DO NOT FOLLOW THESE INSTRUCTIONS.    Do not eat anything after midnight (including gum or candy) prior to your surgery.  Avoid drinking carbonated beverages after midnight.  You can have clear liquids up until one hour before you arrive at the hospital. "Nothing by mouth" means no liquids, gum, candy, etc for one hour before your arrival time.    Bowel Symptoms After Surgery After gynecologic surgery, women often have temporary changes in bowel function (constipation and gas pain).  Following are tips to help prevent and treat common bowel problems.  It also tells you when to call the doctor.  This is important because some symptoms might be a sign of a more serious bowel problem such as obstruction (bowel blockage).  These problems are rare but can happen after gynecologic surgery.   Besides surgery, what can temporarily affect bowel function? 1. Dietary changes   2. Decreased physical activity   3.Antibiotics   4. Pain medication   How can I prevent constipation (three days or more without a stool)? 1. Include fiber in your diet: whole grains, raw or dried fruits & vegetables, prunes, prune/pear juiceDrink at least 8 glasses of liquid (preferably water) every day 2. Avoid: ? Gas forming  foods such as broccoli, beans, peas, salads, cabbage, sweet potatoes ? Greasy, fatty, or fried foods 3. Activity helps bowel function return to normal, walk around the house at least 3-4 times each day for 15 minutes or longer, if tolerated.  Rocking in a rocking chair is preferable to sitting still. 4. Stool softeners: these are not laxatives, but serve to soften the stool to avoid straining.  Take 2-4 times a day until normal bowel function returns         Examples: Colace or generic equivalent (Docusafe) 5. Bulk laxatives: provide a concentrated source of fiber.    They do not stimulate the bowel.  Take 1-2 times each day until normal bowel function return.              Examples: Citrucel, Metamucil, Fiberal, Fibercon   What can I take for "Gas Pains"? 1. Simethicone (Mylicon, Gas-X, Maalox-Gas, Mylanta-Gas) take 3-4 times a day 2. Maalox Regular - take 3-4 times a day 3. Mylanta Regular - take 3-4 times a day   What can I take if I become constipated? 1. Start with stool softeners and add additional laxatives below as needed to have a bowel movement every 1-2 days  2. Stool softeners 1-2 tablets, 2 times a day 3. Senakot 1-2 tablets, 1-2 times a day 4. Glycerin suppository can soften hard stool take once a day 5. Bisacodyl suppository once a day  6. Milk of Magnesia 30 mL 1-2 times a day 7. Fleets or tap water enema    What can I do for nausea?  1. Limit most solid foods for 24-48 hours 2. Continue eating small frequent amounts of liquids and/or bland soft foods ? Toast, crackers, cooked cereal (grits, cream of wheat, rice) 3. Benadryl: a mild anti-nausea medicine can be obtained without a prescription. May cause drowsiness, especially if taken with narcotic pain medicines 4. Contact provider for prescription nausea medication     What can I do, or take for diarrhea (more than five loose stools per day)? 1. Drink plenty of clear fluids to prevent dehydration 2. May take Kaopectate,  Pepto-Bismol, Immodium, or probiotics for 1-2 days 3. Annusol or Preparation-H can be helpful for hemorrhoids and irritated tissue around anus   When should I call the doctor?             CONSTIPATION:   Not relieved after three days following the above program VOMITING:  That contains blood, "coffee ground" material  More the three times/hour and unable to keep down nausea medication for more than eight hours  With dry mouth, dark or strong urine, feeling light-headed, dizzy, or confused  With severe abdominal pain or bloating for more than 24 hours DIARRHEA:  That continues for more then 24-48 hours despite treatment  That contains blood or tarry material  With dry mouth, dark or strong urine, feeling light~headed, dizzy, or confused FEVER:  101 F or higher along with nausea, vomiting, gas pain, diarrhea UNABLE TO:  Pass gas from rectum for more than 24 hours  Tolerate liquids by mouth for more than 24 hours     Laparoscopy, Care After Refer to this sheet in the next few weeks. These instructions provide you with information about caring for yourself after your procedure. Your health care provider may also give you more specific instructions. Your treatment has been planned according to current medical practices, but problems sometimes occur. Call your health care provider if you have any problems or questions after your procedure. WHAT TO EXPECT AFTER THE PROCEDURE After your procedure, it is common to have mild discomfort in the throat and abdomen. HOME CARE INSTRUCTIONS  Take over-the-counter and prescription medicines only as told by your health care provider.  Do not drive for 24 hours if you received a sedative.  Return to your normal activities as told by your health care provider.  Do not take baths, swim, or use a hot tub until your health care provider approves. You may shower.  Follow instructions from your health care provider about how to take care of  your incision. Make sure you: ? Wash  your hands with soap and water before you change your bandage (dressing). If soap and water are not available, use hand sanitizer. ? Change your dressing as told by your health care provider. ? Leave stitches (sutures), skin glue, or adhesive strips in place. These skin closures may need to stay in place for 2 weeks or longer. If adhesive strip edges start to loosen and curl up, you may trim the loose edges. Do not remove adhesive strips completely unless your health care provider tells you to do that.  Check your incision area every day for signs of infection. Check for: ? More redness, swelling, or pain. ? More fluid or blood. ? Warmth. ? Pus or a bad smell.  It is your responsibility to get the results of your procedure. Ask your health care provider or the department performing the procedure when your results will be ready. SEEK MEDICAL CARE IF:  There is new pain in your shoulders.  You feel light-headed or faint.  You are unable to pass gas or unable to have a bowel movement.  You feel nauseous or you vomit.  You develop a rash.  You have more redness, swelling, or pain around your incision.  You have more fluid or blood coming from your incision.  Your incision feels warm to the touch.  You have pus or a bad smell coming from your incision.  You have a fever or chills. SEEK IMMEDIATE MEDICAL CARE IF:  Your pain is getting worse.  You have ongoing vomiting.  The edges of your incision open up.  You have trouble breathing.  You have chest pain.   This information is not intended to replace advice given to you by your health care provider. Make sure you discuss any questions you have with your health care provider.   Laparoscopic Hysterectomy, Care After Refer to this sheet in the next few weeks. These instructions provide you with information on caring for yourself after your procedure. Your health care provider may also give  you more specific instructions. Your treatment has been planned according to current medical practices, but problems sometimes occur. Call your health care provider if you have any problems or questions after your procedure. What can I expect after the procedure?  Pain and bruising at the incision sites. You will be given pain medicine to control it.  Menopausal symptoms such as hot flashes, night sweats, and insomnia if your ovaries were removed.  Sore throat from the breathing tube that was inserted during surgery. Follow these instructions at home:  Only take over-the-counter or prescription medicines for pain, discomfort, or fever as directed by your health care provider.  Do not take aspirin. It can cause bleeding.  Do not drive when taking pain medicine.  Follow your health care provider's advice regarding diet, exercise, lifting, driving, and general activities.  Resume your usual diet as directed and allowed.  Get plenty of rest and sleep.  Do not douche, use tampons, or have sexual intercourse for at least 6 weeks, or until your health care provider gives you permission.  Change your bandages (dressings) as directed by your health care provider.  Monitor your temperature and notify your health care provider of a fever.  Take showers instead of baths for 2-3 weeks.  Do not drink alcohol until your health care provider gives you permission.  If you develop constipation, you may take a mild laxative with your health care provider's permission. Bran foods may help with constipation problems. Drinking enough fluids   to keep your urine clear or pale yellow may help as well.  Try to have someone home with you for 1-2 weeks to help around the house.  Keep all of your follow-up appointments as directed by your health care provider. Contact a health care provider if:  You have swelling, redness, or increasing pain around your incision sites.  You have pus coming from your  incision.  You notice a bad smell coming from your incision.  Your incision breaks open.  You feel dizzy or lightheaded.  You have pain or bleeding when you urinate.  You have persistent diarrhea.  You have persistent nausea and vomiting.  You have abnormal vaginal discharge.  You have a rash.  You have any type of abnormal reaction or develop an allergy to your medicine.  You have poor pain control with your prescribed medicine. Get help right away if:  You have chest pain or shortness of breath.  You have severe abdominal pain that is not relieved with pain medicine.  You have pain or swelling in your legs. This information is not intended to replace advice given to you by your health care provider. Make sure you discuss any questions you have with your health care provider. Document Released: 08/02/2013 Document Revised: 03/19/2016 Document Reviewed: 05/02/2013 Elsevier Interactive Patient Education  2017 [employer].     Uterine Cancer Uterine cancer is an abnormal growth of cancer tissue (malignant tumor) in the uterus. Unlike noncancerous (benign) tumors, malignant tumors can spread to other parts of the body. Uterine cancer usually occurs after menopause. However, it may also occur around the time that menopause begins. The wall of the uterus has an inner layer of tissue (endometrium) and an outer layer of muscle tissue (myometrium). The most common type of uterine cancer begins in the endometrium (endometrial cancer). Cancer that begins in the myometrium (uterine sarcoma) is very rare. What are the causes? The exact cause of this condition is not known. What increases the risk? You are more likely to develop this condition if you:  Are older than 50.  Have an enlarged endometrium (endometrial hyperplasia).  Use hormone therapy.  Are severely overweight (obese).  Use the medicine tamoxifen.  You are white (Caucasian).  Cannot bear children (are  infertile).  Have never been pregnant.  Started menstruating at an age younger than 12 years.  Are older than 52 and are still having menstrual periods.  Have a history of cancer of the ovaries, intestines, or colon or rectum (colorectal cancer).  Have a history of enlarged ovaries with small cysts (polycystic ovarian syndrome).  Have a family history of: ? Uterine cancer. ? Hereditary nonpolyposis colon cancer (HNPCC).  Have diabetes, high blood pressure, thyroid disease, or gallbladder disease.  Use long-term, high-dose birth control pills.  Have been exposed to radiation.  Smoke.  What are the signs or symptoms? Symptoms of this condition include:  Abnormal vaginal bleeding or discharge. Bleeding may start as a watery, blood-streaked flow that gradually contains more blood. This is the most common symptom. If you experience abnormal vaginal bleeding, do not assume that it is part of menopause.  Vaginal bleeding after menopause.  Unexplained weight loss.  Bleeding between periods.  Urination that is difficult, painful, or more frequent than usual.  A lump (mass) in the vagina.  Pain, bloating, or fullness in the abdomen.  Pain in the pelvic area.  Pain during sex.  How is this diagnosed? This condition may be diagnosed based on:  Your  medical history and your symptoms.  A physical and pelvic exam. Your health care provider will feel your pelvis for any growths or enlarged lymph nodes.  Blood and urine tests.  Imaging tests, such as X-rays, CT scans, ultrasound, or MRIs.  A procedure in which a thin, flexible tube with a light and camera on the end is inserted through the vagina and used to look inside the uterus (hysteroscopy).  A Pap test to check for abnormal cells in the lower part of the uterus (cervix) and the upper vagina.  Removing a tissue sample (biopsy) from the uterine lining to check for cancer cells.  Dilation and curettage (D&C). This is a  procedure that involves stretching (dilation) the cervix and scraping (curettage) the inside lining of the uterus to get a biopsy and check for cancer cells.  Your cancer will be staged to determine its severity and extent. Staging is an assessment of:  The size of the tumor.  Whether the cancer has spread.  Where the cancer has spread.  The stages of uterine cancer are as follows:  Stage I. The cancer is only found in the uterus.  Stage II. The cancer has spread to the cervix.  Stage III. The cancer has spread outside the uterus, but not outside the pelvis. The cancer may have spread to the lymph nodes in the pelvis. Lymph nodes are part of your body's disease-fighting (immune) system. Lymph nodes are found in many locations in your body, including the neck, underarm, and groin.  Stage IV. The cancer has spread to other parts of the body, such as the bladder or rectum.  How is this treated? This condition is often treated with surgery to remove:  The uterus, cervix, fallopian tubes, and ovaries (total hysterectomy).  The uterus and cervix (simple hysterectomy).  The type of hysterectomy you will have depends on the extent of your cancer. Lymph nodes near the uterus may also be removed in some cases. Treatment may also include one or more of the following:  Chemotherapy. This uses medicines to kill the cancer cells and prevent their spread.  Radiation therapy. This uses high-energy rays to kill the cancer cells and prevent the spread of cancer.  Chemoradiation. This is a combination treatment that alternates chemotherapy with radiation treatments to enhance the way radiation works.  Brachytherapy. This involves placing radioactive materials inside the body where the cancer was removed.  Hormone therapy. This includes taking medicines that lower the levels of estrogen in the body.  Follow these instructions at home: Activity  Return to your normal activities as told by your  health care provider. Ask your health care provider what activities are safe for you.  Exercise regularly as told by your health care provider.  Do not drive or use heavy machinery while taking prescription pain medicine. General instructions  Take over-the-counter and prescription medicines only as told by your health care provider.  Maintain a healthy diet.  Work with your health care provider to: ? Manage any long-term (chronic) conditions you have, such as diabetes, high blood pressure, thyroid disease, or gallbladder disease. ? Manage any side effects of your treatment.  Do not use any products that contain nicotine or tobacco, such as cigarettes and e-cigarettes. If you need help quitting, ask your health care provider.  Consider joining a support group to help you cope with stress. Your health care provider may be able to recommend a local or online support group.  Keep all follow-up visits as told  by your health care provider. This is important. Where to find more information:  American Cancer Society: https://www.cancer.[place]): https://www.cancer.gov Contact a health care provider if:  You have pain in your pelvis or abdomen that gets worse.  You cannot urinate.  You have abnormal bleeding.  You have a fever. Get help right away if:  You develop sudden or new severe symptoms, such as: ? Heavy bleeding. ? Severe weakness. ? Pain that is severe or does not get better with medicine. Summary  Uterine cancer is an abnormal growth of cancer tissue (malignant tumor) in the uterus. The most common type of uterine cancer begins in the endometrium (endometrial cancer).  This condition is often treated with surgery to remove the uterus, cervix, fallopian tubes, and ovaries (total hysterectomy) or the uterus and cervix (simple hysterectomy).  Work with your health care provider to manage any long-term (chronic) conditions you have, such as  diabetes, high blood pressure, thyroid disease, or gallbladder disease.  Consider joining a support group to help you cope with stress. Your health care provider may be able to recommend a local or online support group. This information is not intended to replace advice given to you by your health care provider. Make sure you discuss any questions you have with your health care provider. Document Released: 10/12/2005 Document Revised: 10/09/2016 Document Reviewed: 10/09/2016 Elsevier Interactive Patient Education  [employer].

## 2017-11-17 NOTE — H&P (View-Only) (Signed)
Gynecologic Oncology Consult Visit   Referring Provider: Harlin Heys,  MD [address] [place], [place] [address] [phone]   Chief Complaint: Endometrial Adenocarcinoma  Subjective:  [name] is a 52 y.o. female who is seen in consultation from Dr. Amalia Hailey for endometrial adenocarcinoma.   Initially, patient presented to Dr. Amalia Hailey for PMB. Endometrial biopsy revealed endometrial polyp. She underwent a D&C on 10/11/17. Pathology concerning for endometrial adenocarcinoma.    Gyn-Onc History 09/08/17 US Pelvic/transvaginal ultrasound showed Endometrium measuring 8.2 mm with fluid noted in endometrial canal. Left ovarian cyst measuring 3.5 x 2.8 x 2.4 cm. Right ovary was not visualized due to overlying bowel gas. Uterus measured 9.9 x 7.4 x 5.1 cm without evidence of focal masses. Echo texture was homogenous without evidence of focal masses.    09/09/17 Diagnosis:  ENDOMETRIUM, POLYP:  FRAGMENTS OF BENIGN ENDOMETRIAL POLYP, INFARCTED, AND WEAKLY  PROLIFERATIVE ENDOMETRIUM, MIXED WITH MINUTE FRAGMENTS OF BENIGN  ENDOCERVICAL GLANDS, SQUAMOUS EPITHELIUM, BLOOD, AND MUCUS. NO  HYPERPLASIA OR CARCINOMA.    10/11/17 Hysteroscopy/D&C w/ Dr. Amalia Hailey DIAGNOSIS:  A. ENDOCERVIX; CURETTAGE:  - MICROGLANDULAR HYPERPLASIA.  - ACUTE INFLAMMATION.  - NEGATIVE FOR ATYPIA AND MALIGNANCY  B. ENDOMETRIUM; CURETTAGE:  - ATYPICAL GLANDULAR PROLIFERATION, HIGHLY SUSPICIOUS FOR ENDOMETRIOID  ADENOCARCINOMA WITH PARTIAL PROGESTIN TREATMENT EFFECT, SEE COMMENT.   Comment:  The case was referred to Dr. Lattie Corns at [facility] for gynecologic pathology consultation, and the above interpretations are based on his report, [facility] report number [phone], signed out by him on 10/25/2017. Dr. Telford Nab includes the following diagnostic comment in his report:  The endometrial curettage specimen from the 12/17 procedure is very concerning for adenocarcinoma, as you  note. There are scattered fragments with very complex glands showing intraluminal papillary proliferations, along with mild nuclear atypia. However, these glands are relatively sparse, and do not form a confluent growth. Complicating interpretation is the presence of a progestin hormone effect in the background endometrium. 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   Please note that Dr. Telford Nab also reviewed the slides from previous endometrial polyp specimen ([facility] [id number], collected 09/09/17). His interpretation is in essential agreement with the original diagnosis, that there is a fragment consistent with a necrotic polyp, and that this previous sample is negative for malignancy.   She presents today to discuss management options.  Problem List: Patient Active Problem List   Diagnosis Date Noted  . 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 Hypotension 09/12/2016  . Right facial numbness 09/11/2016  . Stroke-like symptom 09/11/2016  . Seizure ([place]) 09/11/2016  . Hypothyroidism 09/11/2016  . Stroke-like symptoms 09/11/2016  . Fall   . Syncope 11/20/2015  . Scalp laceration 11/20/2015  . Major depressive disorder, recurrent severe without psychotic features ([place]) 03/23/2015  . DDD (degenerative disc disease)   . ACUTE BRONCHITIS 10/22/2009  . GERD 08/02/2009  . DEGENERATIVE DISC DISEASE 08/02/2009  . COUGH 08/02/2009  . Bipolar disorder ([place]) 08/01/2009  . Alcoholism ([place]) 08/01/2009  . BULIMIA 08/01/2009  . HEARING LOSS, BILATERAL 08/01/2009  . OSTEOPENIA 08/01/2009  . SEIZURE DISORDER 08/01/2009    Past Medical History: Past Medical History:  Diagnosis Date  . Abnormal uterine bleeding   . ADHD (attention deficit hyperactivity disorder)   . Allergy   . Anxiety   . Bipolar 1 disorder ([place])   . Bipolar disorder ([place])   . DDD (degenerative disc disease)    .  Depression   . HSV infection   . Hx: UTI (urinary tract infection)   . Pertussis   . Seizures ([place])    last seizure 12/2014   . Thyroid disease     Past Surgical History: Past Surgical History:  Procedure Laterality Date  . CERVICAL SPINE SURGERY     x 3 2001,2005,2007 c5-6  . CESAREAN SECTION  2003  . HYSTEROSCOPY N/A 10/11/2017   Procedure: HYSTEROSCOPY;  Surgeon: Harlin Heys, MD;  Location: [facility] ORS;  Service: Gynecology;  Laterality: N/A;  . HYSTEROSCOPY W/D&C N/A 10/11/2017   Procedure: DILATATION AND CURETTAGE /HYSTEROSCOPY;  Surgeon: Harlin Heys, MD;  Location: [facility] ORS;  Service: Gynecology;  Laterality: N/A;  . TONSILLECTOMY    . TUBAL LIGATION      Past Gynecologic History:  As per HPI  OB History:  OB History  Gravida Para Term Preterm AB Living  5 4 3 1 1 4   SAB TAB Ectopic Multiple Live Births          4    # Outcome Date GA Lbr Len/2nd Weight Sex Delivery Anes PTL Lv  5 Preterm 2003    M CS-Unspec   LIV  4 Term 1998    F Vag-Spont  N LIV  3 Term 1990    M Vag-Spont  N LIV  2 Term 1988    F Vag-Spont  N LIV  1 AB 1986              Family History: Family History  Problem Relation Age of Onset  . Cervical cancer Mother   . Cervical cancer Maternal Grandmother   . Polycythemia Maternal Grandmother   . Stomach cancer Father   . Colon cancer Paternal Grandmother   . Cervical cancer Cousin   . Cervical cancer Cousin   . Diabetes Mellitus II Neg Hx   . CAD Neg Hx   . Colon polyps Neg Hx   . Heart disease Neg Hx   . Rectal cancer Neg Hx     Social History: Social History   Socioeconomic History  . Marital status: Legally Separated    Spouse name: Not on file  . Number of children: 4  . Years of education: Not on file  . Highest education level: Not on file  Social Needs  . Financial resource strain: Not on file  . Food insecurity - worry: Not on file  . Food insecurity - inability: Not on file  . Transportation needs -  medical: Not on file  . Transportation needs - non-medical: Not on file  Occupational History  . Not on file  Tobacco Use  . Smoking status: Never Smoker  . Smokeless tobacco: Never Used  Substance and Sexual Activity  . Alcohol use: Yes    Alcohol/week: 1.8 oz    Types: 3 Cans of beer per week    Comment: 1-3 drinks weeky  . Drug use: No    Comment: last used in 1990's  . Sexual activity: Yes    Birth control/protection: Surgical  Other Topics Concern  . Not on file  Social History Narrative  . Not on file    Allergies: Allergies  Allergen Reactions  . Naproxen Anaphylaxis    Current Medications: Current Outpatient Medications  Medication Sig Dispense Refill  . 5-Hydroxytryptophan (5-HTP) 100 MG CAPS Take 1 capsule by mouth daily.    Marland Kitchen acetaminophen (TYLENOL) 500 MG tablet Take 500-1,000 mg by mouth every 6 (six) hours as needed for  mild pain.     Marland Kitchen acyclovir (ZOVIRAX) 400 MG tablet TK 1 T PO BID  9  . amphetamine-dextroamphetamine (ADDERALL XR) 20 MG 24 hr capsule TK 1 C PO QAM  0  . Ascorbic Acid (VITAMIN C) 1000 MG tablet Take 1,000 mg by mouth daily.    Marland Kitchen aspirin EC 81 MG EC tablet Take 1 tablet (81 mg total) by mouth daily. 30 tablet 0  . Aspirin-Salicylamide-Caffeine (BC HEADACHE POWDER PO) Take 2 Doses by mouth daily.    . cholecalciferol (VITAMIN D) 1000 units tablet Take 1,000 Units by mouth daily.    . clonazePAM (KLONOPIN) 1 MG tablet TK 1 T PO QD PRA  2  . Cranberry 1000 MG CAPS Take 1 capsule by mouth daily.    . cyclobenzaprine (FLEXERIL) 5 MG tablet Take 5 mg by mouth 3 (three) times daily.  2  . EPINEPHrine (EPIPEN 2-PAK) 0.3 mg/0.3 mL IJ SOAJ injection Inject 0.3 mg into the muscle once as needed (severe allergic reaction).    Marland Kitchen lamoTRIgine (LAMICTAL) 100 MG tablet Take 100 mg by mouth daily.     . Magnesium 200 MG TABS Take 200 mg by mouth daily.    . milk thistle 175 MG tablet Take 350 mg by mouth daily.    . Oxycodone HCl 10 MG TABS Take 10 mg by  mouth every 4 (four) hours as needed.     Marland Kitchen RELISTOR 150 MG TABS TK 3 TS PO QD IN THE MORNING  2  . traZODone (DESYREL) 100 MG tablet Take 1 tablet (100 mg total) by mouth at bedtime as needed for sleep. 30 tablet 0  . traMADol (ULTRAM) 50 MG tablet Take 1 tablet (50 mg total) by mouth every 6 (six) hours as needed. 20 tablet 0  . valACYclovir (VALTREX) 500 MG tablet Take 500 mg by mouth 2 (two) times daily.     No current facility-administered medications for this visit.     Review of Systems General: General: positive for fatigue, night sweats and weight loss, and fatigue  HEENT: positive for hearing loss  Lungs: no complaints  Cardiac: positive for palpitations  GI: positive for nausea  GU: positive for incontinence, frequency, and pain  Musculoskeletal: positive for weakness, joint and back pain  Extremities: pain in legs  Skin: no complaints  Neuro: positive for headaches  Endocrine: negative  Psych: positive for anxiety         Objective:  Physical Examination:  BP 102/66   Pulse 83   Temp 98.1 F (36.7 C) (Tympanic)   Resp 18   Ht 5\' 1"  (1.549 m)   Wt 118 lb 11.2 oz (53.8 kg)   LMP 09/22/2017 (Exact Date)   BMI 22.43 kg/m     ECOG Performance Status: 0 - Asymptomatic  General appearance: alert, cooperative and appears stated age 54: ATNC Lymph node survey: non-palpable, axillary, inguinal, supraclavicular Cardiovascular: RRR Respiratory: B CTA. Abdomen: SNDNT. Incision all well healed. No hernias, no masses, no ascites.  Extremities:No edema Skin exam - Well healed C-section incision.  Neurological exam reveals alert, oriented, normal speech, no focal findings or movement disorder noted  Pelvic: Exam chaperoned by NP. Vulva: normal appearing vulva with no masses, tenderness or lesions; Vagina: normal; Cervix: no gross lesions; Adnexa: cystic mass right adnexa 3 cm, left adnexa negative of masses or nodularity; Uterus 8-10 cm size globular, nontender.  Rectal: deferred    Lab Review CBC Latest Ref Rng & Units 10/07/2017 02/13/2017 09/11/2016  WBC 3.6 - 11.0 K/uL 4.6 8.6 -  Hemoglobin 12.0 - 16.0 g/dL 13.4 14.9 12.9  Hematocrit 35.0 - 47.0 % 40.2 43.1 38.0  Platelets 150 - 440 K/uL 239 283 -   CMP Latest Ref Rng & Units 02/13/2017 09/11/2016 09/11/2016  Glucose 65 - 99 mg/dL 113(H) 88 94  BUN 6 - 20 mg/dL 14 14 13   Creatinine 0.44 - 1.00 mg/dL 0.76 0.90 0.89  Sodium 135 - 145 mmol/L 136 138 138  Potassium 3.5 - 5.1 mmol/L 4.6 3.9 3.9  Chloride 101 - 111 mmol/L 101 99(L) 102  CO2 22 - 32 mmol/L 29 - 30  Calcium 8.9 - 10.3 mg/dL 8.8(L) - 9.4  Total Protein 6.5 - 8.1 g/dL 6.9 - 6.2(L)  Total Bilirubin 0.3 - 1.2 mg/dL 0.5 - 0.4  Alkaline Phos 38 - 126 U/L 61 - 50  AST 15 - 41 U/L 22 - 24  ALT 14 - 54 U/L 19 - 15     Radiologic Imaging: As per HPI  CT scan A/P 02/13/2017 IMPRESSION: 1. Diffuse colonic wall thickening and hyper enhancement of the colonic mucosa, concerning for a mild acute colitis. 2. Aortic atherosclerosis    Assessment:  [name] is a 52 y.o. female diagnosed with pathology suspicious for endometrial adenocarcinoma.   Medical co-morbidities complicating care: prior surgery; seizure disorder, hypothyroidism, major depressive disorder, recurrent severe without psychotic features.   Plan:   Problem List Items Addressed This Visit      Genitourinary   Glandular endometrial hyperplasia - Primary      We discussed options for management including surgery. Based on her symptoms and concerning pathology results, we recommend TLH BSO SLN assessment.   The risks of surgery were discussed in detail and she understands these to include infection; wound separation; hernia; vaginal cuff separation, injury to adjacent organs such as bowel, bladder, blood vessels, ureters and nerves; bleeding which may require blood transfusion; anesthesia risk; thromboembolic events; possible death; unforeseen  complications; possible need for re-exploration; medical complications such as heart attack, stroke, pleural effusion and pneumonia; and, if staging performed the risk of lymphedema and lymphocyst.  The patient will receive DVT and antibiotic prophylaxis as indicated.  She voiced a clear understanding.  She had the opportunity to ask questions and written informed consent was obtained today.  Suggested return to clinic in  6  Weeks post-op.     The patient's diagnosis, an outline of the further diagnostic and laboratory studies which will be required, the recommendation for surgery, and alternatives were discussed with her and her accompanying family members.  All questions were answered to their satisfaction.  A total of 90 minutes were spent with the patient/family today; 50% was spent in education, counseling and coordination of care.    I personally reviewed the patient's history, completed key elements of her exam, and was involved in decision making in conjunction with Ms. Allen. We will coordinate surgical date with [facility] gynecologic providers.  Gillis Ends, MD   CC:  Harlin Heys,  MD [address] [place], [place] [address] [phone]

## 2017-11-17 NOTE — H&P (View-Only) (Signed)
Gynecologic Oncology Consult Visit   Referring Provider: Harlin Heys,  MD [address] [place], [place] [address] [phone]   Chief Complaint: Endometrial Adenocarcinoma  Subjective:  [name] is a 52 y.o. female who is seen in consultation from Dr. Amalia Hailey for endometrial adenocarcinoma.   Initially, patient presented to Dr. Amalia Hailey for PMB. Endometrial biopsy revealed endometrial polyp. She underwent a D&C on 10/11/17. Pathology concerning for endometrial adenocarcinoma.    Gyn-Onc History 09/08/17 US Pelvic/transvaginal ultrasound showed Endometrium measuring 8.2 mm with fluid noted in endometrial canal. Left ovarian cyst measuring 3.5 x 2.8 x 2.4 cm. Right ovary was not visualized due to overlying bowel gas. Uterus measured 9.9 x 7.4 x 5.1 cm without evidence of focal masses. Echo texture was homogenous without evidence of focal masses.    09/09/17 Diagnosis:  ENDOMETRIUM, POLYP:  FRAGMENTS OF BENIGN ENDOMETRIAL POLYP, INFARCTED, AND WEAKLY  PROLIFERATIVE ENDOMETRIUM, MIXED WITH MINUTE FRAGMENTS OF BENIGN  ENDOCERVICAL GLANDS, SQUAMOUS EPITHELIUM, BLOOD, AND MUCUS. NO  HYPERPLASIA OR CARCINOMA.    10/11/17 Hysteroscopy/D&C w/ Dr. Amalia Hailey DIAGNOSIS:  A. ENDOCERVIX; CURETTAGE:  - MICROGLANDULAR HYPERPLASIA.  - ACUTE INFLAMMATION.  - NEGATIVE FOR ATYPIA AND MALIGNANCY  B. ENDOMETRIUM; CURETTAGE:  - ATYPICAL GLANDULAR PROLIFERATION, HIGHLY SUSPICIOUS FOR ENDOMETRIOID  ADENOCARCINOMA WITH PARTIAL PROGESTIN TREATMENT EFFECT, SEE COMMENT.   Comment:  The case was referred to Dr. Lattie Corns at [facility] for gynecologic pathology consultation, and the above interpretations are based on his report, [facility] report number [id number], signed out by him on 10/25/2017. Dr. Telford Nab includes the following diagnostic comment in his report:  The endometrial curettage specimen from the 12/17 procedure is very concerning for adenocarcinoma, as you  note. There are scattered fragments with very complex glands showing intraluminal papillary proliferations, along with mild nuclear atypia. However, these glands are relatively sparse, and do not form a confluent growth. Complicating interpretation is the presence of a progestin hormone effect in the background endometrium. 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   Please note that Dr. Telford Nab also reviewed the slides from previous endometrial polyp specimen ([facility] [id number], collected 09/09/17). His interpretation is in essential agreement with the original diagnosis, that there is a fragment consistent with a necrotic polyp, and that this previous sample is negative for malignancy.   She presents today to discuss management options.  Problem List: Patient Active Problem List   Diagnosis Date Noted  . 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 Hypotension 09/12/2016  . Right facial numbness 09/11/2016  . Stroke-like symptom 09/11/2016  . Seizure ([place]) 09/11/2016  . Hypothyroidism 09/11/2016  . Stroke-like symptoms 09/11/2016  . Fall   . Syncope 11/20/2015  . Scalp laceration 11/20/2015  . Major depressive disorder, recurrent severe without psychotic features ([place]) 03/23/2015  . DDD (degenerative disc disease)   . ACUTE BRONCHITIS 10/22/2009  . GERD 08/02/2009  . DEGENERATIVE DISC DISEASE 08/02/2009  . COUGH 08/02/2009  . Bipolar disorder ([place]) 08/01/2009  . Alcoholism ([place]) 08/01/2009  . BULIMIA 08/01/2009  . HEARING LOSS, BILATERAL 08/01/2009  . OSTEOPENIA 08/01/2009  . SEIZURE DISORDER 08/01/2009    Past Medical History: Past Medical History:  Diagnosis Date  . Abnormal uterine bleeding   . ADHD (attention deficit hyperactivity disorder)   . Allergy   . Anxiety   . Bipolar 1 disorder ([place])   . Bipolar disorder ([place])   . DDD (degenerative disc disease)    .  Depression   . HSV infection   . Hx: UTI (urinary tract infection)   . Pertussis   . Seizures ([place])    last seizure 12/2014   . Thyroid disease     Past Surgical History: Past Surgical History:  Procedure Laterality Date  . CERVICAL SPINE SURGERY     x 3 2001,2005,2007 c5-6  . CESAREAN SECTION  2003  . HYSTEROSCOPY N/A 10/11/2017   Procedure: HYSTEROSCOPY;  Surgeon: Harlin Heys, MD;  Location: [facility] ORS;  Service: Gynecology;  Laterality: N/A;  . HYSTEROSCOPY W/D&C N/A 10/11/2017   Procedure: DILATATION AND CURETTAGE /HYSTEROSCOPY;  Surgeon: Harlin Heys, MD;  Location: [facility] ORS;  Service: Gynecology;  Laterality: N/A;  . TONSILLECTOMY    . TUBAL LIGATION      Past Gynecologic History:  As per HPI  OB History:  OB History  Gravida Para Term Preterm AB Living  5 4 3 1 1 4   SAB TAB Ectopic Multiple Live Births          4    # Outcome Date GA Lbr Len/2nd Weight Sex Delivery Anes PTL Lv  5 Preterm 2003    M CS-Unspec   LIV  4 Term 1998    F Vag-Spont  N LIV  3 Term 1990    M Vag-Spont  N LIV  2 Term 1988    F Vag-Spont  N LIV  1 AB 1986              Family History: Family History  Problem Relation Age of Onset  . Cervical cancer Mother   . Cervical cancer Maternal Grandmother   . Polycythemia Maternal Grandmother   . Stomach cancer Father   . Colon cancer Paternal Grandmother   . Cervical cancer Cousin   . Cervical cancer Cousin   . Diabetes Mellitus II Neg Hx   . CAD Neg Hx   . Colon polyps Neg Hx   . Heart disease Neg Hx   . Rectal cancer Neg Hx     Social History: Social History   Socioeconomic History  . Marital status: Legally Separated    Spouse name: Not on file  . Number of children: 4  . Years of education: Not on file  . Highest education level: Not on file  Social Needs  . Financial resource strain: Not on file  . Food insecurity - worry: Not on file  . Food insecurity - inability: Not on file  . Transportation needs -  medical: Not on file  . Transportation needs - non-medical: Not on file  Occupational History  . Not on file  Tobacco Use  . Smoking status: Never Smoker  . Smokeless tobacco: Never Used  Substance and Sexual Activity  . Alcohol use: Yes    Alcohol/week: 1.8 oz    Types: 3 Cans of beer per week    Comment: 1-3 drinks weeky  . Drug use: No    Comment: last used in 1990's  . Sexual activity: Yes    Birth control/protection: Surgical  Other Topics Concern  . Not on file  Social History Narrative  . Not on file    Allergies: Allergies  Allergen Reactions  . Naproxen Anaphylaxis    Current Medications: Current Outpatient Medications  Medication Sig Dispense Refill  . 5-Hydroxytryptophan (5-HTP) 100 MG CAPS Take 1 capsule by mouth daily.    Marland Kitchen acetaminophen (TYLENOL) 500 MG tablet Take 500-1,000 mg by mouth every 6 (six) hours as needed for  mild pain.     Marland Kitchen acyclovir (ZOVIRAX) 400 MG tablet TK 1 T PO BID  9  . amphetamine-dextroamphetamine (ADDERALL XR) 20 MG 24 hr capsule TK 1 C PO QAM  0  . Ascorbic Acid (VITAMIN C) 1000 MG tablet Take 1,000 mg by mouth daily.    Marland Kitchen aspirin EC 81 MG EC tablet Take 1 tablet (81 mg total) by mouth daily. 30 tablet 0  . Aspirin-Salicylamide-Caffeine (BC HEADACHE POWDER PO) Take 2 Doses by mouth daily.    . cholecalciferol (VITAMIN D) 1000 units tablet Take 1,000 Units by mouth daily.    . clonazePAM (KLONOPIN) 1 MG tablet TK 1 T PO QD PRA  2  . Cranberry 1000 MG CAPS Take 1 capsule by mouth daily.    . cyclobenzaprine (FLEXERIL) 5 MG tablet Take 5 mg by mouth 3 (three) times daily.  2  . EPINEPHrine (EPIPEN 2-PAK) 0.3 mg/0.3 mL IJ SOAJ injection Inject 0.3 mg into the muscle once as needed (severe allergic reaction).    Marland Kitchen lamoTRIgine (LAMICTAL) 100 MG tablet Take 100 mg by mouth daily.     . Magnesium 200 MG TABS Take 200 mg by mouth daily.    . milk thistle 175 MG tablet Take 350 mg by mouth daily.    . Oxycodone HCl 10 MG TABS Take 10 mg by  mouth every 4 (four) hours as needed.     Marland Kitchen RELISTOR 150 MG TABS TK 3 TS PO QD IN THE MORNING  2  . traZODone (DESYREL) 100 MG tablet Take 1 tablet (100 mg total) by mouth at bedtime as needed for sleep. 30 tablet 0  . traMADol (ULTRAM) 50 MG tablet Take 1 tablet (50 mg total) by mouth every 6 (six) hours as needed. 20 tablet 0  . valACYclovir (VALTREX) 500 MG tablet Take 500 mg by mouth 2 (two) times daily.     No current facility-administered medications for this visit.     Review of Systems General: General: positive for fatigue, night sweats and weight loss, and fatigue  HEENT: positive for hearing loss  Lungs: no complaints  Cardiac: positive for palpitations  GI: positive for nausea  GU: positive for incontinence, frequency, and pain  Musculoskeletal: positive for weakness, joint and back pain  Extremities: pain in legs  Skin: no complaints  Neuro: positive for headaches  Endocrine: negative  Psych: positive for anxiety         Objective:  Physical Examination:  BP 102/66   Pulse 83   Temp 98.1 F (36.7 C) (Tympanic)   Resp 18   Ht 5\' 1"  (1.549 m)   Wt 118 lb 11.2 oz (53.8 kg)   LMP 09/22/2017 (Exact Date)   BMI 22.43 kg/m     ECOG Performance Status: 0 - Asymptomatic  General appearance: alert, cooperative and appears stated age 32: ATNC Lymph node survey: non-palpable, axillary, inguinal, supraclavicular Cardiovascular: RRR Respiratory: B CTA. Abdomen: SNDNT. Incision all well healed. No hernias, no masses, no ascites.  Extremities:No edema Skin exam - Well healed C-section incision.  Neurological exam reveals alert, oriented, normal speech, no focal findings or movement disorder noted  Pelvic: Exam chaperoned by NP. Vulva: normal appearing vulva with no masses, tenderness or lesions; Vagina: normal; Cervix: no gross lesions; Adnexa: cystic mass right adnexa 3 cm, left adnexa negative of masses or nodularity; Uterus 8-10 cm size globular, nontender.  Rectal: deferred    Lab Review CBC Latest Ref Rng & Units 10/07/2017 02/13/2017 09/11/2016  WBC 3.6 - 11.0 K/uL 4.6 8.6 -  Hemoglobin 12.0 - 16.0 g/dL 13.4 14.9 12.9  Hematocrit 35.0 - 47.0 % 40.2 43.1 38.0  Platelets 150 - 440 K/uL 239 283 -   CMP Latest Ref Rng & Units 02/13/2017 09/11/2016 09/11/2016  Glucose 65 - 99 mg/dL 113(H) 88 94  BUN 6 - 20 mg/dL 14 14 13   Creatinine 0.44 - 1.00 mg/dL 0.76 0.90 0.89  Sodium 135 - 145 mmol/L 136 138 138  Potassium 3.5 - 5.1 mmol/L 4.6 3.9 3.9  Chloride 101 - 111 mmol/L 101 99(L) 102  CO2 22 - 32 mmol/L 29 - 30  Calcium 8.9 - 10.3 mg/dL 8.8(L) - 9.4  Total Protein 6.5 - 8.1 g/dL 6.9 - 6.2(L)  Total Bilirubin 0.3 - 1.2 mg/dL 0.5 - 0.4  Alkaline Phos 38 - 126 U/L 61 - 50  AST 15 - 41 U/L 22 - 24  ALT 14 - 54 U/L 19 - 15     Radiologic Imaging: As per HPI  CT scan A/P 02/13/2017 IMPRESSION: 1. Diffuse colonic wall thickening and hyper enhancement of the colonic mucosa, concerning for a mild acute colitis. 2. Aortic atherosclerosis    Assessment:  [name] is a 52 y.o. female diagnosed with pathology suspicious for endometrial adenocarcinoma.   Medical co-morbidities complicating care: prior surgery; seizure disorder, hypothyroidism, major depressive disorder, recurrent severe without psychotic features.   Plan:   Problem List Items Addressed This Visit      Genitourinary   Glandular endometrial hyperplasia - Primary      We discussed options for management including surgery. Based on her symptoms and concerning pathology results, we recommend TLH BSO SLN assessment.   The risks of surgery were discussed in detail and she understands these to include infection; wound separation; hernia; vaginal cuff separation, injury to adjacent organs such as bowel, bladder, blood vessels, ureters and nerves; bleeding which may require blood transfusion; anesthesia risk; thromboembolic events; possible death; unforeseen  complications; possible need for re-exploration; medical complications such as heart attack, stroke, pleural effusion and pneumonia; and, if staging performed the risk of lymphedema and lymphocyst.  The patient will receive DVT and antibiotic prophylaxis as indicated.  She voiced a clear understanding.  She had the opportunity to ask questions and written informed consent was obtained today.  Suggested return to clinic in  6  Weeks post-op.     The patient's diagnosis, an outline of the further diagnostic and laboratory studies which will be required, the recommendation for surgery, and alternatives were discussed with her and her accompanying family members.  All questions were answered to their satisfaction.  A total of 90 minutes were spent with the patient/family today; 50% was spent in education, counseling and coordination of care.    I personally reviewed the patient's history, completed key elements of her exam, and was involved in decision making in conjunction with Ms. Allen. We will coordinate surgical date with [facility] gynecologic providers.  Gillis Ends, MD   CC:  Harlin Heys,  MD [address] [place], [place] [address] [phone]

## 2017-11-17 NOTE — Progress Notes (Addendum)
Gynecologic Oncology Consult Visit   Referring Provider: Harlin Heys,  MD [address] [place], [place] [address] [phone]   Chief Complaint: Endometrial Adenocarcinoma  Subjective:  [name] is a 52 y.o. female who is seen in consultation from Dr. Amalia Hailey for endometrial adenocarcinoma.   Initially, patient presented to Dr. Amalia Hailey for PMB. Endometrial biopsy revealed endometrial polyp. She underwent a D&C on 10/11/17. Pathology concerning for endometrial adenocarcinoma.    Gyn-Onc History 09/08/17 US Pelvic/transvaginal ultrasound showed Endometrium measuring 8.2 mm with fluid noted in endometrial canal. Left ovarian cyst measuring 3.5 x 2.8 x 2.4 cm. Right ovary was not visualized due to overlying bowel gas. Uterus measured 9.9 x 7.4 x 5.1 cm without evidence of focal masses. Echo texture was homogenous without evidence of focal masses.    09/09/17 Diagnosis:  ENDOMETRIUM, POLYP:  FRAGMENTS OF BENIGN ENDOMETRIAL POLYP, INFARCTED, AND WEAKLY  PROLIFERATIVE ENDOMETRIUM, MIXED WITH MINUTE FRAGMENTS OF BENIGN  ENDOCERVICAL GLANDS, SQUAMOUS EPITHELIUM, BLOOD, AND MUCUS. NO  HYPERPLASIA OR CARCINOMA.    10/11/17 Hysteroscopy/D&C w/ Dr. Amalia Hailey DIAGNOSIS:  A. ENDOCERVIX; CURETTAGE:  - MICROGLANDULAR HYPERPLASIA.  - ACUTE INFLAMMATION.  - NEGATIVE FOR ATYPIA AND MALIGNANCY  B. ENDOMETRIUM; CURETTAGE:  - ATYPICAL GLANDULAR PROLIFERATION, HIGHLY SUSPICIOUS FOR ENDOMETRIOID  ADENOCARCINOMA WITH PARTIAL PROGESTIN TREATMENT EFFECT, SEE COMMENT.   Comment:  The case was referred to Dr. Lattie Corns at [facility] for gynecologic pathology consultation, and the above interpretations are based on his report, [facility] report number [id number], signed out by him on 10/25/2017. Dr. Telford Nab includes the following diagnostic comment in his report:  The endometrial curettage specimen from the 12/17 procedure is very concerning for adenocarcinoma, as you  note. There are scattered fragments with very complex glands showing intraluminal papillary proliferations, along with mild nuclear atypia. However, these glands are relatively sparse, and do not form a confluent growth. Complicating interpretation is the presence of a progestin hormone effect in the background endometrium. 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   Please note that Dr. Telford Nab also reviewed the slides from previous endometrial polyp specimen ([facility] [id number], collected 09/09/17). His interpretation is in essential agreement with the original diagnosis, that there is a fragment consistent with a necrotic polyp, and that this previous sample is negative for malignancy.   She presents today to discuss management options.  Problem List: Patient Active Problem List   Diagnosis Date Noted  . 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 Hypotension 09/12/2016  . Right facial numbness 09/11/2016  . Stroke-like symptom 09/11/2016  . Seizure ([place]) 09/11/2016  . Hypothyroidism 09/11/2016  . Stroke-like symptoms 09/11/2016  . Fall   . Syncope 11/20/2015  . Scalp laceration 11/20/2015  . Major depressive disorder, recurrent severe without psychotic features ([place]) 03/23/2015  . DDD (degenerative disc disease)   . ACUTE BRONCHITIS 10/22/2009  . GERD 08/02/2009  . DEGENERATIVE DISC DISEASE 08/02/2009  . COUGH 08/02/2009  . Bipolar disorder ([place]) 08/01/2009  . Alcoholism ([place]) 08/01/2009  . BULIMIA 08/01/2009  . HEARING LOSS, BILATERAL 08/01/2009  . OSTEOPENIA 08/01/2009  . SEIZURE DISORDER 08/01/2009    Past Medical History: Past Medical History:  Diagnosis Date  . Abnormal uterine bleeding   . ADHD (attention deficit hyperactivity disorder)   . Allergy   . Anxiety   . Bipolar 1 disorder ([place])   . Bipolar disorder ([place])   . DDD (degenerative disc disease)    .  Depression   . HSV infection   . Hx: UTI (urinary tract infection)   . Pertussis   . Seizures ([place])    last seizure 12/2014   . Thyroid disease     Past Surgical History: Past Surgical History:  Procedure Laterality Date  . CERVICAL SPINE SURGERY     x 3 2001,2005,2007 c5-6  . CESAREAN SECTION  2003  . HYSTEROSCOPY N/A 10/11/2017   Procedure: HYSTEROSCOPY;  Surgeon: Harlin Heys, MD;  Location: [facility] ORS;  Service: Gynecology;  Laterality: N/A;  . HYSTEROSCOPY W/D&C N/A 10/11/2017   Procedure: DILATATION AND CURETTAGE /HYSTEROSCOPY;  Surgeon: Harlin Heys, MD;  Location: [facility] ORS;  Service: Gynecology;  Laterality: N/A;  . TONSILLECTOMY    . TUBAL LIGATION      Past Gynecologic History:  As per HPI  OB History:  OB History  Gravida Para Term Preterm AB Living  5 4 3 1 1 4   SAB TAB Ectopic Multiple Live Births          4    # Outcome Date GA Lbr Len/2nd Weight Sex Delivery Anes PTL Lv  5 Preterm 2003    M CS-Unspec   LIV  4 Term 1998    F Vag-Spont  N LIV  3 Term 1990    M Vag-Spont  N LIV  2 Term 1988    F Vag-Spont  N LIV  1 AB 1986              Family History: Family History  Problem Relation Age of Onset  . Cervical cancer Mother   . Cervical cancer Maternal Grandmother   . Polycythemia Maternal Grandmother   . Stomach cancer Father   . Colon cancer Paternal Grandmother   . Cervical cancer Cousin   . Cervical cancer Cousin   . Diabetes Mellitus II Neg Hx   . CAD Neg Hx   . Colon polyps Neg Hx   . Heart disease Neg Hx   . Rectal cancer Neg Hx     Social History: Social History   Socioeconomic History  . Marital status: Legally Separated    Spouse name: Not on file  . Number of children: 4  . Years of education: Not on file  . Highest education level: Not on file  Social Needs  . Financial resource strain: Not on file  . Food insecurity - worry: Not on file  . Food insecurity - inability: Not on file  . Transportation needs -  medical: Not on file  . Transportation needs - non-medical: Not on file  Occupational History  . Not on file  Tobacco Use  . Smoking status: Never Smoker  . Smokeless tobacco: Never Used  Substance and Sexual Activity  . Alcohol use: Yes    Alcohol/week: 1.8 oz    Types: 3 Cans of beer per week    Comment: 1-3 drinks weeky  . Drug use: No    Comment: last used in 1990's  . Sexual activity: Yes    Birth control/protection: Surgical  Other Topics Concern  . Not on file  Social History Narrative  . Not on file    Allergies: Allergies  Allergen Reactions  . Naproxen Anaphylaxis    Current Medications: Current Outpatient Medications  Medication Sig Dispense Refill  . 5-Hydroxytryptophan (5-HTP) 100 MG CAPS Take 1 capsule by mouth daily.    Marland Kitchen acetaminophen (TYLENOL) 500 MG tablet Take 500-1,000 mg by mouth every 6 (six) hours as needed for  mild pain.     Marland Kitchen acyclovir (ZOVIRAX) 400 MG tablet TK 1 T PO BID  9  . amphetamine-dextroamphetamine (ADDERALL XR) 20 MG 24 hr capsule TK 1 C PO QAM  0  . Ascorbic Acid (VITAMIN C) 1000 MG tablet Take 1,000 mg by mouth daily.    Marland Kitchen aspirin EC 81 MG EC tablet Take 1 tablet (81 mg total) by mouth daily. 30 tablet 0  . Aspirin-Salicylamide-Caffeine (BC HEADACHE POWDER PO) Take 2 Doses by mouth daily.    . cholecalciferol (VITAMIN D) 1000 units tablet Take 1,000 Units by mouth daily.    . clonazePAM (KLONOPIN) 1 MG tablet TK 1 T PO QD PRA  2  . Cranberry 1000 MG CAPS Take 1 capsule by mouth daily.    . cyclobenzaprine (FLEXERIL) 5 MG tablet Take 5 mg by mouth 3 (three) times daily.  2  . EPINEPHrine (EPIPEN 2-PAK) 0.3 mg/0.3 mL IJ SOAJ injection Inject 0.3 mg into the muscle once as needed (severe allergic reaction).    Marland Kitchen lamoTRIgine (LAMICTAL) 100 MG tablet Take 100 mg by mouth daily.     . Magnesium 200 MG TABS Take 200 mg by mouth daily.    . milk thistle 175 MG tablet Take 350 mg by mouth daily.    . Oxycodone HCl 10 MG TABS Take 10 mg by  mouth every 4 (four) hours as needed.     Marland Kitchen RELISTOR 150 MG TABS TK 3 TS PO QD IN THE MORNING  2  . traZODone (DESYREL) 100 MG tablet Take 1 tablet (100 mg total) by mouth at bedtime as needed for sleep. 30 tablet 0  . traMADol (ULTRAM) 50 MG tablet Take 1 tablet (50 mg total) by mouth every 6 (six) hours as needed. 20 tablet 0  . valACYclovir (VALTREX) 500 MG tablet Take 500 mg by mouth 2 (two) times daily.     No current facility-administered medications for this visit.     Review of Systems General: General: positive for fatigue, night sweats and weight loss, and fatigue  HEENT: positive for hearing loss  Lungs: no complaints  Cardiac: positive for palpitations  GI: positive for nausea  GU: positive for incontinence, frequency, and pain  Musculoskeletal: positive for weakness, joint and back pain  Extremities: pain in legs  Skin: no complaints  Neuro: positive for headaches  Endocrine: negative  Psych: positive for anxiety         Objective:  Physical Examination:  BP 102/66   Pulse 83   Temp 98.1 F (36.7 C) (Tympanic)   Resp 18   Ht 5\' 1"  (1.549 m)   Wt 118 lb 11.2 oz (53.8 kg)   LMP 09/22/2017 (Exact Date)   BMI 22.43 kg/m     ECOG Performance Status: 0 - Asymptomatic  General appearance: alert, cooperative and appears stated age 18: ATNC Lymph node survey: non-palpable, axillary, inguinal, supraclavicular Cardiovascular: RRR Respiratory: B CTA. Abdomen: SNDNT. Incision all well healed. No hernias, no masses, no ascites.  Extremities:No edema Skin exam - Well healed C-section incision.  Neurological exam reveals alert, oriented, normal speech, no focal findings or movement disorder noted  Pelvic: Exam chaperoned by NP. Vulva: normal appearing vulva with no masses, tenderness or lesions; Vagina: normal; Cervix: no gross lesions; Adnexa: cystic mass right adnexa 3 cm, left adnexa negative of masses or nodularity; Uterus 8-10 cm size globular, nontender.  Rectal: deferred    Lab Review CBC Latest Ref Rng & Units 10/07/2017 02/13/2017 09/11/2016  WBC 3.6 - 11.0 K/uL 4.6 8.6 -  Hemoglobin 12.0 - 16.0 g/dL 13.4 14.9 12.9  Hematocrit 35.0 - 47.0 % 40.2 43.1 38.0  Platelets 150 - 440 K/uL 239 283 -   CMP Latest Ref Rng & Units 02/13/2017 09/11/2016 09/11/2016  Glucose 65 - 99 mg/dL 113(H) 88 94  BUN 6 - 20 mg/dL 14 14 13   Creatinine 0.44 - 1.00 mg/dL 0.76 0.90 0.89  Sodium 135 - 145 mmol/L 136 138 138  Potassium 3.5 - 5.1 mmol/L 4.6 3.9 3.9  Chloride 101 - 111 mmol/L 101 99(L) 102  CO2 22 - 32 mmol/L 29 - 30  Calcium 8.9 - 10.3 mg/dL 8.8(L) - 9.4  Total Protein 6.5 - 8.1 g/dL 6.9 - 6.2(L)  Total Bilirubin 0.3 - 1.2 mg/dL 0.5 - 0.4  Alkaline Phos 38 - 126 U/L 61 - 50  AST 15 - 41 U/L 22 - 24  ALT 14 - 54 U/L 19 - 15     Radiologic Imaging: As per HPI  CT scan A/P 02/13/2017 IMPRESSION: 1. Diffuse colonic wall thickening and hyper enhancement of the colonic mucosa, concerning for a mild acute colitis. 2. Aortic atherosclerosis    Assessment:  [name] is a 52 y.o. female diagnosed with pathology suspicious for endometrial adenocarcinoma.   Medical co-morbidities complicating care: prior surgery; seizure disorder, hypothyroidism, major depressive disorder, recurrent severe without psychotic features.   Plan:   Problem List Items Addressed This Visit      Genitourinary   Glandular endometrial hyperplasia - Primary      We discussed options for management including surgery. Based on her symptoms and concerning pathology results, we recommend TLH BSO SLN assessment.   The risks of surgery were discussed in detail and she understands these to include infection; wound separation; hernia; vaginal cuff separation, injury to adjacent organs such as bowel, bladder, blood vessels, ureters and nerves; bleeding which may require blood transfusion; anesthesia risk; thromboembolic events; possible death; unforeseen  complications; possible need for re-exploration; medical complications such as heart attack, stroke, pleural effusion and pneumonia; and, if staging performed the risk of lymphedema and lymphocyst.  The patient will receive DVT and antibiotic prophylaxis as indicated.  She voiced a clear understanding.  She had the opportunity to ask questions and written informed consent was obtained today.  Suggested return to clinic in  6  Weeks post-op.     The patient's diagnosis, an outline of the further diagnostic and laboratory studies which will be required, the recommendation for surgery, and alternatives were discussed with her and her accompanying family members.  All questions were answered to their satisfaction.  A total of 90 minutes were spent with the patient/family today; 50% was spent in education, counseling and coordination of care.    I personally reviewed the patient's history, completed key elements of her exam, and was involved in decision making in conjunction with Ms. Judine Arciniega. We will coordinate surgical date with [facility] gynecologic providers.  Gillis Ends, MD   CC:  Harlin Heys,  MD [address] [place], [place] [address] [phone]

## 2017-11-17 NOTE — H&P (Signed)
Gynecologic Oncology Consult Visit   Referring Provider: Harlin Heys,  MD [address] [place], [place] [address] [phone]   Chief Complaint: Endometrial Adenocarcinoma  Subjective:  [name] is a 52 y.o. female who is seen in consultation from Dr. Amalia Hailey for endometrial adenocarcinoma.   Initially, patient presented to Dr. Amalia Hailey for PMB. Endometrial biopsy revealed endometrial polyp. She underwent a D&C on 10/11/17. Pathology concerning for endometrial adenocarcinoma.    Gyn-Onc History 09/08/17 US Pelvic/transvaginal ultrasound showed Endometrium measuring 8.2 mm with fluid noted in endometrial canal. Left ovarian cyst measuring 3.5 x 2.8 x 2.4 cm. Right ovary was not visualized due to overlying bowel gas. Uterus measured 9.9 x 7.4 x 5.1 cm without evidence of focal masses. Echo texture was homogenous without evidence of focal masses.    09/09/17 Diagnosis:  ENDOMETRIUM, POLYP:  FRAGMENTS OF BENIGN ENDOMETRIAL POLYP, INFARCTED, AND WEAKLY  PROLIFERATIVE ENDOMETRIUM, MIXED WITH MINUTE FRAGMENTS OF BENIGN  ENDOCERVICAL GLANDS, SQUAMOUS EPITHELIUM, BLOOD, AND MUCUS. NO  HYPERPLASIA OR CARCINOMA.    10/11/17 Hysteroscopy/D&C w/ Dr. Amalia Hailey DIAGNOSIS:  A. ENDOCERVIX; CURETTAGE:  - MICROGLANDULAR HYPERPLASIA.  - ACUTE INFLAMMATION.  - NEGATIVE FOR ATYPIA AND MALIGNANCY  B. ENDOMETRIUM; CURETTAGE:  - ATYPICAL GLANDULAR PROLIFERATION, HIGHLY SUSPICIOUS FOR ENDOMETRIOID  ADENOCARCINOMA WITH PARTIAL PROGESTIN TREATMENT EFFECT, SEE COMMENT.   Comment:  The case was referred to Dr. Lattie Corns at [facility] for gynecologic pathology consultation, and the above interpretations are based on his report, [facility] report number [phone], signed out by him on 10/25/2017. Dr. Telford Nab includes the following diagnostic comment in his report:  The endometrial curettage specimen from the 12/17 procedure is very concerning for adenocarcinoma, as you  note. There are scattered fragments with very complex glands showing intraluminal papillary proliferations, along with mild nuclear atypia. However, these glands are relatively sparse, and do not form a confluent growth. Complicating interpretation is the presence of a progestin hormone effect in the background endometrium. 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   Please note that Dr. Telford Nab also reviewed the slides from previous endometrial polyp specimen ([facility] [id number], collected 09/09/17). His interpretation is in essential agreement with the original diagnosis, that there is a fragment consistent with a necrotic polyp, and that this previous sample is negative for malignancy.   She presents today to discuss management options.  Problem List: Patient Active Problem List   Diagnosis Date Noted  . 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 Hypotension 09/12/2016  . Right facial numbness 09/11/2016  . Stroke-like symptom 09/11/2016  . Seizure ([place]) 09/11/2016  . Hypothyroidism 09/11/2016  . Stroke-like symptoms 09/11/2016  . Fall   . Syncope 11/20/2015  . Scalp laceration 11/20/2015  . Major depressive disorder, recurrent severe without psychotic features ([place]) 03/23/2015  . DDD (degenerative disc disease)   . ACUTE BRONCHITIS 10/22/2009  . GERD 08/02/2009  . DEGENERATIVE DISC DISEASE 08/02/2009  . COUGH 08/02/2009  . Bipolar disorder ([place]) 08/01/2009  . Alcoholism ([place]) 08/01/2009  . BULIMIA 08/01/2009  . HEARING LOSS, BILATERAL 08/01/2009  . OSTEOPENIA 08/01/2009  . SEIZURE DISORDER 08/01/2009    Past Medical History: Past Medical History:  Diagnosis Date  . Abnormal uterine bleeding   . ADHD (attention deficit hyperactivity disorder)   . Allergy   . Anxiety   . Bipolar 1 disorder ([place])   . Bipolar disorder ([place])   . DDD (degenerative disc disease)    .  Depression   . HSV infection   . Hx: UTI (urinary tract infection)   . Pertussis   . Seizures ([place])    last seizure 12/2014   . Thyroid disease     Past Surgical History: Past Surgical History:  Procedure Laterality Date  . CERVICAL SPINE SURGERY     x 3 2001,2005,2007 c5-6  . CESAREAN SECTION  2003  . HYSTEROSCOPY N/A 10/11/2017   Procedure: HYSTEROSCOPY;  Surgeon: Harlin Heys, MD;  Location: [facility] ORS;  Service: Gynecology;  Laterality: N/A;  . HYSTEROSCOPY W/D&C N/A 10/11/2017   Procedure: DILATATION AND CURETTAGE /HYSTEROSCOPY;  Surgeon: Harlin Heys, MD;  Location: [facility] ORS;  Service: Gynecology;  Laterality: N/A;  . TONSILLECTOMY    . TUBAL LIGATION      Past Gynecologic History:  As per HPI  OB History:  OB History  Gravida Para Term Preterm AB Living  5 4 3 1 1 4   SAB TAB Ectopic Multiple Live Births          4    # Outcome Date GA Lbr Len/2nd Weight Sex Delivery Anes PTL Lv  5 Preterm 2003    M CS-Unspec   LIV  4 Term 1998    F Vag-Spont  N LIV  3 Term 1990    M Vag-Spont  N LIV  2 Term 1988    F Vag-Spont  N LIV  1 AB 1986              Family History: Family History  Problem Relation Age of Onset  . Cervical cancer Mother   . Cervical cancer Maternal Grandmother   . Polycythemia Maternal Grandmother   . Stomach cancer Father   . Colon cancer Paternal Grandmother   . Cervical cancer Cousin   . Cervical cancer Cousin   . Diabetes Mellitus II Neg Hx   . CAD Neg Hx   . Colon polyps Neg Hx   . Heart disease Neg Hx   . Rectal cancer Neg Hx     Social History: Social History   Socioeconomic History  . Marital status: Legally Separated    Spouse name: Not on file  . Number of children: 4  . Years of education: Not on file  . Highest education level: Not on file  Social Needs  . Financial resource strain: Not on file  . Food insecurity - worry: Not on file  . Food insecurity - inability: Not on file  . Transportation needs -  medical: Not on file  . Transportation needs - non-medical: Not on file  Occupational History  . Not on file  Tobacco Use  . Smoking status: Never Smoker  . Smokeless tobacco: Never Used  Substance and Sexual Activity  . Alcohol use: Yes    Alcohol/week: 1.8 oz    Types: 3 Cans of beer per week    Comment: 1-3 drinks weeky  . Drug use: No    Comment: last used in 1990's  . Sexual activity: Yes    Birth control/protection: Surgical  Other Topics Concern  . Not on file  Social History Narrative  . Not on file    Allergies: Allergies  Allergen Reactions  . Naproxen Anaphylaxis    Current Medications: Current Outpatient Medications  Medication Sig Dispense Refill  . 5-Hydroxytryptophan (5-HTP) 100 MG CAPS Take 1 capsule by mouth daily.    Marland Kitchen acetaminophen (TYLENOL) 500 MG tablet Take 500-1,000 mg by mouth every 6 (six) hours as needed for  mild pain.     Marland Kitchen acyclovir (ZOVIRAX) 400 MG tablet TK 1 T PO BID  9  . amphetamine-dextroamphetamine (ADDERALL XR) 20 MG 24 hr capsule TK 1 C PO QAM  0  . Ascorbic Acid (VITAMIN C) 1000 MG tablet Take 1,000 mg by mouth daily.    Marland Kitchen aspirin EC 81 MG EC tablet Take 1 tablet (81 mg total) by mouth daily. 30 tablet 0  . Aspirin-Salicylamide-Caffeine (BC HEADACHE POWDER PO) Take 2 Doses by mouth daily.    . cholecalciferol (VITAMIN D) 1000 units tablet Take 1,000 Units by mouth daily.    . clonazePAM (KLONOPIN) 1 MG tablet TK 1 T PO QD PRA  2  . Cranberry 1000 MG CAPS Take 1 capsule by mouth daily.    . cyclobenzaprine (FLEXERIL) 5 MG tablet Take 5 mg by mouth 3 (three) times daily.  2  . EPINEPHrine (EPIPEN 2-PAK) 0.3 mg/0.3 mL IJ SOAJ injection Inject 0.3 mg into the muscle once as needed (severe allergic reaction).    Marland Kitchen lamoTRIgine (LAMICTAL) 100 MG tablet Take 100 mg by mouth daily.     . Magnesium 200 MG TABS Take 200 mg by mouth daily.    . milk thistle 175 MG tablet Take 350 mg by mouth daily.    . Oxycodone HCl 10 MG TABS Take 10 mg by  mouth every 4 (four) hours as needed.     Marland Kitchen RELISTOR 150 MG TABS TK 3 TS PO QD IN THE MORNING  2  . traZODone (DESYREL) 100 MG tablet Take 1 tablet (100 mg total) by mouth at bedtime as needed for sleep. 30 tablet 0  . traMADol (ULTRAM) 50 MG tablet Take 1 tablet (50 mg total) by mouth every 6 (six) hours as needed. 20 tablet 0  . valACYclovir (VALTREX) 500 MG tablet Take 500 mg by mouth 2 (two) times daily.     No current facility-administered medications for this visit.     Review of Systems General: General: positive for fatigue, night sweats and weight loss, and fatigue  HEENT: positive for hearing loss  Lungs: no complaints  Cardiac: positive for palpitations  GI: positive for nausea  GU: positive for incontinence, frequency, and pain  Musculoskeletal: positive for weakness, joint and back pain  Extremities: pain in legs  Skin: no complaints  Neuro: positive for headaches  Endocrine: negative  Psych: positive for anxiety         Objective:  Physical Examination:  BP 102/66   Pulse 83   Temp 98.1 F (36.7 C) (Tympanic)   Resp 18   Ht 5\' 1"  (1.549 m)   Wt 118 lb 11.2 oz (53.8 kg)   LMP 09/22/2017 (Exact Date)   BMI 22.43 kg/m     ECOG Performance Status: 0 - Asymptomatic  General appearance: alert, cooperative and appears stated age 37: ATNC Lymph node survey: non-palpable, axillary, inguinal, supraclavicular Cardiovascular: RRR Respiratory: B CTA. Abdomen: SNDNT. Incision all well healed. No hernias, no masses, no ascites.  Extremities:No edema Skin exam - Well healed C-section incision.  Neurological exam reveals alert, oriented, normal speech, no focal findings or movement disorder noted  Pelvic: Exam chaperoned by NP. Vulva: normal appearing vulva with no masses, tenderness or lesions; Vagina: normal; Cervix: no gross lesions; Adnexa: cystic mass right adnexa 3 cm, left adnexa negative of masses or nodularity; Uterus 8-10 cm size globular, nontender.  Rectal: deferred    Lab Review CBC Latest Ref Rng & Units 10/07/2017 02/13/2017 09/11/2016  WBC 3.6 - 11.0 K/uL 4.6 8.6 -  Hemoglobin 12.0 - 16.0 g/dL 13.4 14.9 12.9  Hematocrit 35.0 - 47.0 % 40.2 43.1 38.0  Platelets 150 - 440 K/uL 239 283 -   CMP Latest Ref Rng & Units 02/13/2017 09/11/2016 09/11/2016  Glucose 65 - 99 mg/dL 113(H) 88 94  BUN 6 - 20 mg/dL 14 14 13   Creatinine 0.44 - 1.00 mg/dL 0.76 0.90 0.89  Sodium 135 - 145 mmol/L 136 138 138  Potassium 3.5 - 5.1 mmol/L 4.6 3.9 3.9  Chloride 101 - 111 mmol/L 101 99(L) 102  CO2 22 - 32 mmol/L 29 - 30  Calcium 8.9 - 10.3 mg/dL 8.8(L) - 9.4  Total Protein 6.5 - 8.1 g/dL 6.9 - 6.2(L)  Total Bilirubin 0.3 - 1.2 mg/dL 0.5 - 0.4  Alkaline Phos 38 - 126 U/L 61 - 50  AST 15 - 41 U/L 22 - 24  ALT 14 - 54 U/L 19 - 15     Radiologic Imaging: As per HPI  CT scan A/P 02/13/2017 IMPRESSION: 1. Diffuse colonic wall thickening and hyper enhancement of the colonic mucosa, concerning for a mild acute colitis. 2. Aortic atherosclerosis    Assessment:  [name] is a 52 y.o. female diagnosed with pathology suspicious for endometrial adenocarcinoma.   Medical co-morbidities complicating care: prior surgery; seizure disorder, hypothyroidism, major depressive disorder, recurrent severe without psychotic features.   Plan:   Problem List Items Addressed This Visit      Genitourinary   Glandular endometrial hyperplasia - Primary      We discussed options for management including surgery. Based on her symptoms and concerning pathology results, we recommend TLH BSO SLN assessment.   The risks of surgery were discussed in detail and she understands these to include infection; wound separation; hernia; vaginal cuff separation, injury to adjacent organs such as bowel, bladder, blood vessels, ureters and nerves; bleeding which may require blood transfusion; anesthesia risk; thromboembolic events; possible death; unforeseen  complications; possible need for re-exploration; medical complications such as heart attack, stroke, pleural effusion and pneumonia; and, if staging performed the risk of lymphedema and lymphocyst.  The patient will receive DVT and antibiotic prophylaxis as indicated.  She voiced a clear understanding.  She had the opportunity to ask questions and written informed consent was obtained today.  Suggested return to clinic in  6  Weeks post-op.     The patient's diagnosis, an outline of the further diagnostic and laboratory studies which will be required, the recommendation for surgery, and alternatives were discussed with her and her accompanying family members.  All questions were answered to their satisfaction.  A total of 90 minutes were spent with the patient/family today; 50% was spent in education, counseling and coordination of care.    I personally reviewed the patient's history, completed key elements of her exam, and was involved in decision making in conjunction with Ms. Allen. We will coordinate surgical date with [place] [facility] gynecologic providers.  Gillis Ends, MD   CC:  Harlin Heys,  MD [address] [place], [place] [address] [phone]

## 2017-11-17 NOTE — Addendum Note (Signed)
Addended by: Gillis Ends on: 11/17/2017 05:18 PM   Modules accepted: Orders, SmartSet

## 2017-11-17 NOTE — Progress Notes (Signed)
Pre and post operative teaching completed. All questions answered and copty of instructions provided in AVS. Oncology Nurse Navigator Documentation  Navigator Location: [facility] (11/17/17 1600)   )Navigator Encounter Type: Initial GynOnc (11/17/17 1600)                     Patient Visit Type: GynOnc (11/17/17 1600)   Barriers/Navigation Needs: Education (11/17/17 1600)   Interventions: Education;Coordination of Care (11/17/17 1600)     Education Method: Verbal;Written;Teach-back (11/17/17 1600)                Time Spent with Patient: 30 (11/17/17 1600)

## 2017-11-19 ENCOUNTER — Telehealth: Payer: Self-pay

## 2017-11-19 LAB — URINE CULTURE: CULTURE: NO GROWTH

## 2017-11-19 NOTE — Telephone Encounter (Signed)
[name] called and confirmed PAT appt. Oncology Nurse Navigator Documentation  Navigator Location: [facility] (11/19/17 1436)   )Navigator Encounter Type: Telephone (11/19/17 1436)                                                    Time Spent with Patient: 15 (11/19/17 1436)

## 2017-11-19 NOTE — Telephone Encounter (Signed)
Voicemail left with PAT details for phone appointment on 1/30 between 9-1p. Instructed to call back and confirm receipt of message. Oncology Nurse Navigator Documentation  Navigator Location: [facility] (11/19/17 1400)   )Navigator Encounter Type: Telephone (11/19/17 1400)                                                    Time Spent with Patient: 15 (11/19/17 1400)

## 2017-11-23 DIAGNOSIS — M47816 Spondylosis without myelopathy or radiculopathy, lumbar region: Secondary | ICD-10-CM | POA: Diagnosis not present

## 2017-11-23 DIAGNOSIS — M4722 Other spondylosis with radiculopathy, cervical region: Secondary | ICD-10-CM | POA: Diagnosis not present

## 2017-11-24 ENCOUNTER — Other Ambulatory Visit: Payer: Self-pay

## 2017-11-24 ENCOUNTER — Encounter
Admission: RE | Admit: 2017-11-24 | Discharge: 2017-11-24 | Disposition: A | Discharge status: Home / Self Care | Payer: Medicare HMO | Source: Ambulatory Visit | Attending: Obstetrics and Gynecology | Admitting: Obstetrics and Gynecology

## 2017-11-24 NOTE — Patient Instructions (Addendum)
Your procedure is scheduled on: 12-01-17 [facility] Report to Same Day Surgery 2nd floor medical mall [facility] Entrance-take elevator on left to 2nd floor.  Check in with surgery information desk.) To find out your arrival time please call [phone] between 1PM - 3PM on 11-30-17 TUESDAY  Remember: Instructions that are not followed completely may result in serious medical risk, up to and including death, or upon the discretion of your surgeon and anesthesiologist your surgery may need to be rescheduled.    _x___ 1. Do not eat food after midnight the night before your procedure. NO GUM OR CANDY AFTER MIDNIGHT.  You may drink clear liquids up to 2 hours before you are scheduled to arrive at the hospital for your procedure.  Do not drink clear liquids within 2 hours of your scheduled arrival to the hospital.  Clear liquids include  --Water or Apple juice without pulp  --Clear carbohydrate beverage such as ClearFast or Gatorade  --Black Coffee or Clear Tea (No milk, no creamers, do not add anything to the coffee or Tea     __x__ 2. No Alcohol for 24 hours before or after surgery.   __x__3. No Smoking for 24 prior to surgery.   ____  4. Bring all medications with you on the day of surgery if instructed.    __x__ 5. Notify your doctor if there is any change in your medical condition     (cold, fever, infections).     Do not wear jewelry, make-up, hairpins, clips or nail polish.  Do not wear lotions, powders, or perfumes. You may wear deodorant.  Do not shave 48 hours prior to surgery. Men may shave face and neck.  Do not bring valuables to the hospital.    [facility] is not responsible for any belongings or valuables.               Contacts, dentures or bridgework may not be worn into surgery.  Leave your suitcase in the car. After surgery it may be brought to your room.  For patients admitted to the hospital, discharge time is determined by your treatment team.   Patients  discharged the day of surgery will not be allowed to drive home.  You will need someone to drive you home and stay with you the night of your procedure.    Please read over the following fact sheets that you were given:   [facility] Preparing for Surgery and or MRSA Information   _x___ TAKE THE FOLLOWING MEDICATION THE MORNING OF SURGERY WITH A SMALL SIP OF WATER. These include:  1. OXYCODONE  2. MAGNESIUM  3. LAMICTAL  4.  5.  6.  ____Fleets enema or Magnesium Citrate as directed.   _x___ Use CHG Soap or sage wipes as directed on instruction sheet   ____ Use inhalers on the day of surgery and bring to hospital day of surgery  ____ Stop Metformin and Janumet 2 days prior to surgery.    ____ Take 1/2 of usual insulin dose the night before surgery and none on the morning surgery.   _x___ Follow recommendations from Cardiologist, Pulmonologist or PCP regarding stopping Aspirin, Coumadin, Plavix ,Eliquis, Effient, or Pradaxa, and Pletal-STOP ASPIRIN NOW  X____Stop Anti-inflammatories such as Advil, Aleve, Ibuprofen, Motrin, Naproxen, Naprosyn, Goodies powders, BC POWDERS or aspirin products NOW-OK to take Tylenol    _x___ Stop supplements until after surgery-STOP VITAMIN C, MILK THISTLE, 5- HYDROXYTRYPTOPHAN AND CRANBERRY NOW-MAY RESUME AFTER SURGERY   ____ Bring C-Pap  to the hospital.

## 2017-11-25 ENCOUNTER — Inpatient Hospital Stay: Admission: RE | Admit: 2017-11-25 | Payer: Self-pay | Source: Ambulatory Visit

## 2017-11-26 ENCOUNTER — Encounter
Admission: RE | Admit: 2017-11-26 | Discharge: 2017-11-26 | Disposition: A | Discharge status: Home / Self Care | Payer: Medicare HMO | Source: Ambulatory Visit | Attending: Obstetrics and Gynecology | Admitting: Obstetrics and Gynecology

## 2017-11-26 ENCOUNTER — Ambulatory Visit
Admission: RE | Admit: 2017-11-26 | Discharge: 2017-11-26 | Disposition: A | Discharge status: Home / Self Care | Payer: Medicare HMO | Source: Ambulatory Visit | Attending: Obstetrics and Gynecology | Admitting: Obstetrics and Gynecology

## 2017-11-26 ENCOUNTER — Inpatient Hospital Stay: Admission: RE | Admit: 2017-11-26 | Payer: Self-pay | Source: Ambulatory Visit

## 2017-11-26 DIAGNOSIS — Z01812 Encounter for preprocedural laboratory examination: Secondary | ICD-10-CM | POA: Diagnosis not present

## 2017-11-26 DIAGNOSIS — C541 Malignant neoplasm of endometrium: Secondary | ICD-10-CM

## 2017-11-26 DIAGNOSIS — Z01818 Encounter for other preprocedural examination: Secondary | ICD-10-CM | POA: Diagnosis not present

## 2017-11-26 DIAGNOSIS — Z01810 Encounter for preprocedural cardiovascular examination: Secondary | ICD-10-CM | POA: Insufficient documentation

## 2017-11-26 LAB — TYPE AND SCREEN
ABO/RH(D): A POS
Antibody Screen: NEGATIVE

## 2017-11-29 ENCOUNTER — Inpatient Hospital Stay: Admission: RE | Admit: 2017-11-29 | Payer: Self-pay | Source: Ambulatory Visit

## 2017-11-29 ENCOUNTER — Telehealth: Payer: Self-pay

## 2017-11-29 NOTE — Telephone Encounter (Signed)
Notified of CXR results.CLINICAL DATA:  Preoperative examination.   EXAM: CHEST  2 VIEW   COMPARISON:  PA and lateral chest 09/11/2016.   FINDINGS: The lungs are clear. Heart size is normal. No pneumothorax or pleural effusion. No focal bony abnormality. The patient is status post cervical fusion. Mild convex right scoliosis noted.   IMPRESSION: No acute disease.     Electronically Signed   By: Inge Rise M.D.   On: 11/26/2017 16:04        Oncology Nurse Navigator Documentation  Navigator Location: [facility] (11/29/17 1300)   )Navigator Encounter Type: Telephone (11/29/17 1300) Telephone: Outgoing Call;Diagnostic Results (11/29/17 1300)                                                  Time Spent with Patient: 15 (11/29/17 1300)

## 2017-11-30 MED ORDER — CEFAZOLIN SODIUM-DEXTROSE 2-4 GM/100ML-% IV SOLN: 2.0000 g | INTRAVENOUS | Status: AC

## 2017-11-30 NOTE — Pre-Procedure Instructions (Signed)
Halimah Bewick Seto  ECHO COMPLETE WO IMAGING ENHANCING AGENT  Order# [id number]  Reading physician: Skeet Latch, MD Ordering physician: Rise Patience, MD Study date: 11/21/15  Study Result   Result status: Final result                              *[facility]*                         [address], [place] [address]                            [phone]  ------------------------------------------------------------------- Transthoracic Echocardiography  Patient:    [name], [name] MR #:       [id number] Study Date: 11/21/2015 Gender:     F Age:        52 Height:     154.9 cm Weight:     59.9 kg BSA:        1.62 m^2 Pt. Status: Room:       [place], [place], Arshad N  REFERRING    [place], [employer], [place]  SONOGRAPHER  Jimmy Reel, RDCS  PERFORMING   [facility], Inpatient  cc:  ------------------------------------------------------------------- LV EF: 55% -   60%  ------------------------------------------------------------------- Indications:      Syncope 780.2.  ------------------------------------------------------------------- History:   PMH:  ETOH. No prior cardiac history.  ------------------------------------------------------------------- Study Conclusions  - Left ventricle: The cavity size was normal. Systolic function was   normal. The estimated ejection fraction was in the range of 55%   to 60%. Wall motion was normal; there were no regional wall   motion abnormalities. Left ventricular diastolic function   parameters were normal. - Aortic valve: There was moderate regurgitation. - Mitral valve: Transvalvular velocity was within the normal range.   There was no evidence for stenosis. There was mild regurgitation. - Right ventricle: The cavity size was normal. Wall  thickness was   normal. Systolic function was normal. - Atrial septum: No defect or patent foramen ovale was identified   by color flow Doppler. - Tricuspid valve: There was mild regurgitation. - Pulmonary arteries: Systolic pressure was within the normal   range. PA peak pressure: 21 mm Hg (S). - Inferior vena cava: The vessel was normal in size. The   respirophasic diameter changes were in the normal range (>= 50%),   consistent with normal central venous pressure.  Transthoracic echocardiography.  M-mode, complete 2D, spectral Doppler, and color Doppler.  Birthdate:  Patient birthdate: 1966-04-10.  Age:  Patient is 52 yr old old.  Sex:  Gender: female. BMI: 25 kg/m^2.  Blood pressure:     116/69  Patient status: Inpatient.  Study date:  Study date: 11/21/2015. Study time: 01:22 PM.  Location:  Bedside.  -------------------------------------------------------------------  ------------------------------------------------------------------- Left ventricle:  The cavity size was normal. Systolic function was normal. The estimated ejection fraction was in the range of 55% to 60%. Wall motion was normal; there were no regional wall motion  abnormalities. The transmitral flow pattern was normal. The deceleration time of the early transmitral flow velocity was normal. The pulmonary vein flow pattern was normal. The tissue Doppler parameters were normal. Left ventricular diastolic function parameters were normal.  ------------------------------------------------------------------- Aortic valve:   Trileaflet; normal thickness leaflets. Mobility was not restricted.  Doppler:  Transvalvular velocity was within the normal range. There was no stenosis. There was moderate regurgitation.  ------------------------------------------------------------------- Aorta:  Aortic root: The aortic root was normal in size.  ------------------------------------------------------------------- Mitral  valve:   Structurally normal valve.   Mobility was not restricted.  Doppler:  Transvalvular velocity was within the normal range. There was no evidence for stenosis. There was mild regurgitation.    Peak gradient (D): 2 mm Hg.  ------------------------------------------------------------------- Left atrium:  The atrium was normal in size.  ------------------------------------------------------------------- Atrial septum:  No defect or patent foramen ovale was identified by color flow Doppler.  ------------------------------------------------------------------- Right ventricle:  The cavity size was normal. Wall thickness was normal. Systolic function was normal.  ------------------------------------------------------------------- Pulmonic valve:    Structurally normal valve.   Cusp separation was normal.  Doppler:  Transvalvular velocity was within the normal range. There was no evidence for stenosis. There was no regurgitation.  ------------------------------------------------------------------- Tricuspid valve:   Structurally normal valve.    Doppler: Transvalvular velocity was within the normal range. There was mild regurgitation.  ------------------------------------------------------------------- Pulmonary artery:   The main pulmonary artery was normal-sized. Systolic pressure was within the normal range.  ------------------------------------------------------------------- Right atrium:  The atrium was normal in size.  ------------------------------------------------------------------- Pericardium:  There was no pericardial effusion.  ------------------------------------------------------------------- Systemic veins: Inferior vena cava: The vessel was normal in size. The respirophasic diameter changes were in the normal range (>= 50%), consistent with normal central venous  pressure.  ------------------------------------------------------------------- Measurements   Left ventricle                           Value         Reference  LV ID, ED, PLAX chordal          (L)     35.7   mm     43 - 52  LV ID, ES, PLAX chordal                  25.5   mm     23 - 38  LV fx shortening, PLAX chordal           29     %      >=29  LV PW thickness, ED                      8.67   mm     ---------  IVS/LV PW ratio, ED                      1.07          <=1.3  LV ejection fraction, 1-p A4C            58     %      ---------  LV end-diastolic volume, 2-p             58     ml     ---------  LV end-systolic volume, 2-p              24     ml     ---------  LV  ejection fraction, 2-p                58     %      ---------  Stroke volume, 2-p                       34     ml     ---------  LV end-diastolic volume/bsa, 2-p         36     ml/m^2 ---------  LV end-systolic volume/bsa, 2-p          15     ml/m^2 ---------  Stroke volume/bsa, 2-p                   21     ml/m^2 ---------  LV e&', lateral                           11.6   cm/s   ---------  LV E/e&', lateral                         6.23          ---------  LV s&', lateral                           7.4    cm/s   ---------  LV e&', medial                            10.3   cm/s   ---------  LV E/e&', medial                          7.02          ---------  LV e&', average                           10.95  cm/s   ---------  LV E/e&', average                         6.6           ---------    Ventricular septum                       Value         Reference  IVS thickness, ED                        9.25   mm     ---------    LVOT                                     Value         Reference  LVOT ID, S                               20     mm     ---------  LVOT area  3.14   cm^2   ---------    Aortic valve                             Value         Reference  Aortic regurg peak velocity               155.36 cm/s   ---------  Aortic regurg deceleration               111    cm/s^2 ---------  Aortic regurg deceleration time          1403   ms     ---------  Aortic regurg pressure half-time         407    ms     ---------  Aortic regurg peak gradient              10     mm Hg  ---------    Aorta                                    Value         Reference  Aortic root ID, ED                       29     mm     ---------    Left atrium                              Value         Reference  LA ID, A-P, ES                           25     mm     ---------  LA ID/bsa, A-P                           1.54   cm/m^2 <=2.2  LA volume, S                             44.9   ml     ---------  LA volume/bsa, S                         27.7   ml/m^2 ---------  LA volume, ES, 1-p A4C                   38     ml     ---------  LA volume/bsa, ES, 1-p A4C               23.5   ml/m^2 ---------  LA volume, ES, 1-p A2C                   46.9   ml     ---------  LA volume/bsa, ES, 1-p A2C               29     ml/m^2 ---------    Mitral valve  Value         Reference  Mitral E-wave peak velocity              72.3   cm/s   ---------  Mitral A-wave peak velocity              53.8   cm/s   ---------  Mitral deceleration time                 190    ms     150 - 230  Mitral peak gradient, D                  2      mm Hg  ---------  Mitral E/A ratio, peak                   1.3           ---------    Pulmonary arteries                       Value         Reference  PA pressure, S, DP                       21     mm Hg  <=30    Tricuspid valve                          Value         Reference  Tricuspid regurg peak velocity           212    cm/s   ---------  Tricuspid peak RV-RA gradient            18     mm Hg  ---------    Systemic veins                           Value         Reference  Estimated CVP                            3      mm Hg  ---------    Right ventricle                           Value         Reference  TAPSE                                    25.2   mm     ---------  RV pressure, S, DP                       21     mm Hg  <=30  RV s&', lateral, S                        13.4   cm/s   ---------  Legend: (L)  and  (H)  mark values outside specified reference range.  ------------------------------------------------------------------- Prepared and Electronically Authenticated by  Skeet Latch, MD 2017-01-26T17:38:27  [facility] Images   Show images for Echocardiogram  Patient Information   Patient  Name [name], [name] Sex Female DOB [birth year] SSN [id number]  Reason For Exam  Priority: Routine  Syncope 780.2 / R55  Surgical History   Surgical History   No past medical history on file.    Other Surgical History   Procedure Laterality Date Comment Source  CERVICAL SPINE SURGERY   x 3 2001,2005,2007 c5-6 Provider  CESAREAN SECTION  2003  Provider  HYSTEROSCOPY N/A 10/11/2017 Procedure: HYSTEROSCOPY; Surgeon: Harlin Heys, MD; Location: [facility] ORS; Service: Gynecology; Laterality: N/A; Provider  HYSTEROSCOPY W/D&C N/A 10/11/2017 Procedure: DILATATION AND CURETTAGE /HYSTEROSCOPY; Surgeon: Harlin Heys, MD; Location: [facility] ORS; Service: Gynecology; Laterality: N/A; Provider  TONSILLECTOMY    Provider  TUBAL LIGATION    Provider    Performing Technologist/Nurse   Performing Technologist/Nurse: Joelene Millin                    Implants     No active implants to display in this view.  Order-Level Documents - 11/19/2015:   Scan on 11/24/2015 9:25 AM by Default, Provider, MD      Encounter-Level Documents - 11/19/2015:   Scan on 11/24/2015 1:24 PM by Default, Provider, MD  Scan on 11/21/2015 12:12 PM by Default, Provider, MD  Scan on 11/22/2015 9:06 AM by Default, Provider, MD  Electronic signature on 11/19/2015 8:52 PM  Electronic signature on 11/19/2015 8:51 PM      Signed   Electronically signed by  Skeet Latch, MD on 11/21/15 at 35 EST  Printable Result Report   Result Report   External Result Report   External Result Report

## 2017-12-01 ENCOUNTER — Encounter: Payer: Self-pay | Admitting: *Deleted

## 2017-12-01 ENCOUNTER — Encounter
Admission: RE | Disposition: A | Discharge status: Home / Self Care | Payer: Self-pay | Source: Ambulatory Visit | Attending: Obstetrics and Gynecology

## 2017-12-01 ENCOUNTER — Ambulatory Visit: Payer: Medicare HMO

## 2017-12-01 ENCOUNTER — Ambulatory Visit
Admission: RE | Admit: 2017-12-01 | Discharge: 2017-12-01 | Disposition: A | Discharge status: Home / Self Care | Payer: Medicare HMO | Source: Ambulatory Visit | Attending: Obstetrics and Gynecology | Admitting: Obstetrics and Gynecology

## 2017-12-01 DIAGNOSIS — E039 Hypothyroidism, unspecified: Secondary | ICD-10-CM | POA: Insufficient documentation

## 2017-12-01 DIAGNOSIS — Z79899 Other long term (current) drug therapy: Secondary | ICD-10-CM | POA: Insufficient documentation

## 2017-12-01 DIAGNOSIS — F319 Bipolar disorder, unspecified: Secondary | ICD-10-CM | POA: Insufficient documentation

## 2017-12-01 DIAGNOSIS — F419 Anxiety disorder, unspecified: Secondary | ICD-10-CM | POA: Diagnosis not present

## 2017-12-01 DIAGNOSIS — B009 Herpesviral infection, unspecified: Secondary | ICD-10-CM | POA: Insufficient documentation

## 2017-12-01 DIAGNOSIS — F901 Attention-deficit hyperactivity disorder, predominantly hyperactive type: Secondary | ICD-10-CM | POA: Insufficient documentation

## 2017-12-01 DIAGNOSIS — Z7982 Long term (current) use of aspirin: Secondary | ICD-10-CM | POA: Diagnosis not present

## 2017-12-01 DIAGNOSIS — K219 Gastro-esophageal reflux disease without esophagitis: Secondary | ICD-10-CM | POA: Insufficient documentation

## 2017-12-01 DIAGNOSIS — M1990 Unspecified osteoarthritis, unspecified site: Secondary | ICD-10-CM | POA: Diagnosis not present

## 2017-12-01 DIAGNOSIS — N83201 Unspecified ovarian cyst, right side: Secondary | ICD-10-CM | POA: Diagnosis not present

## 2017-12-01 DIAGNOSIS — R569 Unspecified convulsions: Secondary | ICD-10-CM | POA: Diagnosis not present

## 2017-12-01 DIAGNOSIS — Z888 Allergy status to other drugs, medicaments and biological substances status: Secondary | ICD-10-CM | POA: Diagnosis not present

## 2017-12-01 DIAGNOSIS — Z538 Procedure and treatment not carried out for other reasons: Secondary | ICD-10-CM | POA: Diagnosis not present

## 2017-12-01 DIAGNOSIS — N8500 Endometrial hyperplasia, unspecified: Secondary | ICD-10-CM

## 2017-12-01 DIAGNOSIS — N8502 Endometrial intraepithelial neoplasia [EIN]: Secondary | ICD-10-CM | POA: Diagnosis not present

## 2017-12-01 LAB — URINE DRUG SCREEN, QUALITATIVE (ARMC ONLY)
Amphetamines, Ur Screen: NOT DETECTED
Barbiturates, Ur Screen: NOT DETECTED
Benzodiazepine, Ur Scrn: NOT DETECTED
CANNABINOID 50 NG, UR ~~LOC~~: NOT DETECTED
COCAINE METABOLITE, UR ~~LOC~~: POSITIVE — AB
MDMA (ECSTASY) UR SCREEN: NOT DETECTED
METHADONE SCREEN, URINE: NOT DETECTED
Opiate, Ur Screen: POSITIVE — AB
Phencyclidine (PCP) Ur S: NOT DETECTED
TRICYCLIC, UR SCREEN: NOT DETECTED

## 2017-12-01 SURGERY — HYSTERECTOMY, TOTAL, LAPAROSCOPIC
Anesthesia: General

## 2017-12-01 MED ORDER — FAMOTIDINE 20 MG PO TABS
ORAL_TABLET | ORAL | Status: AC
  Administered 2017-12-01: 20 mg via ORAL
  Filled 2017-12-01: qty 1

## 2017-12-01 MED ORDER — CEFAZOLIN SODIUM-DEXTROSE 2-4 GM/100ML-% IV SOLN
INTRAVENOUS | Status: AC
  Filled 2017-12-01: qty 100

## 2017-12-01 MED ORDER — MIDAZOLAM HCL 2 MG/2ML IJ SOLN
INTRAMUSCULAR | Status: AC
  Filled 2017-12-01: qty 2

## 2017-12-01 MED ORDER — FAMOTIDINE 20 MG PO TABS
20.0000 mg | ORAL_TABLET | Freq: Once | ORAL | Status: AC
  Administered 2017-12-01: 20 mg via ORAL

## 2017-12-01 MED ORDER — FENTANYL CITRATE (PF) 250 MCG/5ML IJ SOLN
INTRAMUSCULAR | Status: AC
  Filled 2017-12-01: qty 5

## 2017-12-01 MED ORDER — LACTATED RINGERS IV SOLN
INTRAVENOUS | Status: DC
  Administered 2017-12-01: 11:00:00 via INTRAVENOUS

## 2017-12-01 MED ORDER — PROPOFOL 10 MG/ML IV BOLUS
INTRAVENOUS | Status: AC
  Filled 2017-12-01: qty 20

## 2017-12-01 MED ORDER — SOD CITRATE-CITRIC ACID 500-334 MG/5ML PO SOLN
30.0000 mL | ORAL | Status: AC
  Filled 2017-12-01 (×2): qty 30

## 2017-12-01 SURGICAL SUPPLY — 55 items
APPLICATOR SURGIFLO ENDO (HEMOSTASIS) IMPLANT
BAG URINE DRAINAGE (UROLOGICAL SUPPLIES) ×4 IMPLANT
BLADE SURG 15 STRL LF DISP TIS (BLADE) ×2 IMPLANT
BLADE SURG 15 STRL SS (BLADE) ×2
CANISTER SUCT 1200ML W/VALVE (MISCELLANEOUS) ×4 IMPLANT
CANNULA DILATOR  5MM W/SLV (CANNULA) ×1
CANNULA DILATOR 5 W/SLV (CANNULA) ×3 IMPLANT
CATH FOLEY 2WAY  5CC 16FR (CATHETERS) ×2
CATH URTH 16FR FL 2W BLN LF (CATHETERS) ×2 IMPLANT
CHLORAPREP W/TINT 26ML (MISCELLANEOUS) ×4 IMPLANT
CORD MONOPOLAR M/FML 12FT (MISCELLANEOUS) ×4 IMPLANT
DEFOGGER SCOPE WARMER CLEARIFY (MISCELLANEOUS) ×4 IMPLANT
DERMABOND ADVANCED (GAUZE/BANDAGES/DRESSINGS) ×2
DERMABOND ADVANCED .7 DNX12 (GAUZE/BANDAGES/DRESSINGS) ×2 IMPLANT
DEVICE SUTURE ENDOST 10MM (ENDOMECHANICALS) ×4 IMPLANT
DRAPE STERI POUCH LG 24X46 STR (DRAPES) ×8 IMPLANT
GLOVE BIO SURGEON STRL SZ8 (GLOVE) ×16 IMPLANT
GLOVE INDICATOR 8.0 STRL GRN (GLOVE) ×4 IMPLANT
GOWN STRL REUS W/ TWL LRG LVL3 (GOWN DISPOSABLE) ×4 IMPLANT
GOWN STRL REUS W/ TWL XL LVL3 (GOWN DISPOSABLE) ×2 IMPLANT
GOWN STRL REUS W/TWL LRG LVL3 (GOWN DISPOSABLE) ×4
GOWN STRL REUS W/TWL XL LVL3 (GOWN DISPOSABLE) ×2
GRASPER ENDO ROTC 5X36 (INSTRUMENTS) IMPLANT
IRRIGATION STRYKERFLOW (MISCELLANEOUS) IMPLANT
IRRIGATOR STRYKERFLOW (MISCELLANEOUS)
IV LACTATED RINGERS 1000ML (IV SOLUTION) ×4 IMPLANT
KIT PINK PAD W/HEAD ARE REST (MISCELLANEOUS) ×4
KIT PINK PAD W/HEAD ARM REST (MISCELLANEOUS) ×2 IMPLANT
LABEL OR SOLS (LABEL) ×4 IMPLANT
LIGASURE VESSEL 5MM BLUNT TIP (ELECTROSURGICAL) ×4 IMPLANT
MANIPULATOR VCARE LG CRV RETR (MISCELLANEOUS) IMPLANT
MANIPULATOR VCARE SML CRV RETR (MISCELLANEOUS) IMPLANT
MANIPULATOR VCARE STD CRV RETR (MISCELLANEOUS) IMPLANT
NDL INSUFF ACCESS 14 VERSASTEP (NEEDLE) ×4 IMPLANT
NEEDLE SPNL 22GX5 LNG QUINC BK (NEEDLE) ×4 IMPLANT
NS IRRIG 500ML POUR BTL (IV SOLUTION) ×4 IMPLANT
OCCLUDER COLPOPNEUMO (BALLOONS) ×4 IMPLANT
PACK GYN LAPAROSCOPIC (MISCELLANEOUS) ×4 IMPLANT
PAD OB MATERNITY 4.3X12.25 (PERSONAL CARE ITEMS) ×4 IMPLANT
PAD PREP 24X41 OB/GYN DISP (PERSONAL CARE ITEMS) ×4 IMPLANT
POUCH ENDO CATCH II 15MM (MISCELLANEOUS) IMPLANT
SET CYSTO W/LG BORE CLAMP LF (SET/KITS/TRAYS/PACK) ×4 IMPLANT
SHEARS ENDO 5MM 31CM (CUTTER) ×4 IMPLANT
SPOGE SURGIFLO 8M (HEMOSTASIS)
SPONGE LAP 4X18 5PK (MISCELLANEOUS) IMPLANT
SPONGE SURGIFLO 8M (HEMOSTASIS) IMPLANT
SUT ENDO VLOC 180-0-8IN (SUTURE) ×4 IMPLANT
SUT VIC AB 2-0 UR6 27 (SUTURE) ×4 IMPLANT
SYR 10ML LL (SYRINGE) ×4 IMPLANT
SYR 3ML LL SCALE MARK (SYRINGE) ×8 IMPLANT
SYR 50ML LL SCALE MARK (SYRINGE) ×8 IMPLANT
TROCAR BLUNT TIP 12MM OMST12BT (TROCAR) ×4 IMPLANT
TROCAR VERSASTEP PLUS 12MM (TROCAR) ×4 IMPLANT
TROCAR VERSASTEP PLUS 5MM (TROCAR) ×8 IMPLANT
TUBING INSUF HEATED (TUBING) ×4 IMPLANT

## 2017-12-01 NOTE — Anesthesia Preprocedure Evaluation (Deleted)
Anesthesia Evaluation  Patient identified by MRN, date of birth, ID band Patient awake    Reviewed: Allergy & Precautions, NPO status , Patient's Chart, lab work & pertinent test results  History of Anesthesia Complications Negative for: history of anesthetic complications  Airway Mallampati: II  TM Distance: >3 FB Neck ROM: Full    Dental  (+) Upper Dentures   Pulmonary neg pulmonary ROS, neg sleep apnea, neg COPD,    breath sounds clear to auscultation- rhonchi (-) wheezing      Cardiovascular Exercise Tolerance: Good (-) hypertension(-) CAD, (-) Past MI, (-) Cardiac Stents and (-) CABG  Rhythm:Regular Rate:Normal - Systolic murmurs and - Diastolic murmurs    Neuro/Psych Seizures -, Well Controlled,  PSYCHIATRIC DISORDERS Anxiety Depression Bipolar Disorder    GI/Hepatic Neg liver ROS, GERD  ,  Endo/Other  neg diabetesHypothyroidism   Renal/GU negative Renal ROS     Musculoskeletal  (+) Arthritis ,   Abdominal (+) - obese,   Peds  Hematology negative hematology ROS (+)   Anesthesia Other Findings Past Medical History: No date: Abnormal uterine bleeding No date: ADHD (attention deficit hyperactivity disorder) No date: Allergy No date: Anxiety No date: Bipolar 1 disorder (HCC) No date: Bipolar disorder ([place]) No date: DDD (degenerative disc disease) No date: Depression No date: HSV infection No date: Hx: UTI (urinary tract infection) No date: Pertussis No date: Seizures ([place])     Comment:  last seizure 12/2014  No date: Thyroid disease   Reproductive/Obstetrics                             Anesthesia Physical Anesthesia Plan  ASA: II  Anesthesia Plan: General   Post-op Pain Management:    Induction: Intravenous  PONV Risk Score and Plan: 2 and Dexamethasone, Ondansetron and Midazolam  Airway Management Planned: Oral ETT  Additional Equipment:   Intra-op Plan:    Post-operative Plan: Extubation in OR  Informed Consent: I have reviewed the patients History and Physical, chart, labs and discussed the procedure including the risks, benefits and alternatives for the proposed anesthesia with the patient or authorized representative who has indicated his/her understanding and acceptance.   Dental advisory given  Plan Discussed with: CRNA and Anesthesiologist  Anesthesia Plan Comments:         Anesthesia Quick Evaluation

## 2017-12-01 NOTE — Progress Notes (Signed)
Pt canceled per anesthesia d/t positive urine drug screen

## 2017-12-01 NOTE — Interval H&P Note (Signed)
History and Physical Interval Note:  12/01/2017 11:04 AM  [name]  has presented today for surgery, with the diagnosis of ATYPICAL GLANDULAR PROLIFERATION  The various methods of treatment have been discussed with the patient and family. After consideration of risks, benefits and other options for treatment, the patient has consented to  Procedure(s): HYSTERECTOMY TOTAL LAPAROSCOPIC BILATERAL SALPINGO OOPHORECTOMY (Bilateral) SENTINEL NODE BIOPSY,INJECTION,MAPPING (N/A) as a surgical intervention .  The patient's history has been reviewed, patient examined, no change in status, stable for surgery.  I have reviewed the patient's chart and labs.  Questions were answered to the patient's satisfaction.    [name]

## 2017-12-03 ENCOUNTER — Telehealth: Payer: Self-pay

## 2017-12-03 NOTE — Telephone Encounter (Signed)
Called and notified of PAT appointment 2/13 between 1-5p, Phone interview. Instructed to follow previous surgery instructions that she was given. No further questions at this time. Oncology Nurse Navigator Documentation  Navigator Location: [facility] (12/03/17 1300)   )Navigator Encounter Type: Telephone (12/03/17 1300) Telephone: Lahoma Crocker Call;Appt Confirmation/Clarification (12/03/17 1300)                                                  Time Spent with Patient: 15 (12/03/17 1300)

## 2017-12-08 ENCOUNTER — Inpatient Hospital Stay: Admission: RE | Admit: 2017-12-08 | Payer: Self-pay | Source: Ambulatory Visit

## 2017-12-08 MED ORDER — PROMETHAZINE HCL 25 MG/ML IJ SOLN
6.2500 mg | INTRAMUSCULAR | Status: DC | PRN
  Administered 2017-12-15: 6.25 mg via INTRAVENOUS

## 2017-12-08 MED ORDER — MEPERIDINE HCL 50 MG/ML IJ SOLN: 6.2500 mg | INTRAMUSCULAR | Status: DC | PRN

## 2017-12-08 MED ORDER — FENTANYL CITRATE (PF) 100 MCG/2ML IJ SOLN
25.0000 ug | INTRAMUSCULAR | Status: AC | PRN
  Administered 2017-12-15 (×2): 25 ug via INTRAVENOUS
  Administered 2017-12-15: 50 ug via INTRAVENOUS
  Administered 2017-12-15 (×2): 25 ug via INTRAVENOUS
  Administered 2017-12-15: 50 ug via INTRAVENOUS

## 2017-12-08 MED ORDER — OXYCODONE HCL 5 MG PO TABS: 5.0000 mg | ORAL_TABLET | Freq: Once | ORAL | Status: DC | PRN

## 2017-12-08 MED ORDER — OXYCODONE HCL 5 MG/5ML PO SOLN: 5.0000 mg | Freq: Once | ORAL | Status: DC | PRN

## 2017-12-09 ENCOUNTER — Telehealth: Payer: Self-pay

## 2017-12-09 NOTE — Telephone Encounter (Signed)
Spoke with [place] in [employer]. [name] does not need any further PAT for upcoming surgery. Voicemail left with [name] regarding. Oncology Nurse Navigator Documentation  Navigator Location: [facility] (12/09/17 1600)   )Navigator Encounter Type: Telephone (12/09/17 1600)                                                    Time Spent with Patient: 15 (12/09/17 1600)

## 2017-12-09 NOTE — Telephone Encounter (Signed)
Received call from Ms. Malter that she did not receive her PAT as scheduled for 2/13. It appears it was cancelled in the EHR. I have left a voicemail with scheduling regarding. I notified Ms. Valenti that as soon as I hear back from them I will call her. Oncology Nurse Navigator Documentation  Navigator Location: [facility] (12/09/17 1500)   )Navigator Encounter Type: Telephone (12/09/17 1500)                                                    Time Spent with Patient: 15 (12/09/17 1500)

## 2017-12-10 ENCOUNTER — Other Ambulatory Visit: Payer: Self-pay | Admitting: Nurse Practitioner

## 2017-12-10 ENCOUNTER — Telehealth: Payer: Self-pay

## 2017-12-10 DIAGNOSIS — F4310 Post-traumatic stress disorder, unspecified: Secondary | ICD-10-CM | POA: Diagnosis not present

## 2017-12-10 DIAGNOSIS — F3181 Bipolar II disorder: Secondary | ICD-10-CM | POA: Diagnosis not present

## 2017-12-10 NOTE — Progress Notes (Signed)
Patient called [facility] [place] requesting pain medication. States unlieved by Tylenol and requests Oxycodone which she has had in the past. Chart review reveals that this clinic has not written for pain medication for this patient. Patient's planned surgery with Dr. Fransisca Connors on 12/01/17 was delayed due to presence of cocaine in her system prior to surgery.   [place] Controlled Substance Reporting System reviewed which revealed that patient had filled Oxycodone 10mg  tablets on 11/27/17 (see below) prescribed by Simeon Craft, PA at [facility].   Declined prescription and/or refill at this time. Triage nurse notified patient via phone who reportedly replied, 'that's fine'. Will also reach out to Simeon Craft, PA of patient's duplicate request.   [place] reviewed:     Beckey Rutter, [place], AGNP-C [place] at [facility] [phone] (office) 12/10/17 2:58 PM

## 2017-12-10 NOTE — Telephone Encounter (Signed)
Received call from from [name] with request for pain medication. She states she is currently only taking Tylenol. She has taken oxycodone in the past, 09/2017. Describes her pain as intermittent that she has had for quite some time. Over the last several days it has become more constant. "Abdominal pain that is a dull ache that is sharp at times and shoots to my bladder" States she cannot tolerate the pain. Scheduled for surgery on 2/20. Instructed that Dr. Bradd Canary are not here and I will send this message to out NP, Lauren and triage nurse. Notified that she may want to see her in the clinic before prescribing her pain medication. Oncology Nurse Navigator Documentation  Navigator Location: [facility] (12/10/17 1000)   )Navigator Encounter Type: Telephone (12/10/17 1000) Telephone: Incoming Call;Symptom Mgt (12/10/17 1000)                                                  Time Spent with Patient: 15 (12/10/17 1000)

## 2017-12-13 NOTE — Addendum Note (Signed)
Addended by: Mellody Drown on: 12/13/2017 07:23 PM   Modules accepted: Orders, SmartSet

## 2017-12-14 MED ORDER — CEFAZOLIN SODIUM-DEXTROSE 2-4 GM/100ML-% IV SOLN
2.0000 g | INTRAVENOUS | Status: AC
  Administered 2017-12-15: 2 g via INTRAVENOUS

## 2017-12-14 NOTE — Pre-Procedure Instructions (Signed)
SHERRY RN CALLED DR Lytton OFFICE YESTERDAY TO INFORM THEM THAT ORDERS WERE NOT IN Epic. I CAME IN THIS MORNING AND ORDERS HAVE BEEN PLACED AND DR [place]oka HAS ADDED AN ENEMA TO BE DONE PRIOR TO SURGERY.  CALLED PT AND INFORMED HER TO DO FLEET ENEMA 1 HOUR PRIOR TO ARRIVAL TIME TO HOSPITAL.  PT STATES SHE STILL HAS INSTRUCTIONS FROM HER LAST SURGERY AND KNOWS WHAT TO TAKE AM OF SURGERY

## 2017-12-15 ENCOUNTER — Encounter: Payer: Self-pay | Admitting: *Deleted

## 2017-12-15 ENCOUNTER — Ambulatory Visit: Payer: Medicare HMO | Admitting: Certified Registered"

## 2017-12-15 ENCOUNTER — Other Ambulatory Visit: Payer: Self-pay

## 2017-12-15 ENCOUNTER — Encounter
Admission: AD | Disposition: A | Discharge status: Home / Self Care | Payer: Self-pay | Source: Ambulatory Visit | Attending: Obstetrics & Gynecology

## 2017-12-15 ENCOUNTER — Inpatient Hospital Stay
Admission: AD | Admit: 2017-12-15 | Discharge: 2017-12-17 | DRG: 740 | Disposition: A | Discharge status: Home / Self Care | Payer: Medicare HMO | Source: Ambulatory Visit | Attending: Obstetrics & Gynecology | Admitting: Obstetrics & Gynecology

## 2017-12-15 DIAGNOSIS — N938 Other specified abnormal uterine and vaginal bleeding: Secondary | ICD-10-CM | POA: Diagnosis not present

## 2017-12-15 DIAGNOSIS — N99820 Postprocedural hemorrhage and hematoma of a genitourinary system organ or structure following a genitourinary system procedure: Secondary | ICD-10-CM | POA: Diagnosis not present

## 2017-12-15 DIAGNOSIS — I700 Atherosclerosis of aorta: Secondary | ICD-10-CM | POA: Diagnosis not present

## 2017-12-15 DIAGNOSIS — Z886 Allergy status to analgesic agent status: Secondary | ICD-10-CM

## 2017-12-15 DIAGNOSIS — C541 Malignant neoplasm of endometrium: Principal | ICD-10-CM | POA: Diagnosis present

## 2017-12-15 DIAGNOSIS — L7632 Postprocedural hematoma of skin and subcutaneous tissue following other procedure: Secondary | ICD-10-CM | POA: Diagnosis not present

## 2017-12-15 DIAGNOSIS — N9989 Other postprocedural complications and disorders of genitourinary system: Secondary | ICD-10-CM | POA: Diagnosis not present

## 2017-12-15 DIAGNOSIS — D62 Acute posthemorrhagic anemia: Secondary | ICD-10-CM | POA: Diagnosis present

## 2017-12-15 DIAGNOSIS — S301XXA Contusion of abdominal wall, initial encounter: Secondary | ICD-10-CM | POA: Diagnosis not present

## 2017-12-15 DIAGNOSIS — N8502 Endometrial intraepithelial neoplasia [EIN]: Secondary | ICD-10-CM | POA: Diagnosis not present

## 2017-12-15 DIAGNOSIS — R87619 Unspecified abnormal cytological findings in specimens from cervix uteri: Secondary | ICD-10-CM | POA: Diagnosis not present

## 2017-12-15 DIAGNOSIS — Z7982 Long term (current) use of aspirin: Secondary | ICD-10-CM

## 2017-12-15 DIAGNOSIS — N800 Endometriosis of uterus: Secondary | ICD-10-CM | POA: Diagnosis not present

## 2017-12-15 DIAGNOSIS — R877 Abnormal histological findings in specimens from female genital organs: Secondary | ICD-10-CM | POA: Diagnosis not present

## 2017-12-15 DIAGNOSIS — N8500 Endometrial hyperplasia, unspecified: Secondary | ICD-10-CM | POA: Diagnosis present

## 2017-12-15 DIAGNOSIS — N838 Other noninflammatory disorders of ovary, fallopian tube and broad ligament: Secondary | ICD-10-CM | POA: Diagnosis not present

## 2017-12-15 DIAGNOSIS — N950 Postmenopausal bleeding: Secondary | ICD-10-CM | POA: Diagnosis not present

## 2017-12-15 DIAGNOSIS — Z90710 Acquired absence of both cervix and uterus: Secondary | ICD-10-CM | POA: Diagnosis present

## 2017-12-15 DIAGNOSIS — R109 Unspecified abdominal pain: Secondary | ICD-10-CM | POA: Diagnosis not present

## 2017-12-15 HISTORY — PX: LAPAROSCOPIC HYSTERECTOMY: SHX1926

## 2017-12-15 HISTORY — PX: SENTINEL NODE BIOPSY: SHX6608

## 2017-12-15 HISTORY — PX: LAPAROSCOPIC BILATERAL SALPINGO OOPHERECTOMY: SHX5890

## 2017-12-15 LAB — BASIC METABOLIC PANEL
Anion gap: 10 (ref 5–15)
BUN: 17 mg/dL (ref 6–20)
CALCIUM: 9.3 mg/dL (ref 8.9–10.3)
CO2: 21 mmol/L — ABNORMAL LOW (ref 22–32)
CREATININE: 0.88 mg/dL (ref 0.44–1.00)
Chloride: 108 mmol/L (ref 101–111)
GFR calc Af Amer: >60 mL/min (ref >60)
GLUCOSE: 88 mg/dL (ref 65–99)
Potassium: 3.9 mmol/L (ref 3.5–5.1)
Sodium: 139 mmol/L (ref 135–145)

## 2017-12-15 LAB — CBC WITH DIFFERENTIAL/PLATELET
BASOS ABS: 0 10*3/uL (ref 0–0.1)
Basophils Relative: 1 %
EOS ABS: 0.1 10*3/uL (ref 0–0.7)
EOS PCT: 1 %
HCT: 42.7 % (ref 35.0–47.0)
Hemoglobin: 14.5 g/dL (ref 12.0–16.0)
LYMPHS PCT: 26 %
Lymphs Abs: 2 10*3/uL (ref 1.0–3.6)
MCH: 30.8 pg (ref 26.0–34.0)
MCHC: 33.9 g/dL (ref 32.0–36.0)
MCV: 90.9 fL (ref 80.0–100.0)
MONO ABS: 0.4 10*3/uL (ref 0.2–0.9)
Monocytes Relative: 6 %
Neutro Abs: 4.9 10*3/uL (ref 1.4–6.5)
Neutrophils Relative %: 66 %
PLATELETS: 266 10*3/uL (ref 150–440)
RBC: 4.69 MIL/uL (ref 3.80–5.20)
RDW: 13.4 % (ref 11.5–14.5)
WBC: 7.4 10*3/uL (ref 3.6–11.0)

## 2017-12-15 LAB — URINE DRUG SCREEN, QUALITATIVE (ARMC ONLY)
Amphetamines, Ur Screen: NOT DETECTED
BARBITURATES, UR SCREEN: NOT DETECTED
Benzodiazepine, Ur Scrn: NOT DETECTED
CANNABINOID 50 NG, UR ~~LOC~~: NOT DETECTED
Cocaine Metabolite,Ur ~~LOC~~: NOT DETECTED
MDMA (Ecstasy)Ur Screen: NOT DETECTED
Methadone Scn, Ur: NOT DETECTED
Opiate, Ur Screen: POSITIVE — AB
Phencyclidine (PCP) Ur S: NOT DETECTED
TRICYCLIC, UR SCREEN: NOT DETECTED

## 2017-12-15 LAB — CBC
HCT: 38.4 % (ref 35.0–47.0)
HEMOGLOBIN: 13.1 g/dL (ref 12.0–16.0)
MCH: 31.2 pg (ref 26.0–34.0)
MCHC: 34.1 g/dL (ref 32.0–36.0)
MCV: 91.4 fL (ref 80.0–100.0)
PLATELETS: 287 10*3/uL (ref 150–440)
RBC: 4.2 MIL/uL (ref 3.80–5.20)
RDW: 13.6 % (ref 11.5–14.5)
WBC: 23.3 10*3/uL — AB (ref 3.6–11.0)

## 2017-12-15 LAB — APTT: APTT: 29 s (ref 24–36)

## 2017-12-15 LAB — PROTIME-INR
INR: 1.01
Prothrombin Time: 13.2 seconds (ref 11.4–15.2)

## 2017-12-15 LAB — POCT PREGNANCY, URINE: PREG TEST UR: NEGATIVE

## 2017-12-15 SURGERY — HYSTERECTOMY, TOTAL, LAPAROSCOPIC
Anesthesia: General

## 2017-12-15 MED ORDER — INDOCYANINE GREEN 25 MG IV SOLR
INTRAVENOUS | Status: AC
  Filled 2017-12-15: qty 25

## 2017-12-15 MED ORDER — FENTANYL CITRATE (PF) 100 MCG/2ML IJ SOLN
INTRAMUSCULAR | Status: AC
  Filled 2017-12-15: qty 4

## 2017-12-15 MED ORDER — SODIUM CHLORIDE FLUSH 0.9 % IV SOLN
INTRAVENOUS | Status: AC
  Filled 2017-12-15: qty 10

## 2017-12-15 MED ORDER — PROMETHAZINE HCL 25 MG/ML IJ SOLN
INTRAMUSCULAR | Status: AC
  Filled 2017-12-15: qty 1

## 2017-12-15 MED ORDER — DIPHENHYDRAMINE HCL 50 MG/ML IJ SOLN: 12.5000 mg | Freq: Four times a day (QID) | INTRAMUSCULAR | Status: DC | PRN

## 2017-12-15 MED ORDER — ACETAMINOPHEN 325 MG PO TABS: 650.0000 mg | ORAL_TABLET | ORAL | Status: DC | PRN

## 2017-12-15 MED ORDER — BUPIVACAINE HCL (PF) 0.5 % IJ SOLN
INTRAMUSCULAR | Status: AC
  Filled 2017-12-15: qty 30

## 2017-12-15 MED ORDER — PROPOFOL 10 MG/ML IV BOLUS
INTRAVENOUS | Status: AC
Start: 2017-12-15 — End: 2017-12-15
  Filled 2017-12-15: qty 20

## 2017-12-15 MED ORDER — PHENYLEPHRINE HCL 10 MG/ML IJ SOLN
INTRAMUSCULAR | Status: DC | PRN
  Administered 2017-12-15: 200 ug via INTRAVENOUS

## 2017-12-15 MED ORDER — ROCURONIUM BROMIDE 50 MG/5ML IV SOLN
INTRAVENOUS | Status: AC
  Filled 2017-12-15: qty 1

## 2017-12-15 MED ORDER — ENOXAPARIN SODIUM 40 MG/0.4ML ~~LOC~~ SOLN
40.0000 mg | Freq: Once | SUBCUTANEOUS | Status: AC
  Administered 2017-12-15: 40 mg via SUBCUTANEOUS
  Filled 2017-12-15: qty 0.4

## 2017-12-15 MED ORDER — FENTANYL CITRATE (PF) 100 MCG/2ML IJ SOLN
INTRAMUSCULAR | Status: AC
  Administered 2017-12-15: 25 ug via INTRAVENOUS
  Filled 2017-12-15: qty 2

## 2017-12-15 MED ORDER — ONDANSETRON HCL 4 MG/2ML IJ SOLN
INTRAMUSCULAR | Status: DC | PRN
  Administered 2017-12-15: 4 mg via INTRAVENOUS

## 2017-12-15 MED ORDER — CEFAZOLIN SODIUM-DEXTROSE 2-4 GM/100ML-% IV SOLN
INTRAVENOUS | Status: AC
  Filled 2017-12-15: qty 100

## 2017-12-15 MED ORDER — DIPHENHYDRAMINE HCL 12.5 MG/5ML PO ELIX
12.5000 mg | ORAL_SOLUTION | Freq: Four times a day (QID) | ORAL | Status: DC | PRN
  Filled 2017-12-15: qty 5

## 2017-12-15 MED ORDER — PHENYLEPHRINE HCL 10 MG/ML IJ SOLN
INTRAMUSCULAR | Status: AC
  Filled 2017-12-15: qty 1

## 2017-12-15 MED ORDER — HYDROMORPHONE HCL 1 MG/ML IJ SOLN
0.2500 mg | INTRAMUSCULAR | Status: DC | PRN
  Administered 2017-12-15 (×3): 0.5 mg via INTRAVENOUS
  Administered 2017-12-15 (×2): 0.25 mg via INTRAVENOUS

## 2017-12-15 MED ORDER — CHLORHEXIDINE GLUCONATE CLOTH 2 % EX PADS: 6.0000 | MEDICATED_PAD | Freq: Once | CUTANEOUS | Status: DC

## 2017-12-15 MED ORDER — LACTATED RINGERS IV SOLN
INTRAVENOUS | Status: DC
  Administered 2017-12-15 – 2017-12-17 (×5): via INTRAVENOUS

## 2017-12-15 MED ORDER — ACETAMINOPHEN 10 MG/ML IV SOLN
INTRAVENOUS | Status: DC | PRN
  Administered 2017-12-15: 1000 mg via INTRAVENOUS

## 2017-12-15 MED ORDER — ACETAMINOPHEN NICU IV SYRINGE 10 MG/ML
INTRAVENOUS | Status: AC
  Filled 2017-12-15: qty 1

## 2017-12-15 MED ORDER — HYDROMORPHONE HCL 1 MG/ML IJ SOLN
INTRAMUSCULAR | Status: AC
  Administered 2017-12-15: 0.25 mg via INTRAVENOUS
  Filled 2017-12-15: qty 1

## 2017-12-15 MED ORDER — LIDOCAINE HCL (CARDIAC) 20 MG/ML IV SOLN
INTRAVENOUS | Status: DC | PRN
  Administered 2017-12-15: 50 mg via INTRAVENOUS

## 2017-12-15 MED ORDER — ONDANSETRON HCL 4 MG/2ML IJ SOLN
INTRAMUSCULAR | Status: AC
  Filled 2017-12-15: qty 2

## 2017-12-15 MED ORDER — GABAPENTIN 600 MG PO TABS
300.0000 mg | ORAL_TABLET | Freq: Two times a day (BID) | ORAL | Status: DC
  Filled 2017-12-15: qty 0.5

## 2017-12-15 MED ORDER — GABAPENTIN 300 MG PO CAPS
900.0000 mg | ORAL_CAPSULE | Freq: Every day | ORAL | Status: DC
  Administered 2017-12-15 – 2017-12-17 (×2): 900 mg via ORAL
  Filled 2017-12-15 (×2): qty 3

## 2017-12-15 MED ORDER — BUPIVACAINE HCL (PF) 0.5 % IJ SOLN
INTRAMUSCULAR | Status: DC | PRN
  Administered 2017-12-15: 7 mL

## 2017-12-15 MED ORDER — ONDANSETRON HCL 4 MG/2ML IJ SOLN: 4.0000 mg | Freq: Four times a day (QID) | INTRAMUSCULAR | Status: DC | PRN

## 2017-12-15 MED ORDER — HYDROMORPHONE HCL 1 MG/ML IJ SOLN
INTRAMUSCULAR | Status: AC
  Administered 2017-12-15: 0.5 mg via INTRAVENOUS
  Filled 2017-12-15: qty 1

## 2017-12-15 MED ORDER — PROPOFOL 10 MG/ML IV BOLUS
INTRAVENOUS | Status: DC | PRN
  Administered 2017-12-15: 130 mg via INTRAVENOUS

## 2017-12-15 MED ORDER — FLEET ENEMA 7-19 GM/118ML RE ENEM: 1.0000 | ENEMA | Freq: Once | RECTAL | Status: DC

## 2017-12-15 MED ORDER — MIDAZOLAM HCL 2 MG/2ML IJ SOLN
INTRAMUSCULAR | Status: AC
  Filled 2017-12-15: qty 2

## 2017-12-15 MED ORDER — LIDOCAINE HCL (PF) 2 % IJ SOLN
INTRAMUSCULAR | Status: AC
  Filled 2017-12-15: qty 10

## 2017-12-15 MED ORDER — ONDANSETRON HCL 4 MG/2ML IJ SOLN
4.0000 mg | Freq: Once | INTRAMUSCULAR | Status: AC | PRN
  Administered 2017-12-15: 4 mg via INTRAVENOUS

## 2017-12-15 MED ORDER — SUCCINYLCHOLINE CHLORIDE 20 MG/ML IJ SOLN
INTRAMUSCULAR | Status: AC
  Filled 2017-12-15: qty 1

## 2017-12-15 MED ORDER — HYDROMORPHONE 1 MG/ML IV SOLN
INTRAVENOUS | Status: DC
  Administered 2017-12-15: 1.5 mg via INTRAVENOUS
  Administered 2017-12-15: 17:00:00 via INTRAVENOUS
  Administered 2017-12-15: 1.5 mg via INTRAVENOUS
  Administered 2017-12-16: 1.2 mg via INTRAVENOUS
  Administered 2017-12-16: 0.3 mg via INTRAVENOUS
  Administered 2017-12-16: 7.2 mg via INTRAVENOUS
  Administered 2017-12-16: 2.7 mg via INTRAVENOUS
  Administered 2017-12-16: 1.8 mg via INTRAVENOUS
  Administered 2017-12-17: 2.8 mg via INTRAVENOUS
  Administered 2017-12-17: 1.8 mg via INTRAVENOUS
  Filled 2017-12-15: qty 25

## 2017-12-15 MED ORDER — OXYCODONE HCL 10 MG PO TABS: 10.0000 mg | ORAL_TABLET | ORAL | 0 refills | Status: DC

## 2017-12-15 MED ORDER — SODIUM CHLORIDE 0.9% FLUSH: 9.0000 mL | INTRAVENOUS | Status: DC | PRN

## 2017-12-15 MED ORDER — FENTANYL CITRATE (PF) 100 MCG/2ML IJ SOLN
INTRAMUSCULAR | Status: AC
  Filled 2017-12-15: qty 2

## 2017-12-15 MED ORDER — DEXAMETHASONE SODIUM PHOSPHATE 10 MG/ML IJ SOLN
INTRAMUSCULAR | Status: AC
  Filled 2017-12-15: qty 1

## 2017-12-15 MED ORDER — LACTATED RINGERS IV SOLN
INTRAVENOUS | Status: DC | PRN
  Administered 2017-12-15: 07:00:00 via INTRAVENOUS

## 2017-12-15 MED ORDER — NALOXONE HCL 0.4 MG/ML IJ SOLN: 0.4000 mg | INTRAMUSCULAR | Status: DC | PRN

## 2017-12-15 MED ORDER — FENTANYL CITRATE (PF) 100 MCG/2ML IJ SOLN
INTRAMUSCULAR | Status: AC
  Administered 2017-12-15: 50 ug via INTRAVENOUS
  Filled 2017-12-15: qty 2

## 2017-12-15 MED ORDER — GABAPENTIN 300 MG PO CAPS
300.0000 mg | ORAL_CAPSULE | Freq: Two times a day (BID) | ORAL | Status: DC
  Administered 2017-12-16 – 2017-12-17 (×4): 300 mg via ORAL
  Filled 2017-12-15 (×4): qty 1

## 2017-12-15 MED ORDER — INDOCYANINE GREEN 25 MG IV SOLR
INTRAVENOUS | Status: DC | PRN
  Administered 2017-12-15: 25 mg

## 2017-12-15 MED ORDER — SOD CITRATE-CITRIC ACID 500-334 MG/5ML PO SOLN
30.0000 mL | Freq: Once | ORAL | Status: DC
  Filled 2017-12-15 (×2): qty 30

## 2017-12-15 MED ORDER — MIDAZOLAM HCL 2 MG/2ML IJ SOLN
INTRAMUSCULAR | Status: DC | PRN
  Administered 2017-12-15: 2 mg via INTRAVENOUS

## 2017-12-15 MED ORDER — SUGAMMADEX SODIUM 200 MG/2ML IV SOLN
INTRAVENOUS | Status: AC
Start: 2017-12-15 — End: 2017-12-15
  Filled 2017-12-15: qty 2

## 2017-12-15 MED ORDER — FENTANYL CITRATE (PF) 100 MCG/2ML IJ SOLN
INTRAMUSCULAR | Status: DC | PRN
  Administered 2017-12-15 (×3): 50 ug via INTRAVENOUS
  Administered 2017-12-15: 100 ug via INTRAVENOUS
  Administered 2017-12-15: 50 ug via INTRAVENOUS

## 2017-12-15 MED ORDER — ROCURONIUM BROMIDE 100 MG/10ML IV SOLN
INTRAVENOUS | Status: DC | PRN
  Administered 2017-12-15: 50 mg via INTRAVENOUS
  Administered 2017-12-15 (×2): 10 mg via INTRAVENOUS

## 2017-12-15 MED ORDER — FENTANYL CITRATE (PF) 100 MCG/2ML IJ SOLN: 25.0000 ug | INTRAMUSCULAR | Status: DC | PRN

## 2017-12-15 MED ORDER — DEXAMETHASONE SODIUM PHOSPHATE 10 MG/ML IJ SOLN
INTRAMUSCULAR | Status: DC | PRN
  Administered 2017-12-15: 8 mg via INTRAVENOUS

## 2017-12-15 MED ORDER — SUGAMMADEX SODIUM 200 MG/2ML IV SOLN
INTRAVENOUS | Status: DC | PRN
  Administered 2017-12-15: 110 mg via INTRAVENOUS

## 2017-12-15 MED ORDER — MORPHINE SULFATE (PF) 2 MG/ML IV SOLN: 1.0000 mg | INTRAVENOUS | Status: DC | PRN

## 2017-12-15 MED ORDER — ACETAMINOPHEN 650 MG RE SUPP: 650.0000 mg | RECTAL | Status: DC | PRN

## 2017-12-15 MED ORDER — FAMOTIDINE 20 MG PO TABS
20.0000 mg | ORAL_TABLET | Freq: Once | ORAL | Status: AC
  Administered 2017-12-15: 20 mg via ORAL

## 2017-12-15 MED ORDER — LACTATED RINGERS IV SOLN
INTRAVENOUS | Status: DC
  Administered 2017-12-15: 50 mL/h via INTRAVENOUS

## 2017-12-15 MED ORDER — FAMOTIDINE 20 MG PO TABS
ORAL_TABLET | ORAL | Status: AC
  Filled 2017-12-15: qty 1

## 2017-12-15 SURGICAL SUPPLY — 59 items
APPLICATOR SURGIFLO ENDO (HEMOSTASIS) IMPLANT
BAG URINE DRAINAGE (UROLOGICAL SUPPLIES) ×4 IMPLANT
BLADE SURG 15 STRL LF DISP TIS (BLADE) ×2 IMPLANT
BLADE SURG 15 STRL SS (BLADE) ×2
CANISTER SUCT 1200ML W/VALVE (MISCELLANEOUS) ×4 IMPLANT
CANNULA DILATOR  5MM W/SLV (CANNULA) ×1
CANNULA DILATOR 5 W/SLV (CANNULA) ×3 IMPLANT
CATH FOLEY 2WAY  5CC 16FR (CATHETERS) ×2
CATH URTH 16FR FL 2W BLN LF (CATHETERS) ×2 IMPLANT
CHLORAPREP W/TINT 26ML (MISCELLANEOUS) ×4 IMPLANT
CORD MONOPOLAR M/FML 12FT (MISCELLANEOUS) ×4 IMPLANT
DEFOGGER SCOPE WARMER CLEARIFY (MISCELLANEOUS) ×4 IMPLANT
DERMABOND ADVANCED (GAUZE/BANDAGES/DRESSINGS) ×2
DERMABOND ADVANCED .7 DNX12 (GAUZE/BANDAGES/DRESSINGS) ×2 IMPLANT
DEVICE SUTURE ENDOST 10MM (ENDOMECHANICALS) ×4 IMPLANT
DRAPE STERI POUCH LG 24X46 STR (DRAPES) ×8 IMPLANT
GLOVE BIO SURGEON STRL SZ8 (GLOVE) ×20 IMPLANT
GLOVE INDICATOR 8.0 STRL GRN (GLOVE) ×20 IMPLANT
GOWN STRL REUS W/ TWL LRG LVL3 (GOWN DISPOSABLE) ×4 IMPLANT
GOWN STRL REUS W/ TWL XL LVL3 (GOWN DISPOSABLE) ×2 IMPLANT
GOWN STRL REUS W/TWL LRG LVL3 (GOWN DISPOSABLE) ×4
GOWN STRL REUS W/TWL XL LVL3 (GOWN DISPOSABLE) ×2
IRRIGATION STRYKERFLOW (MISCELLANEOUS) IMPLANT
IRRIGATOR STRYKERFLOW (MISCELLANEOUS)
IV LACTATED RINGERS 1000ML (IV SOLUTION) ×4 IMPLANT
KIT PINK PAD W/HEAD ARE REST (MISCELLANEOUS) ×4
KIT PINK PAD W/HEAD ARM REST (MISCELLANEOUS) ×2 IMPLANT
LABEL OR SOLS (LABEL) ×4 IMPLANT
LIGASURE VESSEL 5MM BLUNT TIP (ELECTROSURGICAL) ×4 IMPLANT
MANIPULATOR VCARE LG CRV RETR (MISCELLANEOUS) ×4 IMPLANT
MANIPULATOR VCARE SML CRV RETR (MISCELLANEOUS) IMPLANT
MANIPULATOR VCARE STD CRV RETR (MISCELLANEOUS) IMPLANT
NDL INSUFF ACCESS 14 VERSASTEP (NEEDLE) ×4 IMPLANT
NEEDLE SPNL 22GX5 LNG QUINC BK (NEEDLE) ×4 IMPLANT
NS IRRIG 500ML POUR BTL (IV SOLUTION) ×4 IMPLANT
OCCLUDER COLPOPNEUMO (BALLOONS) ×4 IMPLANT
PACK GYN LAPAROSCOPIC (MISCELLANEOUS) ×4 IMPLANT
PAD OB MATERNITY 4.3X12.25 (PERSONAL CARE ITEMS) ×4 IMPLANT
PAD PREP 24X41 OB/GYN DISP (PERSONAL CARE ITEMS) ×4 IMPLANT
POUCH ENDO CATCH II 15MM (MISCELLANEOUS) IMPLANT
SET CYSTO W/LG BORE CLAMP LF (SET/KITS/TRAYS/PACK) ×4 IMPLANT
SHEARS ENDO 5MM 31CM (CUTTER) ×4 IMPLANT
SPOGE SURGIFLO 8M (HEMOSTASIS)
SPONGE LAP 4X18 5PK (MISCELLANEOUS) IMPLANT
SPONGE SURGIFLO 8M (HEMOSTASIS) IMPLANT
SPONGE XRAY 4X4 16PLY STRL (MISCELLANEOUS) ×4 IMPLANT
SUT ENDO VLOC 180-0-8IN (SUTURE) ×8 IMPLANT
SUT MNCRL 4-0 (SUTURE) ×4
SUT MNCRL 4-0 27XMFL (SUTURE) ×4
SUT VIC AB 0 CT1 36 (SUTURE) ×8 IMPLANT
SUT VIC AB 2-0 UR6 27 (SUTURE) ×8 IMPLANT
SUTURE MNCRL 4-0 27XMF (SUTURE) ×4 IMPLANT
SYR 10ML LL (SYRINGE) ×4 IMPLANT
SYR 3ML LL SCALE MARK (SYRINGE) ×8 IMPLANT
SYR 50ML LL SCALE MARK (SYRINGE) ×8 IMPLANT
TROCAR BLUNT TIP 12MM OMST12BT (TROCAR) ×4 IMPLANT
TROCAR VERSASTEP PLUS 12MM (TROCAR) ×8 IMPLANT
TROCAR VERSASTEP PLUS 5MM (TROCAR) ×8 IMPLANT
TUBING INSUF HEATED (TUBING) ×4 IMPLANT

## 2017-12-15 NOTE — Anesthesia Procedure Notes (Addendum)
Procedure Name: Intubation Performed by: Lance Muss, CRNA Pre-anesthesia Checklist: Patient identified, Patient being monitored, Timeout performed, Emergency Drugs available and Suction available Patient Re-evaluated:Patient Re-evaluated prior to induction Oxygen Delivery Method: Circle system utilized Preoxygenation: Pre-oxygenation with 100% oxygen Induction Type: IV induction Ventilation: Mask ventilation without difficulty Laryngoscope Size: 3 and McGraph Grade View: Grade II Tube type: Oral Tube size: 7.0 mm Number of attempts: 1 Airway Equipment and Method: Stylet Placement Confirmation: ETT inserted through vocal cords under direct vision,  positive ETCO2 and breath sounds checked- equal and bilateral Secured at: 21 cm Tube secured with: Tape Dental Injury: Teeth and Oropharynx as per pre-operative assessment  Difficulty Due To: Difficult Airway-  due to neck instability and Difficulty was anticipated Future Recommendations: Recommend- induction with short-acting agent, and alternative techniques readily available Comments: Maintained in line neck stability. No AO extension.

## 2017-12-15 NOTE — Progress Notes (Signed)
Notified that patient was intolerant of her pain postop. She is opioid dependent, drug abuser, and allergic to NSAIDS.  Will admit overnight for pain control and evaluate her status.   She is a patient of Dr. Jeannie Fend, who is unavailable at the time of admission.    PCA dilaudid Gabapentin 300 BID with 900 hs  ----- Larey Days, MD Attending Obstetrician and Gynecologist [facility], Department of [facility]

## 2017-12-15 NOTE — Addendum Note (Signed)
Addendum  created 12/15/17 1510 by Doreen Salvage, CRNA   Charge Capture section accepted

## 2017-12-15 NOTE — Progress Notes (Signed)
Dr Fransisca Connors at bedside. Swelling in RLQ palpated and incision opened. Large amount of blood drained out. Pressure applied to area for 20 minutes per order of Dr Fransisca Connors. Labs ordered and drawn by Lab

## 2017-12-15 NOTE — Op Note (Signed)
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  CONDITION: stable  INDICATIONS: possible uterine cancer  FINDINGS: Normal pelvic anatomy and upper abdomen.  Frozen section showed not gross lesions in the uterus.  PROCEDURE IN DETAIL: After informed consent was obtained, the patient was taken to the operating room where anesthesia was obtained without difficulty. The patient was positioned in the dorsal lithotomy position in Alpha and her arms were carefully tucked at her sides and the usual precautions were taken.  She was prepped and draped in normal sterile fashion.  Time-out was performed and a Foley catheter was placed into the bladder and the cervix was infiltrated with 4 ml of ICG fluorescent dye at 3 an 9 o'clock both superficial and deep injections. A small VCare uterine manipulator was then placed in the uterus without incident.    An open Hasson technique was used to place an infraumbilical 10-CH baloon trocar under direct visualization. The laparoscope was introduced and CO2 gas was infused for pneumoperitoneum to a pressure of 15 mm Hg.  Right and left lateral 5-mm ports and a 5-12 mm suprapubic port were placed under direct visualization of the laparoscope using an EndoStep technique.  Cytologic washings were obtained.  The patient was placed in Trendelenburg and the bowel was  displaced up into the upper abdomen.  Round ligaments were divided on each side with the EndoShears and the retroperitoneal space was opened bilaterally.  The ureters were identified and preserved.  At this point the retroperitoneal spaces were developed and the lymphatic channels were mapped to each side.  The sentinel node on the right side was then identified in the right external iliac area, skeletonized and removed taking care not to injure the  obturator nerve, the ureter or the pelvic vasculature.  Similarly on the left side, the retroperitoneal spaces were developed, the lymphatic channels mapped to identify the external iliac sentinel node(s) and they were similarly removed with care to preserve the obturator nerve, the ureter and the pelvic vasculature. With hemostasis secured, the infundibulopelvic ligaments were skeletonized, sealed and divided with the LigaSure device.  A bladder flap was created and the bladder was dissected down off the lower uterine segment and cervix using endoshears and electrocautery.  The uterine arteries were skeletonized bilaterally, sealed and divided with the LigaSure device.  A colpotomy was performed circumferentially along the V-Care ring with electrocautery and the cervix was incised from the vagina and the specimen was removed through the vagina.  A pneumo balloon was placed in the vagina and the vaginal cuff was then closed in a running continuous fashion using the EndoStitch technique with 0 V-Lock suture with careful attention to include the vaginal cuff angles and the vaginal mucosa within the closure.  A second stitch was started from the other side because the suture was short and the two were tied together in the midline.  Intraoperative pathologic evaluation revealed no cancer and therefore further dissection was not performed.  Hemostasis was observed. The intraperitoneal pressure was dropped, and all planes of dissection, vascular pedicles and the vaginal  cuff were found to be hemostatic.  The suprapubic trocar and lateral trochars were removed and the fascia was closed with 0 Vicryl suture using the Endoclose technique.  Before the umbilical trocar was removed the CO2 gas was released.  The fascia there was closed with 0 Vicryl suture in interrupted figure of eight technique.   The skin incisions were closed with subcuticular stitches and glue. The patient tolerated the procedure well.  Sponge, lap and needle counts were correct x2.  The patient was taken to recovery room in excellent condition.  Antibiotics: 2 gm Ancef prior to incision   VTE prophylaxis: was ordered perioperatively with IPCs and Lovonox.    Mellody Drown, MD

## 2017-12-15 NOTE — Anesthesia Preprocedure Evaluation (Signed)
Anesthesia Evaluation  Patient identified by MRN, date of birth, ID band Patient awake    Reviewed: Allergy & Precautions, H&P , NPO status , Patient's Chart, lab work & pertinent test results, reviewed documented beta blocker date and time   Airway Mallampati: II  TM Distance: >3 FB Neck ROM: full    Dental  (+) Teeth Intact   Pulmonary neg pulmonary ROS,    Pulmonary exam normal        Cardiovascular negative cardio ROS Normal cardiovascular exam Rhythm:regular Rate:Normal     Neuro/Psych Seizures -, Well Controlled,  PSYCHIATRIC DISORDERS Anxiety Depression Bipolar Disorder negative neurological ROS  negative psych ROS   GI/Hepatic negative GI ROS, Neg liver ROS, GERD  Medicated,  Endo/Other  negative endocrine ROSHypothyroidism   Renal/GU negative Renal ROS  negative genitourinary   Musculoskeletal   Abdominal   Peds  Hematology negative hematology ROS (+)   Anesthesia Other Findings Past Medical History: No date: Abnormal uterine bleeding No date: ADHD (attention deficit hyperactivity disorder) No date: Allergy No date: Anxiety No date: Bipolar 1 disorder (HCC) No date: Bipolar disorder ([place]) No date: DDD (degenerative disc disease) No date: Depression No date: HSV infection No date: Hx: UTI (urinary tract infection) No date: Pertussis No date: Seizures ([place])     Comment:  last seizure 12/2014  No date: Thyroid disease Past Surgical History: No date: CERVICAL SPINE SURGERY     Comment:  x 3 2001,2005,2007 c5-6 2003: CESAREAN SECTION 10/11/2017: HYSTEROSCOPY; N/A     Comment:  Procedure: HYSTEROSCOPY;  Surgeon: Harlin Heys,               MD;  Location: [facility] ORS;  Service: Gynecology;                Laterality: N/A; 10/11/2017: HYSTEROSCOPY W/D&C; N/A     Comment:  Procedure: DILATATION AND CURETTAGE /HYSTEROSCOPY;                Surgeon: Harlin Heys, MD;  Location: [facility] ORS;         Service: Gynecology;  Laterality: N/A; No date: TONSILLECTOMY No date: TUBAL LIGATION BMI    Body Mass Index:  22.11 kg/m     Reproductive/Obstetrics negative OB ROS                             Anesthesia Physical Anesthesia Plan  ASA: II  Anesthesia Plan: General ETT   Post-op Pain Management:    Induction:   PONV Risk Score and Plan:   Airway Management Planned: Video Laryngoscope Planned  Additional Equipment:   Intra-op Plan:   Post-operative Plan:   Informed Consent: I have reviewed the patients History and Physical, chart, labs and discussed the procedure including the risks, benefits and alternatives for the proposed anesthesia with the patient or authorized representative who has indicated his/her understanding and acceptance.   Dental Advisory Given  Plan Discussed with: CRNA  Anesthesia Plan Comments: (Pt sp previous cervical fusion with limited ROM but no upper extr pain or weakness with flexion or extension.  Will provide in line stabilization with DL)        Anesthesia Quick Evaluation

## 2017-12-15 NOTE — Progress Notes (Signed)
Dr Fransisca Connors notified of continued oozing of blood from port site. States continue to hold pressure and he will be back in to assess.

## 2017-12-15 NOTE — Anesthesia Postprocedure Evaluation (Signed)
Anesthesia Post Note  Patient: [name]  Procedure(s) Performed: HYSTERECTOMY TOTAL LAPAROSCOPIC (Bilateral ) SENTINEL NODE BIOPSY,MAPPING (N/A ) LAPAROSCOPIC BILATERAL SALPINGO OOPHORECTOMY (Bilateral )  Patient location during evaluation: PACU Anesthesia Type: General Level of consciousness: awake and alert Pain management: pain level controlled Vital Signs Assessment: post-procedure vital signs reviewed and stable Respiratory status: spontaneous breathing, nonlabored ventilation, respiratory function stable and patient connected to nasal cannula oxygen Cardiovascular status: blood pressure returned to baseline and stable Postop Assessment: no apparent nausea or vomiting Anesthetic complications: no     Last Vitals:  Vitals:   12/15/17 1033 12/15/17 1038  BP:    Pulse: 84 89  Resp: 17 14  Temp:    SpO2: 99% 100%    Last Pain:  Vitals:   12/15/17 1038  TempSrc:   PainSc: 8                  Molli Barrows

## 2017-12-15 NOTE — Transfer of Care (Signed)
Immediate Anesthesia Transfer of Care Note  Patient: Terrance Mass  Procedure(s) Performed: HYSTERECTOMY TOTAL LAPAROSCOPIC (Bilateral ) SENTINEL NODE BIOPSY,MAPPING (N/A ) LAPAROSCOPIC BILATERAL SALPINGO OOPHORECTOMY (Bilateral )  Patient Location: PACU  Anesthesia Type:General  Level of Consciousness: awake and alert   Airway & Oxygen Therapy: Patient Spontanous Breathing and Patient connected to face mask oxygen  Post-op Assessment: Report given to RN and Post -op Vital signs reviewed and stable  Post vital signs: Reviewed and stable  Last Vitals:  Vitals:   12/15/17 1013 12/15/17 1019  BP: 119/80 119/80  Pulse: 76 82  Resp: 12 19  Temp: 37.2 C   SpO2: 100% 100%    Last Pain:  Vitals:   12/15/17 0624  TempSrc: Tympanic  PainSc: 7          Complications: No apparent anesthesia complications

## 2017-12-15 NOTE — Consult Note (Signed)
I was called to post op earlier today about an hour after surgery because of bleeding from some of the incisions.  There was a small amount of bleeding from the para umbilical incision that responded to pressure.  Around the right lower quadrant port incision there was a large subcutaneous bulge consistent with hematoma and there was bleeding from the incision.  I removed the stitch from the incision and released a good bit of liquid blood.  Pressure was applied for an hour and bleeding stopped.  She was having trouble voiding and a foley catch was replaced. Vitals were stable, although she did have some pain and this was treated.  CBC and PT, PTT were checked and reassuring.     I just re-examined her at 4 PM with my nurse Mariea Clonts and there is no further bleeding or hematoma.  She is comfortable and appears stable.  She will be transferred to the hospital ward overnight and Dr Larey Days will discharge her in the morning. Mellody Drown, MD

## 2017-12-15 NOTE — Progress Notes (Signed)
Dr Leonides Schanz and Dr Fransisca Connors notified of patient having bleeding from 2 port sites and also swelling in RLQ. Dr Fransisca Connors in route to assess patient. VSS

## 2017-12-15 NOTE — Anesthesia Post-op Follow-up Note (Signed)
Anesthesia QCDR form completed.        

## 2017-12-15 NOTE — Progress Notes (Signed)
Patient was anxious and emotional. Chaplain taught the patient a short meditation and breathing exercise to help ease her anxiety. Patient responded positively.

## 2017-12-15 NOTE — Interval H&P Note (Signed)
History and Physical Interval Note:  12/15/2017 7:06 AM  [name]  has presented today for surgery, with the diagnosis of ATYPICAL GLANDULAR PROLIFERATION CONCERNING FOR ENDOMETRIAL CANCER The various methods of treatment have been discussed with the patient and family. After consideration of risks, benefits and other options for treatment, the patient has consented to  Procedure(s): HYSTERECTOMY TOTAL LAPAROSCOPIC (Bilateral) SENTINEL NODE BIOPSY,MAPPING (N/A) as a surgical intervention .  The patient's history has been reviewed, patient examined, no change in status, stable for surgery.  I have reviewed the patient's chart and labs.  Questions were answered to the patient's satisfaction.     Mellody Drown

## 2017-12-16 ENCOUNTER — Encounter
Admission: AD | Disposition: A | Discharge status: Home / Self Care | Payer: Self-pay | Source: Ambulatory Visit | Attending: Obstetrics & Gynecology

## 2017-12-16 ENCOUNTER — Encounter: Payer: Self-pay | Admitting: Obstetrics and Gynecology

## 2017-12-16 DIAGNOSIS — R109 Unspecified abdominal pain: Secondary | ICD-10-CM

## 2017-12-16 DIAGNOSIS — N99820 Postprocedural hemorrhage and hematoma of a genitourinary system organ or structure following a genitourinary system procedure: Secondary | ICD-10-CM

## 2017-12-16 HISTORY — PX: EMBOLIZATION: CATH118239

## 2017-12-16 LAB — CBC
HEMATOCRIT: 28.3 % — AB (ref 35.0–47.0)
Hemoglobin: 9.4 g/dL — ABNORMAL LOW (ref 12.0–16.0)
MCH: 30.6 pg (ref 26.0–34.0)
MCHC: 33.1 g/dL (ref 32.0–36.0)
MCV: 92.4 fL (ref 80.0–100.0)
Platelets: 234 10*3/uL (ref 150–440)
RBC: 3.06 MIL/uL — ABNORMAL LOW (ref 3.80–5.20)
RDW: 13.8 % (ref 11.5–14.5)
WBC: 13.6 10*3/uL — ABNORMAL HIGH (ref 3.6–11.0)

## 2017-12-16 LAB — PROTIME-INR
INR: 1
Prothrombin Time: 13.1 seconds (ref 11.4–15.2)

## 2017-12-16 LAB — CYTOLOGY - NON PAP

## 2017-12-16 LAB — PREPARE RBC (CROSSMATCH)

## 2017-12-16 LAB — SURGICAL PATHOLOGY

## 2017-12-16 SURGERY — EMBOLIZATION
Anesthesia: Moderate Sedation

## 2017-12-16 MED ORDER — FENTANYL CITRATE (PF) 100 MCG/2ML IJ SOLN
INTRAMUSCULAR | Status: AC
  Filled 2017-12-16: qty 4

## 2017-12-16 MED ORDER — FENTANYL CITRATE (PF) 100 MCG/2ML IJ SOLN
INTRAMUSCULAR | Status: DC | PRN
  Administered 2017-12-16: 50 ug via INTRAVENOUS
  Administered 2017-12-16: 25 ug via INTRAVENOUS

## 2017-12-16 MED ORDER — CEFAZOLIN SODIUM-DEXTROSE 1-4 GM/50ML-% IV SOLN
1.0000 g | Freq: Once | INTRAVENOUS | Status: AC
  Administered 2017-12-16: 1 g via INTRAVENOUS

## 2017-12-16 MED ORDER — MIDAZOLAM HCL 5 MG/5ML IJ SOLN
INTRAMUSCULAR | Status: AC
  Filled 2017-12-16: qty 5

## 2017-12-16 MED ORDER — IOPAMIDOL (ISOVUE-300) INJECTION 61%
INTRAVENOUS | Status: DC | PRN
  Administered 2017-12-16: 45 mL via INTRAVENOUS

## 2017-12-16 MED ORDER — SODIUM CHLORIDE 0.9 % IV SOLN: INTRAVENOUS | Status: DC

## 2017-12-16 MED ORDER — DIPHENHYDRAMINE HCL 25 MG PO CAPS
ORAL_CAPSULE | ORAL | Status: AC
  Administered 2017-12-16: 25 mg via ORAL
  Filled 2017-12-16: qty 1

## 2017-12-16 MED ORDER — MIDAZOLAM HCL 2 MG/2ML IJ SOLN
INTRAMUSCULAR | Status: DC | PRN
  Administered 2017-12-16: 1 mg via INTRAVENOUS

## 2017-12-16 MED ORDER — DIPHENHYDRAMINE HCL 25 MG PO CAPS
25.0000 mg | ORAL_CAPSULE | Freq: Once | ORAL | Status: AC
  Administered 2017-12-16: 25 mg via ORAL

## 2017-12-16 MED ORDER — ACETAMINOPHEN 500 MG PO TABS
1000.0000 mg | ORAL_TABLET | Freq: Four times a day (QID) | ORAL | Status: DC
  Administered 2017-12-17 (×3): 1000 mg via ORAL
  Filled 2017-12-16 (×4): qty 2

## 2017-12-16 MED ORDER — ACETAMINOPHEN 500 MG PO TABS
1000.0000 mg | ORAL_TABLET | ORAL | Status: DC
Start: 2017-12-16 — End: 2017-12-16
  Administered 2017-12-16: 1000 mg via ORAL

## 2017-12-16 MED ORDER — LIDOCAINE-EPINEPHRINE (PF) 1 %-1:200000 IJ SOLN
INTRAMUSCULAR | Status: AC
  Filled 2017-12-16: qty 30

## 2017-12-16 MED ORDER — SODIUM CHLORIDE 0.9 % IV SOLN
Freq: Once | INTRAVENOUS | Status: AC
  Administered 2017-12-16: 17:00:00 via INTRAVENOUS

## 2017-12-16 MED ORDER — DIPHENHYDRAMINE HCL 25 MG PO CAPS: 25.0000 mg | ORAL_CAPSULE | ORAL | Status: DC | PRN

## 2017-12-16 MED ORDER — LIDOCAINE 5 % EX PTCH
1.0000 | MEDICATED_PATCH | CUTANEOUS | Status: DC
  Administered 2017-12-16: 1 via TRANSDERMAL
  Filled 2017-12-16: qty 1

## 2017-12-16 SURGICAL SUPPLY — 17 items
CATH BEACON 5 .035 65 RIM TIP (CATHETERS) ×3 IMPLANT
CATH MICROCATH PRGRT 2.8F 110 (CATHETERS) ×1 IMPLANT
CATH PIG 70CM (CATHETERS) ×3 IMPLANT
COIL 400 COMPLEX SOFT 2X2CM (Vascular Products) ×6 IMPLANT
COIL 400 COMPLEX SOFT 2X4CM (Vascular Products) ×6 IMPLANT
COIL 400 COMPLEX SOFT 3X15CM (Vascular Products) ×6 IMPLANT
COIL 400 COMPLEX SOFT 3X5CM (Vascular Products) ×3 IMPLANT
COIL 400 COMPLEX STD 3X5CM (Vascular Products) ×3 IMPLANT
DEVICE STARCLOSE SE CLOSURE (Vascular Products) ×3 IMPLANT
DEVICE TORQUE (MISCELLANEOUS) ×3 IMPLANT
GLIDEWIRE STIFF .35X180X3 HYDR (WIRE) ×3 IMPLANT
HANDLE DETACHMENT COIL (MISCELLANEOUS) ×3 IMPLANT
MICROCATH PROGREAT 2.8F 110 CM (CATHETERS) ×3
PACK ANGIOGRAPHY (CUSTOM PROCEDURE TRAY) ×3 IMPLANT
SHEATH BRITE TIP 5FRX11 (SHEATH) ×3 IMPLANT
TUBING CONTRAST HIGH PRESS 72 (TUBING) ×3 IMPLANT
WIRE J 3MM .035X145CM (WIRE) ×3 IMPLANT

## 2017-12-16 NOTE — Progress Notes (Addendum)
Obstetric and Gynecology  POD 1  Subjective  Patient tolerating PO intake.  Using incentive spirometer.  Denies CP, SOB, F/C, N/V/D, or leg pain.  Has severe pain on her Right side, with sharp shooting pains between umbilicus and RLQ incision.    Objective  Objective:  BP 106/73 (BP Location: Right Arm)   Pulse 100   Temp 98.4 F (36.9 C) (Oral)   Resp 16   Ht 5\' 1"  (1.549 m)   Wt 53.1 kg (117 lb)   LMP 09/22/2017 (Exact Date)   SpO2 97%   BMI 22.11 kg/m   Intake/Output Summary (Last 24 hours) at 12/16/2017 1211 Last data filed at 12/16/2017 1101 Gross per 24 hour  Intake 1860 ml  Output 955 ml  Net 905 ml     General: mild distress from pain Cardiovascular: RRR, no murmurs Pulmonary: CTAB, normal respiratory effort Abdomen: the right side is distended, and spreading, consistent with subcutaneous fluid collection.  It is tender and firm. Extremities: No erythema or cords, no calf tenderness, with normal peripheral pulses.  Labs: CBC and INR pending  Imaging:  Pending   Assessment   52 y.o. [place] Hospital Day: 2 , POD 1 from uncomplicated TLH BSO with sentinel lymph node sampling, and post op hematoma from the RLQ incision  Plan   1. The amount of distention has me concerned for continued bleeding.  Will check CBC and INR to determine systemic effect and extent.   I personally did not evaluate patient in the PACU or postop, so I do not know what distention was there following surgery (Per Dr. Fransisca Connors it was a "goose egg") but after pressure and evacuation it was not notable.  It is notable now.  My concern is a aberrant vessel, superficial epigastric, or her inferior epigastric, which may have been breached upon closing the incision.    2. My thoughts are: -CT Angiogram to identify persistent bleeding.   -Return to OR to open the incision further and identify vessel and ligate it while evacuating hematoma -IR embolization of vessels with subsequent evacuation  of hematoma for pain relief.  I have paged Vascular surgery and am awaiting their response for guidance.  Patient aware and NPO.  She will continue to use the PCA for pain relief until plan is confirmed and executed.  ----- Larey Days, MD Attending Obstetrician and Gynecologist [facility], Department of [facility]  >60 minutes were spent with this patient, >50% face-to-face, the remaining in consultation,

## 2017-12-16 NOTE — Op Note (Signed)
[name] Percutaneous Study/Intervention Procedural Note     Surgeon(s): [employer]  Assistants: none  Pre-operative Diagnosis: 1.  Epigastric artery bleeding after hysterectomy   Post-operative diagnosis: Same  Procedure(s) Performed: 1. Ultrasound guidance for vascular access left femoral artery 2. Catheter placement into medial and lateral inferior epigastric artery branches on the right 3. Aortogram and selective angiogram of the medial and lateral inferior epigastric arteries 4. Coil embolization of the lateral inferior epigastric artery branch with three 2 mm coils and one 3 mm coil  5.  Coil embolization of the medial inferior epigastric artery branch with one 2 mm coil and three 3 mm coils 6. StarClose closure device left femoral artery  Anesthesia: Moderate conscious sedation for approximately 30 minutes using 1 mg of Versed and 75 Mcg of Fentanyl  EBL: 3 cc  Fluoro Time: 7.6 minutes  Contrast: 45 cc  Indications: Patient is a 52 y.o.female with a large abdominal wall hematoma and hysterectomy.  The bleeding is presumably from the inferior epigastric vessels.  The patient is brought in for angiography for further evaluation and potential treatment. Risks and benefits are discussed and informed consent is obtained  Procedure: The patient was identified and appropriate procedural time out was performed. The patient was then placed supine on the table and prepped and draped in the usual sterile fashion.Moderate conscious sedation was administered during a face to face encounter with the patient throughout the procedure with my supervision of the RN administering medicines and monitoring the patient's vital signs, pulse oximetry, telemetry and mental status throughout from the start of the procedure until the patient was taken to the recovery room.   Ultrasound was used to evaluate the left common femoral artery. It was patent . A digital ultrasound image was acquired. A Seldinger needle was used to access the left common femoral artery under direct ultrasound guidance and a permanent image was performed. A 0.035 J wire was advanced without resistance and a 5Fr sheath was placed. Pigtail catheter was placed into the aorta and an AP aortogram was performed. This demonstrated normal renal arteries, normal aorta and iliac arteries without stenosis.  There did appear to be significant flow through epigastric vessels to the right abdominal wall even on the initial aortogram.  I then advanced a rim catheter down into the right external iliac artery and selective imaging was performed.  This showed a lateral inferior epigastric branch coming off the distal external iliac artery and going straight up to the area in question.  Although there was not a clear blush, there was a brisk vessel going to the area of hematoma.  The medial branch had to primary branches with 1 being downgoing and one being superior going up to the abdominal wall into the area of the hematoma.  I elected to embolize both branches.  First, I cannulated the lateral branch with the rim catheter and a Glidewire without difficulty.  I then advanced beyond the initial branches and up into the abdominal wall in the area of hematoma and just beyond the hematoma with a prograde microcatheter.  I then began coil embolization and used a total of 4 coils.  The first 3 coils were 2 mm coils and these were 2 and 4 cm length with the most proximal coil being a 3 mm x 5 cm coil.  This was done to get beyond the area of bleeding and then bring it back down to a more proximal location proximal to the area  of bleeding.  Successful embolization of the lateral inferior epigastric artery was seen.  I then turned my attention to the medial branch.  The rim catheter selectively cannulated this and the prograde  microcatheter was used to cannulate the superior branch going to the abdominal wall.  Once again, I went beyond the area of hematoma and then worked back with coil embolization.  The first coil was a 2 mm x 2 cm coil.  The next 3 coils were 3 mm coils with the first 2 being 15 cm and the 5 cm.  Successful embolization with no flow now seen in this branch was seen.  I elected to terminate the procedure. The diagnostic catheter was removed. StarClose closure device was deployed in usual fashion with excellent hemostatic result. The patient was taken to the recovery room in stable condition having tolerated the procedure well.      Disposition: Patient was taken to the recovery room in stable condition having tolerated the procedure well.  Complications: None  [name] 12/16/2017 3:53 PM   This note was created with Dragon Medical transcription system. Any errors in dictation are purely unintentional.

## 2017-12-16 NOTE — Consult Note (Signed)
I have spoken with Dr Leonides Schanz about the recurrence of hematoma around the right lower quadrant trochar site she noted today.  Patient has stable pulse, but has a lot of pain and distention and decreased Hct today (38% to 28%).  Dr Lucky Cowboy from Vascular surgery has been consulted and will do an angiogram shortly to see if there is a bleeding arteriole that can be embolized. If not, the natural history of this process is generally for the bleeding to stop on its own due to tamponade.  Do not think that opening the incision to drain blood clot will be helpful, and it is unlikely that a specific bleeding point would be found.  Will continue to monitor closely. Mellody Drown, MD

## 2017-12-16 NOTE — Plan of Care (Signed)
Vs improved; did receive one unit pRBCs (this unit completed at 2010); pt sitting up in bed well; pt tolerating regular diet now; has PCA for pain control; foley catheter to be removed in am

## 2017-12-16 NOTE — Consult Note (Signed)
[facility] Consult Note  MRN : [id number]  [name] is a 52 y.o. (09-24-1966) female who presents with chief complaint of No chief complaint on file. Marland Kitchen  History of Present Illness: I am asked to see the patient by Dr. Leonides Schanz for evaluation of abdominal wall hematoma.  She had a laparoscopic-assisted hysterectomy yesterday, and 1 of the port sites developed a hematoma.  This was evacuated last night but has recurred and is quite large in her right abdominal wall.  This creates sharp shooting pains between the umbilicus and the incision.  No nausea, vomiting, or diarrhea.  No leg pain.  Current Facility-Administered Medications  Medication Dose Route Frequency Provider Last Rate Last Dose  . 0.9 %  sodium chloride infusion   Intravenous Continuous Dew, Erskine Squibb, MD      . Doug Sou Hold] acetaminophen (TYLENOL) tablet 650 mg  650 mg Oral Q4H PRN Ward, [name], MD       Or  . Doug Sou Hold] acetaminophen (TYLENOL) suppository 650 mg  650 mg Rectal Q4H PRN Ward, [name], MD      . Doug Sou Hold] diphenhydrAMINE (BENADRYL) injection 12.5 mg  12.5 mg Intravenous Q6H PRN Ward, [name], MD       Or  . Doug Sou Hold] diphenhydrAMINE (BENADRYL) 12.5 MG/5ML elixir 12.5 mg  12.5 mg Oral Q6H PRN Ward, [name], MD      . Doug Sou Hold] gabapentin (NEURONTIN) capsule 300 mg  300 mg Oral BID Ward, [name], MD   300 mg at 12/16/17 4098  . [MAR Hold] gabapentin (NEURONTIN) capsule 900 mg  900 mg Oral QHS Ward, [name], MD   900 mg at 12/15/17 2051  . [MAR Hold] HYDROmorphone (DILAUDID) 1 mg/mL PCA injection   Intravenous Q4H Ward, Chelsea C, MD      . lactated ringers infusion   Intravenous Continuous Ward, [name], MD 125 mL/hr at 12/16/17 1208    . [MAR Hold] naloxone (NARCAN) injection 0.4 mg  0.4 mg Intravenous PRN Ward, [name], MD       And  . Doug Sou Hold] sodium chloride flush (NS) 0.9 % injection 9 mL  9 mL Intravenous PRN Ward, [name], MD      . Doug Sou Hold]  ondansetron (ZOFRAN) injection 4 mg  4 mg Intravenous Q6H PRN Ward, [name], MD        Past Medical History:  Diagnosis Date  . Abnormal uterine bleeding   . ADHD (attention deficit hyperactivity disorder)   . Allergy   . Anxiety   . Bipolar 1 disorder ([place])   . Bipolar disorder ([place])   . DDD (degenerative disc disease)   . Depression   . HSV infection   . Hx: UTI (urinary tract infection)   . Pertussis   . Seizures ([place])    last seizure 12/2014   . Thyroid disease     Past Surgical History:  Procedure Laterality Date  . CERVICAL SPINE SURGERY     x 3 2001,2005,2007 c5-6  . CESAREAN SECTION  2003  . HYSTEROSCOPY N/A 10/11/2017   Procedure: HYSTEROSCOPY;  Surgeon: Harlin Heys, MD;  Location: [facility];  Service: Gynecology;  Laterality: N/A;  . HYSTEROSCOPY W/D&C N/A 10/11/2017   Procedure: DILATATION AND CURETTAGE /HYSTEROSCOPY;  Surgeon: Harlin Heys, MD;  Location: [facility];  Service: Gynecology;  Laterality: N/A;  . LAPAROSCOPIC BILATERAL SALPINGO OOPHERECTOMY Bilateral 12/15/2017   Procedure: LAPAROSCOPIC BILATERAL SALPINGO OOPHORECTOMY;  Surgeon:  Mellody Drown, MD;  Location: [facility];  Service: Gynecology;  Laterality: Bilateral;  . LAPAROSCOPIC HYSTERECTOMY Bilateral 12/15/2017   Procedure: HYSTERECTOMY TOTAL LAPAROSCOPIC;  Surgeon: Mellody Drown, MD;  Location: [facility];  Service: Gynecology;  Laterality: Bilateral;  . SENTINEL NODE BIOPSY N/A 12/15/2017   Procedure: SENTINEL NODE YSAYTK,ZSWFUXN;  Surgeon: Mellody Drown, MD;  Location: [facility];  Service: Gynecology;  Laterality: N/A;  . TONSILLECTOMY    . TUBAL LIGATION      Social History Social History   Tobacco Use  . Smoking status: Never Smoker  . Smokeless tobacco: Never Used  Substance Use Topics  . Alcohol use: Yes    Alcohol/week: 1.8 oz    Types: 3 Cans of beer per week    Comment: 1-3 drinks weeky  . Drug use: No    Comment: last used in 1990's    Family History Family  History  Problem Relation Age of Onset  . Cervical cancer Mother   . Cervical cancer Maternal Grandmother   . Polycythemia Maternal Grandmother   . Stomach cancer Father   . Colon cancer Paternal Grandmother   . Cervical cancer Cousin   . Cervical cancer Cousin   . Diabetes Mellitus II Neg Hx   . CAD Neg Hx   . Colon polyps Neg Hx   . Heart disease Neg Hx   . Rectal cancer Neg Hx     Allergies  Allergen Reactions  . Naproxen Anaphylaxis     REVIEW OF SYSTEMS (Negative unless checked)  Constitutional: [] Weight loss  [] Fever  [] Chills Cardiac: [] Chest pain   [] Chest pressure   [] Palpitations   [] Shortness of breath when laying flat   [] Shortness of breath at rest   [] Shortness of breath with exertion. Vascular:  [] Pain in legs with walking   [] Pain in legs at rest   [] Pain in legs when laying flat   [] Claudication   [] Pain in feet when walking  [] Pain in feet at rest  [] Pain in feet when laying flat   [] History of DVT   [] Phlebitis   [] Swelling in legs   [] Varicose veins   [] Non-healing ulcers Pulmonary:   [] Uses home oxygen   [] Productive cough   [] Hemoptysis   [] Wheeze  [] COPD   [] Asthma Neurologic:  [] Dizziness  [] Blackouts   [x] Seizures   [] History of stroke   [] History of TIA  [] Aphasia   [] Temporary blindness   [] Dysphagia   [] Weakness or numbness in arms   [] Weakness or numbness in legs Musculoskeletal:  [x] Arthritis   [] Joint swelling   [x] Joint pain   [] Low back pain Hematologic:  [] Easy bruising  [] Easy bleeding   [] Hypercoagulable state   [x] Anemic  [] Hepatitis Gastrointestinal:  [] Blood in stool   [] Vomiting blood  [] Gastroesophageal reflux/heartburn   [] Difficulty swallowing. X positive for abdominal pain Genitourinary:  [] Chronic kidney disease   [] Difficult urination  [] Frequent urination  [] Burning with urination   [] Blood in urine Skin:  [] Rashes   [] Ulcers   [] Wounds Psychological:  [x] History of anxiety   [x]  History of major depression.  Physical  Examination  Vitals:   12/16/17 0723 12/16/17 1100 12/16/17 1107 12/16/17 1355  BP:   106/73 (!) 87/73  Pulse:   100 (!) 102  Resp: 13 14 16 17   Temp:   98.4 F (36.9 C) 98.4 F (36.9 C)  TempSrc:   Oral Oral  SpO2: 99% 97% 97% 96%  Weight:    53.1 kg (117 lb)  Height:    5\' 1"  (  1.549 m)   Body mass index is 22.11 kg/m. Gen:  Thin, seems in discomfort Head: [place]/AT, No temporalis wasting.  Ear/Nose/Throat: Hearing grossly intact, nares w/o erythema or drainage, oropharynx w/o Erythema/Exudate Eyes: Sclera non-icteric, conjunctiva clear Neck: Trachea midline.  No JVD.  Pulmonary:  Good air movement, respirations not labored, equal bilaterally.  Cardiac: RRR Vascular:  Vessel Right Left  Radial Palpable Palpable                                   Gastrointestinal: soft, non-distended, tender on right side Musculoskeletal: M/S 5/5 throughout.  Extremities without ischemic changes.  No deformity or atrophy. No edema. Neurologic: Sensation grossly intact in extremities.  Symmetrical.  Speech is fluent. Motor exam as listed above. Psychiatric: Judgment intact, Mood & affect appropriate for pt's clinical situation. Dermatologic: No rashes or ulcers noted.  No cellulitis or open wounds.       CBC Lab Results  Component Value Date   WBC 13.6 (H) 12/16/2017   HGB 9.4 (L) 12/16/2017   HCT 28.3 (L) 12/16/2017   MCV 92.4 12/16/2017   PLT 234 12/16/2017    BMET    Component Value Date/Time   NA 139 12/15/2017 0638   K 3.9 12/15/2017 0638   CL 108 12/15/2017 0638   CO2 21 (L) 12/15/2017 0638   GLUCOSE 88 12/15/2017 0638   BUN 17 12/15/2017 0638   CREATININE 0.88 12/15/2017 0638   CALCIUM 9.3 12/15/2017 0638   GFRNONAA >60 12/15/2017 0638   GFRAA >60 12/15/2017 9977   Estimated Creatinine Clearance: 56.4 mL/min (by C-G formula based on SCr of 0.88 mg/dL).  COAG Lab Results  Component Value Date   INR 1.00 12/16/2017   INR 1.01 12/15/2017   INR 0.94  09/11/2016    Radiology Dg Chest 2 View  Result Date: 11/26/2017 CLINICAL DATA:  Preoperative examination. EXAM: CHEST  2 VIEW COMPARISON:  PA and lateral chest 09/11/2016. FINDINGS: The lungs are clear. Heart size is normal. No pneumothorax or pleural effusion. No focal bony abnormality. The patient is status post cervical fusion. Mild convex right scoliosis noted. IMPRESSION: No acute disease. Electronically Signed   By: Inge Rise M.D.   On: 11/26/2017 16:04      Assessment/Plan 1.  Abdominal wall hematoma post surgery.  This would most likely be an epigastric vessel.  I think it is reasonable to consider angiography to determine whether or not continued arterial bleeding is present.  If continued arterial bleeding is present, embolization would be planned.  If no active bleeding is seen, this may stop on its own 2. Dysfunctional uterine bleeding.  Hysterectomy addressed this.    Leotis Pain, MD  12/16/2017 2:02 PM    This note was created with Dragon medical transcription system.  Any error is purely unintentional

## 2017-12-16 NOTE — Progress Notes (Addendum)
Patient is still in recovery after embolization by Dr. Lucky Cowboy.   Spoke to Dr. Fransisca Connors, he is advising transfusion of 1-2 units of blood due to dramatic blood loss and hematoma formation.  Hb 9.4 down from 13.1 in PACU.  Results for orders placed or performed during the hospital encounter of 12/15/17 (from the past 24 hour(s))  CBC     Status: Abnormal   Collection Time: 12/16/17 12:20 PM  Result Value Ref Range   WBC 13.6 (H) 3.6 - 11.0 K/uL   RBC 3.06 (L) 3.80 - 5.20 MIL/uL   Hemoglobin 9.4 (L) 12.0 - 16.0 g/dL   HCT 28.3 (L) 35.0 - 47.0 %   MCV 92.4 80.0 - 100.0 fL   MCH 30.6 26.0 - 34.0 pg   MCHC 33.1 32.0 - 36.0 g/dL   RDW 13.8 11.5 - 14.5 %   Platelets 234 150 - 440 K/uL  Protime-INR     Status: None   Collection Time: 12/16/17 12:20 PM  Result Value Ref Range   Prothrombin Time 13.1 11.4 - 15.2 seconds   INR 1.00     1 unit ordered to follow with H/H.   Nursing notified.  Will follow up.  ----- Larey Days, MD Attending Obstetrician and Gynecologist [facility], Department of [facility]  >30 minutes were spent in consultation with Dr. Fransisca Connors and Dr. Lucky Cowboy.

## 2017-12-17 ENCOUNTER — Encounter: Payer: Self-pay | Admitting: Vascular Surgery

## 2017-12-17 DIAGNOSIS — D62 Acute posthemorrhagic anemia: Secondary | ICD-10-CM | POA: Diagnosis present

## 2017-12-17 DIAGNOSIS — N99820 Postprocedural hemorrhage and hematoma of a genitourinary system organ or structure following a genitourinary system procedure: Secondary | ICD-10-CM

## 2017-12-17 LAB — BPAM RBC
Blood Product Expiration Date: 201902272359
ISSUE DATE / TIME: 201902211724
UNIT TYPE AND RH: 600

## 2017-12-17 LAB — HEMOGLOBIN AND HEMATOCRIT, BLOOD
HEMATOCRIT: 27.4 % — AB (ref 35.0–47.0)
HEMOGLOBIN: 9.4 g/dL — AB (ref 12.0–16.0)

## 2017-12-17 LAB — TYPE AND SCREEN
ABO/RH(D): A POS
Antibody Screen: NEGATIVE
Unit division: 0

## 2017-12-17 MED ORDER — OXYCODONE HCL 10 MG PO TABS: 10.0000 mg | ORAL_TABLET | ORAL | 0 refills | Status: DC

## 2017-12-17 MED ORDER — GABAPENTIN 300 MG PO CAPS: 300.0000 mg | ORAL_CAPSULE | Freq: Three times a day (TID) | ORAL | 3 refills | Status: DC

## 2017-12-17 MED ORDER — OXYCODONE HCL 5 MG PO TABS
10.0000 mg | ORAL_TABLET | ORAL | Status: DC | PRN
  Administered 2017-12-17 (×3): 10 mg via ORAL
  Filled 2017-12-17 (×3): qty 2

## 2017-12-17 MED ORDER — LIDOCAINE 5 % EX PTCH: 1.0000 | MEDICATED_PATCH | CUTANEOUS | 0 refills | Status: DC

## 2017-12-17 NOTE — Progress Notes (Signed)
Dc to home after instructions

## 2017-12-17 NOTE — Care Management Important Message (Signed)
Important Message  Patient Details  Name: [name] MRN: [id number] Date of Birth: February 05, 1966   Medicare Important Message Given:  Yes    Shelbie Ammons, RN 12/17/2017, 7:33 AM

## 2017-12-17 NOTE — Discharge Instructions (Signed)
Discharge instructions:  Call office if you have any of the following: fever >101 F, chills, excessive vaginal bleeding, incision drainage or problems, leg pain or redness, or any other concerns.   Activity: Do not lift > 10 lbs for 8 weeks.  No intercourse or tampons for 8 weeks.  No driving for 1-2 weeks.   You may feel some pain in your upper right abdomen/rib and right shoulder.  This is from the gas in the abdomen for surgery. This will subside over time, please be patient!  You will have some prominent bruising.  These will be sore for several weeks, but will eventually resolve.  Again, please be patient.    Take 1000mg  Tylenol around the clock, every 6 hours for at least the first 3-5 days.  After this you can take as needed.  This will help decrease inflammation and promote healing.  The narcotics you'll take just as needed, as they just trick your brain into thinking its not in pain.   Take 300mg  of Gabapentin in the morning and afternoon, and 900mg  before bed.    Please don't limit yourself in terms of routine activity.  You will be able to do most things, although they may take longer to do or be a little painful.  You can do it!  Don't be a hero, but don't be a wimp either!   Also, please be mindful that you just had surgery which cut major and minor blood vessels.  Engaging in illicit drug use while these sites are healing will delay their healing and put you at high risk for internal bleeding and other complications.  Please refrain from this activity.     Laparoscopically Assisted Vaginal Hysterectomy, Care After Refer to this sheet in the next few weeks. These instructions provide you with information on caring for yourself after your procedure. Your health care provider may also give you more specific instructions. Your treatment has been planned according to current medical practices, but problems sometimes occur. Call your health care provider if you have any problems or  questions after your procedure. What can I expect after the procedure? After your procedure, it is typical to have the following:  Abdominal pain. You will be given pain medicine to control it.  Sore throat from the breathing tube that was inserted during surgery.  Follow these instructions at home:  Only take over-the-counter or prescription medicines for pain, discomfort, or fever as directed by your health care provider.  Do not take aspirin. It can cause bleeding.  Do not drive when taking pain medicine.  Follow your health care provider's advice regarding diet, exercise, lifting, driving, and general activities.  Resume your usual diet as directed and allowed.  Get plenty of rest and sleep.  Do not douche, use tampons, or have sexual intercourse for at least 6 weeks, or until your health care provider gives you permission.  Change your bandages (dressings) as directed by your health care provider.  Monitor your temperature and notify your health care provider of a fever.  Take showers instead of baths for 2-3 weeks.  Do not drink alcohol until your health care provider gives you permission.  If you develop constipation, you may take a mild laxative with your health care provider's permission. Bran foods may help with constipation problems. Drinking enough fluids to keep your urine clear or pale yellow may help as well.  Try to have someone home with you for 1-2 weeks to help around the house.  Keep all of your follow-up appointments as directed by your health care provider. Contact a health care provider if:  You have swelling, redness, or increasing pain around your incision sites.  You have pus coming from your incision.  You notice a bad smell coming from your incision.  Your incision breaks open.  You feel dizzy or lightheaded.  You have pain or bleeding when you urinate.  You have persistent diarrhea.  You have persistent nausea and vomiting.  You have  abnormal vaginal discharge.  You have a rash.  You have any type of abnormal reaction or develop an allergy to your medicine.  You have poor pain control with your prescribed medicine. Get help right away if:  You have a fever.  You have severe abdominal pain.  You have chest pain.  You have shortness of breath.  You faint.  You have pain, swelling, or redness in your leg.  You have heavy vaginal bleeding with blood clots. This information is not intended to replace advice given to you by your health care provider. Make sure you discuss any questions you have with your health care provider. Document Released: 10/01/2011 Document Revised: 03/19/2016 Document Reviewed: 04/27/2013 Elsevier Interactive Patient Education  2017 [employer].

## 2017-12-17 NOTE — Progress Notes (Signed)
[facility]  Daily Progress Note   Subjective: 1 Day Post-Op: Ultrasound guidance for vascular access left femoral artery, Catheter placement into medial and lateral inferior epigastric artery branches on the right, Aortogram and selective angiogram of the medial and lateral inferior epigastric arteries, Coil embolization of the lateral inferior epigastric artery branch with three 2 mm coils and one 3 mm coil, Coil embolization of the medial inferior epigastric artery branch with one 2 mm coil and three 3 mm coils with StarClose closure device left femoral artery.   Patient resting in bed with ice pack to abdomen. Patient notes generalized soreness to the abdomen. Patient denies any nausea or vomiting.   Objective: Vitals:   12/17/17 0350 12/17/17 0350 12/17/17 0838 12/17/17 0900  BP:  (!) 99/56 (!) 103/49   Pulse:  89 97   Resp: 16 16 18 16   Temp:  98.1 F (36.7 C) 98.4 F (36.9 C)   TempSrc:  Oral Oral   SpO2: 99% 98% 100% 98%  Weight:      Height:        Intake/Output Summary (Last 24 hours) at 12/17/2017 1042 Last data filed at 12/17/2017 1039 Gross per 24 hour  Intake 5431.96 ml  Output 3250 ml  Net 2181.96 ml   Physical Exam: A&Ox3, NAD CV: RRR Pulmonary: CTA Bilaterally Abdomen: Soft, Mildly distended, mildly tender to palpation. Lap sites with ecchymosis noted.  Groin: Left - Procedure dressing in place - minimal drainage. No swelling. Vascular: Bilateral lower extremity non-tender, warm, minimal edema   Laboratory: CBC    Component Value Date/Time   WBC 13.6 (H) 12/16/2017 1220   HGB 9.4 (L) 12/16/2017 2354   HCT 27.4 (L) 12/16/2017 2354   PLT 234 12/16/2017 1220   BMET    Component Value Date/Time   NA 139 12/15/2017 0638   K 3.9 12/15/2017 0638   CL 108 12/15/2017 0638   CO2 21 (L) 12/15/2017 0638   GLUCOSE 88 12/15/2017 0638   BUN 17 12/15/2017 0638   CREATININE 0.88 12/15/2017 0638   CALCIUM 9.3 12/15/2017 0638   GFRNONAA >60  12/15/2017 0638   GFRAA >60 12/15/2017 3151   Assessment/Planning: The patient is a 52 year old female with epigastric artery bleeding after hysterectomy s/p embolization of inferior epigastric artery x2 - stable 1) Post procedure Hbg stable 2) 500cc urine since beginning of shift 3) Vitals stable 4) Vascular Surgery will sign off for now, please re-consult if necessary.  5) Patient does not need to follow up with vascular surgery as an outpatient as Dr. [facility] PA-C 12/17/2017 10:42 AM

## 2017-12-17 NOTE — Discharge Summary (Addendum)
Gynecology Physician Postoperative Discharge Summary  Patient ID: MERTIS MOSHER MRN: [id number] DOB/AGE: 03-09-1966 52 y.o.  Admit Date: 12/15/2017 Discharge Date: 12/17/2017  Preoperative Diagnoses: high risk for endometrial cancer, typical glandular proliferation of the endometrium Postoperative Diagnoses: same  Procedures: Procedure(s) (LRB): EMBOLIZATION (N/A)  -Total laparoscopic Hysterectomy, bilateral salpingo-oohporectomy -right epigastric embolization  CBC Latest Ref Rng & Units 12/16/2017 12/16/2017 12/15/2017  WBC 3.6 - 11.0 K/uL - 13.6(H) 23.3(H)  Hemoglobin 12.0 - 16.0 g/dL 9.4(L) 9.4(L) 13.1  Hematocrit 35.0 - 47.0 % 27.4(L) 28.3(L) 38.4  Platelets 150 - 440 K/uL - 234 287    Hospital Course:  NYGERIA LAGER is a 52 y.o. [id number]  admitted for scheduled surgery.  She underwent the procedures as mentioned above, her operation was uncomplicated. For further details about surgery, please refer to the operative report. In the PACU it was noted that she developed an incisional hematoma in the RLQ.  This spread over the next 12 hours and vascular surgery was consulted, and IR embolization of the branches of the right inferior epigastric artery was performed.  She tolerated this well.  She was transitioned off a PCA and tolerated her pain with oral pain medications on POD 2. By time of discharge she was ambulating, voiding without difficulty, tolerating regular diet and passing flatus. She was deemed stable for discharge to home.   Discharge Exam: Blood pressure (!) 113/58, pulse 98, temperature 98 F (36.7 C), temperature source Oral, resp. rate 18, height 5\' 1"  (1.549 m), weight 53.1 kg (117 lb), last menstrual period 09/22/2017, SpO2 99 %. General appearance: alert and no distress  Resp: clear to auscultation bilaterally, normal respiratory effort Cardio: regular rate and rhythm  NL:ZJQBH sounds normal; no masses, no organomegaly. Significant hematoma formation across  low abdomen with bruising diffusely throughout abdomen, flank, and low back. Incision: C/D/I, no erythema, no drainage noted.   Pelvic: scant blood on pad  Extremities: extremities normal, atraumatic, no cyanosis or edema and Homans sign is negative, no sign of DVT  Discharged Condition: Stable  Disposition: 01-Home or Self Care   Allergies as of 12/17/2017      Reactions   Naproxen Anaphylaxis      Medication List    TAKE these medications   5-HTP 100 MG Caps Take 100 mg by mouth daily.   acetaminophen 500 MG tablet Commonly known as:  TYLENOL Take 500-1,000 mg by mouth every 6 (six) hours as needed for mild pain or headache.   acyclovir 400 MG tablet Commonly known as:  ZOVIRAX Take 400 mg by mouth twice daily   amphetamine-dextroamphetamine 20 MG 24 hr capsule Commonly known as:  ADDERALL XR Take 20 mg by mouth once daily   aspirin 81 MG EC tablet Take 1 tablet (81 mg total) by mouth daily.   BC HEADACHE POWDER PO Take 1 packet by mouth daily as needed (for pain or headache).   cholecalciferol 1000 units tablet Commonly known as:  VITAMIN D Take 1,000 Units by mouth daily.   clonazePAM 1 MG tablet Commonly known as:  KLONOPIN Take 1 mg by mouth at bedtime   Cranberry 1000 MG Caps Take 1,000 mg by mouth daily.   cyclobenzaprine 5 MG tablet Commonly known as:  FLEXERIL Take 5 mg by mouth 3 (three) times daily as needed for muscle spasms.   DRY EYES OP Place 1 drop into both eyes daily as needed (for dry eyes).   DULoxetine 20 MG capsule Commonly known as:  CYMBALTA Take  40 mg by mouth daily.   EPIPEN 2-PAK 0.3 mg/0.3 mL Soaj injection Generic drug:  EPINEPHrine Inject 0.3 mg into the muscle once as needed (severe allergic reaction).   gabapentin 300 MG capsule Commonly known as:  NEURONTIN Take 1 capsule (300 mg total) by mouth 3 (three) times daily. 300mg  morning, afternoon and 900mg  before bed   lamoTRIgine 100 MG tablet Commonly known as:   LAMICTAL Take 100 mg by mouth every morning.   lidocaine 5 % Commonly known as:  LIDODERM Place 1 patch onto the skin daily. Remove & Discard patch within 12 hours or as directed by MD   Magnesium 200 MG Tabs Take 200 mg by mouth every morning.   milk thistle 175 MG tablet Take 175 mg by mouth 2 (two) times daily.   Oxycodone HCl 10 MG Tabs Take 1 tablet (10 mg total) by mouth every 4 (four) hours.   RELISTOR 150 MG Tabs Generic drug:  Methylnaltrexone Bromide Take 450 mg by mouth daily-MORNING   traMADol 50 MG tablet Commonly known as:  ULTRAM Take 1 tablet (50 mg total) by mouth every 6 (six) hours as needed.   traZODone 100 MG tablet Commonly known as:  DESYREL Take 1 tablet (100 mg total) by mouth at bedtime as needed for sleep.   vitamin C 1000 MG tablet Take 1,000 mg by mouth daily.      Follow-up Information    Mellody Drown, MD Follow up in 1 week(s).   Specialty:  Obstetrics and Gynecology Why:  Beckey Rutter, NP at [facility] information: [facility] [place] [id number] [phone]        Harlin Heys, MD Follow up in 6 week(s).   Specialty:  Obstetrics and Gynecology Contact information: [address] [place] [address] [phone]           Signed:  [place] Attending [facility]  An additional 60 minutes were spent in evaluation and discussing follow up, plans, etc with patient at time of discharge.  All of it was face to face.

## 2017-12-20 ENCOUNTER — Telehealth: Payer: Self-pay | Admitting: *Deleted

## 2017-12-20 ENCOUNTER — Other Ambulatory Visit: Payer: Self-pay | Admitting: Nurse Practitioner

## 2017-12-20 MED ORDER — LINACLOTIDE 145 MCG PO CAPS: 145.0000 ug | ORAL_CAPSULE | Freq: Every day | ORAL | 0 refills | Status: DC

## 2017-12-20 NOTE — Telephone Encounter (Signed)
Patient called asking for Lauren to call her back stating she is in horrible pain and the medicine is NOT helping. Her return number is [phone]

## 2017-12-20 NOTE — Telephone Encounter (Signed)
Lauren spoke with patient

## 2017-12-20 NOTE — Progress Notes (Signed)
Spoke to patient regarding continued pain. States she has not had a bowel movement since surgery. States she is passing gas. No nausea or vomiting. States she feels like she needs to go. Is not able to come to clinic for evaluation d/t transportation. Has suppository and senna at home which she can use. Will call in [place] to pharmacy. Encouraged walking and intake of water. Discussed with Dr. Fransisca Connors who affirms, low suspicion for bowel obstruction at this point and to treat underlying constipation then reassess pain. Patient agrees with this course, all questions answered.

## 2017-12-21 NOTE — Progress Notes (Signed)
Gynecologic Oncology Interval Visit   Referring Provider: Harlin Heys,  MD [address] [place], [place] [address] [phone]   Chief Complaint: postop visit for pain management  Subjective:  [name] is a 52 y.o. female who is seen in consultation from Dr. Amalia Hailey for endometrial adenocarcinoma.   She underwent TLH, BSO, with sentinel lymph node mapping and biopsies on 12/15/2017. Postoperatively she had an right epigastric artery bleed that was embolized. She was discharged on 12/17/2017. She has significant pain requirements (see med list). She was released from the Pain Clinic after positive cocaine testing. She has 8/10 pain that is constant - improved to 5/10 with pain medication. She is very tearful coping with postoperative, pain, the loss of her mother, and she has a h/o Bipolar Disorder.   DIAGNOSIS:  A. UTERUS WITH CERVIX; HYSTERECTOMY:  - CERVIX WITHOUT PATHOLOGIC CHANGES.  - INACTIVE ENDOMETRIUM WITH FOCALLY PROMINENT CILIATED CELLS.  - NO RESIDUAL ENDOMETRIAL ATYPICAL GLANDULAR PROLIFERATION (ENTIRE  ENDOMETRIUM SUBMITTED FOR HISTOLOGY).  - ADENOMYOSIS.   BILATERAL FALLOPIAN TUBES AND OVARIES; SALPINGO-OOPHORECTOMY:  - HEMORRHAGIC CORPUS ALBICANS, LEFT OVARY.  - BILATERAL OVARIAN CORTICAL INCLUSION CYSTS.  - RIGHT PARATUBAL CYSTS.   B. SENTINEL LYMPH NODE, LEFT ILIAC; EXCISION:  - NEGATIVE FORMALIGNANCY, ONE LYMPH NODE.   C. SENTINEL LYMPH NODE, RIGHT EXTERNAL ILIAC; EXCISION:  - NEGATIVE FOR MALIGNANCY, ONE LYMPH NODE.  DIAGNOSIS:  A. PELVIC WASHINGS:  - NEGATIVE FOR MALIGNANCY.  - MACROPHAGES, MESOTHELIAL CELLS, AND LYMPHOCYTES.    Gyn-Onc History [name] is a pleasant female who is seen in consultation from Dr. Amalia Hailey for endometrial adenocarcinoma.   09/08/17 US Pelvic/transvaginal ultrasound showed Endometrium measuring 8.2 mm with fluid noted in endometrial canal. Left ovarian cyst measuring 3.5 x 2.8 x 2.4 cm.  Right ovary was not visualized due to overlying bowel gas. Uterus measured 9.9 x 7.4 x 5.1 cm without evidence of focal masses. Echo texture was homogenous without evidence of focal masses.    09/09/17 Diagnosis:  ENDOMETRIUM, POLYP:  FRAGMENTS OF BENIGN ENDOMETRIAL POLYP, INFARCTED, AND WEAKLY  PROLIFERATIVE ENDOMETRIUM, MIXED WITH MINUTE FRAGMENTS OF BENIGN  ENDOCERVICAL GLANDS, SQUAMOUS EPITHELIUM, BLOOD, AND MUCUS. NO  HYPERPLASIA OR CARCINOMA.    10/11/17 Hysteroscopy/D&C w/ Dr. Amalia Hailey DIAGNOSIS:  A. ENDOCERVIX; CURETTAGE:  - MICROGLANDULAR HYPERPLASIA.  - ACUTE INFLAMMATION.  - NEGATIVE FOR ATYPIA AND MALIGNANCY  B. ENDOMETRIUM; CURETTAGE:  - ATYPICAL GLANDULAR PROLIFERATION, HIGHLY SUSPICIOUS FOR ENDOMETRIOID  ADENOCARCINOMA WITH PARTIAL PROGESTIN TREATMENT EFFECT, SEE COMMENT.    She opted for surgical management.   Problem List: Patient Active Problem List   Diagnosis Date Noted  . 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 Hypotension 09/12/2016  . Right facial numbness 09/11/2016  . Stroke-like symptom 09/11/2016  . Seizure ([place]) 09/11/2016  . Hypothyroidism 09/11/2016  . Stroke-like symptoms 09/11/2016  . Fall   . Syncope 11/20/2015  . Scalp laceration 11/20/2015  . Major depressive disorder, recurrent severe without psychotic features ([place]) 03/23/2015  . DDD (degenerative disc disease)   . ACUTE BRONCHITIS 10/22/2009  . GERD 08/02/2009  . DEGENERATIVE DISC DISEASE 08/02/2009  . COUGH 08/02/2009  . Bipolar disorder ([place]) 08/01/2009  . Alcoholism ([place]) 08/01/2009  . BULIMIA 08/01/2009  . HEARING LOSS, BILATERAL 08/01/2009  . OSTEOPENIA 08/01/2009  . SEIZURE DISORDER 08/01/2009    Past Medical History: Past Medical History:  Diagnosis Date  . Abnormal uterine bleeding   . ADHD (attention deficit hyperactivity disorder)   . Allergy   . Anxiety   .  Bipolar 1 disorder ([place])   . Bipolar disorder ([place])   . DDD (degenerative disc disease)   .  Depression   . HSV infection   . Hx: UTI (urinary tract infection)   . Pertussis   . Seizures ([place])    last seizure 12/2014   . Thyroid disease     Past Surgical History: Past Surgical History:  Procedure Laterality Date  . CERVICAL SPINE SURGERY     x 3 2001,2005,2007 c5-6  . CESAREAN SECTION  2003  . HYSTEROSCOPY N/A 10/11/2017   Procedure: HYSTEROSCOPY;  Surgeon: Harlin Heys, MD;  Location: [facility] ORS;  Service: Gynecology;  Laterality: N/A;  . HYSTEROSCOPY W/D&C N/A 10/11/2017   Procedure: DILATATION AND CURETTAGE /HYSTEROSCOPY;  Surgeon: Harlin Heys, MD;  Location: [facility] ORS;  Service: Gynecology;  Laterality: N/A;  . TONSILLECTOMY    . TUBAL LIGATION      Past Gynecologic History:  As per HPI  OB History:  OB History  Gravida Para Term Preterm AB Living  5 4 3 1 1 4   SAB TAB Ectopic Multiple Live Births          4    # Outcome Date GA Lbr Len/2nd Weight Sex Delivery Anes PTL Lv  5 Preterm 2003    M CS-Unspec   LIV  4 Term 1998    F Vag-Spont  N LIV  3 Term 1990    M Vag-Spont  N LIV  2 Term 1988    F Vag-Spont  N LIV  1 AB 1986              Family History: Family History  Problem Relation Age of Onset  . Cervical cancer Mother   . Cervical cancer Maternal Grandmother   . Polycythemia Maternal Grandmother   . Stomach cancer Father   . Colon cancer Paternal Grandmother   . Cervical cancer Cousin   . Cervical cancer Cousin   . Diabetes Mellitus II Neg Hx   . CAD Neg Hx   . Colon polyps Neg Hx   . Heart disease Neg Hx   . Rectal cancer Neg Hx     Social History: Social History   Socioeconomic History  . Marital status: Legally Separated    Spouse name: Not on file  . Number of children: 4  . Years of education: Not on file  . Highest education level: Not on file  Social Needs  . Financial resource strain: Not on file  . Food insecurity - worry: Not on file  . Food insecurity - inability: Not on file  . Transportation needs - medical:  Not on file  . Transportation needs - non-medical: Not on file  Occupational History  . Not on file  Tobacco Use  . Smoking status: Never Smoker  . Smokeless tobacco: Never Used  Substance and Sexual Activity  . Alcohol use: Yes    Alcohol/week: 1.8 oz    Types: 3 Cans of beer per week    Comment: 1-3 drinks weeky  . Drug use: No    Comment: last used in 1990's  . Sexual activity: Yes    Birth control/protection: Surgical  Other Topics Concern  . Not on file  Social History Narrative  . Not on file    Allergies: Allergies  Allergen Reactions  . Naproxen Anaphylaxis    Current Medications: Current Outpatient Medications  Medication Sig Dispense Refill  . 5-Hydroxytryptophan (5-HTP) 100 MG CAPS Take 1 capsule by mouth daily.    Marland Kitchen  acetaminophen (TYLENOL) 500 MG tablet Take 500-1,000 mg by mouth every 6 (six) hours as needed for mild pain.     Marland Kitchen acyclovir (ZOVIRAX) 400 MG tablet TK 1 T PO BID  9  . amphetamine-dextroamphetamine (ADDERALL XR) 20 MG 24 hr capsule TK 1 C PO QAM  0  . Ascorbic Acid (VITAMIN C) 1000 MG tablet Take 1,000 mg by mouth daily.    Marland Kitchen aspirin EC 81 MG EC tablet Take 1 tablet (81 mg total) by mouth daily. 30 tablet 0  . Aspirin-Salicylamide-Caffeine (BC HEADACHE POWDER PO) Take 2 Doses by mouth daily.    . cholecalciferol (VITAMIN D) 1000 units tablet Take 1,000 Units by mouth daily.    . clonazePAM (KLONOPIN) 1 MG tablet TK 1 T PO QD PRA  2  . Cranberry 1000 MG CAPS Take 1 capsule by mouth daily.    . cyclobenzaprine (FLEXERIL) 5 MG tablet Take 5 mg by mouth 3 (three) times daily.  2  . EPINEPHrine (EPIPEN 2-PAK) 0.3 mg/0.3 mL IJ SOAJ injection Inject 0.3 mg into the muscle once as needed (severe allergic reaction).    Marland Kitchen lamoTRIgine (LAMICTAL) 100 MG tablet Take 100 mg by mouth daily.     . Magnesium 200 MG TABS Take 200 mg by mouth daily.    . milk thistle 175 MG tablet Take 350 mg by mouth daily.    . Oxycodone HCl 10 MG TABS Take 10 mg by mouth every  4 (four) hours as needed.     Marland Kitchen RELISTOR 150 MG TABS TK 3 TS PO QD IN THE MORNING  2  . traZODone (DESYREL) 100 MG tablet Take 1 tablet (100 mg total) by mouth at bedtime as needed for sleep. 30 tablet 0  . traMADol (ULTRAM) 50 MG tablet Take 1 tablet (50 mg total) by mouth every 6 (six) hours as needed. 20 tablet 0  . valACYclovir (VALTREX) 500 MG tablet Take 500 mg by mouth 2 (two) times daily.     No current facility-administered medications for this visit.     Review of Systems General: General: positive for fatigue  HEENT: positive for hearing loss  Lungs: no complaints  Cardiac: no complaints  GI: positive for nausea, constipation  GU: no bleeding, pain, or discharge; "fluttering in groin"  Musculoskeletal: positive for joint and back pain  Extremities: pain in legs  Skin: bruising and swelling  Neuro: positive for headaches  Endocrine: negative  Psych: positive for anxiety history         Objective:  Physical Examination:  Vitals:   12/22/17 1528  BP: 103/69  Pulse: (!) 101  Resp: 18  Temp: 98.5 F (36.9 C)   Body mass index is 22.11 kg/m.    ECOG Performance Status: 0 - Asymptomatic  General appearance: appears older than stated age, in no apparent distress HEENT: ATNC Cardiovascular: RRR Respiratory: B CTA. Abdomen: SND but tender in the BLQ. Incision all well healed. Diffuse echymosis R>L flank.  Extremities:No edema Skin exam - Well healed C-section incision and as above Neurological exam reveals alert, oriented, normal speech, no focal findings or movement disorder noted  Pelvic: deferred  Labs Lab Results  Component Value Date   WBC 13.6 (H) 12/16/2017   HGB 9.4 (L) 12/16/2017   HCT 27.4 (L) 12/16/2017   MCV 92.4 12/16/2017   PLT 234 12/16/2017       Assessment:  ELAN MCELVAIN is a 52 y.o. female diagnosed with pathology suspicious for endometrial adenocarcinoma, s/p  TLH BSO and sentinel nodes with negative pathology for  malignancy. Adenomyosis. Right epigastric artery bleed s/p embolization, continues to improve.   Significant postoperative pain, challenging to control given narcotic history.  Medical co-morbidities complicating care: prior surgery; seizure disorder, hypothyroidism, major depressive disorder, recurrent severe without psychotic features.   Plan:   Problem List Items Addressed This Visit      Genitourinary   Glandular endometrial hyperplasia - Primary      We discussed pathology findings. There is no evidence of malignancy which is wonderful news.   We will try to optimize pain control by adding Lidoderm patch; continue oxycodone and refilled meds for one week; she will also continue other analgesics. We strongly recommend she reach out to her Pain Management to re-establish care.   We also recommended she follow up with her Mental Health counselor for Bipolar Disorder and Grief counseling.   Check CBC next visit.   I personally reviewed the patient's history, completed key elements of her exam, and was involved in decision making in conjunction with Ms. Allen.   Gillis Ends, MD   CC:  Harlin Heys,  MD [address] [place], [place] [address] [phone]

## 2017-12-22 ENCOUNTER — Other Ambulatory Visit: Payer: Self-pay | Admitting: Nurse Practitioner

## 2017-12-22 ENCOUNTER — Inpatient Hospital Stay: Payer: Medicare HMO | Attending: Obstetrics and Gynecology | Admitting: Obstetrics and Gynecology

## 2017-12-22 VITALS — BP 103/69 | HR 101 | Temp 98.5°F | Resp 18 | Ht 61.0 in

## 2017-12-22 DIAGNOSIS — Z90722 Acquired absence of ovaries, bilateral: Secondary | ICD-10-CM | POA: Insufficient documentation

## 2017-12-22 DIAGNOSIS — Z90710 Acquired absence of both cervix and uterus: Secondary | ICD-10-CM | POA: Insufficient documentation

## 2017-12-22 DIAGNOSIS — N8500 Endometrial hyperplasia, unspecified: Secondary | ICD-10-CM | POA: Insufficient documentation

## 2017-12-22 DIAGNOSIS — T148XXA Other injury of unspecified body region, initial encounter: Secondary | ICD-10-CM

## 2017-12-22 MED ORDER — OXYCODONE HCL 10 MG PO TABS: 10.0000 mg | ORAL_TABLET | ORAL | 0 refills | Status: AC

## 2017-12-22 MED ORDER — LIDOCAINE 5 % EX PTCH: 1.0000 | MEDICATED_PATCH | CUTANEOUS | 0 refills | Status: DC

## 2017-12-22 NOTE — Progress Notes (Signed)
Patient c/o lower abdomen pain ( pain level 8)

## 2017-12-23 ENCOUNTER — Telehealth: Payer: Self-pay | Admitting: Nurse Practitioner

## 2017-12-23 DIAGNOSIS — F329 Major depressive disorder, single episode, unspecified: Secondary | ICD-10-CM | POA: Diagnosis not present

## 2017-12-23 DIAGNOSIS — T797XXA Traumatic subcutaneous emphysema, initial encounter: Secondary | ICD-10-CM | POA: Diagnosis not present

## 2017-12-23 DIAGNOSIS — S301XXA Contusion of abdominal wall, initial encounter: Secondary | ICD-10-CM | POA: Diagnosis not present

## 2017-12-23 DIAGNOSIS — F319 Bipolar disorder, unspecified: Secondary | ICD-10-CM | POA: Diagnosis not present

## 2017-12-23 DIAGNOSIS — R109 Unspecified abdominal pain: Secondary | ICD-10-CM | POA: Diagnosis not present

## 2017-12-23 DIAGNOSIS — R1031 Right lower quadrant pain: Secondary | ICD-10-CM | POA: Diagnosis not present

## 2017-12-23 DIAGNOSIS — R569 Unspecified convulsions: Secondary | ICD-10-CM | POA: Diagnosis not present

## 2017-12-23 DIAGNOSIS — T8182XA Emphysema (subcutaneous) resulting from a procedure, initial encounter: Secondary | ICD-10-CM | POA: Diagnosis not present

## 2017-12-23 DIAGNOSIS — R1032 Left lower quadrant pain: Secondary | ICD-10-CM | POA: Diagnosis not present
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
## 2017-12-24 ENCOUNTER — Telehealth: Payer: Self-pay | Admitting: *Deleted

## 2017-12-24 DIAGNOSIS — T148XXA Other injury of unspecified body region, initial encounter: Secondary | ICD-10-CM

## 2017-12-24 DIAGNOSIS — R110 Nausea: Secondary | ICD-10-CM

## 2017-12-24 MED ORDER — ONDANSETRON HCL 4 MG PO TABS: 4.0000 mg | ORAL_TABLET | Freq: Three times a day (TID) | ORAL | 0 refills | Status: DC | PRN

## 2017-12-24 NOTE — Telephone Encounter (Signed)
Patient called and apologizes for her outburst yesterday and is reporting that she is having diarrhea and nausea today. Asking that Lauren return her call regarding what to do. Did not mention what the outcome of her trip to [facility] ER was or that she even went there. Per Care Everywhere CT was done and post surgical changes were seen and she was released from [facility]. They gave her prescription for #12 Oxycodone 5 mg tabs.

## 2017-12-29 ENCOUNTER — Inpatient Hospital Stay: Payer: Medicare HMO | Attending: Obstetrics and Gynecology | Admitting: Obstetrics and Gynecology

## 2017-12-29 ENCOUNTER — Inpatient Hospital Stay: Payer: Medicare HMO

## 2017-12-29 VITALS — BP 105/69 | HR 99 | Temp 97.1°F | Resp 18 | Ht 61.0 in | Wt 114.4 lb

## 2017-12-29 DIAGNOSIS — R300 Dysuria: Secondary | ICD-10-CM

## 2017-12-29 DIAGNOSIS — T148XXA Other injury of unspecified body region, initial encounter: Secondary | ICD-10-CM

## 2017-12-29 LAB — URINALYSIS, COMPLETE (UACMP) WITH MICROSCOPIC
BILIRUBIN URINE: NEGATIVE
Bacteria, UA: NONE SEEN
Glucose, UA: NEGATIVE mg/dL
HGB URINE DIPSTICK: NEGATIVE
Ketones, ur: NEGATIVE mg/dL
NITRITE: NEGATIVE
PH: 6 (ref 5.0–8.0)
Protein, ur: NEGATIVE mg/dL
SPECIFIC GRAVITY, URINE: 1.018 (ref 1.005–1.030)

## 2017-12-29 LAB — CBC WITH DIFFERENTIAL/PLATELET
BASOS ABS: 0.1 10*3/uL (ref 0–0.1)
BASOS PCT: 1 %
EOS ABS: 0.1 10*3/uL (ref 0–0.7)
EOS PCT: 0 %
HCT: 32 % — ABNORMAL LOW (ref 35.0–47.0)
Hemoglobin: 10.8 g/dL — ABNORMAL LOW (ref 12.0–16.0)
Lymphocytes Relative: 14 %
Lymphs Abs: 2.1 10*3/uL (ref 1.0–3.6)
MCH: 30.9 pg (ref 26.0–34.0)
MCHC: 33.7 g/dL (ref 32.0–36.0)
MCV: 91.7 fL (ref 80.0–100.0)
Monocytes Absolute: 0.8 10*3/uL (ref 0.2–0.9)
Monocytes Relative: 6 %
NEUTROS ABS: 12.1 10*3/uL — AB (ref 1.4–6.5)
Neutrophils Relative %: 79 %
PLATELETS: 701 10*3/uL — AB (ref 150–440)
RBC: 3.49 MIL/uL — ABNORMAL LOW (ref 3.80–5.20)
RDW: 14.5 % (ref 11.5–14.5)
WBC: 15.1 10*3/uL — ABNORMAL HIGH (ref 3.6–11.0)

## 2017-12-29 NOTE — Progress Notes (Signed)
Gynecologic Oncology Interval Visit   Referring Provider: Harlin Heys,  MD [address] [place], [place] [address] [phone]   Chief Complaint: postop visit for pain management  Subjective:  [name] is a 52 y.o. female who is seen in consultation from Dr. Amalia Hailey for endometrial adenocarcinoma.   She underwent TLH, BSO, with sentinel lymph node mapping and biopsies on 12/15/2017. Postoperatively she had an right epigastric artery bleed that was embolized. She was discharged on 12/17/2017. She has significant pain requirements (see med list). She was released from the Pain Clinic after positive cocaine testing. She was seen in clinic on 2/27 for re-evaluation and pain management. She was seen at [facility] ED due to post op pain on 12/23/17. CT showed post-surgical changes. She was provided with 2 days of pain medication and discharged to follow up with primary care and surgery.   Today, she rates continues to complain of pain 8/10 similar to her baseline but reports overall 'feeling better'. She has not made an appointment with Pain Management and has not seen Counseling. She continues to be tearful at times.   We reviewed her pathology again as noted below and reiterated that she does not have endometrial cancer.   DIAGNOSIS:  A. UTERUS WITH CERVIX; HYSTERECTOMY:  - CERVIX WITHOUT PATHOLOGIC CHANGES.  - INACTIVE ENDOMETRIUM WITH FOCALLY PROMINENT CILIATED CELLS.  - NO RESIDUAL ENDOMETRIAL ATYPICAL GLANDULAR PROLIFERATION (ENTIRE  ENDOMETRIUM SUBMITTED FOR HISTOLOGY).  - ADENOMYOSIS.   BILATERAL FALLOPIAN TUBES AND OVARIES; SALPINGO-OOPHORECTOMY:  - HEMORRHAGIC CORPUS ALBICANS, LEFT OVARY.  - BILATERAL OVARIAN CORTICAL INCLUSION CYSTS.  - RIGHT PARATUBAL CYSTS.   B. SENTINEL LYMPH NODE, LEFT ILIAC; EXCISION:  - NEGATIVE FORMALIGNANCY, ONE LYMPH NODE.   C. SENTINEL LYMPH NODE, RIGHT EXTERNAL ILIAC; EXCISION:  - NEGATIVE FOR MALIGNANCY, ONE LYMPH  NODE.  DIAGNOSIS:  A. PELVIC WASHINGS:  - NEGATIVE FOR MALIGNANCY.  - MACROPHAGES, MESOTHELIAL CELLS, AND LYMPHOCYTES.    Gyn-Onc History [name] is a pleasant female who is seen in consultation from Dr. Amalia Hailey for endometrial adenocarcinoma.   09/08/17 US Pelvic/transvaginal ultrasound showed Endometrium measuring 8.2 mm with fluid noted in endometrial canal. Left ovarian cyst measuring 3.5 x 2.8 x 2.4 cm. Right ovary was not visualized due to overlying bowel gas. Uterus measured 9.9 x 7.4 x 5.1 cm without evidence of focal masses. Echo texture was homogenous without evidence of focal masses.   09/09/17 Diagnosis:  ENDOMETRIUM, POLYP:  FRAGMENTS OF BENIGN ENDOMETRIAL POLYP, INFARCTED, AND WEAKLY  PROLIFERATIVE ENDOMETRIUM, MIXED WITH MINUTE FRAGMENTS OF BENIGN  ENDOCERVICAL GLANDS, SQUAMOUS EPITHELIUM, BLOOD, AND MUCUS. NO  HYPERPLASIA OR CARCINOMA.   10/11/17 Hysteroscopy/D&C w/ Dr. Amalia Hailey DIAGNOSIS:  A. ENDOCERVIX; CURETTAGE:  - MICROGLANDULAR HYPERPLASIA.  - ACUTE INFLAMMATION.  - NEGATIVE FOR ATYPIA AND MALIGNANCY  B. ENDOMETRIUM; CURETTAGE:  - ATYPICAL GLANDULAR PROLIFERATION, HIGHLY SUSPICIOUS FOR ENDOMETRIOID  ADENOCARCINOMA WITH PARTIAL PROGESTIN TREATMENT EFFECT, SEE COMMENT.   She opted for surgical management.   12/15/2017 TLH, BSO, with sentinel lymph node mapping and biopsies. Pathology negative for malignancy.  Problem List: Patient Active Problem List   Diagnosis Date Noted  . 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 Hypotension 09/12/2016  . Right facial numbness 09/11/2016  . Stroke-like symptom 09/11/2016  . Seizure ([place]) 09/11/2016  . Hypothyroidism 09/11/2016  . Stroke-like symptoms 09/11/2016  . Fall   . Syncope 11/20/2015  . Scalp laceration 11/20/2015  . Major depressive disorder, recurrent severe without psychotic features ([place]) 03/23/2015  . DDD (  degenerative disc disease)   . ACUTE BRONCHITIS 10/22/2009  . GERD 08/02/2009   . DEGENERATIVE DISC DISEASE 08/02/2009  . COUGH 08/02/2009  . Bipolar disorder ([place]) 08/01/2009  . Alcoholism ([place]) 08/01/2009  . BULIMIA 08/01/2009  . HEARING LOSS, BILATERAL 08/01/2009  . OSTEOPENIA 08/01/2009  . SEIZURE DISORDER 08/01/2009    Past Medical History: Past Medical History:  Diagnosis Date  . Abnormal uterine bleeding   . ADHD (attention deficit hyperactivity disorder)   . Allergy   . Anxiety   . Bipolar 1 disorder ([place])   . Bipolar disorder ([place])   . DDD (degenerative disc disease)   . Depression   . HSV infection   . Hx: UTI (urinary tract infection)   . Pertussis   . Seizures ([place])    last seizure 12/2014   . Thyroid disease     Past Surgical History: Past Surgical History:  Procedure Laterality Date  . CERVICAL SPINE SURGERY     x 3 2001,2005,2007 c5-6  . CESAREAN SECTION  2003  . HYSTEROSCOPY N/A 10/11/2017   Procedure: HYSTEROSCOPY;  Surgeon: Harlin Heys, MD;  Location: [facility] ORS;  Service: Gynecology;  Laterality: N/A;  . HYSTEROSCOPY W/D&C N/A 10/11/2017   Procedure: DILATATION AND CURETTAGE /HYSTEROSCOPY;  Surgeon: Harlin Heys, MD;  Location: [facility] ORS;  Service: Gynecology;  Laterality: N/A;  . TONSILLECTOMY    . TUBAL LIGATION      Past Gynecologic History:  As per HPI  OB History:  OB History  Gravida Para Term Preterm AB Living  5 4 3 1 1 4   SAB TAB Ectopic Multiple Live Births          4    # Outcome Date GA Lbr Len/2nd Weight Sex Delivery Anes PTL Lv  5 Preterm 2003    M CS-Unspec   LIV  4 Term 1998    F Vag-Spont  N LIV  3 Term 1990    M Vag-Spont  N LIV  2 Term 1988    F Vag-Spont  N LIV  1 AB 1986              Family History: Family History  Problem Relation Age of Onset  . Cervical cancer Mother   . Cervical cancer Maternal Grandmother   . Polycythemia Maternal Grandmother   . Stomach cancer Father   . Colon cancer Paternal Grandmother   . Cervical cancer Cousin   . Cervical cancer Cousin   .  Diabetes Mellitus II Neg Hx   . CAD Neg Hx   . Colon polyps Neg Hx   . Heart disease Neg Hx   . Rectal cancer Neg Hx     Social History: Social History   Socioeconomic History  . Marital status: Legally Separated    Spouse name: Not on file  . Number of children: 4  . Years of education: Not on file  . Highest education level: Not on file  Social Needs  . Financial resource strain: Not on file  . Food insecurity - worry: Not on file  . Food insecurity - inability: Not on file  . Transportation needs - medical: Not on file  . Transportation needs - non-medical: Not on file  Occupational History  . Not on file  Tobacco Use  . Smoking status: Never Smoker  . Smokeless tobacco: Never Used  Substance and Sexual Activity  . Alcohol use: Yes    Alcohol/week: 1.8 oz    Types: 3 Cans of beer  per week    Comment: 1-3 drinks weeky  . Drug use: No    Comment: last used in 1990's  . Sexual activity: Yes    Birth control/protection: Surgical  Other Topics Concern  . Not on file  Social History Narrative  . Not on file    Allergies: Allergies  Allergen Reactions  . Naproxen Anaphylaxis    Current Medications: Current Outpatient Medications  Medication Sig Dispense Refill  . 5-Hydroxytryptophan (5-HTP) 100 MG CAPS Take 1 capsule by mouth daily.    Marland Kitchen acetaminophen (TYLENOL) 500 MG tablet Take 500-1,000 mg by mouth every 6 (six) hours as needed for mild pain.     Marland Kitchen acyclovir (ZOVIRAX) 400 MG tablet TK 1 T PO BID  9  . amphetamine-dextroamphetamine (ADDERALL XR) 20 MG 24 hr capsule TK 1 C PO QAM  0  . Ascorbic Acid (VITAMIN C) 1000 MG tablet Take 1,000 mg by mouth daily.    Marland Kitchen aspirin EC 81 MG EC tablet Take 1 tablet (81 mg total) by mouth daily. 30 tablet 0  . Aspirin-Salicylamide-Caffeine (BC HEADACHE POWDER PO) Take 2 Doses by mouth daily.    . cholecalciferol (VITAMIN D) 1000 units tablet Take 1,000 Units by mouth daily.    . clonazePAM (KLONOPIN) 1 MG tablet TK 1 T PO QD  PRA  2  . Cranberry 1000 MG CAPS Take 1 capsule by mouth daily.    . cyclobenzaprine (FLEXERIL) 5 MG tablet Take 5 mg by mouth 3 (three) times daily.  2  . EPINEPHrine (EPIPEN 2-PAK) 0.3 mg/0.3 mL IJ SOAJ injection Inject 0.3 mg into the muscle once as needed (severe allergic reaction).    Marland Kitchen lamoTRIgine (LAMICTAL) 100 MG tablet Take 100 mg by mouth daily.     . Magnesium 200 MG TABS Take 200 mg by mouth daily.    . milk thistle 175 MG tablet Take 350 mg by mouth daily.    . Oxycodone HCl 10 MG TABS Take 10 mg by mouth every 4 (four) hours as needed.     Marland Kitchen RELISTOR 150 MG TABS TK 3 TS PO QD IN THE MORNING  2  . traZODone (DESYREL) 100 MG tablet Take 1 tablet (100 mg total) by mouth at bedtime as needed for sleep. 30 tablet 0  . traMADol (ULTRAM) 50 MG tablet Take 1 tablet (50 mg total) by mouth every 6 (six) hours as needed. 20 tablet 0  . valACYclovir (VALTREX) 500 MG tablet Take 500 mg by mouth 2 (two) times daily.     No current facility-administered medications for this visit.     Review of Systems General: General: positive for fatigue  HEENT: positive for hearing loss  Lungs: no complaints  Cardiac: no complaints  GI: positive for nausea, constipation  GU: no bleeding or discharge; complaining of dysuria  Musculoskeletal: positive for joint and back pain  Extremities: pain in legs  Skin: bruising and swelling which has improved  Neuro: positive for headaches  Endocrine: negative  Psych: positive for anxiety, depression, grief, and bipolar         Objective:  Physical Examination:  Vitals:   12/29/17 1132  BP: 105/69  Pulse: 99  Resp: 18  Temp: (!) 97.1 F (36.2 C)   Body mass index is 21.62 kg/m.    ECOG Performance Status: 0 - Asymptomatic  General appearance: appears older than stated age, in no apparent distress HEENT: ATNC Cardiovascular: RRR Respiratory: B CTA. Abdomen: SND but tender in the BLQ.  Incision all well healed. Diffuse echymosis R>L flank  which has improved significantly compared to last week. Extremities: No edema Skin exam: Well healed C-section incision and as above Neuro: alert, oriented, normal speech, no focal findings or movement disorder noted Pelvic: deferred  Labs Lab Results  Component Value Date   WBC 13.6 (H) 12/16/2017   HGB 9.4 (L) 12/16/2017   HCT 27.4 (L) 12/16/2017   MCV 92.4 12/16/2017   PLT 234 12/16/2017       Assessment:  OHANA BIRDWELL is a 52 y.o. female diagnosed with pathology suspicious for endometrial adenocarcinoma, s/p TLH BSO and sentinel nodes with negative pathology for malignancy. Adenomyosis. Right epigastric artery bleed s/p embolization, continues to improve.   Significant postoperative pain, challenging to control given narcotic history. Seen by [facility] ED for pain. Pain medication refilled. She still has not been in contact with her usual Pain team.  Medical co-morbidities complicating care: prior surgery; seizure disorder, hypothyroidism, major depressive disorder, recurrent severe without psychotic features.   Plan:   Problem List Items Addressed This Visit      Genitourinary   Glandular endometrial hyperplasia - Primary      We again discussed pathology findings which did not show evidence of malignancy. Provided with copy of results.   She has not filled prescription for Lidoderm patch due to cost or followed up with Pain Management for management of chronic pain. Discussed this again today as we can not continue refilling narcotics and strongly recommended that she reach out to her Pain Management team. [place] reviewed and she should have medication on hand. She can continue analgesics and use Lidocaine patches. Encouraged follow up with Mental Health for biopolar and grief counseling.   CBC pending at this time.   RTC in 3 weeks for post-op follow up and pelvic with Dr. Fransisca Connors.    I personally reviewed the patient's history, completed key elements of her exam,  and was involved in decision making in conjunction with Mrs. Allen.   Gillis Ends, MD   CC:  Harlin Heys,  MD [address] [place], [place] [address] [phone]

## 2017-12-29 NOTE — Progress Notes (Signed)
Still having pain in lower abd. Took oxycodone around 5:30 this am. She has nausea and vomiting and last time vomiting was last night and it was yellowish green. She does not have a appetite. Wt 4 lbs less. She also has constipation. She used enema last night and has some stool but feels rightness and pressure at her anal area.  She also urinates with burning on urination and spasm like pain when urinating.

## 2017-12-30 ENCOUNTER — Encounter: Payer: Self-pay | Admitting: Emergency Medicine

## 2017-12-30 ENCOUNTER — Telehealth: Payer: Self-pay | Admitting: *Deleted

## 2017-12-30 ENCOUNTER — Other Ambulatory Visit: Payer: Self-pay | Admitting: Obstetrics and Gynecology

## 2017-12-30 ENCOUNTER — Emergency Department: Payer: Medicare HMO

## 2017-12-30 ENCOUNTER — Other Ambulatory Visit: Payer: Self-pay

## 2017-12-30 ENCOUNTER — Inpatient Hospital Stay
Admission: EM | Admit: 2017-12-30 | Discharge: 2018-01-05 | DRG: 862 | Disposition: A | Discharge status: Home / Self Care | Payer: Medicare HMO | Attending: Obstetrics & Gynecology | Admitting: Obstetrics & Gynecology

## 2017-12-30 DIAGNOSIS — N739 Female pelvic inflammatory disease, unspecified: Secondary | ICD-10-CM | POA: Diagnosis present

## 2017-12-30 DIAGNOSIS — G8918 Other acute postprocedural pain: Secondary | ICD-10-CM

## 2017-12-30 DIAGNOSIS — T8140XA Infection following a procedure, unspecified, initial encounter: Principal | ICD-10-CM | POA: Diagnosis present

## 2017-12-30 DIAGNOSIS — Z7982 Long term (current) use of aspirin: Secondary | ICD-10-CM

## 2017-12-30 DIAGNOSIS — R7881 Bacteremia: Secondary | ICD-10-CM | POA: Diagnosis present

## 2017-12-30 DIAGNOSIS — Z23 Encounter for immunization: Secondary | ICD-10-CM

## 2017-12-30 DIAGNOSIS — F909 Attention-deficit hyperactivity disorder, unspecified type: Secondary | ICD-10-CM | POA: Diagnosis present

## 2017-12-30 DIAGNOSIS — T8143XA Infection following a procedure, organ and space surgical site, initial encounter: Secondary | ICD-10-CM | POA: Diagnosis not present

## 2017-12-30 DIAGNOSIS — Z79899 Other long term (current) drug therapy: Secondary | ICD-10-CM

## 2017-12-30 DIAGNOSIS — N9489 Other specified conditions associated with female genital organs and menstrual cycle: Secondary | ICD-10-CM

## 2017-12-30 DIAGNOSIS — R188 Other ascites: Secondary | ICD-10-CM

## 2017-12-30 DIAGNOSIS — F1120 Opioid dependence, uncomplicated: Secondary | ICD-10-CM | POA: Diagnosis present

## 2017-12-30 DIAGNOSIS — G8929 Other chronic pain: Secondary | ICD-10-CM | POA: Diagnosis present

## 2017-12-30 DIAGNOSIS — B9562 Methicillin resistant Staphylococcus aureus infection as the cause of diseases classified elsewhere: Secondary | ICD-10-CM | POA: Diagnosis not present

## 2017-12-30 DIAGNOSIS — Z6821 Body mass index (BMI) 21.0-21.9, adult: Secondary | ICD-10-CM

## 2017-12-30 DIAGNOSIS — Z886 Allergy status to analgesic agent status: Secondary | ICD-10-CM

## 2017-12-30 DIAGNOSIS — R1084 Generalized abdominal pain: Secondary | ICD-10-CM | POA: Diagnosis not present

## 2017-12-30 DIAGNOSIS — K5900 Constipation, unspecified: Secondary | ICD-10-CM | POA: Diagnosis not present

## 2017-12-30 DIAGNOSIS — E43 Unspecified severe protein-calorie malnutrition: Secondary | ICD-10-CM | POA: Diagnosis present

## 2017-12-30 DIAGNOSIS — R569 Unspecified convulsions: Secondary | ICD-10-CM | POA: Diagnosis present

## 2017-12-30 DIAGNOSIS — Z90710 Acquired absence of both cervix and uterus: Secondary | ICD-10-CM | POA: Diagnosis not present

## 2017-12-30 DIAGNOSIS — R109 Unspecified abdominal pain: Secondary | ICD-10-CM | POA: Diagnosis present

## 2017-12-30 DIAGNOSIS — C541 Malignant neoplasm of endometrium: Secondary | ICD-10-CM | POA: Diagnosis not present

## 2017-12-30 DIAGNOSIS — K651 Peritoneal abscess: Secondary | ICD-10-CM | POA: Diagnosis not present

## 2017-12-30 DIAGNOSIS — K6811 Postprocedural retroperitoneal abscess: Secondary | ICD-10-CM | POA: Diagnosis not present

## 2017-12-30 LAB — URINALYSIS, COMPLETE (UACMP) WITH MICROSCOPIC
Bilirubin Urine: NEGATIVE
Glucose, UA: NEGATIVE mg/dL
Ketones, ur: NEGATIVE mg/dL
Nitrite: NEGATIVE
Protein, ur: NEGATIVE mg/dL
Specific Gravity, Urine: 1.033 — ABNORMAL HIGH (ref 1.005–1.030)
pH: 8 (ref 5.0–8.0)

## 2017-12-30 LAB — URINE CULTURE: Culture: NO GROWTH

## 2017-12-30 LAB — CBC WITH DIFFERENTIAL/PLATELET
Basophils Absolute: 0.1 K/uL (ref 0–0.1)
Basophils Relative: 1 %
Eosinophils Absolute: 0.1 K/uL (ref 0–0.7)
Eosinophils Relative: 0 %
HCT: 33.3 % — ABNORMAL LOW (ref 35.0–47.0)
Hemoglobin: 11 g/dL — ABNORMAL LOW (ref 12.0–16.0)
Lymphocytes Relative: 12 %
Lymphs Abs: 1.8 K/uL (ref 1.0–3.6)
MCH: 30.4 pg (ref 26.0–34.0)
MCHC: 33.2 g/dL (ref 32.0–36.0)
MCV: 91.6 fL (ref 80.0–100.0)
Monocytes Absolute: 1 K/uL — ABNORMAL HIGH (ref 0.2–0.9)
Monocytes Relative: 7 %
Neutro Abs: 12 K/uL — ABNORMAL HIGH (ref 1.4–6.5)
Neutrophils Relative %: 80 %
Platelets: 829 K/uL — ABNORMAL HIGH (ref 150–440)
RBC: 3.63 MIL/uL — ABNORMAL LOW (ref 3.80–5.20)
RDW: 15.1 % — ABNORMAL HIGH (ref 11.5–14.5)
WBC: 15 K/uL — ABNORMAL HIGH (ref 3.6–11.0)

## 2017-12-30 LAB — COMPREHENSIVE METABOLIC PANEL
ALBUMIN: 3.7 g/dL (ref 3.5–5.0)
ALT: 13 U/L — ABNORMAL LOW (ref 14–54)
AST: 17 U/L (ref 15–41)
Alkaline Phosphatase: 100 U/L (ref 38–126)
Anion gap: 13 (ref 5–15)
BUN: 12 mg/dL (ref 6–20)
CHLORIDE: 97 mmol/L — AB (ref 101–111)
CO2: 28 mmol/L (ref 22–32)
Calcium: 9.5 mg/dL (ref 8.9–10.3)
Creatinine, Ser: 0.8 mg/dL (ref 0.44–1.00)
GFR calc Af Amer: >60 mL/min (ref >60)
GFR calc non Af Amer: >60 mL/min (ref >60)
GLUCOSE: 124 mg/dL — AB (ref 65–99)
POTASSIUM: 3.7 mmol/L (ref 3.5–5.1)
Sodium: 138 mmol/L (ref 135–145)
Total Bilirubin: 0.7 mg/dL (ref 0.3–1.2)
Total Protein: 7.9 g/dL (ref 6.5–8.1)

## 2017-12-30 LAB — LACTIC ACID, PLASMA: Lactic Acid, Venous: 1.3 mmol/L (ref 0.5–1.9)

## 2017-12-30 LAB — LIPASE, BLOOD: Lipase: 24 U/L (ref 11–51)

## 2017-12-30 MED ORDER — DEXTROSE-NACL 5-0.9 % IV SOLN
INTRAVENOUS | Status: DC
  Administered 2017-12-30 – 2018-01-04 (×9): via INTRAVENOUS

## 2017-12-30 MED ORDER — INFLUENZA VAC SPLIT QUAD 0.5 ML IM SUSY
0.5000 mL | PREFILLED_SYRINGE | INTRAMUSCULAR | Status: AC
  Administered 2018-01-03: 0.5 mL via INTRAMUSCULAR
  Filled 2017-12-30: qty 0.5

## 2017-12-30 MED ORDER — IOPAMIDOL (ISOVUE-300) INJECTION 61%
100.0000 mL | Freq: Once | INTRAVENOUS | Status: AC | PRN
  Administered 2017-12-30: 100 mL via INTRAVENOUS

## 2017-12-30 MED ORDER — METHYLNALTREXONE BROMIDE 12 MG/0.6ML ~~LOC~~ SOLN
12.0000 mg | Freq: Once | SUBCUTANEOUS | Status: AC
  Administered 2017-12-30: 12 mg via SUBCUTANEOUS
  Filled 2017-12-30: qty 0.6

## 2017-12-30 MED ORDER — SODIUM CHLORIDE 0.9 % IV BOLUS (SEPSIS)
1000.0000 mL | Freq: Once | INTRAVENOUS | Status: AC
  Administered 2017-12-30: 1000 mL via INTRAVENOUS

## 2017-12-30 MED ORDER — ACETAMINOPHEN 650 MG RE SUPP: 650.0000 mg | Freq: Four times a day (QID) | RECTAL | Status: DC | PRN

## 2017-12-30 MED ORDER — ONDANSETRON HCL 4 MG/2ML IJ SOLN
4.0000 mg | Freq: Four times a day (QID) | INTRAMUSCULAR | Status: DC | PRN
  Administered 2017-12-31 (×2): 4 mg via INTRAVENOUS
  Filled 2017-12-30 (×2): qty 2

## 2017-12-30 MED ORDER — ONDANSETRON HCL 4 MG/2ML IJ SOLN: 4.0000 mg | Freq: Four times a day (QID) | INTRAMUSCULAR | Status: DC | PRN

## 2017-12-30 MED ORDER — HALOPERIDOL LACTATE 5 MG/ML IJ SOLN
5.0000 mg | Freq: Once | INTRAMUSCULAR | Status: AC
  Administered 2017-12-30: 5 mg via INTRAVENOUS
  Filled 2017-12-30 (×2): qty 1

## 2017-12-30 MED ORDER — NALOXONE HCL 0.4 MG/ML IJ SOLN: 0.4000 mg | INTRAMUSCULAR | Status: DC | PRN

## 2017-12-30 MED ORDER — DIPHENHYDRAMINE HCL 12.5 MG/5ML PO ELIX: 12.5000 mg | ORAL_SOLUTION | Freq: Four times a day (QID) | ORAL | Status: DC | PRN

## 2017-12-30 MED ORDER — ACETAMINOPHEN 325 MG PO TABS: 650.0000 mg | ORAL_TABLET | Freq: Four times a day (QID) | ORAL | Status: DC | PRN

## 2017-12-30 MED ORDER — DIPHENHYDRAMINE HCL 50 MG/ML IJ SOLN: 12.5000 mg | Freq: Four times a day (QID) | INTRAMUSCULAR | Status: DC | PRN

## 2017-12-30 MED ORDER — HYDROMORPHONE 1 MG/ML IV SOLN
INTRAVENOUS | Status: DC
Start: 2017-12-31 — End: 2018-01-04
  Administered 2017-12-31: 25 mg via INTRAVENOUS
  Administered 2017-12-31: 1.4 mg via INTRAVENOUS
  Administered 2017-12-31: 2.4 mg via INTRAVENOUS
  Administered 2017-12-31: 1.5 mg via INTRAVENOUS
  Administered 2017-12-31: 25 mg via INTRAVENOUS
  Administered 2018-01-01: 1.8 mg via INTRAVENOUS
  Administered 2018-01-01: 3 mg via INTRAVENOUS
  Administered 2018-01-01: 2.4 mg via INTRAVENOUS
  Administered 2018-01-01: 1.8 mg via INTRAVENOUS
  Administered 2018-01-02 (×2): 2.4 mL via INTRAVENOUS
  Administered 2018-01-02: 25 mg via INTRAVENOUS
  Administered 2018-01-02: 1.5 mg via INTRAVENOUS
  Administered 2018-01-03: 3 mg via INTRAVENOUS
  Administered 2018-01-03: 25 mg via INTRAVENOUS
  Administered 2018-01-03: 4.5 mg via INTRAVENOUS
  Administered 2018-01-03: 1.2 mg via INTRAVENOUS
  Administered 2018-01-04: 1.8 mg via INTRAVENOUS
  Filled 2017-12-30 (×4): qty 25

## 2017-12-30 MED ORDER — PIPERACILLIN-TAZOBACTAM 3.375 G IVPB
3.3750 g | Freq: Three times a day (TID) | INTRAVENOUS | Status: DC
  Administered 2017-12-31 – 2018-01-04 (×13): 3.375 g via INTRAVENOUS
  Filled 2017-12-30 (×13): qty 50

## 2017-12-30 MED ORDER — SODIUM CHLORIDE 0.9% FLUSH
9.0000 mL | INTRAVENOUS | Status: DC | PRN
  Administered 2018-01-01: 9 mL via INTRAVENOUS
  Filled 2017-12-30: qty 9

## 2017-12-30 MED ORDER — VANCOMYCIN HCL IN DEXTROSE 1-5 GM/200ML-% IV SOLN
1000.0000 mg | Freq: Once | INTRAVENOUS | Status: AC
  Administered 2017-12-30: 1000 mg via INTRAVENOUS
  Filled 2017-12-30: qty 200

## 2017-12-30 MED ORDER — PIPERACILLIN-TAZOBACTAM 3.375 G IVPB 30 MIN
3.3750 g | Freq: Once | INTRAVENOUS | Status: AC
  Administered 2017-12-30: 3.375 g via INTRAVENOUS
  Filled 2017-12-30: qty 50

## 2017-12-30 MED ORDER — VANCOMYCIN HCL IN DEXTROSE 1-5 GM/200ML-% IV SOLN
1000.0000 mg | INTRAVENOUS | Status: DC
  Administered 2017-12-31 – 2018-01-04 (×6): 1000 mg via INTRAVENOUS
  Filled 2017-12-30 (×6): qty 200

## 2017-12-30 NOTE — ED Triage Notes (Signed)
Pt from home via EMS with complaints of abd pain and constipation. Pt reports that she has not been able to have a BM and has had an opioid constipation before and this does not feel the same. Pt is tearful during triage.

## 2017-12-30 NOTE — Progress Notes (Addendum)
ANTIBIOTIC CONSULT NOTE - INITIAL  Pharmacy Consult for Zosyn and vancomycin Indication: intra - abdominal   Allergies  Allergen Reactions  . Naproxen Anaphylaxis    Patient Measurements: Height: 5\' 1"  (154.9 cm) Weight: 114 lb (51.7 kg) IBW/kg (Calculated) : 47.8 Adjusted Body Weight:   Vital Signs: Temp: 98.7 F (37.1 C) (03/07 1747) Temp Source: Oral (03/07 1747) BP: 149/80 (03/07 1940) Pulse Rate: 72 (03/07 1940) Intake/Output from previous day: No intake/output data recorded. Intake/Output from this shift: No intake/output data recorded.  Labs: Recent Labs    12/29/17 1224 12/30/17 1751  WBC 15.1* 15.0*  HGB 10.8* 11.0*  PLT 701* 829*  CREATININE  --  0.80   Estimated Creatinine Clearance: 62.1 mL/min (by C-G formula based on SCr of 0.8 mg/dL). No results for input(s): VANCOTROUGH, VANCOPEAK, VANCORANDOM, GENTTROUGH, GENTPEAK, GENTRANDOM, TOBRATROUGH, TOBRAPEAK, TOBRARND, AMIKACINPEAK, AMIKACINTROU, AMIKACIN in the last 72 hours.   Microbiology: Recent Results (from the past 720 hour(s))  Urine culture     Status: None   Collection Time: 12/29/17 11:28 AM  Result Value Ref Range Status   Specimen Description   Final    URINE, CLEAN CATCH Performed at [facility], [name], [place], [place] [address]    Special Requests   Final    NONE Performed at [facility], [address], [place], [place] [address]    Culture   Final    NO GROWTH Performed at [facility], [place] [address] [place], [place] [address]    Report Status 12/30/2017 FINAL  Final    Medical History: Past Medical History:  Diagnosis Date  . Abnormal uterine bleeding   . ADHD (attention deficit hyperactivity disorder)   . Allergy   . Anxiety   . Bipolar 1 disorder ([place])   . Bipolar disorder ([place])   . DDD (degenerative disc disease)   . Depression   . HSV infection   . Hx: UTI (urinary tract infection)   . Pertussis   . Seizures ([place])    last seizure  12/2014   . Thyroid disease     Medications:   (Not in a hospital admission) Assessment: CrCl = 62.1 ml/min   Goal of Therapy:  resolution of infection  Plan:  Expected duration 7 days with resolution of temperature and/or normalization of WBC   Zosyn 3.375 gm IV Q8H EI ordered to start on 3/8 @ 0200.   Robbins,Jason D 12/30/2017,8:15 PM    DW 52kg  Vd 36L kei 0.056 hr-1  T1/2 12 hours Vancomycin 1 gram q 18 hours ordered with stacked dosing. Level before 5th dose. Goal trough 15-20.  Sim Boast, PharmD, BCPS  12/30/17 10:16 PM

## 2017-12-30 NOTE — ED Provider Notes (Signed)
[facility] Emergency Department Provider Note  ____________________________________________   First MD Initiated Contact with Patient 12/30/17 1735     (approximate)  I have reviewed the triage vital signs and the nursing notes.   HISTORY  Chief Complaint Constipation and Abdominal Pain   HPI [name] is a 52 y.o. female who comes to the emergency department with severe diffuse abdominal pain and constipation.  On February 20 of this year roughly 2-1/2 weeks ago the patient had a laparoscopic hysterectomy and she says she has not had a solid bowel movement ever since.  She has had several liquid stools a day.  She has severe throbbing constant lower abdominal pain nonradiating.  She was previously taking 10 mg oxycodone every 4 hours until her GYN oncologist stopped the medication yesterday.  She was seen and evaluated at [facility] 1 week ago and she had a CT scan showing postoperative changes and was discharged home.  She feels like her symptoms are slowly progressive.  They are worse with movement and when she attempts to eat.  She has had some nausea but no vomiting.  No fevers or chills.  Past Medical History:  Diagnosis Date  . Abnormal uterine bleeding   . ADHD (attention deficit hyperactivity disorder)   . Allergy   . Anxiety   . Bipolar 1 disorder ([place])   . Bipolar disorder ([place])   . DDD (degenerative disc disease)   . Depression   . HSV infection   . Hx: UTI (urinary tract infection)   . Pertussis   . Seizures ([place])    last seizure 12/2014   . Thyroid disease     Patient Active Problem List   Diagnosis Date Noted  . Abdominal pain 12/30/2017  . Acute blood loss anemia 12/17/2017  . S/P laparoscopic hysterectomy 12/15/2017  . 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 Hypotension 09/12/2016  . Right facial numbness 09/11/2016  . Stroke-like symptom 09/11/2016  . Seizure ([place]) 09/11/2016  . Hypothyroidism 09/11/2016  .  Stroke-like symptoms 09/11/2016  . Fall   . Syncope 11/20/2015  . Scalp laceration 11/20/2015  . Major depressive disorder, recurrent severe without psychotic features ([place]) 03/23/2015  . DDD (degenerative disc disease)   . ACUTE BRONCHITIS 10/22/2009  . GERD 08/02/2009  . DEGENERATIVE DISC DISEASE 08/02/2009  . COUGH 08/02/2009  . Bipolar disorder ([place]) 08/01/2009  . Alcoholism ([employer]) 08/01/2009  . BULIMIA 08/01/2009  . HEARING LOSS, BILATERAL 08/01/2009  . OSTEOPENIA 08/01/2009  . SEIZURE DISORDER 08/01/2009    Past Surgical History:  Procedure Laterality Date  . CERVICAL SPINE SURGERY     x 3 2001,2005,2007 c5-6  . CESAREAN SECTION  2003  . EMBOLIZATION N/A 12/16/2017   Procedure: EMBOLIZATION;  Surgeon: Algernon Huxley, MD;  Location: [facility];  Service: Cardiovascular;  Laterality: N/A;  . HYSTEROSCOPY N/A 10/11/2017   Procedure: HYSTEROSCOPY;  Surgeon: Harlin Heys, MD;  Location: [facility];  Service: Gynecology;  Laterality: N/A;  . HYSTEROSCOPY W/D&C N/A 10/11/2017   Procedure: DILATATION AND CURETTAGE /HYSTEROSCOPY;  Surgeon: Harlin Heys, MD;  Location: [facility];  Service: Gynecology;  Laterality: N/A;  . LAPAROSCOPIC BILATERAL SALPINGO OOPHERECTOMY Bilateral 12/15/2017   Procedure: LAPAROSCOPIC BILATERAL SALPINGO OOPHORECTOMY;  Surgeon: Mellody Drown, MD;  Location: [facility];  Service: Gynecology;  Laterality: Bilateral;  . LAPAROSCOPIC HYSTERECTOMY Bilateral 12/15/2017   Procedure: HYSTERECTOMY TOTAL LAPAROSCOPIC;  Surgeon: Mellody Drown, MD;  Location: [facility];  Service: Gynecology;  Laterality: Bilateral;  .  SENTINEL NODE BIOPSY N/A 12/15/2017   Procedure: SENTINEL NODE CVELFY,BOFBPZW;  Surgeon: Mellody Drown, MD;  Location: [facility];  Service: Gynecology;  Laterality: N/A;  . TONSILLECTOMY    . TUBAL LIGATION      Prior to Admission medications   Medication Sig Start Date End Date Taking? Authorizing Provider  5-Hydroxytryptophan  (5-HTP) 100 MG CAPS Take 100 mg by mouth daily.    Yes [provider]  acetaminophen (TYLENOL) 500 MG tablet Take 500-1,000 mg by mouth every 6 (six) hours as needed for mild pain or headache.    Yes [provider]  acyclovir (ZOVIRAX) 400 MG tablet Take 400 mg by mouth twice daily 08/09/17  Yes [provider]  amphetamine-dextroamphetamine (ADDERALL XR) 20 MG 24 hr capsule Take 40 mg by mouth daily.   Yes [provider]  Artificial Tear Ointment (DRY EYES OP) Place 1 drop into both eyes daily as needed (for dry eyes).   Yes [provider]  Ascorbic Acid (VITAMIN C) 1000 MG tablet Take 1,000 mg by mouth daily.   Yes [provider]  aspirin EC 81 MG EC tablet Take 1 tablet (81 mg total) by mouth daily. 09/14/16  Yes Rosita Fire, MD  Aspirin-Salicylamide-Caffeine [facility] HEADACHE POWDER PO) Take 1 packet by mouth daily as needed (for pain or headache).    Yes [provider]  cholecalciferol (VITAMIN D) 1000 units tablet Take 1,000 Units by mouth daily.   Yes [provider]  Cranberry 1000 MG CAPS Take 1,000 mg by mouth daily.    Yes [provider]  cyclobenzaprine (FLEXERIL) 5 MG tablet Take 5 mg by mouth 3 (three) times daily as needed for muscle spasms.  09/09/16  Yes [provider]  DULoxetine (CYMBALTA) 20 MG capsule Take 40 mg by mouth daily.   Yes [provider]  EPINEPHrine (EPIPEN 2-PAK) 0.3 mg/0.3 mL IJ SOAJ injection Inject 0.3 mg into the muscle once as needed (severe allergic reaction).   Yes [provider]  gabapentin (NEURONTIN) 300 MG capsule Take 1 capsule (300 mg total) by mouth 3 (three) times daily. 300mg  morning, afternoon and 900mg  before bed 12/17/17  Yes Ward, [name], MD  lamoTRIgine (LAMICTAL) 100 MG tablet Take 100 mg by mouth every morning.  08/02/15  Yes [provider]  linaclotide Rolan Lipa) 145 MCG CAPS capsule Take 1 capsule (145 mcg total)  by mouth daily before breakfast. 12/20/17  Yes Verlon Au, NP  Magnesium 200 MG TABS Take 200 mg by mouth every morning.    Yes [provider]  milk thistle 175 MG tablet Take 175 mg by mouth 2 (two) times daily.    Yes [provider]  ondansetron (ZOFRAN) 4 MG tablet Take 1 tablet (4 mg total) by mouth every 8 (eight) hours as needed for nausea or vomiting. 12/24/17  Yes Verlon Au, NP  Oxycodone HCl 10 MG TABS Take 10 mg by mouth 4 (four) times daily as needed.   Yes [provider]  traZODone (DESYREL) 100 MG tablet Take 1 tablet (100 mg total) by mouth at bedtime as needed for sleep. 11/22/15  Yes Reyne Dumas, MD  lidocaine (LIDODERM) 5 % Place 1 patch onto the skin daily. Remove & Discard patch within 12 hours or as directed by MD Patient not taking: Reported on 12/29/2017 12/22/17   Verlon Au, NP  traMADol (ULTRAM) 50 MG tablet Take 1 tablet (50 mg total) by mouth every 6 (six)  hours as needed. Patient not taking: Reported on 11/19/2017 09/22/17   Harlin Heys, MD    Allergies Naproxen  Family History  Problem Relation Age of Onset  . Cervical cancer Mother   . Cervical cancer Maternal Grandmother   . Polycythemia Maternal Grandmother   . Stomach cancer Father   . Colon cancer Paternal Grandmother   . Cervical cancer Cousin   . Cervical cancer Cousin   . Diabetes Mellitus II Neg Hx   . CAD Neg Hx   . Colon polyps Neg Hx   . Heart disease Neg Hx   . Rectal cancer Neg Hx     Social History Social History   Tobacco Use  . Smoking status: Never Smoker  . Smokeless tobacco: Never Used  Substance Use Topics  . Alcohol use: Yes    Alcohol/week: 1.8 oz    Types: 3 Cans of beer per week    Comment: 1-3 drinks weeky,  since surgery 2/19 no drinking  . Drug use: No    Comment: last used in 1990's    Review of Systems Constitutional: No fever/chills Eyes: No visual changes. ENT: No sore throat. Cardiovascular: Denies chest  pain. Respiratory: Denies shortness of breath. Gastrointestinal: Positive for abdominal pain.  Positive for nausea, no vomiting.  Positive for diarrhea.  Positive for constipation. Genitourinary: Negative for dysuria. Musculoskeletal: Negative for back pain. Skin: Negative for rash. Neurological: Negative for headaches, focal weakness or numbness.   ____________________________________________   PHYSICAL EXAM:  VITAL SIGNS: ED Triage Vitals  Enc Vitals Group     BP      Pulse      Resp      Temp      Temp src      SpO2      Weight      Height      Head Circumference      Peak Flow      Pain Score      Pain Loc      Pain Edu?      Excl. in [place]?     Constitutional: Alert and oriented x4 crying in pain appears quite uncomfortable Eyes: PERRL EOMI. Head: Atraumatic. Nose: No congestion/rhinnorhea. Mouth/Throat: No trismus Neck: No stridor.   Cardiovascular: Normal rate, regular rhythm. Grossly normal heart sounds.  Good peripheral circulation. Respiratory: Normal respiratory effort.  No retractions. Lungs CTAB and moving good air Gastrointestinal: Abdomen is not distended but she has exquisite tenderness particularly in the right and left lower quadrants with rebound although no frank peritonitis Musculoskeletal: No lower extremity edema   Neurologic:  Normal speech and language. No gross focal neurologic deficits are appreciated. Skin:  Skin is warm, dry and intact. No rash noted. Psychiatric: Mood and affect are normal. Speech and behavior are normal.    ____________________________________________   DIFFERENTIAL includes but not limited to  Drug-induced constipation, small bowel obstruction, large bowel obstruction, postoperative infection, sepsis ____________________________________________   LABS (all labs ordered are listed, but only abnormal results are displayed)  Labs Reviewed  COMPREHENSIVE METABOLIC PANEL - Abnormal; Notable for the following  components:      Result Value   Chloride 97 (*)    Glucose, Bld 124 (*)    ALT 13 (*)    All other components within normal limits  CBC WITH DIFFERENTIAL/PLATELET - Abnormal; Notable for the following components:   WBC 15.0 (*)    RBC 3.63 (*)    Hemoglobin 11.0 (*)  HCT 33.3 (*)    RDW 15.1 (*)    Platelets 829 (*)    Neutro Abs 12.0 (*)    Monocytes Absolute 1.0 (*)    All other components within normal limits  URINALYSIS, COMPLETE (UACMP) WITH MICROSCOPIC - Abnormal; Notable for the following components:   Color, Urine YELLOW (*)    APPearance CLOUDY (*)    Specific Gravity, Urine 1.033 (*)    Hgb urine dipstick SMALL (*)    Leukocytes, UA SMALL (*)    Bacteria, UA RARE (*)    Squamous Epithelial / LPF 0-5 (*)    All other components within normal limits  CULTURE, BLOOD (ROUTINE X 2)  CULTURE, BLOOD (ROUTINE X 2)  LIPASE, BLOOD  LACTIC ACID, PLASMA  CBC WITH DIFFERENTIAL/PLATELET  HIV ANTIBODY (ROUTINE TESTING)    Lab work reviewed by me shows significant dehydration along with an elevated white count which is concerning for active infection __________________________________________  EKG   ____________________________________________  RADIOLOGY  CT abdomen pelvis reviewed by me concerning for postoperative abscess ____________________________________________   PROCEDURES  Procedure(s) performed: no  .Critical Care Performed by: Darel Hong, MD Authorized by: Darel Hong, MD   Critical care provider statement:    Critical care time (minutes):  30   Critical care time was exclusive of:  Separately billable procedures and treating other patients   Critical care was necessary to treat or prevent imminent or life-threatening deterioration of the following conditions:  Sepsis   Critical care was time spent personally by me on the following activities:  Development of treatment plan with patient or surrogate, discussions with consultants, evaluation  of patient's response to treatment, examination of patient, obtaining history from patient or surrogate, ordering and performing treatments and interventions, ordering and review of laboratory studies, ordering and review of radiographic studies, pulse oximetry, re-evaluation of patient's condition and review of old charts    Critical Care performed: no  Observation: no ____________________________________________   INITIAL IMPRESSION / ASSESSMENT AND PLAN / ED COURSE  Pertinent labs & imaging results that were available during my care of the patient were reviewed by me and considered in my medical decision making (see chart for details).  The patient arrives uncomfortable appearing with a tender abdomen in the immediate postoperative period raising concern for bowel obstruction versus infection etc. Labs and a CT scan with IV contrast are pending.  IV Haldol for pain and nausea.    ----------------------------------------- 7:22 PM on 12/30/2017 -----------------------------------------  I discussed the case with Dr. Leafy Ro on-call for Dr. Leonides Schanz who recommends vancomycin and Zosyn and inpatient admission for interventional radiology guided drainage of the patient's abscess.  ____________________________________________  ----------------------------------------- 8:59 PM on 12/30/2017 -----------------------------------------  Dr. Leafy Ro called back and apparently the patient's primary OB gynecologist is actually at [facility] Dr. Fransisca Connors and the patient does not belong to Dr. Leonides Schanz.  We also do not have interventional radiology available right now and Dr. Leafy Ro is recommending transfer for higher level of care and to continue continuity of care.   ----------------------------------------- 9:33 PM on 12/30/2017 -----------------------------------------  I spoke with Dr. Sharman Crate Onc Fellow on-call for Dr. Fransisca Connors who would accept the patient, however [facility] is on diversion and does  not have capacity.  I then spoke with Dr. Leafy Ro who will kindly admit the patient to her service for further abx and drainage.   FINAL CLINICAL IMPRESSION(S) / ED DIAGNOSES  Final diagnoses:  Post-operative infection      NEW MEDICATIONS STARTED DURING THIS  VISIT:  New Prescriptions   No medications on file     Note:  This document was prepared using Dragon voice recognition software and may include unintentional dictation errors.     Darel Hong, MD 12/30/17 2136

## 2017-12-30 NOTE — H&P (Addendum)
Consult History and Physical   SERVICE: Gynecology:[facility] (previously Dr Amalia Hailey and Dr Leonides Schanz). Dr Leafy Ro contact me to evaluate the pt in ER for overnight admit. She communicated to me there were no beds at [facility].   Patient Name: [name] Patient MRN:   [id number]  CC: Severe abdominal pain  HPI: [name] is a 52 y.o. [id number] with severe diffuse abdominal pain and constipation after a recent Lap Hyst for atypical glandular cells with the pathology being benign for endometrial cancer. Describes the pain as "severe throbbing constantly in the lower abd area (nonradiating).    Review of Systems: positives in bold GEN: No fevers, chills, weight changes, appetite changes, fatigue, night sweats HEENT: No  HA, vision changes, hearing loss, congestion, rhinorrhea, sinus pressure, dysphagia CV:  No  CP, palpitations PULM:No   SOB, cough GI:  ABD pain., POS nausea, no Vomiting, Pos diarrhea, POs constipation.  GU:  Neg dysuria, urgency, frequency MSK:  arthralgias, myalgias, back pain, swelling SKIN:  rashes, color changes, pallor NEURO:  numbness, weakness, tingling, seizures, dizziness, tremors PSYCH:  depression, anxiety, behavioral problems, confusion  HEME/LYMPH:  easy bruising or bleeding ENDO:  heat/cold intolerance  Past Obstetrical History: OB History    Gravida Para Term Preterm AB Living   5 4 3 1 1 4    SAB TAB Ectopic Multiple Live Births           4      Past Gynecologic History: Patient's last menstrual period was 09/22/2017 (exact date). Pt had a recent hysterectomy. Pt had glandular cells in the endometrium and was suspect for endometrial carcinoma but, was neg after Lap per Dr WArd/Dr Fransisca Connors.  Past Medical History: Past Medical History:  Diagnosis Date  . Abnormal uterine bleeding   . ADHD (attention deficit hyperactivity disorder)   . Allergy   . Anxiety   . Bipolar 1 disorder ([place])   . Bipolar disorder ([place])   . DDD  (degenerative disc disease)   . Depression   . HSV infection   . Hx: UTI (urinary tract infection)   . Pertussis   . Seizures ([place])    last seizure 12/2014   . Thyroid disease     Past Surgical History:   Past Surgical History:  Procedure Laterality Date  . CERVICAL SPINE SURGERY     x 3 2001,2005,2007 c5-6  . CESAREAN SECTION  2003  . EMBOLIZATION N/A 12/16/2017   Procedure: EMBOLIZATION;  Surgeon: Algernon Huxley, MD;  Location: [place] [facility];  Service: Cardiovascular;  Laterality: N/A;  . HYSTEROSCOPY N/A 10/11/2017   Procedure: HYSTEROSCOPY;  Surgeon: Harlin Heys, MD;  Location: [facility];  Service: Gynecology;  Laterality: N/A;  . HYSTEROSCOPY W/D&C N/A 10/11/2017   Procedure: DILATATION AND CURETTAGE /HYSTEROSCOPY;  Surgeon: Harlin Heys, MD;  Location: [facility];  Service: Gynecology;  Laterality: N/A;  . LAPAROSCOPIC BILATERAL SALPINGO OOPHERECTOMY Bilateral 12/15/2017   Procedure: LAPAROSCOPIC BILATERAL SALPINGO OOPHORECTOMY;  Surgeon: Mellody Drown, MD;  Location: [facility];  Service: Gynecology;  Laterality: Bilateral;  . LAPAROSCOPIC HYSTERECTOMY Bilateral 12/15/2017   Procedure: HYSTERECTOMY TOTAL LAPAROSCOPIC;  Surgeon: Mellody Drown, MD;  Location: [facility];  Service: Gynecology;  Laterality: Bilateral;  . SENTINEL NODE BIOPSY N/A 12/15/2017   Procedure: SENTINEL NODE IDPOEU,MPNTIRW;  Surgeon: Mellody Drown, MD;  Location: [facility];  Service: Gynecology;  Laterality: N/A;  . TONSILLECTOMY    . TUBAL LIGATION      Family History:  family history includes  Cervical cancer in her cousin, cousin, maternal grandmother, and mother; Colon cancer in her paternal grandmother; Polycythemia in her maternal grandmother; Stomach cancer in her father.  Social History:  Social History   Socioeconomic History  . Marital status: Legally Separated    Spouse name: Not on file  . Number of children: 4  . Years of education: Not on file  . Highest education  level: Not on file  Social Needs  . Financial resource strain: Not on file  . Food insecurity - worry: Not on file  . Food insecurity - inability: Not on file  . Transportation needs - medical: Not on file  . Transportation needs - non-medical: Not on file  Occupational History  . Not on file  Tobacco Use  . Smoking status: Never Smoker  . Smokeless tobacco: Never Used  Substance and Sexual Activity  . Alcohol use: Yes    Alcohol/week: 1.8 oz    Types: 3 Cans of beer per week    Comment: 1-3 drinks weeky,  since surgery 2/19 no drinking  . Drug use: No    Comment: last used in 1990's  . Sexual activity: Yes    Birth control/protection: Surgical  Other Topics Concern  . Not on file  Social History Narrative  . Not on file    Home Medications:  Medications reconciled in EPIC  No current facility-administered medications on file prior to encounter.    Current Outpatient Medications on File Prior to Encounter  Medication Sig Dispense Refill  . 5-Hydroxytryptophan (5-HTP) 100 MG CAPS Take 100 mg by mouth daily.     Marland Kitchen acetaminophen (TYLENOL) 500 MG tablet Take 500-1,000 mg by mouth every 6 (six) hours as needed for mild pain or headache.     Marland Kitchen acyclovir (ZOVIRAX) 400 MG tablet Take 400 mg by mouth twice daily  9  . amphetamine-dextroamphetamine (ADDERALL XR) 20 MG 24 hr capsule Take 40 mg by mouth daily.    . Artificial Tear Ointment (DRY EYES OP) Place 1 drop into both eyes daily as needed (for dry eyes).    . Ascorbic Acid (VITAMIN C) 1000 MG tablet Take 1,000 mg by mouth daily.    Marland Kitchen aspirin EC 81 MG EC tablet Take 1 tablet (81 mg total) by mouth daily. 30 tablet 0  . Aspirin-Salicylamide-Caffeine (BC HEADACHE POWDER PO) Take 1 packet by mouth daily as needed (for pain or headache).     . cholecalciferol (VITAMIN D) 1000 units tablet Take 1,000 Units by mouth daily.    . Cranberry 1000 MG CAPS Take 1,000 mg by mouth daily.     . cyclobenzaprine (FLEXERIL) 5 MG tablet Take 5 mg  by mouth 3 (three) times daily as needed for muscle spasms.   2  . DULoxetine (CYMBALTA) 20 MG capsule Take 40 mg by mouth daily.    Marland Kitchen EPINEPHrine (EPIPEN 2-PAK) 0.3 mg/0.3 mL IJ SOAJ injection Inject 0.3 mg into the muscle once as needed (severe allergic reaction).    . gabapentin (NEURONTIN) 300 MG capsule Take 1 capsule (300 mg total) by mouth 3 (three) times daily. 300mg  morning, afternoon and 900mg  before bed 150 capsule 3  . lamoTRIgine (LAMICTAL) 100 MG tablet Take 100 mg by mouth every morning.     . linaclotide (LINZESS) 145 MCG CAPS capsule Take 1 capsule (145 mcg total) by mouth daily before breakfast. 30 capsule 0  . Magnesium 200 MG TABS Take 200 mg by mouth every morning.     . milk  thistle 175 MG tablet Take 175 mg by mouth 2 (two) times daily.     . ondansetron (ZOFRAN) 4 MG tablet Take 1 tablet (4 mg total) by mouth every 8 (eight) hours as needed for nausea or vomiting. 10 tablet 0  . Oxycodone HCl 10 MG TABS Take 10 mg by mouth 4 (four) times daily as needed.    . traZODone (DESYREL) 100 MG tablet Take 1 tablet (100 mg total) by mouth at bedtime as needed for sleep. 30 tablet 0  . lidocaine (LIDODERM) 5 % Place 1 patch onto the skin daily. Remove & Discard patch within 12 hours or as directed by MD (Patient not taking: Reported on 12/29/2017) 30 patch 0  . traMADol (ULTRAM) 50 MG tablet Take 1 tablet (50 mg total) by mouth every 6 (six) hours as needed. (Patient not taking: Reported on 11/19/2017) 20 tablet 0    Allergies:  Allergies  Allergen Reactions  . Naproxen Anaphylaxis    Physical Exam:  Temp:  [98.7 F (37.1 C)-98.9 F (37.2 C)] 98.9 F (37.2 C) (03/07 2244) Pulse Rate:  [71-82] 71 (03/07 2244) Resp:  [17-20] 18 (03/07 2244) BP: (113-149)/(66-81) 114/66 (03/07 2244) SpO2:  [98 %-100 %] 100 % (03/07 2244) Weight:  [114 lb (51.7 kg)] 114 lb (51.7 kg) (03/07 1748)   General Appearance:  Well developed, well nourished, no acute distress, alert and oriented  x3 HEENT:  Normocephalic atraumatic, extraocular movements intact, moist mucous membranes.Pt has a hearing defecit.  Cardiovascular:  Normal S1/S2, regular rate and rhythm, no murmurs Pulmonary:  clear to auscultation, no wheezes, rales or rhonchi, symmetric air entry, good air exchange Abdomen: Ecchymosis over lower abd where surgery was done with surgical lap scars visible, soft, Tender to palpation in lower abd, +rebound via ER evalutino,  nondistended, no abnormal masses, no epigastric pain Extremities:  Full range of motion, no pedal edema, 2+ distal pulses, no tenderness Skin:  normal coloration and turgor, no rashes, no suspicious skin lesions noted  Neurologic:  Cranial nerves 2-12 grossly intact, normal muscle tone, strength 5/5 all four extremities Psychiatric:  Normal mood and affect, appropriate, sedated after pain meds in the ER  Labs/Studies:   CBC and Coags:  Lab Results  Component Value Date   WBC 15.0 (H) 12/30/2017   NEUTOPHILPCT 80 12/30/2017   EOSPCT 0 12/30/2017   BASOPCT 1 12/30/2017   LYMPHOPCT 12 12/30/2017   HGB 11.0 (L) 12/30/2017   HCT 33.3 (L) 12/30/2017   MCV 91.6 12/30/2017   PLT 829 (H) 12/30/2017   INR 1.00 12/16/2017   CMP:  Lab Results  Component Value Date   NA 138 12/30/2017   K 3.7 12/30/2017   CL 97 (L) 12/30/2017   CO2 28 12/30/2017   BUN 12 12/30/2017   CREATININE 0.80 12/30/2017   CREATININE 0.88 12/15/2017   CREATININE 0.76 02/13/2017   PROT 7.9 12/30/2017   BILITOT 0.7 12/30/2017   BILIDIR <0.1 (L) 11/19/2015   ALT 13 (L) 12/30/2017   AST 17 12/30/2017   ALKPHOS 100 12/30/2017   Other Labs: Urinalysis    Component Value Date/Time   COLORURINE YELLOW (A) 12/30/2017 1956   APPEARANCEUR CLOUDY (A) 12/30/2017 1956   LABSPEC 1.033 (H) 12/30/2017 1956   PHURINE 8.0 12/30/2017 1956   GLUCOSEU NEGATIVE 12/30/2017 1956   HGBUR SMALL (A) 12/30/2017 1956   BILIRUBINUR NEGATIVE 12/30/2017 1956   KETONESUR NEGATIVE 12/30/2017 1956    PROTEINUR NEGATIVE 12/30/2017 1956   UROBILINOGEN 0.2 05/26/2015 8119  NITRITE NEGATIVE 12/30/2017 1956   LEUKOCYTESUR SMALL (A) 12/30/2017 1956    Other Imaging: Ct Abdomen Pelvis W Contrast  Addendum Date: 12/30/2017   ADDENDUM REPORT: 12/30/2017 19:15 ADDENDUM: Moderate gas within the subcutaneous soft tissues of the anterior abdominal wall, possible slow resolution of postoperative gas related to prior hysterectomy. Electronically Signed   By: Donavan Foil M.D.   On: 12/30/2017 19:15   Result Date: 12/30/2017 CLINICAL DATA:  Abdominal pain with constipation EXAM: CT ABDOMEN AND PELVIS WITH CONTRAST TECHNIQUE: Multidetector CT imaging of the abdomen and pelvis was performed using the standard protocol following bolus administration of intravenous contrast. CONTRAST:  131mL ISOVUE-300 IOPAMIDOL (ISOVUE-300) INJECTION 61% COMPARISON:  CT 02/13/2017, 10/14/2015 FINDINGS: Lower chest: Lung bases demonstrate no acute consolidation or pleural effusion. Heart size within normal limits. Hepatobiliary: Subcentimeter hypodensity within the left hepatic lobe, too small to further characterize but not significantly changed and therefore felt benign. Contracted gallbladder without calcified stones. No biliary dilatation. Pancreas: Unremarkable. No pancreatic ductal dilatation or surrounding inflammatory changes. Spleen: Normal in size without focal abnormality. Adrenals/Urinary Tract: Adrenal glands are within normal limits. Kidneys show no hydronephrosis. The bladder is unremarkable. Stomach/Bowel: Stomach is nonenlarged. No dilated small bowel. No definite colon wall thickening. Vascular/Lymphatic: Nonaneurysmal aorta. Mild aortic atherosclerosis. Mild retroperitoneal lymph nodes measuring to 9 mm. Patient is status post endovascular coiling of right inferior epigastric artery. Reproductive: Status post hysterectomy.  No adnexal masses. Other: Negative for free air. Heterogenous soft tissue density posterior  to the urinary bladder within the pelvis. This contains multiple irregular fluid collections. The dominant collection within the posterior pelvis measures 7 x 4.6 cm and exerts mass effect on the rectosigmoid colon. Moderate gas within the subcutaneous soft tissues of the anterior abdominal wall. Musculoskeletal: No acute or suspicious bone lesion. IMPRESSION: 1. Patient is status post hysterectomy. Ill-defined soft tissue density within the pelvis, posterior to the urinary bladder which may reflect combination of hematoma and postsurgical changes. Multiple irregular rim enhancing fluid collections in the region with a dominant 7 cm fluid collection in the posterior pelvis that is exerting mass effect on the rectosigmoid colon. Findings could be secondary to organizing postoperative fluid collection or hematoma, with or without the presence of infection. 2. Negative for small bowel obstruction 3. Interval endovascular coiling of right inferior epigastric artery Electronically Signed: By: Donavan Foil M.D. On: 12/30/2017 18:55     Assessment / Plan:   KALLYN DEMARCUS is a 52 y.o. [id number] who presents with Abdominal pain post Lap Hyst and neg endometrial cancer, polyp only. Bowel obstruction ruled out  A. 1. Abdominal Pain RLQ and LLQ 2. 7 cm pelvic mass vs fluid in post pelvis & poss abcess 3. Hematoma post-surgery noted on previous CT 4. Chronic pain on meds 5. Narcotic Dependence with constipation 6. Recent Lap Hyst with normal endometrial cells (AGUS)  P: Admit to Dr Amalia Hailey (he declined as he had transferred to Dr Werner Lean for surgery), Dr Leonides Schanz declines to accept pt as she feels the pt needs to go to [facility] however, there are no beds tonight so pt will be admitted and then transferred when able to get a bed at [facility]). 2. Meds per orders. 3. Dr Shary Decamp advised to order Zosyn and Vancomycin, get CBC in am, Interventional Radiologist in am for procedure if abcess, PCA pump and IV hydration. Catheryn Bacon, RN, MSN, CNM, FNP   Thank you for the opportunity to be involved with this pt's care.

## 2017-12-30 NOTE — Telephone Encounter (Signed)
Patient called and left message that she is not eating and is nauseated and that she is not having a constitutional bowel movement She is taking the Linzess and passing liquid stool, but "that is not a constitutional bowel movement". Demanding that her call be returned today. Per Alease Medina, NP call patient and tell her to go to ER for evaluation if she is that bad and she will follow up with her tomorrow Call returned to patient and she states she will go to ER. She asked which one to go to and I told her it was her choice

## 2017-12-31 ENCOUNTER — Inpatient Hospital Stay: Payer: Medicare HMO

## 2017-12-31 DIAGNOSIS — E43 Unspecified severe protein-calorie malnutrition: Secondary | ICD-10-CM

## 2017-12-31 LAB — BLOOD CULTURE ID PANEL (REFLEXED)
ACINETOBACTER BAUMANNII: NOT DETECTED
Candida albicans: NOT DETECTED
Candida glabrata: NOT DETECTED
Candida krusei: NOT DETECTED
Candida parapsilosis: NOT DETECTED
Candida tropicalis: NOT DETECTED
Enterobacter cloacae complex: NOT DETECTED
Enterobacteriaceae species: NOT DETECTED
Enterococcus species: NOT DETECTED
Escherichia coli: NOT DETECTED
HAEMOPHILUS INFLUENZAE: NOT DETECTED
KLEBSIELLA OXYTOCA: NOT DETECTED
KLEBSIELLA PNEUMONIAE: NOT DETECTED
Listeria monocytogenes: NOT DETECTED
Methicillin resistance: DETECTED — AB
Neisseria meningitidis: NOT DETECTED
PSEUDOMONAS AERUGINOSA: NOT DETECTED
Proteus species: NOT DETECTED
STREPTOCOCCUS AGALACTIAE: NOT DETECTED
STREPTOCOCCUS PNEUMONIAE: NOT DETECTED
STREPTOCOCCUS PYOGENES: NOT DETECTED
STREPTOCOCCUS SPECIES: NOT DETECTED
Serratia marcescens: NOT DETECTED
Staphylococcus aureus (BCID): NOT DETECTED
Staphylococcus species: DETECTED — AB

## 2017-12-31 LAB — CBC WITH DIFFERENTIAL/PLATELET
BASOS PCT: 0 %
Basophils Absolute: 0 10*3/uL (ref 0–0.1)
EOS ABS: 0.1 10*3/uL (ref 0–0.7)
Eosinophils Relative: 1 %
HCT: 29.6 % — ABNORMAL LOW (ref 35.0–47.0)
Hemoglobin: 9.5 g/dL — ABNORMAL LOW (ref 12.0–16.0)
Lymphocytes Relative: 14 %
Lymphs Abs: 1.6 10*3/uL (ref 1.0–3.6)
MCH: 29.3 pg (ref 26.0–34.0)
MCHC: 32 g/dL (ref 32.0–36.0)
MCV: 91.4 fL (ref 80.0–100.0)
MONO ABS: 1 10*3/uL — AB (ref 0.2–0.9)
Monocytes Relative: 9 %
Neutro Abs: 9.1 10*3/uL — ABNORMAL HIGH (ref 1.4–6.5)
Neutrophils Relative %: 76 %
Platelets: 699 10*3/uL — ABNORMAL HIGH (ref 150–440)
RBC: 3.24 MIL/uL — ABNORMAL LOW (ref 3.80–5.20)
RDW: 14.9 % — AB (ref 11.5–14.5)
WBC: 11.9 10*3/uL — ABNORMAL HIGH (ref 3.6–11.0)

## 2017-12-31 LAB — URINE DRUG SCREEN, QUALITATIVE (ARMC ONLY)
AMPHETAMINES, UR SCREEN: NOT DETECTED
BARBITURATES, UR SCREEN: NOT DETECTED
Benzodiazepine, Ur Scrn: NOT DETECTED
COCAINE METABOLITE, UR ~~LOC~~: NOT DETECTED
Cannabinoid 50 Ng, Ur ~~LOC~~: NOT DETECTED
MDMA (Ecstasy)Ur Screen: NOT DETECTED
METHADONE SCREEN, URINE: NOT DETECTED
OPIATE, UR SCREEN: POSITIVE — AB
PHENCYCLIDINE (PCP) UR S: NOT DETECTED
Tricyclic, Ur Screen: NOT DETECTED

## 2017-12-31 MED ORDER — ONDANSETRON HCL 4 MG PO TABS: 4.0000 mg | ORAL_TABLET | Freq: Three times a day (TID) | ORAL | Status: DC | PRN

## 2017-12-31 MED ORDER — 5-HTP 100 MG PO CAPS: 100.0000 mg | ORAL_CAPSULE | Freq: Every day | ORAL | Status: DC

## 2017-12-31 MED ORDER — GABAPENTIN 300 MG PO CAPS
300.0000 mg | ORAL_CAPSULE | Freq: Two times a day (BID) | ORAL | Status: DC
  Administered 2018-01-01 – 2018-01-05 (×10): 300 mg via ORAL
  Filled 2017-12-31 (×10): qty 1

## 2017-12-31 MED ORDER — ACETAMINOPHEN 500 MG PO TABS
1000.0000 mg | ORAL_TABLET | Freq: Four times a day (QID) | ORAL | Status: DC
  Administered 2017-12-31 – 2018-01-02 (×5): 1000 mg via ORAL
  Administered 2018-01-02: 500 mg via ORAL
  Administered 2018-01-02 – 2018-01-05 (×11): 1000 mg via ORAL
  Filled 2017-12-31 (×19): qty 2

## 2017-12-31 MED ORDER — SODIUM CHLORIDE 0.9% FLUSH
5.0000 mL | Freq: Three times a day (TID) | INTRAVENOUS | Status: DC
  Administered 2017-12-31 – 2018-01-04 (×11): 5 mL

## 2017-12-31 MED ORDER — FENTANYL CITRATE (PF) 100 MCG/2ML IJ SOLN
INTRAMUSCULAR | Status: AC
  Filled 2017-12-31: qty 4

## 2017-12-31 MED ORDER — DULOXETINE HCL 20 MG PO CPEP
40.0000 mg | ORAL_CAPSULE | Freq: Every day | ORAL | Status: DC
  Administered 2017-12-31 – 2018-01-05 (×6): 40 mg via ORAL
  Filled 2017-12-31 (×6): qty 2

## 2017-12-31 MED ORDER — MIDAZOLAM HCL 5 MG/5ML IJ SOLN
INTRAMUSCULAR | Status: AC
  Filled 2017-12-31: qty 5

## 2017-12-31 MED ORDER — MIDAZOLAM HCL 2 MG/2ML IJ SOLN
INTRAMUSCULAR | Status: AC | PRN
  Administered 2017-12-31 (×2): 1 mg via INTRAVENOUS

## 2017-12-31 MED ORDER — GABAPENTIN 300 MG PO CAPS
900.0000 mg | ORAL_CAPSULE | Freq: Every day | ORAL | Status: DC
  Administered 2017-12-31 – 2018-01-04 (×5): 900 mg via ORAL
  Filled 2017-12-31 (×5): qty 3

## 2017-12-31 MED ORDER — LAMOTRIGINE 100 MG PO TABS
100.0000 mg | ORAL_TABLET | ORAL | Status: DC
  Administered 2018-01-01 – 2018-01-05 (×5): 100 mg via ORAL
  Filled 2017-12-31 (×5): qty 1

## 2017-12-31 MED ORDER — FENTANYL CITRATE (PF) 100 MCG/2ML IJ SOLN
INTRAMUSCULAR | Status: AC | PRN
  Administered 2017-12-31 (×2): 50 ug via INTRAVENOUS

## 2017-12-31 MED ORDER — LINACLOTIDE 145 MCG PO CAPS
145.0000 ug | ORAL_CAPSULE | Freq: Every day | ORAL | Status: DC
  Administered 2018-01-01 – 2018-01-05 (×5): 145 ug via ORAL
  Filled 2017-12-31 (×5): qty 1

## 2017-12-31 MED ORDER — LIDOCAINE HCL 1 % IJ SOLN
INTRAMUSCULAR | Status: AC | PRN
  Administered 2017-12-31: 10 mL via INTRADERMAL

## 2017-12-31 NOTE — Procedures (Signed)
Interventional Radiology Procedure Note  Procedure: CT guided drainage of pelvic abscess after surgery.  68F drain placed. .  Complications: None Recommendations:  - Follow up culture - Do not submerge   - Routine drain care   Signed,  Dulcy Fanny. Earleen Newport, DO

## 2017-12-31 NOTE — Significant Event (Signed)
Rapid Response Event Note  Overview: Time Called: 1300 Arrival Time: 1302 Event Type: Hypotension  Initial Focused Assessment: When this RN arrived to room patient alert, oriented and communicating. Bedside RN stated blood pressure had dropped to 67'T systolic. When this RN arrived and BP had recycled SBP in high 90's, Bedside RN was having trouble having a doctor accept this patient. Rosana Hoes took over in finding MD for this patient.  Interventions: Recycled BP.  Plan of Care (if not transferred): Rosana Hoes to find MD coverage for this patient,  Event Summary:   at      at    Outcome: Stayed in room and stabalized  Event End Time: [place]

## 2017-12-31 NOTE — Progress Notes (Signed)
Initial Nutrition Assessment  DOCUMENTATION CODES:   Severe malnutrition in context of acute illness/injury  INTERVENTION:   - Once diet advanced, RD to order Ensure Enlive po BID, each supplement provides 350 kcal and 20 grams of protein - Encourage PO intake once diet advanced  NUTRITION DIAGNOSIS:   Severe Malnutrition related to acute illness(infection, abscess s/p laparoscopic hysterectomy) as evidenced by energy intake < or equal to 50% for > or equal to 5 days, moderate muscle depletion, mild fat depletion.  GOAL:   Patient will meet greater than or equal to 90% of their needs  MONITOR:   PO intake, Diet advancement, Supplement acceptance, Weight trends  REASON FOR ASSESSMENT:   Malnutrition Screening Tool   ASSESSMENT:   52 year old female who presented to ED with severe diffuse abdominal pain and constipation after recent laparoscopic hysterectomy. Pt with PMH significant for anxiety, bipolar disorder, depression, seizures, and thyroid disease.  Spoke with pt at bedside who reports having a poor appetite for 2 weeks PTA. Pt states that before this time, she had a "normal" appetite. Pt endorses recent nausea and vomiting. Pt states she is very constipated. Pending IR guided drainage.  Over the past 2 weeks, pt has only been able to tolerate chicken and 1-2 Ensure oral nutrition supplements daily. Over the past week, pt states she barely ate at all. Pt agreeable to receiving Ensure Enlive oral nutrition supplement once diet advanced.  Pt endorses recent weight loss. Pt states her UBW is 124 lbs and that she last weighed this 4 weeks ago. Per weight history in chart, pt has experienced a 7.3% weight loss in 4 months which is not significant for timeframe.  I/O's: +1.5 L  Medications reviewed and include: D5 @ 125 mL/hr (provides 510 kcal/day), IV Zosyn and Vancomycin  Labs reviewed: hemoglobin 9.5 (L), chloride 97 (L), ALT 13 (L)  NUTRITION - FOCUSED PHYSICAL  EXAM:    Most Recent Value  Orbital Region  Mild depletion  Upper Arm Region  Mild depletion  Thoracic and Lumbar Region  Moderate depletion  Buccal Region  Mild depletion  Temple Region  Mild depletion  Clavicle Bone Region  Moderate depletion  Clavicle and Acromion Bone Region  Moderate depletion  Scapular Bone Region  Moderate depletion  Dorsal Hand  No depletion  Patellar Region  Mild depletion  Anterior Thigh Region  Moderate depletion  Posterior Calf Region  Mild depletion  Edema (RD Assessment)  None  Hair  Reviewed  Eyes  Reviewed  Mouth  Reviewed  Skin  Reviewed  Nails  Reviewed       Diet Order:  Diet NPO time specified  EDUCATION NEEDS:   No education needs have been identified at this time  Skin:  Skin Assessment: Skin Integrity Issues: Skin Integrity Issues:: Incisions, Other (Comment) Incisions: abdomen Other: ecchymosis to abdomen and arm  Last BM:  12/29/17 per pt, small BM after pt gave self enema  Height:   Ht Readings from Last 1 Encounters:  12/30/17 5\' 1"  (1.549 m)    Weight:   Wt Readings from Last 1 Encounters:  12/30/17 114 lb (51.7 kg)    Ideal Body Weight:  47.7 kg  BMI:  Body mass index is 21.54 kg/m.  Estimated Nutritional Needs:   Kcal:  1500-1700 kcal/day  Protein:  60-75 grams/day  Fluid:  1.5-1.7 L/day    Gaynell Face, MS, RD, LDN Pager: [phone] Weekend/After Hours: [phone]

## 2017-12-31 NOTE — Progress Notes (Signed)
PHARMACIST - PHYSICIAN ORDER COMMUNICATION  CONCERNING: P&T Medication Policy on Herbal Medications  DESCRIPTION:  This patient's order for:  5-HTP  has been noted.  This product(s) is classified as an "herbal" or natural product. Due to a lack of definitive safety studies or FDA approval, nonstandard manufacturing practices, plus the potential risk of unknown drug-drug interactions while on inpatient medications, the Pharmacy and Therapeutics Committee does not permit the use of "herbal" or natural products of this type within [facility].   ACTION TAKEN: The pharmacy department is unable to verify this order at this time and your patient has been informed of this safety policy. Please reevaluate patient's clinical condition at discharge and address if the herbal or natural product(s) should be resumed at that time.

## 2017-12-31 NOTE — Sedation Documentation (Signed)
Report called to [facility].Silva Bandy

## 2017-12-31 NOTE — Progress Notes (Signed)
Chaplain responded to a code blue. When chaplain arrived team said they were ok   12/31/17 1300  Clinical Encounter Type  Visited With Patient  Visit Type Spiritual support  Referral From Nurse  Spiritual Encounters  Spiritual Needs Emotional

## 2017-12-31 NOTE — Progress Notes (Signed)
PHARMACY - PHYSICIAN COMMUNICATION CRITICAL VALUE ALERT - BLOOD CULTURE IDENTIFICATION (BCID)  Results for orders placed or performed during the hospital encounter of 12/30/17  Blood Culture ID Panel (Reflexed) (Collected: 12/30/2017  7:52 PM)  Result Value Ref Range   Enterococcus species NOT DETECTED NOT DETECTED   Listeria monocytogenes NOT DETECTED NOT DETECTED   Staphylococcus species DETECTED (A) NOT DETECTED   Staphylococcus aureus NOT DETECTED NOT DETECTED   Methicillin resistance DETECTED (A) NOT DETECTED   Streptococcus species NOT DETECTED NOT DETECTED   Streptococcus agalactiae NOT DETECTED NOT DETECTED   Streptococcus pneumoniae NOT DETECTED NOT DETECTED   Streptococcus pyogenes NOT DETECTED NOT DETECTED   Acinetobacter baumannii NOT DETECTED NOT DETECTED   Enterobacteriaceae species NOT DETECTED NOT DETECTED   Enterobacter cloacae complex NOT DETECTED NOT DETECTED   Escherichia coli NOT DETECTED NOT DETECTED   Klebsiella oxytoca NOT DETECTED NOT DETECTED   Klebsiella pneumoniae NOT DETECTED NOT DETECTED   Proteus species NOT DETECTED NOT DETECTED   Serratia marcescens NOT DETECTED NOT DETECTED   Haemophilus influenzae NOT DETECTED NOT DETECTED   Neisseria meningitidis NOT DETECTED NOT DETECTED   Pseudomonas aeruginosa NOT DETECTED NOT DETECTED   Candida albicans NOT DETECTED NOT DETECTED   Candida glabrata NOT DETECTED NOT DETECTED   Candida krusei NOT DETECTED NOT DETECTED   Candida parapsilosis NOT DETECTED NOT DETECTED   Candida tropicalis NOT DETECTED NOT DETECTED    Name of physician (or Provider) Contacted: Chelsea Ward   Changes to prescribed antibiotics required: No, will continue pt on Vancomycin , currently dosed by pharmacy.   Jamerica Snavely D 12/31/2017  7:36 PM

## 2017-12-31 NOTE — Progress Notes (Addendum)
Obstetric and Gynecology  HD 2  Subjective   Admitted overnight for pain and findings concerning for intraabdominal abscess/fluid collection s/p laparoscopic hysterectomy + BSO and pelvic sentinel node biopsies on 12/15/17 for atypical glandular cells.  Pathology was negative for malignancy.  Surgery complicated by interruption of right inferior epigastric artery upon closing of incision; she was kept inpatient and IR embolization of two branches or right inferior epigastric, and was discharged 12/17/17.  She is a chronic pain patient, opioid dependent, and had 1 week of her narcotics prescribed at discharge, with agreement to see Dr. Theora Gianotti for re-evaluation and further Rx of pain management in 1 week.  She states she called their clinic describing pain; she was dismissed as having constipation.  Yesterday she presented to the ED with pain, and found to have a fluid collection on CT, concerning for hematoma vs abscess.  She was admitted with PCA, and kept NPO for IR evaluation/treatment today.  Today, she had an IR drainage of abscess and has a drain with JP bulb, with purulent drainage noted.    Patient doing well, complains of abdominal pain some nausea, but better since admission, tolerating PO intake, tolerating pain with PO meds, ambulating without difficulty, voiding spontaneously.     Denies CP, SOB, F/C, V/D, or leg pain.   Objective  Objective:   Vitals:   12/31/17 1636 12/31/17 1708 12/31/17 1740 12/31/17 1831  BP: 99/65 (!) 88/57 (!) 86/59 97/62  Pulse: 88 88 91 91  Resp: 13 15 16 17   Temp: 98.5 F (36.9 C) 97.9 F (36.6 C) 97.8 F (36.6 C) 98.1 F (36.7 C)  TempSrc: Oral Oral Oral Oral  SpO2: 94% 96% 96% 98%  Weight:      Height:        I/O last 3 completed shifts: In: 2279.6 [I.V.:989.6; IV Piggyback:1290] Out: 500 [Urine:500] Total I/O In: -  Out: 800 [Urine:700; Drains:100]  Intake/Output Summary (Last 24 hours) at 12/31/2017 1847 Last data filed at 12/31/2017  1807 Gross per 24 hour  Intake 2279.58 ml  Output 1300 ml  Net 979.58 ml   General: NAD Cardiovascular: RRR, no murmurs Pulmonary: CTAB, normal respiratory effort Abdomen: Non-tender, +BS, voluntary guarding.  Incisions c/d/i, with ecchymosis on right, wrapping to back and down right thigh. JP drain from left glute with bloody purulent drainage. Extremities: No erythema or cords, no calf tenderness, with normal peripheral pulses.  Labs: Results for orders placed or performed during the hospital encounter of 12/30/17 (from the past 24 hour(s))  Lactic acid, plasma     Status: None   Collection Time: 12/30/17  7:36 PM  Result Value Ref Range   Lactic Acid, Venous 1.3 0.5 - 1.9 mmol/L  Blood Culture (routine x 2)     Status: None (Preliminary result)   Collection Time: 12/30/17  7:36 PM  Result Value Ref Range   Specimen Description BLOOD LAC    Special Requests      BOTTLES DRAWN AEROBIC AND ANAEROBIC Blood Culture adequate volume   Culture      NO GROWTH < 12 HOURS Performed at [facility], [address] [place], [place] [address]    Report Status PENDING   Blood Culture (routine x 2)     Status: None (Preliminary result)   Collection Time: 12/30/17  7:52 PM  Result Value Ref Range   Specimen Description BLOOD RIGHT HAND    Special Requests      BOTTLES DRAWN AEROBIC AND ANAEROBIC Blood Culture  adequate volume   Culture  Setup Time      Organism ID to follow ANAEROBIC BOTTLE ONLY GRAM POSITIVE COCCI CRITICAL RESULT CALLED TO, READ BACK BY AND VERIFIED WITH: Performed at [facility], [address] [place], [place] [address]    Culture GRAM POSITIVE COCCI    Report Status PENDING   Urinalysis, Complete w Microscopic     Status: Abnormal   Collection Time: 12/30/17  7:56 PM  Result Value Ref Range   Color, Urine YELLOW (A) YELLOW   APPearance CLOUDY (A) CLEAR   Specific Gravity, Urine 1.033 (H) 1.005 - 1.030   pH 8.0 5.0 - 8.0   Glucose, UA  NEGATIVE NEGATIVE mg/dL   Hgb urine dipstick SMALL (A) NEGATIVE   Bilirubin Urine NEGATIVE NEGATIVE   Ketones, ur NEGATIVE NEGATIVE mg/dL   Protein, ur NEGATIVE NEGATIVE mg/dL   Nitrite NEGATIVE NEGATIVE   Leukocytes, UA SMALL (A) NEGATIVE   RBC / HPF 6-30 0 - 5 RBC/hpf   WBC, UA 6-30 0 - 5 WBC/hpf   Bacteria, UA RARE (A) NONE SEEN   Squamous Epithelial / LPF 0-5 (A) NONE SEEN   Budding Yeast PRESENT    Amorphous Crystal PRESENT   CBC with Differential/Platelet     Status: Abnormal   Collection Time: 12/31/17  4:32 AM  Result Value Ref Range   WBC 11.9 (H) 3.6 - 11.0 K/uL   RBC 3.24 (L) 3.80 - 5.20 MIL/uL   Hemoglobin 9.5 (L) 12.0 - 16.0 g/dL   HCT 29.6 (L) 35.0 - 47.0 %   MCV 91.4 80.0 - 100.0 fL   MCH 29.3 26.0 - 34.0 pg   MCHC 32.0 32.0 - 36.0 g/dL   RDW 14.9 (H) 11.5 - 14.5 %   Platelets 699 (H) 150 - 440 K/uL   Neutrophils Relative % 76 %   Neutro Abs 9.1 (H) 1.4 - 6.5 K/uL   Lymphocytes Relative 14 %   Lymphs Abs 1.6 1.0 - 3.6 K/uL   Monocytes Relative 9 %   Monocytes Absolute 1.0 (H) 0.2 - 0.9 K/uL   Eosinophils Relative 1 %   Eosinophils Absolute 0.1 0 - 0.7 K/uL   Basophils Relative 0 %   Basophils Absolute 0.0 0 - 0.1 K/uL  Urine Drug Screen, Qualitative ([facility] only)     Status: Abnormal   Collection Time: 12/31/17  2:44 PM  Result Value Ref Range   Tricyclic, Ur Screen NONE DETECTED NONE DETECTED   Amphetamines, Ur Screen NONE DETECTED NONE DETECTED   MDMA (Ecstasy)Ur Screen NONE DETECTED NONE DETECTED   Cocaine Metabolite,Ur [place] NONE DETECTED NONE DETECTED   Opiate, Ur Screen POSITIVE (A) NONE DETECTED   Phencyclidine (PCP) Ur S NONE DETECTED NONE DETECTED   Cannabinoid 50 Ng, Ur [place] NONE DETECTED NONE DETECTED   Barbiturates, Ur Screen NONE DETECTED NONE DETECTED   Benzodiazepine, Ur Scrn NONE DETECTED NONE DETECTED   Methadone Scn, Ur NONE DETECTED NONE DETECTED    Cultures: Results for orders placed or performed during the hospital encounter of 12/30/17   Blood Culture (routine x 2)     Status: None (Preliminary result)   Collection Time: 12/30/17  7:36 PM  Result Value Ref Range Status   Specimen Description BLOOD LAC  Final   Special Requests   Final    BOTTLES DRAWN AEROBIC AND ANAEROBIC Blood Culture adequate volume   Culture   Final    NO GROWTH < 12 HOURS Performed at [facility], [address] [place], [place] [address]    Report Status PENDING  Incomplete  Blood Culture (routine x 2)     Status: None (Preliminary result)   Collection Time: 12/30/17  7:52 PM  Result Value Ref Range Status   Specimen Description BLOOD RIGHT HAND  Final   Special Requests   Final    BOTTLES DRAWN AEROBIC AND ANAEROBIC Blood Culture adequate volume   Culture  Setup Time   Final    Organism ID to follow ANAEROBIC BOTTLE ONLY GRAM POSITIVE COCCI CRITICAL RESULT CALLED TO, READ BACK BY AND VERIFIED WITH: Performed at [facility], [address], [place], [place] [address]    Culture GRAM POSITIVE COCCI  Final   Report Status PENDING  Incomplete    Imaging: Ct Abdomen Pelvis W Contrast  Addendum Date: 12/30/2017   ADDENDUM REPORT: 12/30/2017 19:15 ADDENDUM: Moderate gas within the subcutaneous soft tissues of the anterior abdominal wall, possible slow resolution of postoperative gas related to prior hysterectomy. Electronically Signed   By: Donavan Foil M.D.   On: 12/30/2017 19:15   Result Date: 12/30/2017 CLINICAL DATA:  Abdominal pain with constipation EXAM: CT ABDOMEN AND PELVIS WITH CONTRAST TECHNIQUE: Multidetector CT imaging of the abdomen and pelvis was performed using the standard protocol following bolus administration of intravenous contrast. CONTRAST:  142mL ISOVUE-300 IOPAMIDOL (ISOVUE-300) INJECTION 61% COMPARISON:  CT 02/13/2017, 10/14/2015 FINDINGS: Lower chest: Lung bases demonstrate no acute consolidation or pleural effusion. Heart size within normal limits. Hepatobiliary: Subcentimeter hypodensity within the  left hepatic lobe, too small to further characterize but not significantly changed and therefore felt benign. Contracted gallbladder without calcified stones. No biliary dilatation. Pancreas: Unremarkable. No pancreatic ductal dilatation or surrounding inflammatory changes. Spleen: Normal in size without focal abnormality. Adrenals/Urinary Tract: Adrenal glands are within normal limits. Kidneys show no hydronephrosis. The bladder is unremarkable. Stomach/Bowel: Stomach is nonenlarged. No dilated small bowel. No definite colon wall thickening. Vascular/Lymphatic: Nonaneurysmal aorta. Mild aortic atherosclerosis. Mild retroperitoneal lymph nodes measuring to 9 mm. Patient is status post endovascular coiling of right inferior epigastric artery. Reproductive: Status post hysterectomy.  No adnexal masses. Other: Negative for free air. Heterogenous soft tissue density posterior to the urinary bladder within the pelvis. This contains multiple irregular fluid collections. The dominant collection within the posterior pelvis measures 7 x 4.6 cm and exerts mass effect on the rectosigmoid colon. Moderate gas within the subcutaneous soft tissues of the anterior abdominal wall. Musculoskeletal: No acute or suspicious bone lesion. IMPRESSION: 1. Patient is status post hysterectomy. Ill-defined soft tissue density within the pelvis, posterior to the urinary bladder which may reflect combination of hematoma and postsurgical changes. Multiple irregular rim enhancing fluid collections in the region with a dominant 7 cm fluid collection in the posterior pelvis that is exerting mass effect on the rectosigmoid colon. Findings could be secondary to organizing postoperative fluid collection or hematoma, with or without the presence of infection. 2. Negative for small bowel obstruction 3. Interval endovascular coiling of right inferior epigastric artery Electronically Signed: By: Donavan Foil M.D. On: 12/30/2017 18:55     Assessment    52 y.o. [place] Hospital Day: 2   Plan   1. Bacteremia - gram + cocci in aerobic identified as a methicillin resistant organism.  Already on IV vancomycin dosed by pharmacy, and IV zosyn should another organism be identified.  Continue this. 2. Abscess:  Drain in place by IR, out left glute.  Continue flushing and maintain per IR instructions.  Gram +  cocci returned  3. Epigastric injury:  Ecchymosis in various stages of healing, no expansion beyond prior discharge.   4. Pain control:  Chronic narcotic dependence for various reasons, namely back surgery.  Acutely, she is postop with complications.  She takes 10mg  of oxycodone q4h routinely.  Will keep on PCA for now and calculate whether the usage increases from her normal expected dependence.  She will also receive tylenol and gabapentin PO per her home meds. 5. Reg diet 6. Ambulate as tolerated.  7. Continue inpatient admission.  ----- Larey Days, MD Attending Obstetrician and Gynecologist [facility], Department of OB/GYN [facility]    45 mins spent with patient, reviewing labs, consulting providers, and writing this note. >50% was face-to-face with patient.

## 2018-01-01 LAB — CBC
HEMATOCRIT: 28 % — AB (ref 35.0–47.0)
Hemoglobin: 9.3 g/dL — ABNORMAL LOW (ref 12.0–16.0)
MCH: 30.6 pg (ref 26.0–34.0)
MCHC: 33.3 g/dL (ref 32.0–36.0)
MCV: 91.9 fL (ref 80.0–100.0)
Platelets: 719 10*3/uL — ABNORMAL HIGH (ref 150–440)
RBC: 3.05 MIL/uL — AB (ref 3.80–5.20)
RDW: 14.6 % — AB (ref 11.5–14.5)
WBC: 14.8 10*3/uL — AB (ref 3.6–11.0)

## 2018-01-01 LAB — HIV ANTIBODY (ROUTINE TESTING W REFLEX): HIV Screen 4th Generation wRfx: NONREACTIVE

## 2018-01-01 LAB — CREATININE, SERUM
Creatinine, Ser: 0.86 mg/dL (ref 0.44–1.00)
GFR calc Af Amer: >60 mL/min (ref >60)
GFR calc non Af Amer: >60 mL/min (ref >60)

## 2018-01-01 NOTE — Progress Notes (Addendum)
Obstetric and Gynecology  Subjective  [name] is a 52 y.o. female [id number] who presented on 12/30/2017 for abcess vs hematoma (CT showed fluid collection) admitted on 12/30/17 for antibiotics, pain meds and consult to Interventional Radiology. Pt formerly had a Lap HYST on 12/15/17 and pelvic sentinel node biopsies for atypical glandular cells. Pathy was negative for malignancy. Complications of the surgery included: Rt inf epigastric artery interruption which required IR to embolize two brances of Rt inferior epigastric. Pt ahs chronic pain from her 3 prior neck sugeries and taking Oxycodone 10 mg po q 4 hours prescribed by her pain clinic. Pt has had constipation and did take an enema prior to coming to the hospital with results. Pt is awake and talking and informed me she is on Disability.  Objective   Vitals:   01/01/18 0839 01/01/18 0844  BP:  (!) 94/49  Pulse:  88  Resp: 13 15  Temp:  97.8 F (36.6 C)  SpO2: 95% 98%    Intake/Output Summary (Last 24 hours) at 01/01/2018 1227 Last data filed at 01/01/2018 0936 Gross per 24 hour  Intake 240 ml  Output 1230 ml  Net -990 ml    General: NAD, A,A& O x 3 Cardiovascular: RRR, no murmurs, S1S2,  Pulmonary: CTAB, no W/R/R Abdomen: Benign. Non-tender, +BS, no guarding. Incisions:Lap Hyst incisions are healing and intact, Ecchymosis on Rt radiating to he back and down the Rt thigh Drain: JP drain intact to Lt buttock with bloody clear dc.  Extremities: No erythema or cords, no calf tenderness, +warmth with normal peripheral pulses.  Labs: Results for orders placed or performed during the hospital encounter of 12/30/17 (from the past 24 hour(s))  Urine Drug Screen, Qualitative ([place] only)     Status: Abnormal   Collection Time: 12/31/17  2:44 PM  Result Value Ref Range   Tricyclic, Ur Screen NONE DETECTED NONE DETECTED   Amphetamines, Ur Screen NONE DETECTED NONE DETECTED   MDMA (Ecstasy)Ur Screen NONE DETECTED NONE DETECTED   Cocaine Metabolite,Ur [place] NONE DETECTED NONE DETECTED   Opiate, Ur Screen POSITIVE (A) NONE DETECTED   Phencyclidine (PCP) Ur S NONE DETECTED NONE DETECTED   Cannabinoid 50 Ng, Ur [place] NONE DETECTED NONE DETECTED   Barbiturates, Ur Screen NONE DETECTED NONE DETECTED   Benzodiazepine, Ur Scrn NONE DETECTED NONE DETECTED   Methadone Scn, Ur NONE DETECTED NONE DETECTED  Body fluid culture     Status: None (Preliminary result)   Collection Time: 12/31/17  4:10 PM  Result Value Ref Range   Specimen Description FLUID PELVIC FLUID    Special Requests      NONE Performed at [facility], [address] [place], [place] [address]    Gram Stain      MODERATE WBC PRESENT, PREDOMINANTLY MONONUCLEAR NO ORGANISMS SEEN    Culture PENDING    Report Status PENDING   Creatinine, serum     Status: None   Collection Time: 01/01/18  4:47 AM  Result Value Ref Range   Creatinine, Ser 0.86 0.44 - 1.00 mg/dL   GFR calc non Af Amer >60 >60 mL/min   GFR calc Af Amer >60 >60 mL/min  CBC     Status: Abnormal   Collection Time: 01/01/18  4:47 AM  Result Value Ref Range   WBC 14.8 (H) 3.6 - 11.0 K/uL   RBC 3.05 (L) 3.80 - 5.20 MIL/uL   Hemoglobin 9.3 (L) 12.0 - 16.0 g/dL   HCT 28.0 (L) 35.0 -  47.0 %   MCV 91.9 80.0 - 100.0 fL   MCH 30.6 26.0 - 34.0 pg   MCHC 33.3 32.0 - 36.0 g/dL   RDW 14.6 (H) 11.5 - 14.5 %   Platelets 719 (H) 150 - 440 K/uL    Cultures: Results for orders placed or performed during the hospital encounter of 12/30/17  Blood Culture (routine x 2)     Status: None (Preliminary result)   Collection Time: 12/30/17  7:36 PM  Result Value Ref Range Status   Specimen Description BLOOD LAC  Final   Special Requests   Final    BOTTLES DRAWN AEROBIC AND ANAEROBIC Blood Culture adequate volume   Culture   Final    NO GROWTH 2 DAYS Performed at [facility], [address] [place], [place] [address]    Report Status PENDING  Incomplete  Blood Culture (routine x 2)      Status: None (Preliminary result)   Collection Time: 12/30/17  7:52 PM  Result Value Ref Range Status   Specimen Description   Final    BLOOD RIGHT HAND Performed at [facility], [address] [place], [place] [address]    Special Requests   Final    BOTTLES DRAWN AEROBIC AND ANAEROBIC Blood Culture adequate volume Performed at [facility], [address] [place], [place] [address]    Culture  Setup Time   Final    ANAEROBIC BOTTLE ONLY GRAM POSITIVE COCCI CRITICAL RESULT CALLED TO, READ BACK BY AND VERIFIED WITH: JASON ROBBINS AT 1930 ON 12/31/17 RWW Performed at [facility], [address] [place], [place] [address]    Culture GRAM POSITIVE COCCI  Final   Report Status PENDING  Incomplete  Blood Culture ID Panel (Reflexed)     Status: Abnormal   Collection Time: 12/30/17  7:52 PM  Result Value Ref Range Status   Enterococcus species NOT DETECTED NOT DETECTED Final   Listeria monocytogenes NOT DETECTED NOT DETECTED Final   Staphylococcus species DETECTED (A) NOT DETECTED Final    Comment: Methicillin (oxacillin) resistant coagulase negative staphylococcus. Possible blood culture contaminant (unless isolated from more than one blood culture draw or clinical case suggests pathogenicity). No antibiotic treatment is indicated for blood  culture contaminants. CRITICAL RESULT CALLED TO, READ BACK BY AND VERIFIED WITH: JASON ROBBINS AT 1930 ON 12/31/17 RWW    Staphylococcus aureus NOT DETECTED NOT DETECTED Final   Methicillin resistance DETECTED (A) NOT DETECTED Final    Comment: CRITICAL RESULT CALLED TO, READ BACK BY AND VERIFIED WITH: JASON ROBBINS AT 1930 ON 12/31/17 RWW    Streptococcus species NOT DETECTED NOT DETECTED Final   Streptococcus agalactiae NOT DETECTED NOT DETECTED Final   Streptococcus pneumoniae NOT DETECTED NOT DETECTED Final   Streptococcus pyogenes NOT DETECTED NOT DETECTED Final   Acinetobacter baumannii NOT DETECTED NOT DETECTED  Final   Enterobacteriaceae species NOT DETECTED NOT DETECTED Final   Enterobacter cloacae complex NOT DETECTED NOT DETECTED Final   Escherichia coli NOT DETECTED NOT DETECTED Final   Klebsiella oxytoca NOT DETECTED NOT DETECTED Final   Klebsiella pneumoniae NOT DETECTED NOT DETECTED Final   Proteus species NOT DETECTED NOT DETECTED Final   Serratia marcescens NOT DETECTED NOT DETECTED Final   Haemophilus influenzae NOT DETECTED NOT DETECTED Final   Neisseria meningitidis NOT DETECTED NOT DETECTED Final   Pseudomonas aeruginosa NOT DETECTED NOT DETECTED Final   Candida albicans NOT DETECTED NOT DETECTED Final   Candida glabrata NOT DETECTED  NOT DETECTED Final   Candida krusei NOT DETECTED NOT DETECTED Final   Candida parapsilosis NOT DETECTED NOT DETECTED Final   Candida tropicalis NOT DETECTED NOT DETECTED Final    Comment: Performed at [facility], [address] [place], [place] [address]  Body fluid culture     Status: None (Preliminary result)   Collection Time: 12/31/17  4:10 PM  Result Value Ref Range Status   Specimen Description FLUID PELVIC FLUID  Final   Special Requests   Final    NONE Performed at [facility], [place], [place] [address]    Gram Stain   Final    MODERATE WBC PRESENT, PREDOMINANTLY MONONUCLEAR NO ORGANISMS SEEN    Culture PENDING  Incomplete   Report Status PENDING  Incomplete    Imaging: Ct Abdomen Pelvis W Contrast  Addendum Date: 12/30/2017   ADDENDUM REPORT: 12/30/2017 19:15 ADDENDUM: Moderate gas within the subcutaneous soft tissues of the anterior abdominal wall, possible slow resolution of postoperative gas related to prior hysterectomy. Electronically Signed   By: Donavan Foil M.D.   On: 12/30/2017 19:15   Result Date: 12/30/2017 CLINICAL DATA:  Abdominal pain with constipation EXAM: CT ABDOMEN AND PELVIS WITH CONTRAST TECHNIQUE: Multidetector CT imaging of the abdomen and pelvis was performed using the  standard protocol following bolus administration of intravenous contrast. CONTRAST:  168mL ISOVUE-300 IOPAMIDOL (ISOVUE-300) INJECTION 61% COMPARISON:  CT 02/13/2017, 10/14/2015 FINDINGS: Lower chest: Lung bases demonstrate no acute consolidation or pleural effusion. Heart size within normal limits. Hepatobiliary: Subcentimeter hypodensity within the left hepatic lobe, too small to further characterize but not significantly changed and therefore felt benign. Contracted gallbladder without calcified stones. No biliary dilatation. Pancreas: Unremarkable. No pancreatic ductal dilatation or surrounding inflammatory changes. Spleen: Normal in size without focal abnormality. Adrenals/Urinary Tract: Adrenal glands are within normal limits. Kidneys show no hydronephrosis. The bladder is unremarkable. Stomach/Bowel: Stomach is nonenlarged. No dilated small bowel. No definite colon wall thickening. Vascular/Lymphatic: Nonaneurysmal aorta. Mild aortic atherosclerosis. Mild retroperitoneal lymph nodes measuring to 9 mm. Patient is status post endovascular coiling of right inferior epigastric artery. Reproductive: Status post hysterectomy.  No adnexal masses. Other: Negative for free air. Heterogenous soft tissue density posterior to the urinary bladder within the pelvis. This contains multiple irregular fluid collections. The dominant collection within the posterior pelvis measures 7 x 4.6 cm and exerts mass effect on the rectosigmoid colon. Moderate gas within the subcutaneous soft tissues of the anterior abdominal wall. Musculoskeletal: No acute or suspicious bone lesion. IMPRESSION: 1. Patient is status post hysterectomy. Ill-defined soft tissue density within the pelvis, posterior to the urinary bladder which may reflect combination of hematoma and postsurgical changes. Multiple irregular rim enhancing fluid collections in the region with a dominant 7 cm fluid collection in the posterior pelvis that is exerting mass effect  on the rectosigmoid colon. Findings could be secondary to organizing postoperative fluid collection or hematoma, with or without the presence of infection. 2. Negative for small bowel obstruction 3. Interval endovascular coiling of right inferior epigastric artery Electronically Signed: By: Donavan Foil M.D. On: 12/30/2017 18:55     Assessment   52 y.o. [place] Hospital Day: 3  1. Abcess vs. Hematoma from previous surgery:LapHyst 2. Chronic pain from 3 prior Cervical surgeries 3. Chronic narcotic dependence  4. Constipation 5. MRSA on blood cultures and on Vancomycin and Zosyn  Plan   1. Continue Antibiotics for MRSA Bacteriemia 2.Continue drain by IR Lt buttocks. 3. Ambulate and  BRP. 4. Continue inpatient admission 5. Pain meds as prescribed.  Glenis Smoker, RN, MSN, CNM, [place]

## 2018-01-02 LAB — CULTURE, BLOOD (ROUTINE X 2): Special Requests: ADEQUATE

## 2018-01-02 LAB — VANCOMYCIN, TROUGH: Vancomycin Tr: 52 ug/mL (ref 15–20)

## 2018-01-02 NOTE — Progress Notes (Addendum)
Obstetric and Gynecology  Subjective  [name] is a 51 y.o. female [id number] who presented on 12/30/2017 for abcess vs hematoma (CT showed fluid collection) admitted on 12/30/17 for antibiotics, pain meds and consult to Interventional Radiology. Pt formerly had a Lap HYST on 12/15/17 and pelvic sentinel node biopsies for atypical glandular cells. Pathy was negative for malignancy. Complications of the surgery included: Rt inf epigastric artery interruption which required IR to embolize two brances of Rt inferior epigastric. Pt ahs chronic pain from her 3 prior neck sugeries and taking Oxycodone 10 mg po q 4 hours prescribed by her pain clinic. Pt has had constipation and did take an enema prior to coming to the hospital with results. Pt is awake and talking and informed me she is on Disability.  Objective   Vitals:   01/02/18 0400 01/02/18 0513  BP:  95/65  Pulse:  89  Resp: 12 18  Temp:  98.9 F (37.2 C)  SpO2: 98% 100%    Intake/Output Summary (Last 24 hours) at 01/02/2018 0928 Last data filed at 01/02/2018 0500 Gross per 24 hour  Intake 3690.97 ml  Output 1020 ml  Net 2670.97 ml    General: NAD, A,A& O x 3 Cardiovascular: RRR, no murmurs, S1S2,  Pulmonary: CTAB, no W/R/R Abdomen: Benign. Non-tender, +BS, no guarding. Distended lower abd, no BM x 1 week.  Incisions:Lap Hyst incisions are healing and intact, Ecchymosis on Rt radiating to her back and down the Rt thigh & pubic area Drain: JP drain intact to Lt buttock with sero-sang clear dc.  Extremities: No erythema or cords, no calf tenderness, +warmth with normal peripheral pulses.  Labs: No results found for this or any previous visit (from the past 24 hour(s)).  Cultures: Results for orders placed or performed during the hospital encounter of 12/30/17  Blood Culture (routine x 2)     Status: None (Preliminary result)   Collection Time: 12/30/17  7:36 PM  Result Value Ref Range Status   Specimen Description BLOOD LAC   Final   Special Requests   Final    BOTTLES DRAWN AEROBIC AND ANAEROBIC Blood Culture adequate volume   Culture   Final    NO GROWTH 3 DAYS Performed at [facility], [address] [place], [place] [address]    Report Status PENDING  Incomplete  Blood Culture (routine x 2)     Status: Abnormal   Collection Time: 12/30/17  7:52 PM  Result Value Ref Range Status   Specimen Description   Final    BLOOD RIGHT HAND Performed at [facility], [address], [place], [place] [address]    Special Requests   Final    BOTTLES DRAWN AEROBIC AND ANAEROBIC Blood Culture adequate volume Performed at [facility], [address] [name], [place] [address]    Culture  Setup Time   Final    ANAEROBIC BOTTLE ONLY GRAM POSITIVE COCCI CRITICAL RESULT CALLED TO, READ BACK BY AND VERIFIED WITH: JASON ROBBINS AT 1930 ON 12/31/17 RWW    Culture (A)  Final    STAPHYLOCOCCUS SPECIES (COAGULASE NEGATIVE) THE SIGNIFICANCE OF ISOLATING THIS ORGANISM FROM A SINGLE SET OF BLOOD CULTURES WHEN MULTIPLE SETS ARE DRAWN IS UNCERTAIN. PLEASE NOTIFY THE MICROBIOLOGY DEPARTMENT WITHIN ONE WEEK IF SPECIATION AND SENSITIVITIES ARE REQUIRED. Performed at [facility], [place] [address] [place], [place] [address]    Report Status 01/02/2018 FINAL  Final  Blood Culture ID Panel (Reflexed)  Status: Abnormal   Collection Time: 12/30/17  7:52 PM  Result Value Ref Range Status   Enterococcus species NOT DETECTED NOT DETECTED Final   Listeria monocytogenes NOT DETECTED NOT DETECTED Final   Staphylococcus species DETECTED (A) NOT DETECTED Final    Comment: Methicillin (oxacillin) resistant coagulase negative staphylococcus. Possible blood culture contaminant (unless isolated from more than one blood culture draw or clinical case suggests pathogenicity). No antibiotic treatment is indicated for blood  culture contaminants. CRITICAL RESULT CALLED TO, READ BACK BY AND VERIFIED WITH: JASON  ROBBINS AT 1930 ON 12/31/17 RWW    Staphylococcus aureus NOT DETECTED NOT DETECTED Final   Methicillin resistance DETECTED (A) NOT DETECTED Final    Comment: CRITICAL RESULT CALLED TO, READ BACK BY AND VERIFIED WITH: JASON ROBBINS AT 1930 ON 12/31/17 RWW    Streptococcus species NOT DETECTED NOT DETECTED Final   Streptococcus agalactiae NOT DETECTED NOT DETECTED Final   Streptococcus pneumoniae NOT DETECTED NOT DETECTED Final   Streptococcus pyogenes NOT DETECTED NOT DETECTED Final   Acinetobacter baumannii NOT DETECTED NOT DETECTED Final   Enterobacteriaceae species NOT DETECTED NOT DETECTED Final   Enterobacter cloacae complex NOT DETECTED NOT DETECTED Final   Escherichia coli NOT DETECTED NOT DETECTED Final   Klebsiella oxytoca NOT DETECTED NOT DETECTED Final   Klebsiella pneumoniae NOT DETECTED NOT DETECTED Final   Proteus species NOT DETECTED NOT DETECTED Final   Serratia marcescens NOT DETECTED NOT DETECTED Final   Haemophilus influenzae NOT DETECTED NOT DETECTED Final   Neisseria meningitidis NOT DETECTED NOT DETECTED Final   Pseudomonas aeruginosa NOT DETECTED NOT DETECTED Final   Candida albicans NOT DETECTED NOT DETECTED Final   Candida glabrata NOT DETECTED NOT DETECTED Final   Candida krusei NOT DETECTED NOT DETECTED Final   Candida parapsilosis NOT DETECTED NOT DETECTED Final   Candida tropicalis NOT DETECTED NOT DETECTED Final    Comment: Performed at [facility], [address] [place], [place] [address]  Body fluid culture     Status: None (Preliminary result)   Collection Time: 12/31/17  4:10 PM  Result Value Ref Range Status   Specimen Description FLUID PELVIC FLUID  Final   Special Requests   Final    NONE Performed at [facility], [place], [place] [address]    Gram Stain   Final    MODERATE WBC PRESENT, PREDOMINANTLY MONONUCLEAR NO ORGANISMS SEEN    Culture PENDING  Incomplete   Report Status PENDING  Incomplete     Imaging: Ct Abdomen Pelvis W Contrast  Addendum Date: 12/30/2017   ADDENDUM REPORT: 12/30/2017 19:15 ADDENDUM: Moderate gas within the subcutaneous soft tissues of the anterior abdominal wall, possible slow resolution of postoperative gas related to prior hysterectomy. Electronically Signed   By: Donavan Foil M.D.   On: 12/30/2017 19:15   Result Date: 12/30/2017 CLINICAL DATA:  Abdominal pain with constipation EXAM: CT ABDOMEN AND PELVIS WITH CONTRAST TECHNIQUE: Multidetector CT imaging of the abdomen and pelvis was performed using the standard protocol following bolus administration of intravenous contrast. CONTRAST:  151mL ISOVUE-300 IOPAMIDOL (ISOVUE-300) INJECTION 61% COMPARISON:  CT 02/13/2017, 10/14/2015 FINDINGS: Lower chest: Lung bases demonstrate no acute consolidation or pleural effusion. Heart size within normal limits. Hepatobiliary: Subcentimeter hypodensity within the left hepatic lobe, too small to further characterize but not significantly changed and therefore felt benign. Contracted gallbladder without calcified stones. No biliary dilatation. Pancreas: Unremarkable. No pancreatic ductal dilatation or surrounding inflammatory changes. Spleen: Normal in size without focal  abnormality. Adrenals/Urinary Tract: Adrenal glands are within normal limits. Kidneys show no hydronephrosis. The bladder is unremarkable. Stomach/Bowel: Stomach is nonenlarged. No dilated small bowel. No definite colon wall thickening. Vascular/Lymphatic: Nonaneurysmal aorta. Mild aortic atherosclerosis. Mild retroperitoneal lymph nodes measuring to 9 mm. Patient is status post endovascular coiling of right inferior epigastric artery. Reproductive: Status post hysterectomy.  No adnexal masses. Other: Negative for free air. Heterogenous soft tissue density posterior to the urinary bladder within the pelvis. This contains multiple irregular fluid collections. The dominant collection within the posterior pelvis measures 7 x  4.6 cm and exerts mass effect on the rectosigmoid colon. Moderate gas within the subcutaneous soft tissues of the anterior abdominal wall. Musculoskeletal: No acute or suspicious bone lesion. IMPRESSION: 1. Patient is status post hysterectomy. Ill-defined soft tissue density within the pelvis, posterior to the urinary bladder which may reflect combination of hematoma and postsurgical changes. Multiple irregular rim enhancing fluid collections in the region with a dominant 7 cm fluid collection in the posterior pelvis that is exerting mass effect on the rectosigmoid colon. Findings could be secondary to organizing postoperative fluid collection or hematoma, with or without the presence of infection. 2. Negative for small bowel obstruction 3. Interval endovascular coiling of right inferior epigastric artery Electronically Signed: By: Donavan Foil M.D. On: 12/30/2017 18:55     Assessment   52 y.o. [facility] 1. Abcess vs. Hematoma from previous surgery:LapHyst 2. Chronic pain from 3 prior Cervical surgeries 3. Chronic narcotic dependence  4. Constipation 5. MRSA on blood cultures and on Vancomycin and Zosyn  Plan   1. Continue Antibiotics for MRSA Bacteriemia 2.Continue drain by IR Lt buttocks. 3. Ambulate and BRP. 4. Continue inpatient admission 5. Pain meds as prescribed.  6. Consider SSE to motivate bowels Glenis Smoker, RN, MSN, CNM, FNP [facility] [place]

## 2018-01-02 NOTE — Progress Notes (Addendum)
ANTIBIOTIC CONSULT NOTE -   Pharmacy Consult for Zosyn and vancomycin Indication: intra - abdominal   Allergies  Allergen Reactions  . Naproxen Anaphylaxis    Patient Measurements: Height: 5\' 1"  (154.9 cm) Weight: 114 lb (51.7 kg) IBW/kg (Calculated) : 47.8 Adjusted Body Weight:   Vital Signs: Temp: 97.5 F (36.4 C) (03/10 1147) Temp Source: Oral (03/10 1147) BP: 99/64 (03/10 1147) Pulse Rate: 92 (03/10 1147) Intake/Output from previous day: 03/09 0701 - 03/10 0700 In: 3691 [P.O.:720; I.V.:2923; IV Piggyback:43] Out: 29 [Urine:1000; Drains:50] Intake/Output from this shift: Total I/O In: 669.4 [P.O.:240; I.V.:379.4; IV Piggyback:50] Out: 500 [Urine:500]  Labs: Recent Labs    12/30/17 1751 12/31/17 0432 01/01/18 0447  WBC 15.0* 11.9* 14.8*  HGB 11.0* 9.5* 9.3*  PLT 829* 699* 719*  CREATININE 0.80  --  0.86   Estimated Creatinine Clearance: 57.7 mL/min (by C-G formula based on SCr of 0.86 mg/dL). Recent Labs    01/02/18 1406  [place] 16*     Microbiology: Recent Results (from the past 720 hour(s))  Urine culture     Status: None   Collection Time: 12/29/17 11:28 AM  Result Value Ref Range Status   Specimen Description   Final    URINE, CLEAN CATCH Performed at [facility], [address] [place], [place] [address]    Special Requests   Final    NONE Performed at [facility], [address] [place], [place] [address]    Culture   Final    NO GROWTH Performed at [facility], [place] [address], [place], [place] [address]    Report Status 12/30/2017 FINAL  Final  Blood Culture (routine x 2)     Status: None (Preliminary result)   Collection Time: 12/30/17  7:36 PM  Result Value Ref Range Status   Specimen Description BLOOD LAC  Final   Special Requests   Final    BOTTLES DRAWN AEROBIC AND ANAEROBIC Blood Culture adequate volume   Culture   Final    NO GROWTH 3 DAYS Performed at [facility], [address] [place], [name] [address]    Report Status PENDING  Incomplete  Blood Culture (routine x 2)     Status: Abnormal   Collection Time: 12/30/17  7:52 PM  Result Value Ref Range Status   Specimen Description   Final    BLOOD RIGHT HAND Performed at [facility], [address], [place], [place] [address]    Special Requests   Final    BOTTLES DRAWN AEROBIC AND ANAEROBIC Blood Culture adequate volume Performed at [facility], [address] [place],  [address]    Culture  Setup Time   Final    ANAEROBIC BOTTLE ONLY GRAM POSITIVE COCCI CRITICAL RESULT CALLED TO, READ BACK BY AND VERIFIED WITH: JASON ROBBINS AT 1930 ON 12/31/17 RWW    Culture (A)  Final    STAPHYLOCOCCUS SPECIES (COAGULASE NEGATIVE) THE SIGNIFICANCE OF ISOLATING THIS ORGANISM FROM A SINGLE SET OF BLOOD CULTURES WHEN MULTIPLE SETS ARE DRAWN IS UNCERTAIN. PLEASE NOTIFY THE MICROBIOLOGY DEPARTMENT WITHIN ONE WEEK IF SPECIATION AND SENSITIVITIES ARE REQUIRED. Performed at [facility], [place] [address] [place],  [address]    Report Status 01/02/2018 FINAL  Final  Blood Culture ID Panel (Reflexed)     Status: Abnormal   Collection Time: 12/30/17  7:52 PM  Result Value Ref Range Status   Enterococcus species NOT DETECTED NOT DETECTED Final  Listeria monocytogenes NOT DETECTED NOT DETECTED Final   Staphylococcus species DETECTED (A) NOT DETECTED Final    Comment: Methicillin (oxacillin) resistant coagulase negative staphylococcus. Possible blood culture contaminant (unless isolated from more than one blood culture draw or clinical case suggests pathogenicity). No antibiotic treatment is indicated for blood  culture contaminants. CRITICAL RESULT CALLED TO, READ BACK BY AND VERIFIED WITH: JASON ROBBINS AT 1930 ON 12/31/17 RWW    Staphylococcus aureus NOT DETECTED NOT DETECTED Final   Methicillin resistance DETECTED (A) NOT DETECTED Final    Comment: CRITICAL RESULT CALLED TO, READ BACK BY  AND VERIFIED WITH: JASON ROBBINS AT 1930 ON 12/31/17 RWW    Streptococcus species NOT DETECTED NOT DETECTED Final   Streptococcus agalactiae NOT DETECTED NOT DETECTED Final   Streptococcus pneumoniae NOT DETECTED NOT DETECTED Final   Streptococcus pyogenes NOT DETECTED NOT DETECTED Final   Acinetobacter baumannii NOT DETECTED NOT DETECTED Final   Enterobacteriaceae species NOT DETECTED NOT DETECTED Final   Enterobacter cloacae complex NOT DETECTED NOT DETECTED Final   Escherichia coli NOT DETECTED NOT DETECTED Final   Klebsiella oxytoca NOT DETECTED NOT DETECTED Final   Klebsiella pneumoniae NOT DETECTED NOT DETECTED Final   Proteus species NOT DETECTED NOT DETECTED Final   Serratia marcescens NOT DETECTED NOT DETECTED Final   Haemophilus influenzae NOT DETECTED NOT DETECTED Final   Neisseria meningitidis NOT DETECTED NOT DETECTED Final   Pseudomonas aeruginosa NOT DETECTED NOT DETECTED Final   Candida albicans NOT DETECTED NOT DETECTED Final   Candida glabrata NOT DETECTED NOT DETECTED Final   Candida krusei NOT DETECTED NOT DETECTED Final   Candida parapsilosis NOT DETECTED NOT DETECTED Final   Candida tropicalis NOT DETECTED NOT DETECTED Final    Comment: Performed at [facility], [address] [place], [place] [address]  Body fluid culture     Status: None (Preliminary result)   Collection Time: 12/31/17  4:10 PM  Result Value Ref Range Status   Specimen Description   Final    FLUID PELVIC FLUID Performed at [facility], [address], [place], [place] [address]    Special Requests   Final    NONE Performed at [facility], [address] [place], [place] [address]    Gram Stain   Final    MODERATE WBC PRESENT, PREDOMINANTLY MONONUCLEAR NO ORGANISMS SEEN    Culture   Final    NO GROWTH 1 DAY Performed at [facility], [address] [place], [place] [address]    Report Status PENDING  Incomplete    Medical History: Past  Medical History:  Diagnosis Date  . Abnormal uterine bleeding   . ADHD (attention deficit hyperactivity disorder)   . Allergy   . Anxiety   . Bipolar 1 disorder ([place])   . Bipolar disorder ([place])   . DDD (degenerative disc disease)   . Depression   . HSV infection   . Hx: UTI (urinary tract infection)   . Pertussis   . Seizures ([place])    last seizure 12/2014   . Thyroid disease     Medications:  Medications Prior to Admission  Medication Sig Dispense Refill Last Dose  . 5-Hydroxytryptophan (5-HTP) 100 MG CAPS Take 100 mg by mouth daily.    N/A at N/A  . acetaminophen (TYLENOL) 500 MG tablet Take 500-1,000 mg by mouth every 6 (six) hours as needed for mild pain or headache.    PRN at PRN  . acyclovir (ZOVIRAX) 400 MG  tablet Take 400 mg by mouth twice daily  9 N/A at N/A  . amphetamine-dextroamphetamine (ADDERALL XR) 20 MG 24 hr capsule Take 40 mg by mouth daily.   N/A at N/A  . Artificial Tear Ointment (DRY EYES OP) Place 1 drop into both eyes daily as needed (for dry eyes).   PRN at PRN  . Ascorbic Acid (VITAMIN C) 1000 MG tablet Take 1,000 mg by mouth daily.   N/A at N/A  . aspirin EC 81 MG EC tablet Take 1 tablet (81 mg total) by mouth daily. 30 tablet 0 12/30/2017 at AM  . Aspirin-Salicylamide-Caffeine (BC HEADACHE POWDER PO) Take 1 packet by mouth daily as needed (for pain or headache).    PRN at PRN  . cholecalciferol (VITAMIN D) 1000 units tablet Take 1,000 Units by mouth daily.   N/A at N/A  . Cranberry 1000 MG CAPS Take 1,000 mg by mouth daily.    N/A at N/A  . cyclobenzaprine (FLEXERIL) 5 MG tablet Take 5 mg by mouth 3 (three) times daily as needed for muscle spasms.   2 PRN at PRN  . DULoxetine (CYMBALTA) 20 MG capsule Take 40 mg by mouth daily.   N/A at N/A  . EPINEPHrine (EPIPEN 2-PAK) 0.3 mg/0.3 mL IJ SOAJ injection Inject 0.3 mg into the muscle once as needed (severe allergic reaction).   UTD at UTD  . gabapentin (NEURONTIN) 300 MG capsule Take 1 capsule (300 mg total) by  mouth 3 (three) times daily. 300mg  morning, afternoon and 900mg  before bed 150 capsule 3 N/A at N/A  . lamoTRIgine (LAMICTAL) 100 MG tablet Take 100 mg by mouth every morning.    12/30/2017 at AM  . linaclotide (LINZESS) 145 MCG CAPS capsule Take 1 capsule (145 mcg total) by mouth daily before breakfast. 30 capsule 0 N/A at N/A  . Magnesium 200 MG TABS Take 200 mg by mouth every morning.    N/A at N/A  . milk thistle 175 MG tablet Take 175 mg by mouth 2 (two) times daily.    N/A at N/A  . ondansetron (ZOFRAN) 4 MG tablet Take 1 tablet (4 mg total) by mouth every 8 (eight) hours as needed for nausea or vomiting. 10 tablet 0 PRN at PRN  . Oxycodone HCl 10 MG TABS Take 10 mg by mouth 4 (four) times daily as needed.   N/A at N/A  . traZODone (DESYREL) 100 MG tablet Take 1 tablet (100 mg total) by mouth at bedtime as needed for sleep. 30 tablet 0 PRN at PRN  . lidocaine (LIDODERM) 5 % Place 1 patch onto the skin daily. Remove & Discard patch within 12 hours or as directed by MD (Patient not taking: Reported on 12/29/2017) 30 patch 0 -- at --  . traMADol (ULTRAM) 50 MG tablet Take 1 tablet (50 mg total) by mouth every 6 (six) hours as needed. (Patient not taking: Reported on 11/19/2017) 20 tablet 0 -- at --   Assessment: CrCl = 62.1 ml/min   Goal of Therapy:  resolution of infection  Plan:  Expected duration 7 days with resolution of temperature and/or normalization of WBC   Zosyn 3.375 gm IV Q8H EI ordered to start on 3/8 @ 0200.   DW 52kg  Vd 36L kei 0.056 hr-1  T1/2 12 hours Vancomycin 1 gram q 18 hours ordered with stacked dosing. Level before 5th dose. Goal trough 15-20.  3/10:  Vancomycin level= 52 mcg/ml. * NOTE- Vancomycin hung at 1306 and level drawn  at 1406* so this is not a trough level. Will order another trough level prior to next dose tomorrow am.  03/11 @ 0747 VT 7 subtherapeutic. Will increase dose to vanc 750 mg IV q12h and recheck VT 03/13 @ 1700 prior to 4th dose. Renal function  stable.  Tobie Lords, PharmD, BCPS Clinical Pharmacist 01/04/2018

## 2018-01-03 LAB — CBC
HCT: 27.4 % — ABNORMAL LOW (ref 35.0–47.0)
Hemoglobin: 8.9 g/dL — ABNORMAL LOW (ref 12.0–16.0)
MCH: 29.5 pg (ref 26.0–34.0)
MCHC: 32.6 g/dL (ref 32.0–36.0)
MCV: 90.6 fL (ref 80.0–100.0)
PLATELETS: 666 10*3/uL — AB (ref 150–440)
RBC: 3.02 MIL/uL — ABNORMAL LOW (ref 3.80–5.20)
RDW: 14.6 % — AB (ref 11.5–14.5)
WBC: 12.1 10*3/uL — ABNORMAL HIGH (ref 3.6–11.0)

## 2018-01-03 LAB — BASIC METABOLIC PANEL
ANION GAP: 8 (ref 5–15)
BUN: 9 mg/dL (ref 6–20)
CALCIUM: 8.3 mg/dL — AB (ref 8.9–10.3)
CO2: 25 mmol/L (ref 22–32)
CREATININE: 0.7 mg/dL (ref 0.44–1.00)
Chloride: 106 mmol/L (ref 101–111)
GLUCOSE: 96 mg/dL (ref 65–99)
Potassium: 4 mmol/L (ref 3.5–5.1)
Sodium: 139 mmol/L (ref 135–145)

## 2018-01-03 LAB — VANCOMYCIN, TROUGH: Vancomycin Tr: 7 ug/mL — ABNORMAL LOW (ref 15–20)

## 2018-01-04 ENCOUNTER — Inpatient Hospital Stay: Payer: Medicare HMO

## 2018-01-04 DIAGNOSIS — R109 Unspecified abdominal pain: Secondary | ICD-10-CM

## 2018-01-04 DIAGNOSIS — K651 Peritoneal abscess: Secondary | ICD-10-CM

## 2018-01-04 DIAGNOSIS — C541 Malignant neoplasm of endometrium: Secondary | ICD-10-CM

## 2018-01-04 DIAGNOSIS — K5900 Constipation, unspecified: Secondary | ICD-10-CM

## 2018-01-04 LAB — BODY FLUID CULTURE: Culture: NO GROWTH

## 2018-01-04 LAB — CULTURE, BLOOD (ROUTINE X 2)
Culture: NO GROWTH
Special Requests: ADEQUATE

## 2018-01-04 MED ORDER — FUROSEMIDE 10 MG/ML IJ SOLN
20.0000 mg | Freq: Two times a day (BID) | INTRAMUSCULAR | Status: AC
  Administered 2018-01-04 (×2): 20 mg via INTRAVENOUS
  Filled 2018-01-04 (×2): qty 4

## 2018-01-04 MED ORDER — ENSURE ENLIVE PO LIQD
237.0000 mL | Freq: Two times a day (BID) | ORAL | Status: DC
  Administered 2018-01-05 (×2): 237 mL via ORAL

## 2018-01-04 MED ORDER — OXYCODONE HCL 5 MG PO TABS
10.0000 mg | ORAL_TABLET | ORAL | Status: DC | PRN
  Administered 2018-01-04 – 2018-01-05 (×5): 10 mg via ORAL
  Filled 2018-01-04 (×5): qty 2

## 2018-01-04 MED ORDER — VANCOMYCIN HCL IN DEXTROSE 750-5 MG/150ML-% IV SOLN
750.0000 mg | Freq: Two times a day (BID) | INTRAVENOUS | Status: DC
  Filled 2018-01-04 (×2): qty 150

## 2018-01-04 MED ORDER — IOPAMIDOL (ISOVUE-300) INJECTION 61%
100.0000 mL | Freq: Once | INTRAVENOUS | Status: AC | PRN
  Administered 2018-01-04: 100 mL via INTRAVENOUS

## 2018-01-04 MED ORDER — ADULT MULTIVITAMIN W/MINERALS CH
1.0000 | ORAL_TABLET | Freq: Every day | ORAL | Status: DC
  Administered 2018-01-04 – 2018-01-05 (×2): 1 via ORAL
  Filled 2018-01-04 (×2): qty 1

## 2018-01-04 MED ORDER — VANCOMYCIN HCL IN DEXTROSE 1-5 GM/200ML-% IV SOLN: 1000.0000 mg | Freq: Once | INTRAVENOUS | Status: DC

## 2018-01-04 MED ORDER — POLYETHYLENE GLYCOL 3350 17 G PO PACK
17.0000 g | PACK | Freq: Two times a day (BID) | ORAL | Status: DC
  Administered 2018-01-04 – 2018-01-05 (×3): 17 g via ORAL
  Filled 2018-01-04 (×3): qty 1

## 2018-01-04 NOTE — Progress Notes (Signed)
CT reviewed with Dr. Annamaria Boots Fluid collection resolved. Drain pulled at bedside, no complications.  Ascencion Dike PA-C Interventional Radiology 01/04/2018 11:50 AM
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
## 2018-01-04 NOTE — Progress Notes (Signed)
Patient continued to process health concerns and the death of her mother.  Patient asked for a return visit on 01/05/2018.

## 2018-01-04 NOTE — Progress Notes (Signed)
Nutrition Follow Up Note   DOCUMENTATION CODES:   Severe malnutrition in context of acute illness/injury  INTERVENTION:   Ensure Enlive po BID, each supplement provides 350 kcal and 20 grams of protein  MVI daily  Bowel regimen per MD  NUTRITION DIAGNOSIS:   Severe Malnutrition related to acute illness(infection, abscess s/p laparoscopic hysterectomy) as evidenced by energy intake < or equal to 50% for > or equal to 5 days, moderate muscle depletion, mild fat depletion.  GOAL:   Patient will meet greater than or equal to 90% of their needs  -progressing  MONITOR:   PO intake, Supplement acceptance, Weight trends, Labs, I & O's    ASSESSMENT:   52 year old female who presented to ED with severe diffuse abdominal pain and constipation after recent laparoscopic hysterectomy. Pt with PMH significant for anxiety, bipolar disorder, depression, seizures, and thyroid disease.   Pt s/p CT guided pelvic drain placement 3/8  Pt with improved appetite and oral intake; eating 70-100% of meals. RD will add Ensure and MVI to help pt meet her estimated needs. No new weight since admit; recommend obtain new weight.   Medications reviewed and include: hydromorphone, NaCl w/ 5% dextrose @125ml /hr, vancomycin  Labs reviewed:   Diet Order:  Diet regular Room service appropriate? Yes; Fluid consistency: Thin  EDUCATION NEEDS:   No education needs have been identified at this time  Skin:  Skin Assessment: Skin Integrity Issues: Skin Integrity Issues:: Incisions, Other (Comment) Incisions: abdomen Other: ecchymosis to abdomen and arm  Last BM:  3/11- type 7  Height:   Ht Readings from Last 1 Encounters:  12/30/17 5\' 1"  (1.549 m)    Weight:   Wt Readings from Last 1 Encounters:  12/30/17 114 lb (51.7 kg)    Ideal Body Weight:  47.7 kg  BMI:  Body mass index is 21.54 kg/m.  Estimated Nutritional Needs:   Kcal:  1500-1700 kcal/day  Protein:  60-75 grams/day  Fluid:   1.5-1.7 L/day  Koleen Distance MS, RD, LDN Pager #- [phone] After Hours Pager: [phone]

## 2018-01-04 NOTE — Progress Notes (Signed)
Patient has recently lost her mother and requests continued pastoral care to help her process the loss.

## 2018-01-04 NOTE — Progress Notes (Signed)
Chief Complaint: Patient was seen today for follow up pelvic abscess drain   Supervising Physician: Daryll Brod  Patient Status: [facility] - In-pt  Subjective: S/p perc drain to pelvic fluid collection 3/8 Cx negative so far, though 1/2 blood Cxs + for Staph. Pt feeling much better since drain. Not much output past 2 days  Objective: Physical Exam: BP 116/65 (BP Location: Left Arm)   Pulse 77   Temp 98.2 F (36.8 C) (Oral)   Resp 13   Ht 5\' 1"  (1.549 m)   Wt 114 lb (51.7 kg)   LMP 09/22/2017 (Exact Date)   SpO2 100%   BMI 21.54 kg/m  (L)TG drain intact, site clean, dry. Output, scant serosanguinous.   Current Facility-Administered Medications:  .  acetaminophen (TYLENOL) tablet 650 mg, 650 mg, Oral, Q6H PRN **OR** acetaminophen (TYLENOL) suppository 650 mg, 650 mg, Rectal, Q6H PRN, Catheryn Bacon, CNM .  acetaminophen (TYLENOL) tablet 1,000 mg, 1,000 mg, Oral, Q6H, Ward, Chelsea C, MD, 1,000 mg at 01/04/18 0549 .  dextrose 5 %-0.9 % sodium chloride infusion, , Intravenous, Continuous, Catheryn Bacon, CNM, Last Rate: 125 mL/hr at 01/04/18 0413 .  diphenhydrAMINE (BENADRYL) injection 12.5 mg, 12.5 mg, Intravenous, Q6H PRN **OR** diphenhydrAMINE (BENADRYL) 12.5 MG/5ML elixir 12.5 mg, 12.5 mg, Oral, Q6H PRN, Benjaman Kindler, MD .  DULoxetine (CYMBALTA) DR capsule 40 mg, 40 mg, Oral, Daily, Ward, [name], MD, 40 mg at 01/03/18 0858 .  gabapentin (NEURONTIN) capsule 300 mg, 300 mg, Oral, BID, Ward, Chelsea C, MD, 300 mg at 01/03/18 1621 .  gabapentin (NEURONTIN) capsule 900 mg, 900 mg, Oral, QHS, Ward, [name], MD, 900 mg at 01/03/18 2205 .  HYDROmorphone (DILAUDID) 1 mg/mL PCA injection, , Intravenous, Q4H, Benjaman Kindler, MD, 25 mg at 01/03/18 1831 .  lamoTRIgine (LAMICTAL) tablet 100 mg, 100 mg, Oral, BH-q7a, Ward, [name], MD, 100 mg at 01/04/18 0606 .  linaclotide (LINZESS) capsule 145 mcg, 145 mcg, Oral, QAC breakfast, Ward, [name], MD, 145 mcg at 01/03/18  0858 .  naloxone [facility]) injection 0.4 mg, 0.4 mg, Intravenous, PRN **AND** sodium chloride flush (NS) 0.9 % injection 9 mL, 9 mL, Intravenous, PRN, Benjaman Kindler, MD, 9 mL at 01/01/18 2159 .  ondansetron [facility]) injection 4 mg, 4 mg, Intravenous, Q6H PRN, Catheryn Bacon, CNM, 4 mg at 12/31/17 2134 .  ondansetron (ZOFRAN) tablet 4 mg, 4 mg, Oral, Q8H PRN, Ward, Chelsea C, MD .  piperacillin-tazobactam (ZOSYN) IVPB 3.375 g, 3.375 g, Intravenous, Q8H, Benjaman Kindler, MD, Stopped at 01/04/18 0540 .  sodium chloride flush (NS) 0.9 % injection 5 mL, 5 mL, Intracatheter, Q8H, Wagner, Jaime, DO, 5 mL at 01/04/18 0550 .  vancomycin (VANCOCIN) IVPB 750 mg/150 ml premix, 750 mg, Intravenous, Q12H, Catheryn Bacon, CNM  Labs: CBC Recent Labs    01/03/18 0747  WBC 12.1*  HGB 8.9*  HCT 27.4*  PLT 666*   BMET Recent Labs    01/03/18 0433  NA 139  K 4.0  CL 106  CO2 25  GLUCOSE 96  BUN 9  CREATININE 0.70  CALCIUM 8.3*   LFT No results for input(s): PROT, ALBUMIN, AST, ALT, ALKPHOS, BILITOT, BILIDIR, IBILI, LIPASE in the last 72 hours. PT/INR No results for input(s): LABPROT, INR in the last 72 hours.   Studies/Results: No results found.  Assessment/Plan: Post hysterectomy fluid collection S/p perc drain 3/8 Given lack of significant out past 2 days, will check CT pelvis. If fluid collection resolved, will  remove drain today.    LOS: 5 days   I spent a total of 15 minutes in face to face in clinical consultation, greater than 50% of which was counseling/coordinating care for pelvic fluid collection  Ascencion Dike PA-C 01/04/2018 8:43 AM

## 2018-01-05 DIAGNOSIS — Z23 Encounter for immunization: Secondary | ICD-10-CM | POA: Diagnosis not present

## 2018-01-05 MED ORDER — OXYCODONE HCL 10 MG PO TABS: 10.0000 mg | ORAL_TABLET | ORAL | 0 refills | Status: DC | PRN

## 2018-01-05 MED ORDER — ACETAMINOPHEN 500 MG PO TABS: 1000.0000 mg | ORAL_TABLET | Freq: Four times a day (QID) | ORAL | 0 refills | Status: DC

## 2018-01-05 MED ORDER — POLYETHYLENE GLYCOL 3350 17 G PO PACK: 17.0000 g | PACK | Freq: Every day | ORAL | 0 refills | Status: DC

## 2018-01-05 NOTE — Progress Notes (Addendum)
Obstetric and Gynecology   Subjective   Patient doing well, no complaints, tolerating PO intake, tolerating pain with PO meds, ambulating without difficulty, voiding spontaneously.   Using PCA,   Denies CP, SOB, F/C, N/V/D, or leg pain.   Objective  Objective:   Vitals:   01/04/18 2032 01/05/18 0406 01/05/18 0840 01/05/18 1206  BP: 93/74 (!) 104/59 112/63 115/67  Pulse: 77 76 75 79  Resp: 18 20 19 19   Temp: 98.4 F (36.9 C) 98.2 F (36.8 C)  98.3 F (36.8 C)  TempSrc: Oral Oral  Oral  SpO2: 98% 97% 98% 98%  Weight:      Height:       Temp:  [98.2 F (36.8 C)-98.4 F (36.9 C)] 98.3 F (36.8 C) (03/13 1206) Pulse Rate:  [75-79] 79 (03/13 1206) Resp:  [18-20] 19 (03/13 1206) BP: (93-115)/(59-74) 115/67 (03/13 1206) SpO2:  [97 %-98 %] 98 % (03/13 1206) I/O last 3 completed shifts: In: 2801.9 [P.O.:720; I.V.:2081.9] Out: 6600 [Urine:6600] Total I/O In: 478 [P.O.:478] Out: 850 [Urine:850]  Intake/Output Summary (Last 24 hours) at 01/05/2018 1614 Last data filed at 01/05/2018 1506 Gross per 24 hour  Intake 718 ml  Output 1950 ml  Net -1232 ml     Current Vital Signs 24h Vital Sign Ranges  T 98.3 F (36.8 C) Temp  Avg: 98.3 F (36.8 C)  Min: 98.2 F (36.8 C)  Max: 98.4 F (36.9 C)  BP 115/67 BP  Min: 93/74  Max: 115/67  HR 79 Pulse  Avg: 76.8  Min: 75  Max: 79  RR 19 Resp  Avg: 19  Min: 18  Max: 20  SaO2 98 % Room Air SpO2  Avg: 97.8 %  Min: 97 %  Max: 98 %           24 Hour I/O Current Shift I/O  Time Ins Outs 03/12 0701 - 03/13 0700 In: 2556.9 [P.O.:480; I.V.:2076.9] Out: 3800 [Urine:3800] 03/13 0701 - 03/13 1900 In: 478 [P.O.:478] Out: 850 [Urine:850]   General: NAD Cardiovascular: RRR, no murmurs Pulmonary: CTAB, normal respiratory effort Abdomen: Benign. Tender in pelvis, +BS, no guarding. Incisions well healing Extremities: No erythema or cords, no calf tenderness, with normal peripheral pulses.  Labs: No results found for this or any  previous visit (from the past 24 hour(s)).  Cultures: Results for orders placed or performed during the hospital encounter of 12/30/17  Blood Culture (routine x 2)     Status: None   Collection Time: 12/30/17  7:36 PM  Result Value Ref Range Status   Specimen Description BLOOD LAC  Final   Special Requests   Final    BOTTLES DRAWN AEROBIC AND ANAEROBIC Blood Culture adequate volume   Culture   Final    NO GROWTH 5 DAYS Performed at [facility], [address], [place], [place] [address]    Report Status 01/04/2018 FINAL  Final  Blood Culture (routine x 2)     Status: Abnormal   Collection Time: 12/30/17  7:52 PM  Result Value Ref Range Status   Specimen Description   Final    BLOOD RIGHT HAND Performed at [facility], [address], [name], [place] [address]    Special Requests   Final    BOTTLES DRAWN AEROBIC AND ANAEROBIC Blood Culture adequate volume Performed at [facility], [address], [place], [place] [address]    Culture  Setup Time   Final    ANAEROBIC BOTTLE ONLY GRAM POSITIVE  [facility] CRITICAL RESULT CALLED TO, READ BACK BY AND VERIFIED WITH: JASON ROBBINS AT 1930 ON 12/31/17 RWW    Culture (A)  Final    STAPHYLOCOCCUS SPECIES (COAGULASE NEGATIVE) THE SIGNIFICANCE OF ISOLATING THIS ORGANISM FROM A SINGLE SET OF BLOOD CULTURES WHEN MULTIPLE SETS ARE DRAWN IS UNCERTAIN. PLEASE NOTIFY THE MICROBIOLOGY DEPARTMENT WITHIN ONE WEEK IF SPECIATION AND SENSITIVITIES ARE REQUIRED. Performed at [facility], [place] [name], [place], [place] [address]    Report Status 01/02/2018 FINAL  Final  Blood Culture ID Panel (Reflexed)     Status: Abnormal   Collection Time: 12/30/17  7:52 PM  Result Value Ref Range Status   Enterococcus species NOT DETECTED NOT DETECTED Final   Listeria monocytogenes NOT DETECTED NOT DETECTED Final   Staphylococcus species DETECTED (A) NOT DETECTED Final    Comment: Methicillin (oxacillin) resistant coagulase  negative staphylococcus. Possible blood culture contaminant (unless isolated from more than one blood culture draw or clinical case suggests pathogenicity). No antibiotic treatment is indicated for blood  culture contaminants. CRITICAL RESULT CALLED TO, READ BACK BY AND VERIFIED WITH: JASON ROBBINS AT 1930 ON 12/31/17 RWW    Staphylococcus aureus NOT DETECTED NOT DETECTED Final   Methicillin resistance DETECTED (A) NOT DETECTED Final    Comment: CRITICAL RESULT CALLED TO, READ BACK BY AND VERIFIED WITH: JASON ROBBINS AT 1930 ON 12/31/17 RWW    Streptococcus species NOT DETECTED NOT DETECTED Final   Streptococcus agalactiae NOT DETECTED NOT DETECTED Final   Streptococcus pneumoniae NOT DETECTED NOT DETECTED Final   Streptococcus pyogenes NOT DETECTED NOT DETECTED Final   Acinetobacter baumannii NOT DETECTED NOT DETECTED Final   Enterobacteriaceae species NOT DETECTED NOT DETECTED Final   Enterobacter cloacae complex NOT DETECTED NOT DETECTED Final   Escherichia coli NOT DETECTED NOT DETECTED Final   Klebsiella oxytoca NOT DETECTED NOT DETECTED Final   Klebsiella pneumoniae NOT DETECTED NOT DETECTED Final   Proteus species NOT DETECTED NOT DETECTED Final   Serratia marcescens NOT DETECTED NOT DETECTED Final   Haemophilus influenzae NOT DETECTED NOT DETECTED Final   Neisseria meningitidis NOT DETECTED NOT DETECTED Final   Pseudomonas aeruginosa NOT DETECTED NOT DETECTED Final   Candida albicans NOT DETECTED NOT DETECTED Final   Candida glabrata NOT DETECTED NOT DETECTED Final   Candida krusei NOT DETECTED NOT DETECTED Final   Candida parapsilosis NOT DETECTED NOT DETECTED Final   Candida tropicalis NOT DETECTED NOT DETECTED Final    Comment: Performed at [facility], [address] [place], [place] [address]  Body fluid culture     Status: None   Collection Time: 12/31/17  4:10 PM  Result Value Ref Range Status   Specimen Description   Final    FLUID PELVIC FLUID Performed at  [facility], [place], [address] [place], [place] [address]    Special Requests   Final    NONE Performed at [facility], [address] [place], [place] [address]    Gram Stain   Final    MODERATE WBC PRESENT, PREDOMINANTLY MONONUCLEAR NO ORGANISMS SEEN    Culture   Final    NO GROWTH 3 DAYS Performed at [facility], [address], [place],  [address]    Report Status 01/04/2018 FINAL  Final    Imaging: Ct Pelvis W Contrast  Result Date: 01/04/2018 CLINICAL DATA:  Hysterectomy 63/10/6008 complicated by pelvic fluid collection requiring percutaneous drainage, presenting for follow-up. EXAM: CT PELVIS WITH CONTRAST TECHNIQUE: Multidetector CT  imaging of the pelvis was performed using the standard protocol following the bolus administration of intravenous contrast. CONTRAST:  132mL ISOVUE-300 IOPAMIDOL (ISOVUE-300) INJECTION 61% COMPARISON:  12/30/2017 CT abdomen/pelvis. FINDINGS: Urinary Tract:  Normal nondistended bladder. Bowel:  No bowel wall thickening or significant bowel dilatation. Vascular/Lymphatic: Atherosclerotic nonaneurysmal visualized lower abdominal aorta. No acute vascular abnormality. No pathologically enlarged pelvic lymph nodes. Reproductive: Hysterectomy. Interval left gluteal approach percutaneous drain placement, with the drain terminating in the midline deep pelvis at the site of the previously described 7.0 x 4.6 cm pelvic fluid collection, which is resolved. Separate small 2.4 x 1.5 cm hypodense collection at the right vaginal cuff anterior to the drain (series 3/image 28) is decreased mildly from 2.8 x 1.8 cm. No new focal fluid collections in the pelvis. No adnexal masses. Other: Ill-defined small volume free fluid in the presacral space is minimally increased. Mild anasarca is increased. Subcutaneous emphysema throughout the ventral pelvic wall is mildly decreased. Musculoskeletal: No aggressive appearing focal osseous lesions.  IMPRESSION: 1. Dominant deep pelvic fluid collection has resolved status post left gluteal approach percutaneous drain. 2. Separate small hypodense collection at the right vaginal cuff anterior to the drain is mildly decreased in size. No new fluid collections. 3. Increased fluid third-spacing reflected by presacral space free fluid and mild anasarca. 4.  Aortic Atherosclerosis (ICD10-I70.0). Electronically Signed   By: Ilona Sorrel M.D.   On: 01/04/2018 10:44   Ct Abdomen Pelvis W Contrast  Addendum Date: 12/30/2017   ADDENDUM REPORT: 12/30/2017 19:15 ADDENDUM: Moderate gas within the subcutaneous soft tissues of the anterior abdominal wall, possible slow resolution of postoperative gas related to prior hysterectomy. Electronically Signed   By: Donavan Foil M.D.   On: 12/30/2017 19:15   Result Date: 12/30/2017 CLINICAL DATA:  Abdominal pain with constipation EXAM: CT ABDOMEN AND PELVIS WITH CONTRAST TECHNIQUE: Multidetector CT imaging of the abdomen and pelvis was performed using the standard protocol following bolus administration of intravenous contrast. CONTRAST:  113mL ISOVUE-300 IOPAMIDOL (ISOVUE-300) INJECTION 61% COMPARISON:  CT 02/13/2017, 10/14/2015 FINDINGS: Lower chest: Lung bases demonstrate no acute consolidation or pleural effusion. Heart size within normal limits. Hepatobiliary: Subcentimeter hypodensity within the left hepatic lobe, too small to further characterize but not significantly changed and therefore felt benign. Contracted gallbladder without calcified stones. No biliary dilatation. Pancreas: Unremarkable. No pancreatic ductal dilatation or surrounding inflammatory changes. Spleen: Normal in size without focal abnormality. Adrenals/Urinary Tract: Adrenal glands are within normal limits. Kidneys show no hydronephrosis. The bladder is unremarkable. Stomach/Bowel: Stomach is nonenlarged. No dilated small bowel. No definite colon wall thickening. Vascular/Lymphatic: Nonaneurysmal aorta.  Mild aortic atherosclerosis. Mild retroperitoneal lymph nodes measuring to 9 mm. Patient is status post endovascular coiling of right inferior epigastric artery. Reproductive: Status post hysterectomy.  No adnexal masses. Other: Negative for free air. Heterogenous soft tissue density posterior to the urinary bladder within the pelvis. This contains multiple irregular fluid collections. The dominant collection within the posterior pelvis measures 7 x 4.6 cm and exerts mass effect on the rectosigmoid colon. Moderate gas within the subcutaneous soft tissues of the anterior abdominal wall. Musculoskeletal: No acute or suspicious bone lesion. IMPRESSION: 1. Patient is status post hysterectomy. Ill-defined soft tissue density within the pelvis, posterior to the urinary bladder which may reflect combination of hematoma and postsurgical changes. Multiple irregular rim enhancing fluid collections in the region with a dominant 7 cm fluid collection in the posterior pelvis that is exerting mass effect on the rectosigmoid colon. Findings could be secondary  to organizing postoperative fluid collection or hematoma, with or without the presence of infection. 2. Negative for small bowel obstruction 3. Interval endovascular coiling of right inferior epigastric artery Electronically Signed: By: Donavan Foil M.D. On: 12/30/2017 18:55   Ct Image Guided Drainage By Percutaneous Catheter  Result Date: 01/03/2018 INDICATION: 52 year old female with a history of pelvic abscess/fluid after hysterectomy EXAM: CT-GUIDED DRAINAGE OF PELVIC ABSCESS MEDICATIONS: The patient is currently admitted to the hospital and receiving intravenous antibiotics. The antibiotics were administered within an appropriate time frame prior to the initiation of the procedure. ANESTHESIA/SEDATION: 2.0 mg IV Versed 100 mcg IV Fentanyl Moderate Sedation Time:  15 minutes The patient was continuously monitored during the procedure by the interventional radiology  nurse under my direct supervision. COMPLICATIONS: None TECHNIQUE: Informed written consent was obtained from the patient after a thorough discussion of the procedural risks, benefits and alternatives. All questions were addressed. Maximal Sterile Barrier Technique was utilized including caps, mask, sterile gowns, sterile gloves, sterile drape, hand hygiene and skin antiseptic. A timeout was performed prior to the initiation of the procedure. PROCEDURE: The operative field was prepped with Chlorhexidine in a sterile fashion, and a sterile drape was applied covering the operative field. A sterile gown and sterile gloves were used for the procedure. Local anesthesia was provided with 1% Lidocaine. Patient position prone position on the CT gantry table. Scout CT was performed. The patient is then prepped and draped in the usual sterile fashion. 1% lidocaine was used for local anesthesia. Using CT guidance, trocar needle was used to target fluid in the low pelvis via left trans gluteal approach. Modified Seldinger technique was then used to place a 10 [place] drain. Thin serosanguineous fluid was aspirated. Catheter was sutured in position and a final CT image was recorded. Catheter was attached to bulb suction. Patient tolerated the procedure well and remained hemodynamically stable throughout. No complications were encountered and no significant blood loss. FINDINGS: Scout CT demonstrates fluid collection in the pelvis, unchanged in position and relatively unchanged in size. Final CT image demonstrates left trans gluteal drain centered within the fluid collection. IMPRESSION: Status post CT-guided drainage of pelvic abscess. Sample was sent to the lab for culture Signed, Dulcy Fanny. Earleen Newport, DO [facility] Radiology Electronically Signed   By: Corrie Mckusick D.O.   On: 01/03/2018 07:47     Assessment   52 y.o. [id number] s/p TLH BSO with postop pelvic abscess.  S/p IR  drain.   Plan   1. D/C PCA and start PO oxycodone 10mg  - as per home med 2. 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  She has asked me to be her caregiver, from this point forward and after discharge.    I will gladly do this, however have told her I will not prescribe her narcotics after her postop period, and she states she understands this. 3. IR drain removed, d/c abx 4. Mirilax and SS enema to promote BMs.  Her pain is likely exacerbated by constipation 5. Routine and continued inpatient care.  ----- Larey Days, MD Attending Obstetrician and Gynecologist [facility], Department of [facility]   >60 minutes spent with this patient, and >50% was face-to-face.  The remaining was with coordination of care, chart review, results review, communication with consulting providers, and writing this note.

## 2018-01-05 NOTE — Progress Notes (Signed)
Patient discharge teaching given, including activity, diet, follow-up appoints, and medications. Patient verbalized understanding of all discharge instructions. IV access was d/c'd. Vitals are stable. Skin is intact except as charted in most recent assessments. Pt to be escorted out by NT, to be driven home by family.  [name]  

## 2018-01-05 NOTE — Discharge Summary (Addendum)
Gynecology Physician Discharge Summary  Patient ID: [name] MRN: [id number] DOB/AGE: 1965/11/15 52 y.o.  Admit Date: 12/30/2017 Discharge Date: 01/05/2018  Admission diagnosis:  Postop pain, narcotic dependence, pelvic abscess Discharge diagnosis:  same  Procedures: CT scan, IR drain   CBC Latest Ref Rng & Units 01/03/2018 01/01/2018 12/31/2017  WBC 3.6 - 11.0 K/uL 12.1(H) 14.8(H) 11.9(H)  Hemoglobin 12.0 - 16.0 g/dL 8.9(L) 9.3(L) 9.5(L)  Hematocrit 35.0 - 47.0 % 27.4(L) 28.0(L) 29.6(L)  Platelets 150 - 440 K/uL 666(H) 719(H) 699(H)    Hospital Course:  [name] is a 52 y.o. [id number] who presented with pelvic pain, found to have a mass in the pelvis.  Antibiotics were started. Was seen by IR and pelvic drain placed. Removed on POD 2 after minimal drainage and confirmatory CT showed no residual collection. By time of discharge on her pain was controlled on oral pain medications; she was ambulating, voiding without difficulty, tolerating regular diet and passing flatus. She was deemed stable for discharge to home.   Discharge Exam: Blood pressure 115/67, pulse 79, temperature 98.3 F (36.8 C), temperature source Oral, resp. rate 19, height 5\' 1"  (1.549 m), weight 51.7 kg (114 lb), last menstrual period 09/22/2017, SpO2 98 %. General appearance: alert and no distress  Resp: clear to auscultation bilaterally, normal respiratory effort Cardio: regular rate and rhythm  GI: soft, non-tender; bowel sounds normal; no masses, no organomegaly.  Previous ecchymosis resolving. Incision: C/D/I, no erythema, no drainage noted Pelvic: no blood on pad Extremities: extremities normal, atraumatic, no cyanosis or edema and Homans sign is negative, no sign of DVT  Discharged Condition: Stable  Disposition: 01-Home or Self Care  Discharge Instructions    Diet - low sodium heart healthy   Complete by:  As directed    Increase activity slowly   Complete by:  As directed       Allergies as of 01/05/2018      Reactions   Naproxen Anaphylaxis      Medication List    TAKE these medications   5-HTP 100 MG Caps Take 100 mg by mouth daily.   acetaminophen 500 MG tablet Commonly known as:  TYLENOL Take 500-1,000 mg by mouth every 6 (six) hours as needed for mild pain or headache. What changed:  Another medication with the same name was added. Make sure you understand how and when to take each.   acetaminophen 500 MG tablet Commonly known as:  TYLENOL Take 2 tablets (1,000 mg total) by mouth every 6 (six) hours. What changed:  You were already taking a medication with the same name, and this prescription was added. Make sure you understand how and when to take each.   acyclovir 400 MG tablet Commonly known as:  ZOVIRAX Take 400 mg by mouth twice daily   amphetamine-dextroamphetamine 20 MG 24 hr capsule Commonly known as:  ADDERALL XR Take 40 mg by mouth daily.   aspirin 81 MG EC tablet Take 1 tablet (81 mg total) by mouth daily.   BC HEADACHE POWDER PO Take 1 packet by mouth daily as needed (for pain or headache).   cholecalciferol 1000 units tablet Commonly known as:  VITAMIN D Take 1,000 Units by mouth daily.   Cranberry 1000 MG Caps Take 1,000 mg by mouth daily.   cyclobenzaprine 5 MG tablet Commonly known as:  FLEXERIL Take 5 mg by mouth 3 (three) times daily as needed for muscle spasms.   DRY EYES OP Place 1 drop into both  eyes daily as needed (for dry eyes).   DULoxetine 20 MG capsule Commonly known as:  CYMBALTA Take 40 mg by mouth daily.   EPIPEN 2-PAK 0.3 mg/0.3 mL Soaj injection Generic drug:  EPINEPHrine Inject 0.3 mg into the muscle once as needed (severe allergic reaction).   gabapentin 300 MG capsule Commonly known as:  NEURONTIN Take 1 capsule (300 mg total) by mouth 3 (three) times daily. 300mg  morning, afternoon and 900mg  before bed   lamoTRIgine 100 MG tablet Commonly known as:  LAMICTAL Take 100 mg by mouth every  morning.   lidocaine 5 % Commonly known as:  LIDODERM Place 1 patch onto the skin daily. Remove & Discard patch within 12 hours or as directed by MD   linaclotide 145 MCG Caps capsule Commonly known as:  LINZESS Take 1 capsule (145 mcg total) by mouth daily before breakfast.   Magnesium 200 MG Tabs Take 200 mg by mouth every morning.   milk thistle 175 MG tablet Take 175 mg by mouth 2 (two) times daily.   ondansetron 4 MG tablet Commonly known as:  ZOFRAN Take 1 tablet (4 mg total) by mouth every 8 (eight) hours as needed for nausea or vomiting.   Oxycodone HCl 10 MG Tabs Take 10 mg by mouth 4 (four) times daily as needed. What changed:  Another medication with the same name was added. Make sure you understand how and when to take each.   Oxycodone HCl 10 MG Tabs Take 1 tablet (10 mg total) by mouth every 4 (four) hours as needed for moderate pain. What changed:  You were already taking a medication with the same name, and this prescription was added. Make sure you understand how and when to take each.   polyethylene glycol packet Commonly known as:  MIRALAX / GLYCOLAX Take 17 g by mouth daily.   traMADol 50 MG tablet Commonly known as:  ULTRAM Take 1 tablet (50 mg total) by mouth every 6 (six) hours as needed.   traZODone 100 MG tablet Commonly known as:  DESYREL Take 1 tablet (100 mg total) by mouth at bedtime as needed for sleep.   vitamin C 1000 MG tablet Take 1,000 mg by mouth daily.      Follow-up Information    [name], [name], MD Follow up in 1 week(s).   Specialty:  Obstetrics and Gynecology Contact information: [place] [address] [phone]           Signed:  Renner Corner Attending [facility]  Today, 60 minutes were spent with this patient, >50% was face-to-face, discussing discharge, follow up, and importance of re-establishing care with her  neurosurgery group in order to get her narcotics after the postoperative period is complete.  She understands that I will not provide her with another prescription for narcotics.

## 2018-01-05 NOTE — Progress Notes (Signed)
Obstetric and Gynecology   Subjective   Patient doing well, no complaints, tolerating PO intake, ambulating with hesitation, but without difficulty, voiding spontaneously.  Using PCA, and reports pain relief.  The drain in her bottom is uncomfortable.   She has friends at her bedside.  Denies CP, SOB, F/C, N/V/D, or leg pain.   Objective  Objective:   Vitals:   01/04/18 2032 01/05/18 0406 01/05/18 0840 01/05/18 1206  BP: 93/74 (!) 104/59 112/63 115/67  Pulse: 77 76 75 79  Resp: 18 20 19 19   Temp: 98.4 F (36.9 C) 98.2 F (36.8 C)  98.3 F (36.8 C)  TempSrc: Oral Oral  Oral  SpO2: 98% 97% 98% 98%  Weight:      Height:         General: NAD Cardiovascular: RRR, no murmurs Pulmonary: CTAB, normal respiratory effort Abdomen: Benign. Moderately tender, +BS, no guarding. Incisions c/d/i Extremities: No erythema or cords, no calf tenderness, with normal peripheral pulses.   Cultures: Results for orders placed or performed during the hospital encounter of 12/30/17  Blood Culture (routine x 2)     Status: None   Collection Time: 12/30/17  7:36 PM  Result Value Ref Range Status   Specimen Description BLOOD LAC  Final   Special Requests   Final    BOTTLES DRAWN AEROBIC AND ANAEROBIC Blood Culture adequate volume   Culture   Final    NO GROWTH 5 DAYS Performed at [facility], [address], [place], [place] [address]    Report Status 01/04/2018 FINAL  Final  Blood Culture (routine x 2)     Status: Abnormal   Collection Time: 12/30/17  7:52 PM  Result Value Ref Range Status   Specimen Description   Final    BLOOD RIGHT HAND Performed at [facility], [address], [place], [place] [address]    Special Requests   Final    BOTTLES DRAWN AEROBIC AND ANAEROBIC Blood Culture adequate volume Performed at [facility], [address] [place], [place] [address]    Culture  Setup Time   Final    ANAEROBIC BOTTLE ONLY GRAM POSITIVE  [facility] CRITICAL RESULT CALLED TO, READ BACK BY AND VERIFIED WITH: JASON ROBBINS AT 1930 ON 12/31/17 RWW    Culture (A)  Final    STAPHYLOCOCCUS SPECIES (COAGULASE NEGATIVE) THE SIGNIFICANCE OF ISOLATING THIS ORGANISM FROM A SINGLE SET OF BLOOD CULTURES WHEN MULTIPLE SETS ARE DRAWN IS UNCERTAIN. PLEASE NOTIFY THE MICROBIOLOGY DEPARTMENT WITHIN ONE WEEK IF SPECIATION AND SENSITIVITIES ARE REQUIRED. Performed at [facility], [place] [address], [place], [place] [address]    Report Status 01/02/2018 FINAL  Final  Blood Culture ID Panel (Reflexed)     Status: Abnormal   Collection Time: 12/30/17  7:52 PM  Result Value Ref Range Status   Enterococcus species NOT DETECTED NOT DETECTED Final   Listeria monocytogenes NOT DETECTED NOT DETECTED Final   Staphylococcus species DETECTED (A) NOT DETECTED Final    Comment: Methicillin (oxacillin) resistant coagulase negative staphylococcus. Possible blood culture contaminant (unless isolated from more than one blood culture draw or clinical case suggests pathogenicity). No antibiotic treatment is indicated for blood  culture contaminants. CRITICAL RESULT CALLED TO, READ BACK BY AND VERIFIED WITH: JASON ROBBINS AT 1930 ON 12/31/17 RWW    Staphylococcus aureus NOT DETECTED NOT DETECTED Final   Methicillin resistance DETECTED (A) NOT DETECTED Final    Comment: CRITICAL RESULT CALLED TO, READ BACK BY AND VERIFIED  WITH: [place] ON 12/31/17 RWW    Streptococcus species NOT DETECTED NOT DETECTED Final   Streptococcus agalactiae NOT DETECTED NOT DETECTED Final   Streptococcus pneumoniae NOT DETECTED NOT DETECTED Final   Streptococcus pyogenes NOT DETECTED NOT DETECTED Final   Acinetobacter baumannii NOT DETECTED NOT DETECTED Final   Enterobacteriaceae species NOT DETECTED NOT DETECTED Final   Enterobacter cloacae complex NOT DETECTED NOT DETECTED Final   Escherichia coli NOT DETECTED NOT DETECTED Final   Klebsiella oxytoca NOT DETECTED NOT  DETECTED Final   Klebsiella pneumoniae NOT DETECTED NOT DETECTED Final   Proteus species NOT DETECTED NOT DETECTED Final   Serratia marcescens NOT DETECTED NOT DETECTED Final   Haemophilus influenzae NOT DETECTED NOT DETECTED Final   Neisseria meningitidis NOT DETECTED NOT DETECTED Final   Pseudomonas aeruginosa NOT DETECTED NOT DETECTED Final   Candida albicans NOT DETECTED NOT DETECTED Final   Candida glabrata NOT DETECTED NOT DETECTED Final   Candida krusei NOT DETECTED NOT DETECTED Final   Candida parapsilosis NOT DETECTED NOT DETECTED Final   Candida tropicalis NOT DETECTED NOT DETECTED Final    Comment: Performed at [facility], [address] [place], [place] [address]  Body fluid culture     Status: None   Collection Time: 12/31/17  4:10 PM  Result Value Ref Range Status   Specimen Description   Final    FLUID PELVIC FLUID Performed at [facility], [address] [place], [place] [address]    Special Requests   Final    NONE Performed at [facility], [address] [place], [place] [address]    Gram Stain   Final    MODERATE WBC PRESENT, PREDOMINANTLY MONONUCLEAR NO ORGANISMS SEEN    Culture   Final    NO GROWTH 3 DAYS Performed at [facility], [place] [address], [place], [place] [address]    Report Status 01/04/2018 FINAL  Final    Imaging:  Ct Abdomen Pelvis W Contrast  Addendum Date: 12/30/2017   ADDENDUM REPORT: 12/30/2017 19:15 ADDENDUM: Moderate gas within the subcutaneous soft tissues of the anterior abdominal wall, possible slow resolution of postoperative gas related to prior hysterectomy. Electronically Signed   By: Donavan Foil M.D.   On: 12/30/2017 19:15   Result Date: 12/30/2017 CLINICAL DATA:  Abdominal pain with constipation EXAM: CT ABDOMEN AND PELVIS WITH CONTRAST TECHNIQUE: Multidetector CT imaging of the abdomen and pelvis was performed using the standard protocol following bolus administration of intravenous  contrast. CONTRAST:  134mL ISOVUE-300 IOPAMIDOL (ISOVUE-300) INJECTION 61% COMPARISON:  CT 02/13/2017, 10/14/2015 FINDINGS: Lower chest: Lung bases demonstrate no acute consolidation or pleural effusion. Heart size within normal limits. Hepatobiliary: Subcentimeter hypodensity within the left hepatic lobe, too small to further characterize but not significantly changed and therefore felt benign. Contracted gallbladder without calcified stones. No biliary dilatation. Pancreas: Unremarkable. No pancreatic ductal dilatation or surrounding inflammatory changes. Spleen: Normal in size without focal abnormality. Adrenals/Urinary Tract: Adrenal glands are within normal limits. Kidneys show no hydronephrosis. The bladder is unremarkable. Stomach/Bowel: Stomach is nonenlarged. No dilated small bowel. No definite colon wall thickening. Vascular/Lymphatic: Nonaneurysmal aorta. Mild aortic atherosclerosis. Mild retroperitoneal lymph nodes measuring to 9 mm. Patient is status post endovascular coiling of right inferior epigastric artery. Reproductive: Status post hysterectomy.  No adnexal masses. Other: Negative for free air. Heterogenous soft tissue density posterior to the urinary bladder within the pelvis. This contains multiple irregular fluid collections. The dominant collection within the posterior  pelvis measures 7 x 4.6 cm and exerts mass effect on the rectosigmoid colon. Moderate gas within the subcutaneous soft tissues of the anterior abdominal wall. Musculoskeletal: No acute or suspicious bone lesion. IMPRESSION: 1. Patient is status post hysterectomy. Ill-defined soft tissue density within the pelvis, posterior to the urinary bladder which may reflect combination of hematoma and postsurgical changes. Multiple irregular rim enhancing fluid collections in the region with a dominant 7 cm fluid collection in the posterior pelvis that is exerting mass effect on the rectosigmoid colon. Findings could be secondary to  organizing postoperative fluid collection or hematoma, with or without the presence of infection. 2. Negative for small bowel obstruction 3. Interval endovascular coiling of right inferior epigastric artery Electronically Signed: By: Donavan Foil M.D. On: 12/30/2017 18:55   Ct Image Guided Drainage By Percutaneous Catheter  Result Date: 01/03/2018 INDICATION: 52 year old female with a history of pelvic abscess/fluid after hysterectomy EXAM: CT-GUIDED DRAINAGE OF PELVIC ABSCESS MEDICATIONS: The patient is currently admitted to the hospital and receiving intravenous antibiotics. The antibiotics were administered within an appropriate time frame prior to the initiation of the procedure. ANESTHESIA/SEDATION: 2.0 mg IV Versed 100 mcg IV Fentanyl Moderate Sedation Time:  15 minutes The patient was continuously monitored during the procedure by the interventional radiology nurse under my direct supervision. COMPLICATIONS: None TECHNIQUE: Informed written consent was obtained from the patient after a thorough discussion of the procedural risks, benefits and alternatives. All questions were addressed. Maximal Sterile Barrier Technique was utilized including caps, mask, sterile gowns, sterile gloves, sterile drape, hand hygiene and skin antiseptic. A timeout was performed prior to the initiation of the procedure. PROCEDURE: The operative field was prepped with Chlorhexidine in a sterile fashion, and a sterile drape was applied covering the operative field. A sterile gown and sterile gloves were used for the procedure. Local anesthesia was provided with 1% Lidocaine. Patient position prone position on the CT gantry table. Scout CT was performed. The patient is then prepped and draped in the usual sterile fashion. 1% lidocaine was used for local anesthesia. Using CT guidance, trocar needle was used to target fluid in the low pelvis via left trans gluteal approach. Modified Seldinger technique was then used to place a 10  [place] drain. Thin serosanguineous fluid was aspirated. Catheter was sutured in position and a final CT image was recorded. Catheter was attached to bulb suction. Patient tolerated the procedure well and remained hemodynamically stable throughout. No complications were encountered and no significant blood loss. FINDINGS: Scout CT demonstrates fluid collection in the pelvis, unchanged in position and relatively unchanged in size. Final CT image demonstrates left trans gluteal drain centered within the fluid collection. IMPRESSION: Status post CT-guided drainage of pelvic abscess. Sample was sent to the lab for culture Signed, Dulcy Fanny. Earleen Newport, DO [facility] Radiology Electronically Signed   By: Corrie Mckusick D.O.   On: 01/03/2018 07:47     Assessment   52 y.o. [id number] s/p TLH BSO with postop pelvic abscess  Plan   1. S/p IR drain placed, with weaning output. 2. Continues to have needs for PCA, will continue - likely to d/c tomorrow and start PO.  Must be NPO tonight for possible IR drain replacement or removal. 3. Continue ABX for +cultures 4. Continue inpatient admission  ----- Larey Days, MD Attending Obstetrician and Gynecologist [facility], Department of [facility]  >60 minutes were again spent with this patient and >>50% of this time was  face-to-face.  The other time was spent in coordination of care, discussion with collaborators, reviewing results, and writing this note.

## 2018-01-10 DIAGNOSIS — F1120 Opioid dependence, uncomplicated: Secondary | ICD-10-CM | POA: Diagnosis not present

## 2018-01-10 DIAGNOSIS — R300 Dysuria: Secondary | ICD-10-CM | POA: Diagnosis not present

## 2018-01-14 ENCOUNTER — Other Ambulatory Visit: Payer: Self-pay | Admitting: Nurse Practitioner

## 2018-01-14 DIAGNOSIS — R110 Nausea: Secondary | ICD-10-CM

## 2018-01-14 DIAGNOSIS — T148XXA Other injury of unspecified body region, initial encounter: Secondary | ICD-10-CM

## 2018-01-26 DIAGNOSIS — F3181 Bipolar II disorder: Secondary | ICD-10-CM | POA: Diagnosis not present

## 2018-01-26 DIAGNOSIS — F4310 Post-traumatic stress disorder, unspecified: Secondary | ICD-10-CM | POA: Diagnosis not present

## 2018-02-02 ENCOUNTER — Ambulatory Visit: Payer: Self-pay

## 2018-06-16 ENCOUNTER — Other Ambulatory Visit: Payer: Self-pay | Admitting: Obstetrics & Gynecology

## 2018-06-16 DIAGNOSIS — Z1231 Encounter for screening mammogram for malignant neoplasm of breast: Secondary | ICD-10-CM

## 2018-06-17 ENCOUNTER — Encounter: Payer: Self-pay | Admitting: Radiology

## 2018-06-17 ENCOUNTER — Ambulatory Visit
Admission: RE | Admit: 2018-06-17 | Discharge: 2018-06-17 | Disposition: A | Discharge status: Home / Self Care | Payer: Medicare HMO | Source: Ambulatory Visit | Attending: Obstetrics & Gynecology | Admitting: Obstetrics & Gynecology

## 2018-06-17 DIAGNOSIS — Z1231 Encounter for screening mammogram for malignant neoplasm of breast: Secondary | ICD-10-CM | POA: Insufficient documentation

## 2018-06-17 HISTORY — DX: Malignant (primary) neoplasm, unspecified: C80.1

## 2018-06-20 ENCOUNTER — Other Ambulatory Visit: Payer: Self-pay | Admitting: Obstetrics and Gynecology

## 2018-06-20 ENCOUNTER — Other Ambulatory Visit: Payer: Self-pay | Admitting: Maternal Newborn

## 2018-06-20 ENCOUNTER — Other Ambulatory Visit: Payer: Self-pay | Admitting: Family Medicine

## 2018-06-20 ENCOUNTER — Other Ambulatory Visit: Payer: Self-pay | Admitting: Obstetrics & Gynecology

## 2018-06-22 DIAGNOSIS — M129 Arthropathy, unspecified: Secondary | ICD-10-CM | POA: Diagnosis not present

## 2018-06-22 DIAGNOSIS — E7800 Pure hypercholesterolemia, unspecified: Secondary | ICD-10-CM | POA: Diagnosis not present

## 2018-06-22 DIAGNOSIS — G8929 Other chronic pain: Secondary | ICD-10-CM | POA: Diagnosis not present

## 2018-06-22 DIAGNOSIS — M549 Dorsalgia, unspecified: Secondary | ICD-10-CM | POA: Diagnosis not present

## 2018-06-22 DIAGNOSIS — E039 Hypothyroidism, unspecified: Secondary | ICD-10-CM | POA: Diagnosis not present

## 2018-06-22 DIAGNOSIS — Z79899 Other long term (current) drug therapy: Secondary | ICD-10-CM | POA: Diagnosis not present

## 2018-06-22 DIAGNOSIS — Z131 Encounter for screening for diabetes mellitus: Secondary | ICD-10-CM | POA: Diagnosis not present

## 2018-06-22 DIAGNOSIS — E559 Vitamin D deficiency, unspecified: Secondary | ICD-10-CM | POA: Diagnosis not present

## 2018-06-22 DIAGNOSIS — Z0000 Encounter for general adult medical examination without abnormal findings: Secondary | ICD-10-CM | POA: Diagnosis not present

## 2018-06-22 DIAGNOSIS — M542 Cervicalgia: Secondary | ICD-10-CM | POA: Diagnosis not present

## 2018-07-04 DIAGNOSIS — M542 Cervicalgia: Secondary | ICD-10-CM | POA: Diagnosis not present

## 2018-07-04 DIAGNOSIS — Z79899 Other long term (current) drug therapy: Secondary | ICD-10-CM | POA: Diagnosis not present

## 2018-07-04 DIAGNOSIS — M549 Dorsalgia, unspecified: Secondary | ICD-10-CM | POA: Diagnosis not present

## 2018-07-04 DIAGNOSIS — F419 Anxiety disorder, unspecified: Secondary | ICD-10-CM | POA: Diagnosis not present

## 2018-07-04 DIAGNOSIS — G8929 Other chronic pain: Secondary | ICD-10-CM | POA: Diagnosis not present

## 2018-07-14 DIAGNOSIS — R0989 Other specified symptoms and signs involving the circulatory and respiratory systems: Secondary | ICD-10-CM | POA: Diagnosis not present

## 2018-07-14 DIAGNOSIS — M79605 Pain in left leg: Secondary | ICD-10-CM | POA: Diagnosis not present

## 2018-07-14 DIAGNOSIS — M79604 Pain in right leg: Secondary | ICD-10-CM | POA: Diagnosis not present

## 2018-07-14 DIAGNOSIS — Z1211 Encounter for screening for malignant neoplasm of colon: Secondary | ICD-10-CM | POA: Diagnosis not present

## 2018-08-05 DIAGNOSIS — G8929 Other chronic pain: Secondary | ICD-10-CM | POA: Diagnosis not present

## 2018-08-05 DIAGNOSIS — E039 Hypothyroidism, unspecified: Secondary | ICD-10-CM | POA: Diagnosis not present

## 2018-08-05 DIAGNOSIS — M549 Dorsalgia, unspecified: Secondary | ICD-10-CM | POA: Diagnosis not present

## 2018-08-05 DIAGNOSIS — Z79899 Other long term (current) drug therapy: Secondary | ICD-10-CM | POA: Diagnosis not present

## 2018-08-05 DIAGNOSIS — M542 Cervicalgia: Secondary | ICD-10-CM | POA: Diagnosis not present

## 2018-08-05 DIAGNOSIS — Z23 Encounter for immunization: Secondary | ICD-10-CM | POA: Diagnosis not present

## 2018-08-24 DIAGNOSIS — E559 Vitamin D deficiency, unspecified: Secondary | ICD-10-CM | POA: Diagnosis not present

## 2018-08-24 DIAGNOSIS — E7800 Pure hypercholesterolemia, unspecified: Secondary | ICD-10-CM | POA: Diagnosis not present

## 2018-08-24 DIAGNOSIS — M542 Cervicalgia: Secondary | ICD-10-CM | POA: Diagnosis not present

## 2018-08-24 DIAGNOSIS — E039 Hypothyroidism, unspecified: Secondary | ICD-10-CM | POA: Diagnosis not present

## 2018-09-01 DIAGNOSIS — Z1211 Encounter for screening for malignant neoplasm of colon: Secondary | ICD-10-CM | POA: Diagnosis not present

## 2018-09-01 DIAGNOSIS — Z01818 Encounter for other preprocedural examination: Secondary | ICD-10-CM | POA: Diagnosis not present

## 2018-09-05 DIAGNOSIS — Z79899 Other long term (current) drug therapy: Secondary | ICD-10-CM | POA: Diagnosis not present

## 2018-09-05 DIAGNOSIS — G8929 Other chronic pain: Secondary | ICD-10-CM | POA: Diagnosis not present

## 2018-09-05 DIAGNOSIS — M542 Cervicalgia: Secondary | ICD-10-CM | POA: Diagnosis not present

## 2018-09-05 DIAGNOSIS — M549 Dorsalgia, unspecified: Secondary | ICD-10-CM | POA: Diagnosis not present

## 2018-09-05 DIAGNOSIS — E7800 Pure hypercholesterolemia, unspecified: Secondary | ICD-10-CM | POA: Diagnosis not present

## 2018-10-03 DIAGNOSIS — G8929 Other chronic pain: Secondary | ICD-10-CM | POA: Diagnosis not present

## 2018-10-03 DIAGNOSIS — Z79899 Other long term (current) drug therapy: Secondary | ICD-10-CM | POA: Diagnosis not present

## 2018-10-03 DIAGNOSIS — E559 Vitamin D deficiency, unspecified: Secondary | ICD-10-CM | POA: Diagnosis not present

## 2018-10-03 DIAGNOSIS — E7800 Pure hypercholesterolemia, unspecified: Secondary | ICD-10-CM | POA: Diagnosis not present

## 2018-10-03 DIAGNOSIS — M549 Dorsalgia, unspecified: Secondary | ICD-10-CM | POA: Diagnosis not present

## 2018-10-03 DIAGNOSIS — M542 Cervicalgia: Secondary | ICD-10-CM | POA: Diagnosis not present

## 2018-10-03 DIAGNOSIS — E039 Hypothyroidism, unspecified: Secondary | ICD-10-CM | POA: Diagnosis not present

## 2018-10-07 DIAGNOSIS — F419 Anxiety disorder, unspecified: Secondary | ICD-10-CM | POA: Diagnosis not present

## 2018-10-07 DIAGNOSIS — F329 Major depressive disorder, single episode, unspecified: Secondary | ICD-10-CM | POA: Diagnosis not present

## 2018-10-07 DIAGNOSIS — M549 Dorsalgia, unspecified: Secondary | ICD-10-CM | POA: Diagnosis not present

## 2018-10-07 DIAGNOSIS — M542 Cervicalgia: Secondary | ICD-10-CM | POA: Diagnosis not present

## 2018-10-12 DIAGNOSIS — N951 Menopausal and female climacteric states: Secondary | ICD-10-CM | POA: Diagnosis not present

## 2018-11-01 DIAGNOSIS — G8929 Other chronic pain: Secondary | ICD-10-CM | POA: Diagnosis not present

## 2018-11-01 DIAGNOSIS — Z79899 Other long term (current) drug therapy: Secondary | ICD-10-CM | POA: Diagnosis not present

## 2018-11-01 DIAGNOSIS — M549 Dorsalgia, unspecified: Secondary | ICD-10-CM | POA: Diagnosis not present

## 2018-11-01 DIAGNOSIS — E039 Hypothyroidism, unspecified: Secondary | ICD-10-CM | POA: Diagnosis not present

## 2018-11-01 DIAGNOSIS — M542 Cervicalgia: Secondary | ICD-10-CM | POA: Diagnosis not present

## 2018-12-01 DIAGNOSIS — Z79899 Other long term (current) drug therapy: Secondary | ICD-10-CM | POA: Diagnosis not present

## 2018-12-01 DIAGNOSIS — M549 Dorsalgia, unspecified: Secondary | ICD-10-CM | POA: Diagnosis not present

## 2018-12-01 DIAGNOSIS — M542 Cervicalgia: Secondary | ICD-10-CM | POA: Diagnosis not present

## 2018-12-01 DIAGNOSIS — G8929 Other chronic pain: Secondary | ICD-10-CM | POA: Diagnosis not present

## 2018-12-29 DIAGNOSIS — M542 Cervicalgia: Secondary | ICD-10-CM | POA: Diagnosis not present

## 2018-12-29 DIAGNOSIS — M549 Dorsalgia, unspecified: Secondary | ICD-10-CM | POA: Diagnosis not present

## 2018-12-29 DIAGNOSIS — Z79899 Other long term (current) drug therapy: Secondary | ICD-10-CM | POA: Diagnosis not present

## 2018-12-29 DIAGNOSIS — F419 Anxiety disorder, unspecified: Secondary | ICD-10-CM | POA: Diagnosis not present

## 2018-12-29 DIAGNOSIS — G8929 Other chronic pain: Secondary | ICD-10-CM | POA: Diagnosis not present

## 2019-01-10 DIAGNOSIS — Z20828 Contact with and (suspected) exposure to other viral communicable diseases: Secondary | ICD-10-CM | POA: Diagnosis not present

## 2019-01-10 DIAGNOSIS — J069 Acute upper respiratory infection, unspecified: Secondary | ICD-10-CM | POA: Diagnosis not present

## 2019-01-26 DIAGNOSIS — M542 Cervicalgia: Secondary | ICD-10-CM | POA: Diagnosis not present

## 2019-01-26 DIAGNOSIS — M549 Dorsalgia, unspecified: Secondary | ICD-10-CM | POA: Diagnosis not present

## 2019-01-26 DIAGNOSIS — G8929 Other chronic pain: Secondary | ICD-10-CM | POA: Diagnosis not present

## 2019-01-26 DIAGNOSIS — F419 Anxiety disorder, unspecified: Secondary | ICD-10-CM | POA: Diagnosis not present

## 2019-02-22 DIAGNOSIS — F419 Anxiety disorder, unspecified: Secondary | ICD-10-CM | POA: Diagnosis not present

## 2019-02-22 DIAGNOSIS — E039 Hypothyroidism, unspecified: Secondary | ICD-10-CM | POA: Diagnosis not present

## 2019-02-22 DIAGNOSIS — M542 Cervicalgia: Secondary | ICD-10-CM | POA: Diagnosis not present

## 2019-02-22 DIAGNOSIS — M549 Dorsalgia, unspecified: Secondary | ICD-10-CM | POA: Diagnosis not present

## 2019-02-22 DIAGNOSIS — Z79899 Other long term (current) drug therapy: Secondary | ICD-10-CM | POA: Diagnosis not present

## 2019-02-22 DIAGNOSIS — G8929 Other chronic pain: Secondary | ICD-10-CM | POA: Diagnosis not present

## 2019-02-22 DIAGNOSIS — E7800 Pure hypercholesterolemia, unspecified: Secondary | ICD-10-CM | POA: Diagnosis not present

## 2019-03-05 DIAGNOSIS — L237 Allergic contact dermatitis due to plants, except food: Secondary | ICD-10-CM | POA: Diagnosis not present

## 2019-03-23 DIAGNOSIS — M549 Dorsalgia, unspecified: Secondary | ICD-10-CM | POA: Diagnosis not present

## 2019-03-23 DIAGNOSIS — M542 Cervicalgia: Secondary | ICD-10-CM | POA: Diagnosis not present

## 2019-03-23 DIAGNOSIS — G8929 Other chronic pain: Secondary | ICD-10-CM | POA: Diagnosis not present

## 2019-03-23 DIAGNOSIS — E7800 Pure hypercholesterolemia, unspecified: Secondary | ICD-10-CM | POA: Diagnosis not present

## 2019-03-27 DIAGNOSIS — S0180XA Unspecified open wound of other part of head, initial encounter: Secondary | ICD-10-CM | POA: Diagnosis not present

## 2019-03-27 DIAGNOSIS — L089 Local infection of the skin and subcutaneous tissue, unspecified: Secondary | ICD-10-CM | POA: Diagnosis not present

## 2019-03-27 DIAGNOSIS — Z202 Contact with and (suspected) exposure to infections with a predominantly sexual mode of transmission: Secondary | ICD-10-CM | POA: Diagnosis not present

## 2019-04-14 DIAGNOSIS — L089 Local infection of the skin and subcutaneous tissue, unspecified: Secondary | ICD-10-CM | POA: Diagnosis not present

## 2019-04-24 DIAGNOSIS — F909 Attention-deficit hyperactivity disorder, unspecified type: Secondary | ICD-10-CM | POA: Diagnosis not present

## 2019-04-24 DIAGNOSIS — Z79899 Other long term (current) drug therapy: Secondary | ICD-10-CM | POA: Diagnosis not present

## 2019-04-24 DIAGNOSIS — G8929 Other chronic pain: Secondary | ICD-10-CM | POA: Diagnosis not present

## 2019-04-24 DIAGNOSIS — M549 Dorsalgia, unspecified: Secondary | ICD-10-CM | POA: Diagnosis not present

## 2019-04-24 DIAGNOSIS — M542 Cervicalgia: Secondary | ICD-10-CM | POA: Diagnosis not present

## 2019-05-10 DIAGNOSIS — B349 Viral infection, unspecified: Secondary | ICD-10-CM | POA: Diagnosis not present

## 2019-05-10 DIAGNOSIS — Z20828 Contact with and (suspected) exposure to other viral communicable diseases: Secondary | ICD-10-CM | POA: Diagnosis not present

## 2019-05-23 DIAGNOSIS — G8929 Other chronic pain: Secondary | ICD-10-CM | POA: Diagnosis not present

## 2019-05-23 DIAGNOSIS — M549 Dorsalgia, unspecified: Secondary | ICD-10-CM | POA: Diagnosis not present

## 2019-05-23 DIAGNOSIS — E039 Hypothyroidism, unspecified: Secondary | ICD-10-CM | POA: Diagnosis not present

## 2019-05-23 DIAGNOSIS — M542 Cervicalgia: Secondary | ICD-10-CM | POA: Diagnosis not present

## 2019-05-25 DIAGNOSIS — L089 Local infection of the skin and subcutaneous tissue, unspecified: Secondary | ICD-10-CM | POA: Diagnosis not present

## 2019-05-25 DIAGNOSIS — Z8614 Personal history of Methicillin resistant Staphylococcus aureus infection: Secondary | ICD-10-CM | POA: Diagnosis not present

## 2019-05-29 ENCOUNTER — Other Ambulatory Visit: Payer: Self-pay

## 2019-05-29 ENCOUNTER — Encounter: Payer: Self-pay | Admitting: Emergency Medicine

## 2019-05-29 ENCOUNTER — Emergency Department
Admission: EM | Admit: 2019-05-29 | Discharge: 2019-05-29 | Disposition: A | Discharge status: Home / Self Care | Payer: Medicare HMO | Attending: Emergency Medicine | Admitting: Emergency Medicine

## 2019-05-29 DIAGNOSIS — R21 Rash and other nonspecific skin eruption: Secondary | ICD-10-CM | POA: Diagnosis not present

## 2019-05-29 DIAGNOSIS — K121 Other forms of stomatitis: Secondary | ICD-10-CM | POA: Diagnosis not present

## 2019-05-29 DIAGNOSIS — E039 Hypothyroidism, unspecified: Secondary | ICD-10-CM | POA: Insufficient documentation

## 2019-05-29 DIAGNOSIS — Z886 Allergy status to analgesic agent status: Secondary | ICD-10-CM | POA: Insufficient documentation

## 2019-05-29 DIAGNOSIS — Z79899 Other long term (current) drug therapy: Secondary | ICD-10-CM | POA: Insufficient documentation

## 2019-05-29 DIAGNOSIS — G40909 Epilepsy, unspecified, not intractable, without status epilepticus: Secondary | ICD-10-CM | POA: Diagnosis not present

## 2019-05-29 MED ORDER — HYDROCORTISONE 2.5 % EX OINT: TOPICAL_OINTMENT | Freq: Two times a day (BID) | CUTANEOUS | 0 refills | Status: DC

## 2019-05-29 MED ORDER — MAGIC MOUTHWASH W/LIDOCAINE: 10.0000 mL | Freq: Four times a day (QID) | ORAL | 0 refills | Status: DC | PRN

## 2019-05-29 NOTE — ED Notes (Signed)
See triage note  Presents with possible infection  States she thinks she has a parasite infection   Hx of MRSA in past     She now has a rash to legs,arms and lips

## 2019-05-29 NOTE — ED Triage Notes (Signed)
States had a MRSA wound to fore head at around Mothers day.  Seen by PCP and treated with Antibiotics and then had another episode after initial time.  Also c/o rash to body and face.  C/o itching all over.  Also c/o rash around mouth and states wounds expressing white type drainage the "moves around" -- patient believes she has parasites.  Red rash seen to arms, back and feet and mouth.  Seen through [facility], was told to follow up with Dermatology, not able to get an appointment for two months.

## 2019-05-29 NOTE — Discharge Instructions (Signed)
Keep your follow up with the dermatologist.  Follow up with your primary care provider for symptoms that are not improving over the next week.

## 2019-05-29 NOTE — ED Provider Notes (Signed)
[facility] Emergency Department Provider Note  ____________________________________________  Time seen: Approximately 5:53 PM  I have reviewed the triage vital signs and the nursing notes.   HISTORY  Chief Complaint No chief complaint on file.   HPI [name] is a 53 y.o. female who presents to the emergency department for treatment and evaluation of rash and "parasites."   Rash is been present intermittent for the past 3 months.  She states that her primary care provider has treated her on 3 separate occasions with antibiotics after culture showed she had MRSA.  She is currently on antibiotics.  She states that she believes that she has parasites living in her lips.  She sees small white bumps and when she squeezes them, white wormlike living things come out.   Past Medical History:  Diagnosis Date  . Abnormal uterine bleeding   . ADHD (attention deficit hyperactivity disorder)   . Allergy   . Anxiety   . Bipolar 1 disorder ([place])   . Bipolar disorder ([place])   . Cancer [facility])    uterine cancer  . DDD (degenerative disc disease)   . Depression   . HSV infection   . Hx: UTI (urinary tract infection)   . Pertussis   . Seizures ([place])    last seizure 12/2014   . Thyroid disease     Patient Active Problem List   Diagnosis Date Noted  . Protein-calorie malnutrition, severe 12/31/2017  . Abdominal pain 12/30/2017  . Acute blood loss anemia 12/17/2017  . S/P laparoscopic hysterectomy 12/15/2017  . 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 Hypotension 09/12/2016  . Right facial numbness 09/11/2016  . Stroke-like symptom 09/11/2016  . Seizure ([place]) 09/11/2016  . Hypothyroidism 09/11/2016  . Stroke-like symptoms 09/11/2016  . Fall   . Syncope 11/20/2015  . Scalp laceration 11/20/2015  . 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 DDD (degenerative disc disease)   . ACUTE BRONCHITIS  10/22/2009  . GERD 08/02/2009  . DEGENERATIVE DISC DISEASE 08/02/2009  . COUGH 08/02/2009  . Bipolar disorder ([place]) 08/01/2009  . Alcoholism ([place]) 08/01/2009  . BULIMIA 08/01/2009  . HEARING LOSS, BILATERAL 08/01/2009  . OSTEOPENIA 08/01/2009  . SEIZURE DISORDER 08/01/2009    Past Surgical History:  Procedure Laterality Date  . CERVICAL SPINE SURGERY     x 3 2001,2005,2007 c5-6  . CESAREAN SECTION  2003  . EMBOLIZATION N/A 12/16/2017   Procedure: EMBOLIZATION;  Surgeon: Algernon Huxley, MD;  Location: [facility];  Service: Cardiovascular;  Laterality: N/A;  . HYSTEROSCOPY N/A 10/11/2017   Procedure: HYSTEROSCOPY;  Surgeon: Harlin Heys, MD;  Location: [facility];  Service: Gynecology;  Laterality: N/A;  . HYSTEROSCOPY W/D&C N/A 10/11/2017   Procedure: DILATATION AND CURETTAGE /HYSTEROSCOPY;  Surgeon: Harlin Heys, MD;  Location: [facility];  Service: Gynecology;  Laterality: N/A;  . LAPAROSCOPIC BILATERAL SALPINGO OOPHERECTOMY Bilateral 12/15/2017   Procedure: LAPAROSCOPIC BILATERAL SALPINGO OOPHORECTOMY;  Surgeon: Mellody Drown, MD;  Location: [facility];  Service: Gynecology;  Laterality: Bilateral;  . LAPAROSCOPIC HYSTERECTOMY Bilateral 12/15/2017   Procedure: HYSTERECTOMY TOTAL LAPAROSCOPIC;  Surgeon: Mellody Drown, MD;  Location: [facility];  Service: Gynecology;  Laterality: Bilateral;  . SENTINEL NODE BIOPSY N/A 12/15/2017   Procedure: SENTINEL NODE [id number],JJHERDE;  Surgeon: Mellody Drown, MD;  Location: [facility];  Service: Gynecology;  Laterality: N/A;  . TONSILLECTOMY    . TUBAL LIGATION      Prior to Admission medications  Medication Sig Start Date End Date Taking? Authorizing Provider  5-Hydroxytryptophan (5-HTP) 100 MG CAPS Take 100 mg by mouth daily.     [provider]  acetaminophen (TYLENOL) 500 MG tablet Take 500-1,000 mg by mouth every 6 (six) hours as needed for mild pain or headache.     [provider]  acetaminophen  (TYLENOL) 500 MG tablet Take 2 tablets (1,000 mg total) by mouth every 6 (six) hours. 01/05/18   Ward, [name], MD  acyclovir (ZOVIRAX) 400 MG tablet Take 400 mg by mouth twice daily 08/09/17   [provider]  amphetamine-dextroamphetamine (ADDERALL XR) 20 MG 24 hr capsule Take 40 mg by mouth daily.    [provider]  Artificial Tear Ointment (DRY EYES OP) Place 1 drop into both eyes daily as needed (for dry eyes).    [provider]  Ascorbic Acid (VITAMIN C) 1000 MG tablet Take 1,000 mg by mouth daily.    [provider]  aspirin EC 81 MG EC tablet Take 1 tablet (81 mg total) by mouth daily. 09/14/16   Rosita Fire, MD  Aspirin-Salicylamide-Caffeine [facility] HEADACHE POWDER PO) Take 1 packet by mouth daily as needed (for pain or headache).     [provider]  cholecalciferol (VITAMIN D) 1000 units tablet Take 1,000 Units by mouth daily.    [provider]  Cranberry 1000 MG CAPS Take 1,000 mg by mouth daily.     [provider]  cyclobenzaprine (FLEXERIL) 5 MG tablet Take 5 mg by mouth 3 (three) times daily as needed for muscle spasms.  09/09/16   [provider]  DULoxetine (CYMBALTA) 20 MG capsule Take 40 mg by mouth daily.    [provider]  EPINEPHrine (EPIPEN 2-PAK) 0.3 mg/0.3 mL IJ SOAJ injection Inject 0.3 mg into the muscle once as needed (severe allergic reaction).    [provider]  gabapentin (NEURONTIN) 300 MG capsule Take 1 capsule (300 mg total) by mouth 3 (three) times daily. 300mg  morning, afternoon and 900mg  before bed 12/17/17   Ward, [name], MD  hydrocortisone 2.5 % ointment Apply topically 2 (two) times daily. 05/29/19   Ameila Weldon, Johnette Abraham B, FNP  lamoTRIgine (LAMICTAL) 100 MG tablet Take 100 mg by mouth every morning.  08/02/15   [provider]  linaclotide Rolan Lipa) 145 MCG CAPS capsule Take 1 capsule (145 mcg total) by mouth daily before breakfast. 12/20/17   Verlon Au, NP  magic mouthwash w/lidocaine SOLN Take 10 mLs by mouth 4 (four) times daily as needed for mouth pain. 05/29/19   Aizley Stenseth, Johnette Abraham B, FNP  Magnesium 200 MG TABS Take 200 mg by mouth every morning.     [provider]  milk thistle 175 MG tablet Take 175 mg by mouth 2 (two) times daily.     [provider]  ondansetron (ZOFRAN) 4 MG tablet Take 1 tablet (4 mg total) by mouth every 8 (eight) hours as needed for nausea or vomiting. 12/24/17   Verlon Au, NP  oxyCODONE 10 MG TABS Take 1 tablet (10 mg total) by mouth every 4 (four) hours as needed for moderate pain. 01/05/18   Ward, [name], MD  Oxycodone HCl 10 MG TABS Take 10 mg by mouth 4 (four) times daily as needed.    [provider]  polyethylene glycol (MIRALAX / GLYCOLAX) packet Take 17 g by mouth daily. 01/05/18   Ward, [name], MD  traZODone (DESYREL) 100 MG tablet Take 1  tablet (100 mg total) by mouth at bedtime as needed for sleep. 11/22/15   Reyne Dumas, MD    Allergies Naproxen  Family History  Problem Relation Age of Onset  . Cervical cancer Mother   . Cervical cancer Maternal Grandmother   . Polycythemia Maternal Grandmother   . Stomach cancer Father   . Colon cancer Paternal Grandmother   . Cervical cancer Cousin   . Cervical cancer Cousin   . Diabetes Mellitus II Neg Hx   . CAD Neg Hx   . Colon polyps Neg Hx   . Heart disease Neg Hx   . Rectal cancer Neg Hx     Social History Social History   Tobacco Use  . Smoking status: Never Smoker  . Smokeless tobacco: Never Used  Substance Use Topics  . Alcohol use: Yes    Alcohol/week: 3.0 standard drinks    Types: 3 Cans of beer per week    Comment: 1-3 drinks weeky,  since surgery 2/19 no drinking  . Drug use: No    Types: Cocaine    Comment: last used in 1990's    Review of Systems  Constitutional: Negative for fever. Respiratory: Negative for cough or shortness of breath.  Musculoskeletal: Negative for myalgias Skin:  Positive for rash.  Positive for white lesions on lips Neurological: Negative for numbness or paresthesias. ____________________________________________   PHYSICAL EXAM:  VITAL SIGNS: ED Triage Vitals  Enc Vitals Group     BP 05/29/19 1548 122/81     Pulse Rate 05/29/19 1548 95     Resp 05/29/19 1548 18     Temp 05/29/19 1548 98.2 F (36.8 C)     Temp Source 05/29/19 1548 Oral     SpO2 05/29/19 1548 99 %     Weight 05/29/19 1549 115 lb (52.2 kg)     Height 05/29/19 1549 5\' 1"  (1.549 m)     Head Circumference --      Peak Flow --      Pain Score 05/29/19 1557 0     Pain Loc --      Pain Edu? --      Excl. in [place]? --      Constitutional: Overall well appearing. Eyes: Conjunctivae are clear without discharge or drainage. Nose: No rhinorrhea noted. Mouth/Throat: Airway is patent.  Neck: No stridor. Unrestricted range of motion observed. Cardiovascular: Capillary refill is <3 seconds.  Respiratory: Respirations are even and unlabored.. Musculoskeletal: Unrestricted range of motion observed. Neurologic: Awake, alert, and oriented x 4.  Skin: White areas on lips that patient sees as parasites is actually skin peeling from the lip.  Areas of maculopapular rash on elbows and back appear as excoriated areas.  No secondary infection is noted.  There are some scabbed lesions on the forehead without any surrounding erythema or concern of infection.  ____________________________________________   LABS (all labs ordered are listed, but only abnormal results are displayed)  Labs Reviewed - No data to display ____________________________________________  EKG  Not indicated. ____________________________________________  RADIOLOGY  Not indicated ____________________________________________   PROCEDURES  Procedures ____________________________________________   INITIAL IMPRESSION / ASSESSMENT AND PLAN / ED COURSE  [name] is a 53 y.o. female presenting to the  emergency department for treatment and evaluation as described in the HPI.  I was unable to identify what the patient describes as a warm like parasites in her lips that "popped out" when she squeezes the skin of her lips.  I do see this skin  peeling from the lips.  She requested that I walk with her to the bathroom to look into the mirror so that she can actually squeeze 1 of these parasites out in front of me.  After watching her for a few moments, it is in fact skin that she is seeing.  Patient is somewhat upset that I do not agree with her that this is a "parasite."   She will be treated with the Magic mouthwash as she does have what appears to be stomatitis.  For the remainder of the rash, she was given some 2.5% ointment of hydrocortisone cream.  She was advised that she will need to follow-up with her primary care provider for any additional treatment.  Medications - No data to display   Pertinent labs & imaging results that were available during my care of the patient were reviewed by me and considered in my medical decision making (see chart for details).  ____________________________________________   FINAL CLINICAL IMPRESSION(S) / ED DIAGNOSES  Final diagnoses:  Stomatitis  Rash and nonspecific skin eruption    ED Discharge Orders         Ordered    magic mouthwash w/lidocaine SOLN  4 times daily PRN    Note to Pharmacy: Diphenhydramine HCL, Alum & Mag Hydroxide-Simeth, Nystatin, Lidocaine HCL  Mix 1:1:1 with 20 ml of lidocaine   05/29/19 1756    hydrocortisone 2.5 % ointment  2 times daily     05/29/19 1756           Note:  This document was prepared using Dragon voice recognition software and may include unintentional dictation errors.   Victorino Dike, FNP 06/02/19 0017    Schuyler Amor, MD 06/02/19 [phone]

## 2019-06-08 DIAGNOSIS — M129 Arthropathy, unspecified: Secondary | ICD-10-CM | POA: Diagnosis not present

## 2019-06-08 DIAGNOSIS — Z79899 Other long term (current) drug therapy: Secondary | ICD-10-CM | POA: Diagnosis not present

## 2019-06-08 DIAGNOSIS — Z1159 Encounter for screening for other viral diseases: Secondary | ICD-10-CM | POA: Diagnosis not present

## 2019-06-08 DIAGNOSIS — Z131 Encounter for screening for diabetes mellitus: Secondary | ICD-10-CM | POA: Diagnosis not present

## 2019-06-08 DIAGNOSIS — R002 Palpitations: Secondary | ICD-10-CM | POA: Diagnosis not present

## 2019-06-08 DIAGNOSIS — E7800 Pure hypercholesterolemia, unspecified: Secondary | ICD-10-CM | POA: Diagnosis not present

## 2019-06-08 DIAGNOSIS — E559 Vitamin D deficiency, unspecified: Secondary | ICD-10-CM | POA: Diagnosis not present

## 2019-06-08 DIAGNOSIS — E039 Hypothyroidism, unspecified: Secondary | ICD-10-CM | POA: Diagnosis not present

## 2019-06-22 DIAGNOSIS — E7800 Pure hypercholesterolemia, unspecified: Secondary | ICD-10-CM | POA: Diagnosis not present

## 2019-06-22 DIAGNOSIS — M549 Dorsalgia, unspecified: Secondary | ICD-10-CM | POA: Diagnosis not present

## 2019-06-22 DIAGNOSIS — M542 Cervicalgia: Secondary | ICD-10-CM | POA: Diagnosis not present

## 2019-06-22 DIAGNOSIS — G8929 Other chronic pain: Secondary | ICD-10-CM | POA: Diagnosis not present

## 2019-06-22 DIAGNOSIS — F909 Attention-deficit hyperactivity disorder, unspecified type: Secondary | ICD-10-CM | POA: Diagnosis not present

## 2019-06-22 DIAGNOSIS — E559 Vitamin D deficiency, unspecified: Secondary | ICD-10-CM | POA: Diagnosis not present

## 2019-06-22 DIAGNOSIS — G43909 Migraine, unspecified, not intractable, without status migrainosus: Secondary | ICD-10-CM | POA: Diagnosis not present

## 2019-06-22 DIAGNOSIS — E039 Hypothyroidism, unspecified: Secondary | ICD-10-CM | POA: Diagnosis not present

## 2019-06-28 ENCOUNTER — Emergency Department
Admission: EM | Admit: 2019-06-28 | Discharge: 2019-06-28 | Disposition: A | Discharge status: Home / Self Care | Payer: Medicare HMO | Attending: Emergency Medicine | Admitting: Emergency Medicine

## 2019-06-28 ENCOUNTER — Other Ambulatory Visit: Payer: Self-pay

## 2019-06-28 DIAGNOSIS — F40218 Other animal type phobia: Secondary | ICD-10-CM

## 2019-06-28 DIAGNOSIS — Z886 Allergy status to analgesic agent status: Secondary | ICD-10-CM | POA: Insufficient documentation

## 2019-06-28 DIAGNOSIS — E039 Hypothyroidism, unspecified: Secondary | ICD-10-CM | POA: Insufficient documentation

## 2019-06-28 DIAGNOSIS — Z79899 Other long term (current) drug therapy: Secondary | ICD-10-CM | POA: Insufficient documentation

## 2019-06-28 DIAGNOSIS — Z8541 Personal history of malignant neoplasm of cervix uteri: Secondary | ICD-10-CM | POA: Insufficient documentation

## 2019-06-28 DIAGNOSIS — L988 Other specified disorders of the skin and subcutaneous tissue: Secondary | ICD-10-CM | POA: Diagnosis not present

## 2019-06-28 DIAGNOSIS — L989 Disorder of the skin and subcutaneous tissue, unspecified: Secondary | ICD-10-CM | POA: Diagnosis present

## 2019-06-28 NOTE — ED Notes (Addendum)
Pt is laying calmly on bed. Pt refused sandwich tray, and received a cup of water. Pt says she can not eat because her mouth is very sore. Pt was given the phone to make a phone call.

## 2019-06-28 NOTE — Discharge Instructions (Addendum)
Please seek medical attention for any high fevers, chest pain, shortness of breath, change in behavior, persistent vomiting, bloody stool or any other new or concerning symptoms.  

## 2019-06-28 NOTE — ED Triage Notes (Signed)
Pt to the er for parasitic infection. Pt has been seen multiple times. Pt has bumps over her face and around her mouth. Pt is very anxious and is hard to redirect. Pt is tearful in triage.

## 2019-06-28 NOTE — ED Notes (Addendum)
Pt dressed out with [name],NT. Pt purse placed in belongings. Pt has tank top, bra, leggings, and flip flops in personal bag. Placed at nurses station. MD says no blood draw at this time. Pt calm, cooperative, AOx4 at this time.

## 2019-06-28 NOTE — ED Provider Notes (Signed)
[facility] Emergency Department Provider Note   ____________________________________________   I have reviewed the triage vital signs and the nursing notes.   HISTORY  Chief Complaint Wound Infection   History limited by: Not Limited   HPI [name] is a 53 y.o. female who presents to the emergency department today with concern for parasite infection. The patient states that she has symptoms that have gone for months. She says that she has noticed worm like objects in her stool. Describes them as clay colored. She has also noticed multiple skin lesions on her face and around her mouth which she is concerned is also a result of a parasite infection. States the lesions around her mouth are painful. The patient says she went to urgent care last night and was prescribed a medication for parasites but could not afford it.   Records reviewed. Per medical record review patient has a history of seizures.   Past Medical History:  Diagnosis Date  . Abnormal uterine bleeding   . ADHD (attention deficit hyperactivity disorder)   . Allergy   . Anxiety   . Bipolar 1 disorder ([place])   . Bipolar disorder ([place])   . Cancer [facility])    uterine cancer  . DDD (degenerative disc disease)   . Depression   . HSV infection   . Hx: UTI (urinary tract infection)   . Pertussis   . Seizures ([place])    last seizure 12/2014   . Thyroid disease     Patient Active Problem List   Diagnosis Date Noted  . Protein-calorie malnutrition, severe 12/31/2017  . Abdominal pain 12/30/2017  . Acute blood loss anemia 12/17/2017  . S/P laparoscopic hysterectomy 12/15/2017  . 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 Hypotension 09/12/2016  . Right facial numbness 09/11/2016  . Stroke-like symptom 09/11/2016  . Seizure ([place]) 09/11/2016  . Hypothyroidism 09/11/2016  . Stroke-like symptoms 09/11/2016  . Fall   . Syncope 11/20/2015  . Scalp laceration 11/20/2015  . Major  depressive disorder, recurrent severe without psychotic features ([place]) 03/23/2015  . DDD (degenerative disc disease)   . ACUTE BRONCHITIS 10/22/2009  . GERD 08/02/2009  . DEGENERATIVE DISC DISEASE 08/02/2009  . COUGH 08/02/2009  . Bipolar disorder ([place]) 08/01/2009  . Alcoholism ([place]) 08/01/2009  . BULIMIA 08/01/2009  . HEARING LOSS, BILATERAL 08/01/2009  . OSTEOPENIA 08/01/2009  . SEIZURE DISORDER 08/01/2009    Past Surgical History:  Procedure Laterality Date  . CERVICAL SPINE SURGERY     x 3 2001,2005,2007 c5-6  . CESAREAN SECTION  2003  . EMBOLIZATION N/A 12/16/2017   Procedure: EMBOLIZATION;  Surgeon: Algernon Huxley, MD;  Location: [place] CV LAB;  Service: Cardiovascular;  Laterality: N/A;  . HYSTEROSCOPY N/A 10/11/2017   Procedure: HYSTEROSCOPY;  Surgeon: Harlin Heys, MD;  Location: [facility] ORS;  Service: Gynecology;  Laterality: N/A;  . HYSTEROSCOPY W/D&C N/A 10/11/2017   Procedure: DILATATION AND CURETTAGE /HYSTEROSCOPY;  Surgeon: Harlin Heys, MD;  Location: [facility] ORS;  Service: Gynecology;  Laterality: N/A;  . LAPAROSCOPIC BILATERAL SALPINGO OOPHERECTOMY Bilateral 12/15/2017   Procedure: LAPAROSCOPIC BILATERAL SALPINGO OOPHORECTOMY;  Surgeon: Mellody Drown, MD;  Location: [facility] ORS;  Service: Gynecology;  Laterality: Bilateral;  . LAPAROSCOPIC HYSTERECTOMY Bilateral 12/15/2017   Procedure: HYSTERECTOMY TOTAL LAPAROSCOPIC;  Surgeon: Mellody Drown, MD;  Location: [facility] ORS;  Service: Gynecology;  Laterality: Bilateral;  . SENTINEL NODE BIOPSY N/A 12/15/2017   Procedure: SENTINEL NODE [id number];  Surgeon: Mellody Drown, MD;  Location: [facility] ORS;  Service: Gynecology;  Laterality: N/A;  . TONSILLECTOMY    . TUBAL LIGATION      Prior to Admission medications   Medication Sig Start Date End Date Taking? Authorizing Provider  5-Hydroxytryptophan (5-HTP) 100 MG CAPS Take 100 mg by mouth daily.     [provider]  acetaminophen (TYLENOL) 500 MG  tablet Take 500-1,000 mg by mouth every 6 (six) hours as needed for mild pain or headache.     [provider]  acetaminophen (TYLENOL) 500 MG tablet Take 2 tablets (1,000 mg total) by mouth every 6 (six) hours. 01/05/18   Ward, [name], MD  acyclovir (ZOVIRAX) 400 MG tablet Take 400 mg by mouth twice daily 08/09/17   [provider]  amphetamine-dextroamphetamine (ADDERALL XR) 20 MG 24 hr capsule Take 40 mg by mouth daily.    [provider]  Artificial Tear Ointment (DRY EYES OP) Place 1 drop into both eyes daily as needed (for dry eyes).    [provider]  Ascorbic Acid (VITAMIN C) 1000 MG tablet Take 1,000 mg by mouth daily.    [provider]  aspirin EC 81 MG EC tablet Take 1 tablet (81 mg total) by mouth daily. 09/14/16   Rosita Fire, MD  Aspirin-Salicylamide-Caffeine [facility] HEADACHE POWDER PO) Take 1 packet by mouth daily as needed (for pain or headache).     [provider]  cholecalciferol (VITAMIN D) 1000 units tablet Take 1,000 Units by mouth daily.    [provider]  Cranberry 1000 MG CAPS Take 1,000 mg by mouth daily.     [provider]  cyclobenzaprine (FLEXERIL) 5 MG tablet Take 5 mg by mouth 3 (three) times daily as needed for muscle spasms.  09/09/16   [provider]  DULoxetine (CYMBALTA) 20 MG capsule Take 40 mg by mouth daily.    [provider]  EPINEPHrine (EPIPEN 2-PAK) 0.3 mg/0.3 mL IJ SOAJ injection Inject 0.3 mg into the muscle once as needed (severe allergic reaction).    [provider]  gabapentin (NEURONTIN) 300 MG capsule Take 1 capsule (300 mg total) by mouth 3 (three) times daily. 300mg  morning, afternoon and 900mg  before bed 12/17/17   Ward, [name], MD  hydrocortisone 2.5 % ointment Apply topically 2 (two) times daily. 05/29/19   Triplett, Johnette Abraham B, FNP  lamoTRIgine (LAMICTAL) 100 MG tablet Take 100 mg by mouth every morning.  08/02/15   [provider]  linaclotide Rolan Lipa) 145 MCG CAPS capsule Take 1 capsule (145 mcg total) by mouth daily before breakfast. 12/20/17   Verlon Au, NP  magic mouthwash w/lidocaine SOLN Take 10 mLs by mouth 4 (four) times daily as needed for mouth pain. 05/29/19   Triplett, Johnette Abraham B, FNP  Magnesium 200 MG TABS Take 200 mg by mouth every morning.     [provider]  milk thistle 175 MG tablet Take 175 mg by mouth 2 (two) times daily.     [provider]  ondansetron (ZOFRAN) 4 MG tablet Take 1 tablet (4 mg total) by mouth every 8 (eight) hours as needed for nausea or vomiting. 12/24/17   Verlon Au, NP  oxyCODONE 10 MG TABS Take 1 tablet (10 mg total) by mouth every 4 (four) hours as needed for moderate pain. 01/05/18   Ward, [name], MD  Oxycodone HCl 10 MG TABS Take 10 mg by mouth 4 (four) times daily as needed.    [provider]  polyethylene glycol (  MIRALAX / GLYCOLAX) packet Take 17 g by mouth daily. 01/05/18   Ward, [name], MD  traZODone (DESYREL) 100 MG tablet Take 1 tablet (100 mg total) by mouth at bedtime as needed for sleep. 11/22/15   Reyne Dumas, MD    Allergies Naproxen  Family History  Problem Relation Age of Onset  . Cervical cancer Mother   . Cervical cancer Maternal Grandmother   . Polycythemia Maternal Grandmother   . Stomach cancer Father   . Colon cancer Paternal Grandmother   . Cervical cancer Cousin   . Cervical cancer Cousin   . Diabetes Mellitus II Neg Hx   . CAD Neg Hx   . Colon polyps Neg Hx   . Heart disease Neg Hx   . Rectal cancer Neg Hx     Social History Social History   Tobacco Use  . Smoking status: Never Smoker  . Smokeless tobacco: Never Used  Substance Use Topics  . Alcohol use: Yes    Alcohol/week: 3.0 standard drinks    Types: 3 Cans of beer per week    Comment: 1 beer today  . Drug use: No    Types: Cocaine    Comment: last used in 1990's    Review of Systems Constitutional: No fever/chills Eyes: No visual  changes. ENT: No sore throat. Cardiovascular: Denies chest pain. Respiratory: Denies shortness of breath. Gastrointestinal: No abdominal pain. Parasites in stool. Genitourinary: Negative for dysuria. Musculoskeletal: Negative for back pain. Skin: Concern for parasites.  Neurological: Negative for headaches, focal weakness or numbness.  ____________________________________________   PHYSICAL EXAM:  VITAL SIGNS: ED Triage Vitals  Enc Vitals Group     BP 06/28/19 1817 (!) 170/88     Pulse Rate 06/28/19 1817 (!) 103     Resp 06/28/19 1817 18     Temp --      Temp Source 06/28/19 1817 Oral     SpO2 06/28/19 1817 98 %     Weight 06/28/19 1818 115 lb (52.2 kg)     Height 06/28/19 1818 5\' 1"  (1.549 m)     Head Circumference --      Peak Flow --      Pain Score 06/28/19 1817 7   Constitutional: Alert and oriented.  Eyes: Conjunctivae are normal.  ENT      Head: Normocephalic and atraumatic.      Nose: No congestion/rhinnorhea.      Mouth/Throat: Mucous membranes are moist. Appears to have some cold sores around mouth.       Neck: No stridor. Hematological/Lymphatic/Immunilogical: No cervical lymphadenopathy. Cardiovascular: Normal rate, regular rhythm.  No murmurs, rubs, or gallops.  Respiratory: Normal respiratory effort without tachypnea nor retractions. Breath sounds are clear and equal bilaterally. No wheezes/rales/rhonchi. Gastrointestinal: Soft and non tender. No rebound. No guarding.  Genitourinary: Deferred Musculoskeletal: Normal range of motion in all extremities. No lower extremity edema. Neurologic:  Normal speech and language. No gross focal neurologic deficits are appreciated.  Skin:  Multiple lesions over skin. No secondary infection appreciated Psychiatric: Mood and affect are normal. Speech and behavior are normal. Patient exhibits appropriate insight and judgment.  ____________________________________________    LABS (pertinent  positives/negatives)  None  ____________________________________________   EKG  None  ____________________________________________    RADIOLOGY  None  ____________________________________________   PROCEDURES  Procedures  ____________________________________________   INITIAL IMPRESSION / ASSESSMENT AND PLAN / ED COURSE  Pertinent labs & imaging results that were available during my care of the patient were reviewed by  me and considered in my medical decision making (see chart for details).   Patient presented to the emergency department today because of concerns for parasite infections.  Patient did have multiple skin lesions however nothing was concerned about cell for parasite infection.  She was also concern for parasites in her stool.  Did offer to obtain a stool sample.  Prior to obtaining stool sample patient decided to leave.  She states she will follow-up with primary care.  ____________________________________________   FINAL CLINICAL IMPRESSION(S) / ED DIAGNOSES  Final diagnoses:  Fear of parasites     Note: This dictation was prepared with Dragon dictation. Any transcriptional errors that result from this process are unintentional     Nance Pear, MD 06/28/19 2352

## 2019-06-28 NOTE — ED Notes (Signed)
Pt reports seeing worms in her stool. MD informed pt that we will see if she can give [place] a stool sample. If not, pt understanding and willing to go home.

## 2019-06-28 NOTE — ED Notes (Signed)
Pt belongings returned to her and showed to outside. Pt given a stool cup and told to bring to PCP when able to produce a sample.

## 2019-09-27 ENCOUNTER — Other Ambulatory Visit: Payer: Self-pay | Admitting: General Surgery

## 2019-10-06 ENCOUNTER — Other Ambulatory Visit: Payer: Self-pay

## 2019-10-06 DIAGNOSIS — Z20822 Contact with and (suspected) exposure to covid-19: Secondary | ICD-10-CM

## 2019-10-07 LAB — NOVEL CORONAVIRUS, NAA: SARS-CoV-2, NAA: DETECTED — AB

## 2019-10-13 ENCOUNTER — Other Ambulatory Visit: Admission: RE | Admit: 2019-10-13 | Payer: Medicare HMO | Source: Ambulatory Visit

## 2019-10-18 ENCOUNTER — Ambulatory Visit: Admit: 2019-10-18 | Payer: Medicare HMO | Admitting: General Surgery

## 2019-10-30 ENCOUNTER — Other Ambulatory Visit: Payer: Medicare HMO

## 2019-11-01 ENCOUNTER — Ambulatory Visit
Admission: RE | Admit: 2019-11-01 | Discharge: 2019-11-01 | Disposition: A | Discharge status: Home / Self Care | Payer: Medicare HMO | Attending: General Surgery | Admitting: General Surgery

## 2019-11-01 ENCOUNTER — Ambulatory Visit: Payer: Medicare HMO | Admitting: Registered Nurse

## 2019-11-01 ENCOUNTER — Encounter: Payer: Self-pay | Admitting: General Surgery

## 2019-11-01 ENCOUNTER — Encounter
Admission: RE | Disposition: A | Discharge status: Home / Self Care | Payer: Self-pay | Source: Home / Self Care | Attending: General Surgery

## 2019-11-01 DIAGNOSIS — Z7982 Long term (current) use of aspirin: Secondary | ICD-10-CM | POA: Insufficient documentation

## 2019-11-01 DIAGNOSIS — K2100 Gastro-esophageal reflux disease with esophagitis, without bleeding: Secondary | ICD-10-CM | POA: Diagnosis not present

## 2019-11-01 DIAGNOSIS — Z79899 Other long term (current) drug therapy: Secondary | ICD-10-CM | POA: Diagnosis not present

## 2019-11-01 DIAGNOSIS — F909 Attention-deficit hyperactivity disorder, unspecified type: Secondary | ICD-10-CM | POA: Insufficient documentation

## 2019-11-01 DIAGNOSIS — F319 Bipolar disorder, unspecified: Secondary | ICD-10-CM | POA: Diagnosis not present

## 2019-11-01 DIAGNOSIS — Z8049 Family history of malignant neoplasm of other genital organs: Secondary | ICD-10-CM | POA: Insufficient documentation

## 2019-11-01 DIAGNOSIS — Z886 Allergy status to analgesic agent status: Secondary | ICD-10-CM | POA: Diagnosis not present

## 2019-11-01 DIAGNOSIS — Z90710 Acquired absence of both cervix and uterus: Secondary | ICD-10-CM | POA: Diagnosis not present

## 2019-11-01 DIAGNOSIS — R1310 Dysphagia, unspecified: Secondary | ICD-10-CM | POA: Insufficient documentation

## 2019-11-01 HISTORY — PX: ESOPHAGOGASTRODUODENOSCOPY (EGD) WITH PROPOFOL: SHX5813

## 2019-11-01 SURGERY — ESOPHAGOGASTRODUODENOSCOPY (EGD) WITH PROPOFOL
Anesthesia: General

## 2019-11-01 MED ORDER — PROPOFOL 500 MG/50ML IV EMUL
INTRAVENOUS | Status: DC | PRN
  Administered 2019-11-01: 150 ug/kg/min via INTRAVENOUS

## 2019-11-01 MED ORDER — GLYCOPYRROLATE 0.2 MG/ML IJ SOLN
INTRAMUSCULAR | Status: DC | PRN
  Administered 2019-11-01: .2 mg via INTRAVENOUS

## 2019-11-01 MED ORDER — GLYCOPYRROLATE 0.2 MG/ML IJ SOLN
INTRAMUSCULAR | Status: AC
  Filled 2019-11-01: qty 1

## 2019-11-01 MED ORDER — PROPOFOL 10 MG/ML IV BOLUS
INTRAVENOUS | Status: DC | PRN
  Administered 2019-11-01: 10 mg via INTRAVENOUS
  Administered 2019-11-01: 100 mg via INTRAVENOUS

## 2019-11-01 MED ORDER — SODIUM CHLORIDE 0.9 % IV SOLN: INTRAVENOUS | Status: DC

## 2019-11-01 MED ORDER — PROPOFOL 500 MG/50ML IV EMUL
INTRAVENOUS | Status: AC
  Filled 2019-11-01: qty 50

## 2019-11-01 MED ORDER — LIDOCAINE HCL (CARDIAC) PF 100 MG/5ML IV SOSY
PREFILLED_SYRINGE | INTRAVENOUS | Status: DC | PRN
  Administered 2019-11-01: 40 mg via INTRAVENOUS

## 2019-11-01 NOTE — Op Note (Signed)
[facility] Gastroenterology Patient Name: [name] Procedure Date: 11/01/2019 9:34 AM MRN: [id number] Account #: [id number] Date of Birth: 04/26/1966 Admit Type: Outpatient Age: 54 Room: [facility] ENDO ROOM 1 Gender: Female Note Status: Finalized Procedure:             Upper GI endoscopy Indications:           Epigastric abdominal pain, Dysphagia Providers:             Robert Bellow, MD Referring MD:          Sandi Mariscal (Referring MD) Medicines:             Monitored Anesthesia Care Complications:         No immediate complications. Procedure:             Pre-Anesthesia Assessment:                        - Prior to the procedure, a History and Physical was                         performed, and patient medications, allergies and                         sensitivities were reviewed. The patient's tolerance                         of previous anesthesia was reviewed.                        - The risks and benefits of the procedure and the                         sedation options and risks were discussed with the                         patient. All questions were answered and informed                         consent was obtained.                        After obtaining informed consent, the endoscope was                         passed under direct vision. Throughout the procedure,                         the patient's blood pressure, pulse, and oxygen                         saturations were monitored continuously. The Endoscope                         was introduced through the mouth, and advanced to the                         second part of duodenum. The upper GI endoscopy was  accomplished without difficulty. The patient tolerated                         the procedure well. Findings:      LA Grade A (one or more mucosal breaks less than 5 mm, not extending       between tops of 2 mucosal folds) esophagitis with no bleeding was found        37 to 38 cm from the incisors. Biopsies were taken with a cold forceps       for histology.      The stomach was normal.      The examined duodenum was normal. Impression:            - LA Grade A reflux esophagitis with no bleeding.                         Biopsied.                        - Normal stomach.                        - Normal examined duodenum. Recommendation:        - Telephone endoscopist for pathology results in 1                         week. Procedure Code(s):     --- Professional ---                        [id number], Esophagogastroduodenoscopy, flexible,                         transoral; with biopsy, single or multiple Diagnosis Code(s):     --- Professional ---                        K21.00                        R10.13, Epigastric pain                        R13.10, Dysphagia, unspecified CPT copyright 2019 American Medical Association. All rights reserved. The codes documented in this report are preliminary and upon coder review may  be revised to meet current compliance requirements. Robert Bellow, MD 11/01/2019 9:54:40 AM This report has been signed electronically. Number of Addenda: 0 Note Initiated On: 11/01/2019 9:34 AM Estimated Blood Loss:  Estimated blood loss: none.      [facility]

## 2019-11-01 NOTE — Transfer of Care (Signed)
Immediate Anesthesia Transfer of Care Note  Patient: [name]  Procedure(s) Performed: Procedure(s) with comments: ESOPHAGOGASTRODUODENOSCOPY (EGD) WITH PROPOFOL (N/A) - POSITIVE ON 09/29/19 and 10/06/19  Patient Location: PACU and Endoscopy Unit  Anesthesia Type:General  Level of Consciousness: sedated  Airway & Oxygen Therapy: Patient Spontanous Breathing and Patient connected to nasal cannula oxygen  Post-op Assessment: Report given to RN and Post -op Vital signs reviewed and stable  Post vital signs: Reviewed and stable  Last Vitals:  Vitals:   11/01/19 0827 11/01/19 0957  BP: (!) 143/78 139/89  Pulse: 94 (!) 102  Resp: 16 13  Temp: (!) 36 C (!) 36 C  SpO2: 123XX123 [id number]    Complications: No apparent anesthesia complications

## 2019-11-01 NOTE — H&P (Signed)
[name] [id number] [birth year]     HPI: 54 year old woman with episodic epigastric pain and occasional dysphagia.  Most troublesome with chicken and dry bread.  Rare episodes of vomiting.  No triggering foods.  Minimal heartburn symptoms.  Family history of gastric cancer in her father.  Medications Prior to Admission  Medication Sig Dispense Refill Last Dose  . acyclovir (ZOVIRAX) 400 MG tablet Take 400 mg by mouth twice daily  9 10/31/2019 at Unknown time  . amphetamine-dextroamphetamine (ADDERALL XR) 20 MG 24 hr capsule Take 40 mg by mouth daily.   10/31/2019 at Unknown time  . Artificial Tear Ointment (DRY EYES OP) Place 1 drop into both eyes daily as needed (for dry eyes).   Past Week at Unknown time  . cyclobenzaprine (FLEXERIL) 5 MG tablet Take 5 mg by mouth 3 (three) times daily as needed for muscle spasms.   2 10/31/2019 at Unknown time  . doxycycline (VIBRA-TABS) 100 MG tablet Take 100 mg by mouth 2 (two) times daily.   10/31/2019 at Unknown time  . lamoTRIgine (LAMICTAL) 100 MG tablet Take 100 mg by mouth every morning.    Past Month at Unknown time  . milk thistle 175 MG tablet Take 175 mg by mouth 2 (two) times daily.    Past Month at Unknown time  . ondansetron (ZOFRAN) 4 MG tablet Take 1 tablet (4 mg total) by mouth every 8 (eight) hours as needed for nausea or vomiting. 10 tablet 0 Past Month at Unknown time  . oxyCODONE 10 MG TABS Take 1 tablet (10 mg total) by mouth every 4 (four) hours as needed for moderate pain. 120 tablet 0 10/31/2019 at Unknown time  . Oxycodone HCl 10 MG TABS Take 10 mg by mouth 4 (four) times daily as needed.   10/31/2019 at Unknown time  . polyethylene glycol (MIRALAX / GLYCOLAX) packet Take 17 g by mouth daily. 14 each 0 Past Month at Unknown time  . traZODone (DESYREL) 100 MG tablet Take 1 tablet (100 mg total) by mouth at bedtime as needed for sleep. 30 tablet 0 Past Month at Unknown time  . 5-Hydroxytryptophan (5-HTP) 100 MG CAPS Take 100 mg by  mouth daily.    Not Taking at Unknown time  . acetaminophen (TYLENOL) 500 MG tablet Take 500-1,000 mg by mouth every 6 (six) hours as needed for mild pain or headache.    11/01/2019 at 0500  . acetaminophen (TYLENOL) 500 MG tablet Take 2 tablets (1,000 mg total) by mouth every 6 (six) hours. 30 tablet 0 10/31/18 at 0500  . Ascorbic Acid (VITAMIN C) 1000 MG tablet Take 1,000 mg by mouth daily.   Not Taking at Unknown time  . aspirin EC 81 MG EC tablet Take 1 tablet (81 mg total) by mouth daily. 30 tablet 0 09/01/2019  . Aspirin-Salicylamide-Caffeine (BC HEADACHE POWDER PO) Take 1 packet by mouth daily as needed (for pain or headache).    09/01/2019  . cholecalciferol (VITAMIN D) 1000 units tablet Take 1,000 Units by mouth daily.   Not Taking at Unknown time  . Cranberry 1000 MG CAPS Take 1,000 mg by mouth daily.    Not Taking at Unknown time  . DULoxetine (CYMBALTA) 20 MG capsule Take 40 mg by mouth daily.     Marland Kitchen EPINEPHrine (EPIPEN 2-PAK) 0.3 mg/0.3 mL IJ SOAJ injection Inject 0.3 mg into the muscle once as needed (severe allergic reaction).     . gabapentin (NEURONTIN) 300 MG capsule Take 1 capsule (300  mg total) by mouth 3 (three) times daily. 300mg  morning, afternoon and 900mg  before bed (Patient not taking: Reported on 11/01/2019) 150 capsule 3 Not Taking at Unknown time  . hydrocortisone 2.5 % ointment Apply topically 2 (two) times daily. (Patient not taking: Reported on 11/01/2019) 30 g 0 Not Taking at Unknown time  . linaclotide (LINZESS) 145 MCG CAPS capsule Take 1 capsule (145 mcg total) by mouth daily before breakfast. (Patient not taking: Reported on 11/01/2019) 30 capsule 0 Not Taking at Unknown time  . magic mouthwash w/lidocaine SOLN Take 10 mLs by mouth 4 (four) times daily as needed for mouth pain. 120 mL 0   . Magnesium 200 MG TABS Take 200 mg by mouth every morning.    Not Taking at Unknown time   Allergies  Allergen Reactions  . Naproxen Anaphylaxis   Past Medical History:  Diagnosis Date   . Abnormal uterine bleeding   . ADHD (attention deficit hyperactivity disorder)   . Allergy   . Anxiety   . Bipolar 1 disorder ([place])   . Bipolar disorder ([place])   . Cancer [facility])    uterine cancer  . DDD (degenerative disc disease)   . Depression   . HSV infection   . Hx: UTI (urinary tract infection)   . Pertussis   . Seizures ([place])    last seizure 12/2014   . Thyroid disease    Past Surgical History:  Procedure Laterality Date  . ABDOMINAL HYSTERECTOMY    . CERVICAL SPINE SURGERY     x 3 2001,2005,2007 c5-6  . CESAREAN SECTION  2003  . EMBOLIZATION N/A 12/16/2017   Procedure: EMBOLIZATION;  Surgeon: Algernon Huxley, MD;  Location: [place] CV LAB;  Service: Cardiovascular;  Laterality: N/A;  . HYSTEROSCOPY N/A 10/11/2017   Procedure: HYSTEROSCOPY;  Surgeon: Harlin Heys, MD;  Location: [facility] ORS;  Service: Gynecology;  Laterality: N/A;  . HYSTEROSCOPY WITH D & C N/A 10/11/2017   Procedure: DILATATION AND CURETTAGE /HYSTEROSCOPY;  Surgeon: Harlin Heys, MD;  Location: [facility] ORS;  Service: Gynecology;  Laterality: N/A;  . LAPAROSCOPIC BILATERAL SALPINGO OOPHERECTOMY Bilateral 12/15/2017   Procedure: LAPAROSCOPIC BILATERAL SALPINGO OOPHORECTOMY;  Surgeon: Mellody Drown, MD;  Location: [facility] ORS;  Service: Gynecology;  Laterality: Bilateral;  . LAPAROSCOPIC HYSTERECTOMY Bilateral 12/15/2017   Procedure: HYSTERECTOMY TOTAL LAPAROSCOPIC;  Surgeon: Mellody Drown, MD;  Location: [facility] ORS;  Service: Gynecology;  Laterality: Bilateral;  . SENTINEL NODE BIOPSY N/A 12/15/2017   Procedure: SENTINEL NODE [id number];  Surgeon: Mellody Drown, MD;  Location: [facility] ORS;  Service: Gynecology;  Laterality: N/A;  . TONSILLECTOMY    . TUBAL LIGATION     Social History   Socioeconomic History  . Marital status: Married    Spouse name: Not on file  . Number of children: 4  . Years of education: Not on file  . Highest education level: Not on file  Occupational History  . Not  on file  Tobacco Use  . Smoking status: Never Smoker  . Smokeless tobacco: Never Used  Substance and Sexual Activity  . Alcohol use: Yes    Alcohol/week: 3.0 standard drinks    Types: 3 Cans of beer per week    Comment: occassionally   . Drug use: Not Currently    Comment: last used in 1990's  . Sexual activity: Yes    Birth control/protection: Surgical  Other Topics Concern  . Not on file  Social History Narrative  . Not on file   Social  Determinants of Health   Financial Resource Strain:   . Difficulty of Paying Living Expenses: Not on file  Food Insecurity:   . Worried About Charity fundraiser in the Last Year: Not on file  . Ran Out of Food in the Last Year: Not on file  Transportation Needs:   . Lack of Transportation (Medical): Not on file  . Lack of Transportation (Non-Medical): Not on file  Physical Activity:   . Days of Exercise per Week: Not on file  . Minutes of Exercise per Session: Not on file  Stress:   . Feeling of Stress : Not on file  Social Connections:   . Frequency of Communication with Friends and Family: Not on file  . Frequency of Social Gatherings with Friends and Family: Not on file  . Attends Religious Services: Not on file  . Active Member of Clubs or Organizations: Not on file  . Attends Archivist Meetings: Not on file  . Marital Status: Not on file  Intimate Partner Violence:   . Fear of Current or Ex-Partner: Not on file  . Emotionally Abused: Not on file  . Physically Abused: Not on file  . Sexually Abused: Not on file   Social History   Social History Narrative  . Not on file     ROS: Negative.     PE: HEENT: Negative. Lungs: Clear. Cardio: RR.   Assessment/Plan:  Proceed with planned upper endoscopy.   Forest Gleason [facility] 11/01/2019

## 2019-11-01 NOTE — Anesthesia Procedure Notes (Signed)
Date/Time: 11/01/2019 9:40 AM Performed by: Doreen Salvage, CRNA Pre-anesthesia Checklist: Patient identified, Emergency Drugs available, Suction available and Patient being monitored Patient Re-evaluated:Patient Re-evaluated prior to induction Oxygen Delivery Method: Nasal cannula Induction Type: IV induction Dental Injury: Teeth and Oropharynx as per pre-operative assessment  Comments: Nasal cannula with etCO2 monitoring

## 2019-11-01 NOTE — Anesthesia Postprocedure Evaluation (Signed)
Anesthesia Post Note  Patient: [name]  Procedure(s) Performed: ESOPHAGOGASTRODUODENOSCOPY (EGD) WITH PROPOFOL (N/A )  Patient location during evaluation: PACU Anesthesia Type: General Level of consciousness: awake and alert Pain management: pain level controlled Vital Signs Assessment: post-procedure vital signs reviewed and stable Respiratory status: spontaneous breathing, nonlabored ventilation, respiratory function stable and patient connected to nasal cannula oxygen Cardiovascular status: blood pressure returned to baseline and stable Postop Assessment: no apparent nausea or vomiting Anesthetic complications: no     Last Vitals:  Vitals:   11/01/19 0957 11/01/19 1007  BP: 139/89 (!) 139/99  Pulse: (!) 102 100  Resp: 13 17  Temp: (!) 36 C   SpO2: 100% 100%    Last Pain:  Vitals:   11/01/19 1007  TempSrc:   PainSc: 0-No pain                 Arita Miss

## 2019-11-01 NOTE — Anesthesia Preprocedure Evaluation (Signed)
Anesthesia Evaluation  Patient identified by MRN, date of birth, ID band Patient awake    Reviewed: Allergy & Precautions, NPO status , Patient's Chart, lab work & pertinent test results  History of Anesthesia Complications Negative for: history of anesthetic complications  Airway Mallampati: II  TM Distance: >3 FB Neck ROM: Full    Dental  (+) Teeth Intact, Partial Upper   Pulmonary neg pulmonary ROS, neg COPD, Patient abstained from smoking.Not current smoker,    Pulmonary exam normal breath sounds clear to auscultation       Cardiovascular Exercise Tolerance: Good METS(-) hypertension(-) CAD and (-) Past MI negative cardio ROS   Rhythm:Regular Rate:Normal - Systolic murmurs    Neuro/Psych Anxiety Depression Bipolar Disorder Hx seizures in past, none in last few years. negative neurological ROS  negative psych ROS   GI/Hepatic Neg liver ROS, neg GERD  ,  Endo/Other  neg diabetes  Renal/GU negative Renal ROS     Musculoskeletal   Abdominal   Peds  Hematology   Anesthesia Other Findings   Reproductive/Obstetrics                             Anesthesia Physical Anesthesia Plan  ASA: II  Anesthesia Plan: General   Post-op Pain Management:    Induction: Intravenous  PONV Risk Score and Plan: 3 and Ondansetron, Propofol infusion and TIVA  Airway Management Planned: Nasal Cannula  Additional Equipment: None  Intra-op Plan:   Post-operative Plan:   Informed Consent: I have reviewed the patients History and Physical, chart, labs and discussed the procedure including the risks, benefits and alternatives for the proposed anesthesia with the patient or authorized representative who has indicated his/her understanding and acceptance.     Dental advisory given  Plan Discussed with: CRNA and Surgeon  Anesthesia Plan Comments: (Discussed risks of anesthesia with patient, including  possibility of difficulty with spontaneous ventilation under anesthesia necessitating airway intervention, PONV, and rare risks such as cardiac or respiratory events. Patient understands.)        Anesthesia Quick Evaluation

## 2019-11-02 LAB — SURGICAL PATHOLOGY

## 2019-11-30 ENCOUNTER — Ambulatory Visit: Admit: 2019-11-30 | Payer: Medicare HMO | Admitting: Internal Medicine

## 2019-11-30 SURGERY — ESOPHAGOGASTRODUODENOSCOPY (EGD) WITH PROPOFOL
Anesthesia: General

## 2020-03-04 ENCOUNTER — Ambulatory Visit: Payer: Self-pay

## 2020-03-08 ENCOUNTER — Ambulatory Visit: Payer: Medicare HMO

## 2020-08-24 ENCOUNTER — Other Ambulatory Visit: Payer: Self-pay

## 2020-08-24 ENCOUNTER — Emergency Department
Admission: EM | Admit: 2020-08-24 | Discharge: 2020-08-24 | Disposition: A | Discharge status: Home / Self Care | Payer: Medicare HMO | Attending: Emergency Medicine | Admitting: Emergency Medicine

## 2020-08-24 ENCOUNTER — Encounter: Payer: Self-pay | Admitting: Emergency Medicine

## 2020-08-24 DIAGNOSIS — Z7982 Long term (current) use of aspirin: Secondary | ICD-10-CM | POA: Diagnosis not present

## 2020-08-24 DIAGNOSIS — E039 Hypothyroidism, unspecified: Secondary | ICD-10-CM | POA: Insufficient documentation

## 2020-08-24 DIAGNOSIS — Z8542 Personal history of malignant neoplasm of other parts of uterus: Secondary | ICD-10-CM | POA: Diagnosis not present

## 2020-08-24 DIAGNOSIS — M791 Myalgia, unspecified site: Secondary | ICD-10-CM | POA: Insufficient documentation

## 2020-08-24 DIAGNOSIS — M62838 Other muscle spasm: Secondary | ICD-10-CM | POA: Diagnosis present

## 2020-08-24 MED ORDER — ORPHENADRINE CITRATE 30 MG/ML IJ SOLN
60.0000 mg | Freq: Two times a day (BID) | INTRAMUSCULAR | Status: DC
  Administered 2020-08-24: 60 mg via INTRAMUSCULAR
  Filled 2020-08-24: qty 2

## 2020-08-24 MED ORDER — ORPHENADRINE CITRATE ER 100 MG PO TB12: 100.0000 mg | ORAL_TABLET | Freq: Two times a day (BID) | ORAL | 0 refills | Status: DC

## 2020-08-24 MED ORDER — METHYLPREDNISOLONE 4 MG PO TBPK: ORAL_TABLET | ORAL | 0 refills | Status: DC

## 2020-08-24 MED ORDER — METHYLPREDNISOLONE SODIUM SUCC 125 MG IJ SOLR
125.0000 mg | Freq: Once | INTRAMUSCULAR | Status: AC
  Administered 2020-08-24: 125 mg via INTRAMUSCULAR
  Filled 2020-08-24: qty 2

## 2020-08-24 NOTE — ED Triage Notes (Signed)
Patient with complaint of muscle pain to bilateral wrist and left shoulder that started yesterday morning.

## 2020-08-24 NOTE — ED Provider Notes (Signed)
[facility] Emergency Department Provider Note   ____________________________________________   First MD Initiated Contact with Patient 08/24/20 [phone]     (approximate)  I have reviewed the triage vital signs and the nursing notes.   HISTORY  Chief Complaint Spasms    HPI [name] is a 54 y.o. female patient complain of muscle spasms to bilateral upper extremities that started yesterday.  Patient states she has has decreased grip bilaterally.  No specific provocative or complaint.  Patient was told years ago that she was developing rheumatoid arthritis.  Patient is currently under pain management and has adequate pain medications.  Rates her pain/discomfort as 10 over 10.  Patient states Percocet is not helping with the spasms.         Past Medical History:  Diagnosis Date  . Abnormal uterine bleeding   . ADHD (attention deficit hyperactivity disorder)   . Allergy   . Anxiety   . Bipolar 1 disorder ([place])   . Bipolar disorder ([place])   . Cancer [facility])    uterine cancer  . DDD (degenerative disc disease)   . Depression   . HSV infection   . Hx: UTI (urinary tract infection)   . Pertussis   . Seizures ([place])    last seizure 12/2014   . Thyroid disease     Patient Active Problem List   Diagnosis Date Noted  . Protein-calorie malnutrition, severe 12/31/2017  . Abdominal pain 12/30/2017  . Acute blood loss anemia 12/17/2017  . S/P laparoscopic hysterectomy 12/15/2017  . 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 Hypotension 09/12/2016  . Right facial numbness 09/11/2016  . Stroke-like symptom 09/11/2016  . Seizure ([place]) 09/11/2016  . Hypothyroidism 09/11/2016  . Stroke-like symptoms 09/11/2016  . Fall   . Syncope 11/20/2015  . Scalp laceration 11/20/2015  . 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 DDD (degenerative disc disease)   . ACUTE BRONCHITIS 10/22/2009  . GERD  08/02/2009  . DEGENERATIVE DISC DISEASE 08/02/2009  . COUGH 08/02/2009  . Bipolar disorder ([place]) 08/01/2009  . Alcoholism ([place]) 08/01/2009  . BULIMIA 08/01/2009  . HEARING LOSS, BILATERAL 08/01/2009  . OSTEOPENIA 08/01/2009  . SEIZURE DISORDER 08/01/2009    Past Surgical History:  Procedure Laterality Date  . ABDOMINAL HYSTERECTOMY    . CERVICAL SPINE SURGERY     x 3 2001,2005,2007 c5-6  . CESAREAN SECTION  2003  . EMBOLIZATION N/A 12/16/2017   Procedure: EMBOLIZATION;  Surgeon: Algernon Huxley, MD;  Location: [facility];  Service: Cardiovascular;  Laterality: N/A;  . ESOPHAGOGASTRODUODENOSCOPY (EGD) WITH PROPOFOL N/A 11/01/2019   Procedure: ESOPHAGOGASTRODUODENOSCOPY (EGD) WITH PROPOFOL;  Surgeon: Robert Bellow, MD;  Location: [facility];  Service: Endoscopy;  Laterality: N/A;  POSITIVE ON 09/29/19 and 10/06/19  . HYSTEROSCOPY N/A 10/11/2017   Procedure: HYSTEROSCOPY;  Surgeon: Harlin Heys, MD;  Location: [facility];  Service: Gynecology;  Laterality: N/A;  . HYSTEROSCOPY WITH D & C N/A 10/11/2017   Procedure: DILATATION AND CURETTAGE /HYSTEROSCOPY;  Surgeon: Harlin Heys, MD;  Location: [facility];  Service: Gynecology;  Laterality: N/A;  . LAPAROSCOPIC BILATERAL SALPINGO OOPHERECTOMY Bilateral 12/15/2017   Procedure: LAPAROSCOPIC BILATERAL SALPINGO OOPHORECTOMY;  Surgeon: Mellody Drown, MD;  Location: [facility];  Service: Gynecology;  Laterality: Bilateral;  . LAPAROSCOPIC HYSTERECTOMY Bilateral 12/15/2017   Procedure: HYSTERECTOMY TOTAL LAPAROSCOPIC;  Surgeon: Mellody Drown, MD;  Location: [facility];  Service: Gynecology;  Laterality: Bilateral;  . SENTINEL NODE BIOPSY  N/A 12/15/2017   Procedure: SENTINEL NODE CXKGYJ,EHUDJSH;  Surgeon: Mellody Drown, MD;  Location: [facility];  Service: Gynecology;  Laterality: N/A;  . TONSILLECTOMY    . TUBAL LIGATION      Prior to Admission medications   Medication Sig Start Date End Date Taking? Authorizing Provider    5-Hydroxytryptophan (5-HTP) 100 MG CAPS Take 100 mg by mouth daily.     [provider]  acetaminophen (TYLENOL) 500 MG tablet Take 500-1,000 mg by mouth every 6 (six) hours as needed for mild pain or headache.     [provider]  acetaminophen (TYLENOL) 500 MG tablet Take 2 tablets (1,000 mg total) by mouth every 6 (six) hours. 01/05/18   Ward, [name], MD  acyclovir (ZOVIRAX) 400 MG tablet Take 400 mg by mouth twice daily 08/09/17   [provider]  amphetamine-dextroamphetamine (ADDERALL XR) 20 MG 24 hr capsule Take 40 mg by mouth daily.    [provider]  Artificial Tear Ointment (DRY EYES OP) Place 1 drop into both eyes daily as needed (for dry eyes).    [provider]  Ascorbic Acid (VITAMIN C) 1000 MG tablet Take 1,000 mg by mouth daily.    [provider]  aspirin EC 81 MG EC tablet Take 1 tablet (81 mg total) by mouth daily. 09/14/16   Rosita Fire, MD  Aspirin-Salicylamide-Caffeine [facility] HEADACHE POWDER PO) Take 1 packet by mouth daily as needed (for pain or headache).     [provider]  cholecalciferol (VITAMIN D) 1000 units tablet Take 1,000 Units by mouth daily.    [provider]  Cranberry 1000 MG CAPS Take 1,000 mg by mouth daily.     [provider]  cyclobenzaprine (FLEXERIL) 5 MG tablet Take 5 mg by mouth 3 (three) times daily as needed for muscle spasms.  09/09/16   [provider]  doxycycline (VIBRA-TABS) 100 MG tablet Take 100 mg by mouth 2 (two) times daily.    [provider]  DULoxetine (CYMBALTA) 20 MG capsule Take 40 mg by mouth daily.    [provider]  EPINEPHrine (EPIPEN 2-PAK) 0.3 mg/0.3 mL IJ SOAJ injection Inject 0.3 mg into the muscle once as needed (severe allergic reaction).    [provider]  gabapentin (NEURONTIN) 300 MG capsule Take 1 capsule (300 mg total) by mouth 3 (three) times daily. 300mg  morning, afternoon and 900mg  before  bed Patient not taking: Reported on 11/01/2019 12/17/17   Ward, [name], MD  hydrocortisone 2.5 % ointment Apply topically 2 (two) times daily. Patient not taking: Reported on 11/01/2019 05/29/19   Sherrie George B, FNP  lamoTRIgine (LAMICTAL) 100 MG tablet Take 100 mg by mouth every morning.  08/02/15   [provider]  linaclotide Rolan Lipa) 145 MCG CAPS capsule Take 1 capsule (145 mcg total) by mouth daily before breakfast. Patient not taking: Reported on 11/01/2019 12/20/17   Verlon Au, NP  magic mouthwash w/lidocaine SOLN Take 10 mLs by mouth 4 (four) times daily as needed for mouth pain. 05/29/19   Triplett, Johnette Abraham B, FNP  Magnesium 200 MG TABS Take 200 mg by mouth every morning.     [provider]  methylPREDNISolone (MEDROL DOSEPAK) 4 MG TBPK tablet Take Tapered dose as directed 08/24/20   Sable Feil, PA-C  milk thistle 175 MG tablet Take 175 mg by mouth 2 (two) times daily.     [provider]  ondansetron (ZOFRAN) 4 MG tablet Take 1  tablet (4 mg total) by mouth every 8 (eight) hours as needed for nausea or vomiting. 12/24/17   Verlon Au, NP  orphenadrine (NORFLEX) 100 MG tablet Take 1 tablet (100 mg total) by mouth 2 (two) times daily. 08/24/20   Sable Feil, PA-C  oxyCODONE 10 MG TABS Take 1 tablet (10 mg total) by mouth every 4 (four) hours as needed for moderate pain. 01/05/18   Ward, [name], MD  Oxycodone HCl 10 MG TABS Take 10 mg by mouth 4 (four) times daily as needed.    [provider]  polyethylene glycol (MIRALAX / GLYCOLAX) packet Take 17 g by mouth daily. 01/05/18   Ward, [name], MD  traZODone (DESYREL) 100 MG tablet Take 1 tablet (100 mg total) by mouth at bedtime as needed for sleep. 11/22/15   Reyne Dumas, MD    Allergies Naproxen  Family History  Problem Relation Age of Onset  . Cervical cancer Mother   . Cervical cancer Maternal Grandmother   . Polycythemia Maternal Grandmother   . Stomach cancer Father   . Colon  cancer Paternal Grandmother   . Cervical cancer Cousin   . Cervical cancer Cousin   . Diabetes Mellitus II Neg Hx   . CAD Neg Hx   . Colon polyps Neg Hx   . Heart disease Neg Hx   . Rectal cancer Neg Hx     Social History Social History   Tobacco Use  . Smoking status: Never Smoker  . Smokeless tobacco: Never Used  Vaping Use  . Vaping Use: Some days  Substance Use Topics  . Alcohol use: Yes    Comment: occassionally   . Drug use: Not Currently    Comment: last used in 1990's    Review of Systems  Constitutional: No fever/chills Eyes: No visual changes. ENT: No sore throat. Cardiovascular: Denies chest pain. Respiratory: Denies shortness of breath. Gastrointestinal: No abdominal pain.  No nausea, no vomiting.  No diarrhea.  No constipation. Genitourinary: Negative for dysuria. Musculoskeletal: Bilateral upper extremities muscle spasms. Skin: Negative for rash. Neurological: Negative for headaches, focal weakness or numbness. Psychiatric:  ADHD, anxiety, bipolar, and depression. Endocrine:  Hypothyroidism.  Allergic/Immunilogical: Naproxen ____________________________________________   PHYSICAL EXAM:  VITAL SIGNS: ED Triage Vitals  Enc Vitals Group     BP 08/24/20 0418 (!) 122/54     Pulse Rate 08/24/20 0418 92     Resp 08/24/20 0418 18     Temp 08/24/20 0418 98.8 F (37.1 C)     Temp Source 08/24/20 0418 Oral     SpO2 08/24/20 0418 100 %     Weight 08/24/20 0419 112 lb (50.8 kg)     Height 08/24/20 0419 5\' 1"  (1.549 m)     Head Circumference --      Peak Flow --      Pain Score 08/24/20 0419 10     Pain Loc --      Pain Edu? --      Excl. in [place]? --    Constitutional: Alert and oriented. Well appearing and in no acute distress. Neck: No cervical spine tenderness to palpation. Hematological/Lymphatic/Immunilogical: No cervical lymphadenopathy. Cardiovascular: Normal rate, regular rhythm. Grossly normal heart sounds.  Good peripheral  circulation. Respiratory: Normal respiratory effort.  No retractions. Lungs CTAB. Musculoskeletal: Obvious deformity to the bilateral upper extremities.  Patient has full neck range of motion.  Patient is moderate guarding palpation bilateral mid humerus. Neurologic:  Normal speech and language. No gross  focal neurologic deficits are appreciated. No gait instability. Skin:  Skin is warm, dry and intact. No rash noted.  No abrasions, ecchymosis,  Edema, erythema. Psychiatric: Mood and affect are normal. Speech and behavior are normal.  ____________________________________________   LABS (all labs ordered are listed, but only abnormal results are displayed)  Labs Reviewed - No data to display ____________________________________________  EKG   ____________________________________________  RADIOLOGY I, Sable Feil, personally viewed and evaluated these images (plain radiographs) as part of my medical decision making, as well as reviewing the written report by the radiologist.  ED MD interpretation:    Official radiology report(s): No results found.  ____________________________________________   PROCEDURES  Procedure(s) performed (including Critical Care):  Procedures   ____________________________________________   INITIAL IMPRESSION / ASSESSMENT AND PLAN / ED COURSE  As part of my medical decision making, I reviewed the following data within the [facility]         Patient presents onset bilateral muscle spasm upper extremities.  Patient given discharge care instructions and a prescription for Norflex and Norflex.  Patient advised to follow-up PCP.     ____________________________________________   FINAL CLINICAL IMPRESSION(S) / ED DIAGNOSES  Final diagnoses:  Myalgia     ED Discharge Orders         Ordered    orphenadrine (NORFLEX) 100 MG tablet  2 times daily        08/24/20 0739    methylPREDNISolone (MEDROL DOSEPAK) 4 MG TBPK  tablet        08/24/20 0740          *Please note:  Terrance Mass was evaluated in Emergency Department on 08/24/2020 for the symptoms described in the history of present illness. She was evaluated in the context of the global COVID-19 pandemic, which necessitated consideration that the patient might be at risk for infection with the SARS-CoV-2 virus that causes COVID-19. Institutional protocols and algorithms that pertain to the evaluation of patients at risk for COVID-19 are in a state of rapid change based on information released by regulatory bodies including the CDC and federal and state organizations. These policies and algorithms were followed during the patient's care in the ED.  Some ED evaluations and interventions may be delayed as a result of limited staffing during and the pandemic.*   Note:  This document was prepared using Dragon voice recognition software and may include unintentional dictation errors.    Sable Feil, PA-C 08/24/20 3545    Delman Kitten, MD 08/24/20 1316

## 2020-08-24 NOTE — ED Notes (Signed)
Pt provided  With  Phone to call ride home.

## 2020-08-24 NOTE — Discharge Instructions (Addendum)
Follow discharge care instruction take medication as directed.  Follow-up PCP for further evaluation and treatment.

## 2021-03-31 ENCOUNTER — Observation Stay
Admission: EM | Admit: 2021-03-31 | Discharge: 2021-04-01 | Disposition: A | Discharge status: Home / Self Care | Payer: Medicare HMO | Attending: Internal Medicine | Admitting: Internal Medicine

## 2021-03-31 ENCOUNTER — Emergency Department: Payer: Medicare HMO

## 2021-03-31 ENCOUNTER — Other Ambulatory Visit: Payer: Self-pay

## 2021-03-31 ENCOUNTER — Encounter: Payer: Self-pay | Admitting: Emergency Medicine

## 2021-03-31 DIAGNOSIS — R569 Unspecified convulsions: Secondary | ICD-10-CM

## 2021-03-31 DIAGNOSIS — E039 Hypothyroidism, unspecified: Secondary | ICD-10-CM | POA: Diagnosis not present

## 2021-03-31 DIAGNOSIS — K529 Noninfective gastroenteritis and colitis, unspecified: Secondary | ICD-10-CM | POA: Diagnosis not present

## 2021-03-31 DIAGNOSIS — K219 Gastro-esophageal reflux disease without esophagitis: Secondary | ICD-10-CM | POA: Diagnosis present

## 2021-03-31 DIAGNOSIS — Z28310 Unvaccinated for covid-19: Secondary | ICD-10-CM | POA: Diagnosis not present

## 2021-03-31 DIAGNOSIS — Z79899 Other long term (current) drug therapy: Secondary | ICD-10-CM | POA: Insufficient documentation

## 2021-03-31 DIAGNOSIS — U071 COVID-19: Secondary | ICD-10-CM | POA: Diagnosis not present

## 2021-03-31 DIAGNOSIS — F319 Bipolar disorder, unspecified: Secondary | ICD-10-CM | POA: Diagnosis present

## 2021-03-31 DIAGNOSIS — Z7982 Long term (current) use of aspirin: Secondary | ICD-10-CM | POA: Diagnosis not present

## 2021-03-31 DIAGNOSIS — F502 Bulimia nervosa: Secondary | ICD-10-CM | POA: Diagnosis present

## 2021-03-31 DIAGNOSIS — R52 Pain, unspecified: Secondary | ICD-10-CM | POA: Diagnosis present

## 2021-03-31 DIAGNOSIS — Z8542 Personal history of malignant neoplasm of other parts of uterus: Secondary | ICD-10-CM | POA: Diagnosis not present

## 2021-03-31 DIAGNOSIS — I959 Hypotension, unspecified: Secondary | ICD-10-CM | POA: Diagnosis present

## 2021-03-31 DIAGNOSIS — F332 Major depressive disorder, recurrent severe without psychotic features: Secondary | ICD-10-CM

## 2021-03-31 LAB — URINALYSIS, COMPLETE (UACMP) WITH MICROSCOPIC
Bilirubin Urine: NEGATIVE
Glucose, UA: 50 mg/dL — AB
Hgb urine dipstick: NEGATIVE
Ketones, ur: NEGATIVE mg/dL
Leukocytes,Ua: NEGATIVE
Nitrite: NEGATIVE
Protein, ur: 100 mg/dL — AB
Specific Gravity, Urine: 1.013 (ref 1.005–1.030)
pH: 6 (ref 5.0–8.0)

## 2021-03-31 LAB — COMPREHENSIVE METABOLIC PANEL
ALT: 15 U/L (ref 0–44)
AST: 27 U/L (ref 15–41)
Albumin: 3.8 g/dL (ref 3.5–5.0)
Alkaline Phosphatase: 166 U/L — ABNORMAL HIGH (ref 38–126)
Anion gap: 10 (ref 5–15)
BUN: 14 mg/dL (ref 6–20)
CO2: 23 mmol/L (ref 22–32)
Calcium: 9.1 mg/dL (ref 8.9–10.3)
Chloride: 105 mmol/L (ref 98–111)
Creatinine, Ser: 1.08 mg/dL — ABNORMAL HIGH (ref 0.44–1.00)
GFR, Estimated: >60 mL/min (ref >60)
Glucose, Bld: 117 mg/dL — ABNORMAL HIGH (ref 70–99)
Potassium: 4.1 mmol/L (ref 3.5–5.1)
Sodium: 138 mmol/L (ref 135–145)
Total Bilirubin: 0.8 mg/dL (ref 0.3–1.2)
Total Protein: 7.5 g/dL (ref 6.5–8.1)

## 2021-03-31 LAB — CBC WITH DIFFERENTIAL/PLATELET
Abs Immature Granulocytes: 0.05 10*3/uL (ref 0.00–0.07)
Basophils Absolute: 0 10*3/uL (ref 0.0–0.1)
Basophils Relative: 0 %
Eosinophils Absolute: 0 10*3/uL (ref 0.0–0.5)
Eosinophils Relative: 1 %
HCT: 38.1 % (ref 36.0–46.0)
Hemoglobin: 12.3 g/dL (ref 12.0–15.0)
Immature Granulocytes: 1 %
Lymphocytes Relative: 7 %
Lymphs Abs: 0.5 10*3/uL — ABNORMAL LOW (ref 0.7–4.0)
MCH: 28.3 pg (ref 26.0–34.0)
MCHC: 32.3 g/dL (ref 30.0–36.0)
MCV: 87.6 fL (ref 80.0–100.0)
Monocytes Absolute: 0 10*3/uL — ABNORMAL LOW (ref 0.1–1.0)
Monocytes Relative: 1 %
Neutro Abs: 5.8 10*3/uL (ref 1.7–7.7)
Neutrophils Relative %: 90 %
Platelets: 256 10*3/uL (ref 150–400)
RBC: 4.35 MIL/uL (ref 3.87–5.11)
RDW: 14.9 % (ref 11.5–15.5)
Smear Review: NORMAL
WBC: 6.3 10*3/uL (ref 4.0–10.5)
nRBC: 0 % (ref 0.0–0.2)

## 2021-03-31 LAB — RESP PANEL BY RT-PCR (FLU A&B, COVID) ARPGX2
Influenza A by PCR: NEGATIVE
Influenza B by PCR: NEGATIVE
SARS Coronavirus 2 by RT PCR: POSITIVE — AB

## 2021-03-31 LAB — LIPASE, BLOOD: Lipase: 32 U/L (ref 11–51)

## 2021-03-31 LAB — CK: Total CK: 28 U/L — ABNORMAL LOW (ref 38–234)

## 2021-03-31 LAB — LACTIC ACID, PLASMA
Lactic Acid, Venous: 3 mmol/L (ref 0.5–1.9)
Lactic Acid, Venous: 2.5 mmol/L (ref 0.5–1.9)

## 2021-03-31 MED ORDER — AMPHETAMINE-DEXTROAMPHET ER 20 MG PO CP24
40.0000 mg | ORAL_CAPSULE | Freq: Every day | ORAL | Status: DC
  Filled 2021-03-31: qty 2

## 2021-03-31 MED ORDER — SODIUM CHLORIDE 0.9 % IV SOLN
200.0000 mg | Freq: Once | INTRAVENOUS | Status: AC
  Administered 2021-03-31: 200 mg via INTRAVENOUS
  Filled 2021-03-31: qty 40

## 2021-03-31 MED ORDER — TRAZODONE HCL 100 MG PO TABS
100.0000 mg | ORAL_TABLET | Freq: Every evening | ORAL | Status: DC | PRN
Start: 2021-03-31 — End: 2021-04-01

## 2021-03-31 MED ORDER — GABAPENTIN 300 MG PO CAPS
300.0000 mg | ORAL_CAPSULE | Freq: Two times a day (BID) | ORAL | Status: DC
  Administered 2021-03-31 – 2021-04-01 (×3): 300 mg via ORAL
  Filled 2021-03-31 (×3): qty 1

## 2021-03-31 MED ORDER — GABAPENTIN 300 MG PO CAPS
900.0000 mg | ORAL_CAPSULE | Freq: Every day | ORAL | Status: DC
  Filled 2021-03-31: qty 3

## 2021-03-31 MED ORDER — LACTATED RINGERS IV BOLUS (SEPSIS)
1000.0000 mL | Freq: Once | INTRAVENOUS | Status: AC
  Administered 2021-03-31: 1000 mL via INTRAVENOUS

## 2021-03-31 MED ORDER — ONDANSETRON 4 MG PO TBDP
4.0000 mg | ORAL_TABLET | Freq: Once | ORAL | Status: AC | PRN
  Administered 2021-03-31: 4 mg via ORAL

## 2021-03-31 MED ORDER — SODIUM CHLORIDE 0.9 % IV BOLUS
500.0000 mL | Freq: Once | INTRAVENOUS | Status: AC
  Administered 2021-03-31: 500 mL via INTRAVENOUS

## 2021-03-31 MED ORDER — ONDANSETRON 4 MG PO TBDP
ORAL_TABLET | ORAL | Status: AC
  Filled 2021-03-31: qty 1

## 2021-03-31 MED ORDER — ORPHENADRINE CITRATE ER 100 MG PO TB12
100.0000 mg | ORAL_TABLET | Freq: Two times a day (BID) | ORAL | Status: DC
  Filled 2021-03-31 (×3): qty 1

## 2021-03-31 MED ORDER — ENOXAPARIN SODIUM 40 MG/0.4ML IJ SOSY
40.0000 mg | PREFILLED_SYRINGE | INTRAMUSCULAR | Status: DC
  Administered 2021-03-31: 40 mg via SUBCUTANEOUS
  Filled 2021-03-31: qty 0.4

## 2021-03-31 MED ORDER — DULOXETINE HCL 20 MG PO CPEP
40.0000 mg | ORAL_CAPSULE | Freq: Every day | ORAL | Status: DC
  Administered 2021-04-01: 40 mg via ORAL
  Filled 2021-03-31 (×2): qty 2

## 2021-03-31 MED ORDER — ASCORBIC ACID 500 MG PO TABS
1000.0000 mg | ORAL_TABLET | Freq: Every day | ORAL | Status: DC
  Administered 2021-04-01: 1000 mg via ORAL
  Filled 2021-03-31: qty 2

## 2021-03-31 MED ORDER — ONDANSETRON HCL 4 MG PO TABS: 4.0000 mg | ORAL_TABLET | Freq: Four times a day (QID) | ORAL | Status: DC | PRN

## 2021-03-31 MED ORDER — LACTATED RINGERS IV BOLUS (SEPSIS)
250.0000 mL | Freq: Once | INTRAVENOUS | Status: AC
  Administered 2021-03-31: 250 mL via INTRAVENOUS

## 2021-03-31 MED ORDER — VITAMIN D 25 MCG (1000 UNIT) PO TABS
1000.0000 [IU] | ORAL_TABLET | Freq: Every day | ORAL | Status: DC
  Administered 2021-04-01: 1000 [IU] via ORAL
  Filled 2021-03-31: qty 1

## 2021-03-31 MED ORDER — LAMOTRIGINE 100 MG PO TABS
100.0000 mg | ORAL_TABLET | ORAL | Status: DC
  Administered 2021-04-01: 100 mg via ORAL
  Filled 2021-03-31: qty 1

## 2021-03-31 MED ORDER — ACETAMINOPHEN 325 MG PO TABS
650.0000 mg | ORAL_TABLET | Freq: Four times a day (QID) | ORAL | Status: DC | PRN
  Administered 2021-04-01: 650 mg via ORAL
  Filled 2021-03-31: qty 2

## 2021-03-31 MED ORDER — GABAPENTIN 300 MG PO CAPS: 300.0000 mg | ORAL_CAPSULE | Freq: Three times a day (TID) | ORAL | Status: DC

## 2021-03-31 MED ORDER — MAGNESIUM OXIDE -MG SUPPLEMENT 400 (240 MG) MG PO TABS
200.0000 mg | ORAL_TABLET | ORAL | Status: DC
  Administered 2021-04-01: 200 mg via ORAL
  Filled 2021-03-31: qty 1

## 2021-03-31 MED ORDER — SODIUM CHLORIDE 0.9 % IV SOLN
100.0000 mg | Freq: Every day | INTRAVENOUS | Status: DC
  Administered 2021-04-01: 100 mg via INTRAVENOUS
  Filled 2021-03-31 (×2): qty 20

## 2021-03-31 MED ORDER — OXYCODONE HCL ER 10 MG PO T12A
10.0000 mg | EXTENDED_RELEASE_TABLET | Freq: Two times a day (BID) | ORAL | Status: AC
  Administered 2021-03-31: 10 mg via ORAL
  Filled 2021-03-31: qty 1

## 2021-03-31 MED ORDER — ACETAMINOPHEN 325 MG PO TABS: 325.0000 mg | ORAL_TABLET | Freq: Four times a day (QID) | ORAL | Status: DC | PRN

## 2021-03-31 MED ORDER — ONDANSETRON HCL 4 MG/2ML IJ SOLN: 4.0000 mg | Freq: Four times a day (QID) | INTRAMUSCULAR | Status: DC | PRN

## 2021-03-31 MED ORDER — ACETAMINOPHEN 500 MG PO TABS
1000.0000 mg | ORAL_TABLET | Freq: Once | ORAL | Status: AC
  Administered 2021-03-31: 1000 mg via ORAL
  Filled 2021-03-31: qty 2

## 2021-03-31 MED ORDER — GUAIFENESIN-DM 100-10 MG/5ML PO SYRP
10.0000 mL | ORAL_SOLUTION | ORAL | Status: DC | PRN
  Administered 2021-04-01 (×2): 10 mL via ORAL
  Filled 2021-03-31 (×2): qty 10

## 2021-03-31 NOTE — ED Triage Notes (Signed)
Pt states nausea and diarrhea that started today, pt also c/o sneezing and runny nose x 3 days. Pt also c/o intermittent SHOB, able to speak in full and complete sentences at this time.

## 2021-03-31 NOTE — ED Triage Notes (Signed)
First RN Note: Pt to ED via POV with c/o N/V/Cough/Body aches/chills x several days.

## 2021-03-31 NOTE — H&P (Signed)
History and Physical   [name] OZH:[id number] DOB: 1966/02/01 DOA: 03/31/2021  PCP: Sandi Mariscal, MD  Outpatient Specialists: Dr. Wyona Almas, dermatology Patient coming from: Home  I have personally briefly reviewed patient's old medical records in [place].  Chief Concern: Body aches, nausea, vomiting  HPI: [name] is a 55 y.o. female with medical history significant for GERD, major depressive disorder, hypothyroid, history of seizure, bipolar disorder, acne vulgaris, history of bulimia, presents to the emergency department for chief concerns of body aches, nausea, vomiting.  She reports that she has been experiencing the symptoms over the last 3 days.  She denies sick contacts.  She denies vomiting of blood or coffee-ground emesis.  At bedside she denies shortness of breath and chest pain.  She reports that she has generalized body aches all over.  Social history: Patient states that she lives at home by herself.  She denies tobacco, EtOH, recreational drug use.  She is not vaccinated for COVID-19.  ROS:  Constitutional: no weight change, no fever ENT/Mouth: no sore throat, no rhinorrhea Eyes: no eye pain, no vision changes Cardiovascular: no chest pain, + dyspnea,  no edema, no palpitations Respiratory: no cough, no sputum, no wheezing Gastrointestinal: + nausea, + vomiting, no diarrhea, no constipation Genitourinary: no urinary incontinence, no dysuria, no hematuria Musculoskeletal: no arthralgias, + myalgias Skin: no skin lesions, no pruritus, Neuro: + weakness, no loss of consciousness, no syncope Psych: no anxiety, no depression, + decrease appetite Heme/Lymph: no bruising, no bleeding  ED Course: Discussed with ED provider, patient requiring hospitalization for COVID-19 infection.  Vitals in the emergency department was remarkable for temperature of 98.7, respiration rate of 18, heart rate of 102, blood pressure 82/51, SPO2 100% on room  air.  Labs in the emergency department unremarkable.  ED provider ordered CK which was 28.  Lactic acid was 3.0.  COVID PCR was positive  Assessment/Plan  Active Problems:   Bipolar disorder (HCC)   BULIMIA   GERD   Major depressive disorder, recurrent severe without psychotic features ([place])   Seizure ([place])   Hypothyroidism   Hypotension   Gastroenteritis   COVID-19 virus infection  Gastroenteritis secondary to COVID-19 infection Met sepsis criteria with elevated respiration rate initially, sinus tachycardia, source of COVID-19, elevated lactic acid -Lactic acid is downtrending appropriately - IV remdesivir per pharmacy -Patient is not requiring oxygen supplementation, therefore steroids was not indicated - Portable chest x-ray is clear, low clinical suspicion bacterial pneumonia superimposed infection - Antibiotics was not ordered - Incentive spirometry and flutter valve for 10 reps every 2 hours while awake - Albuterol inhaler 2 puffs every 4 hours while awake, budesonide 2 puffs inhaler twice daily - Daily labs: CMP, CBC, CRP, D-dimer - Supplemental oxygen to maintain SPO2 goal of greater than 88% - Airborne and contact precautions  History of seizures-resumed home lamotrigine 100 mg every morning -Patient states that she has not had a seizure in many years and endorses compliance with seizure medication -Seizure precaution  Chronic pain- at baseline patient takes oxycodone 10 mg every 4 hours as needed for moderate pain however given that patient was borderline low normotensive this has not been resumed - I ordered 1 dose of oxycodone 12hours, 38m once for pain -I discussed extensively with patient the risk of increased opioid use with hypotension and COVID infection including respiratory depression  Peripheral neuropathy-resumed home gabapentin 3083mtwice daily, gabapentin 900 mg nightly  Insomnia-trazodone 100 mg nightly as needed for sleep  ADHD-resumed home  Adderall  Depression/anxiety-duloxetine 40 mg daily resumed  A.m. team to complete med reconciliation  Chart reviewed.   DVT prophylaxis: Enoxaparin 40 mg every 24 hours subcutaneous Code Status: Full code Diet: Heart healthy Family Communication: No Disposition Plan: Pending clinical course Consults called: No Admission status: MedSurg, observation, 24-hour telemetry ordered  Past Medical History:  Diagnosis Date  . Abnormal uterine bleeding   . ADHD (attention deficit hyperactivity disorder)   . Allergy   . Anxiety   . Bipolar 1 disorder ([place])   . Bipolar disorder ([place])   . Cancer [facility])    uterine cancer  . DDD (degenerative disc disease)   . Depression   . HSV infection   . Hx: UTI (urinary tract infection)   . Pertussis   . Seizures ([place])    last seizure 12/2014   . Thyroid disease    Past Surgical History:  Procedure Laterality Date  . ABDOMINAL HYSTERECTOMY    . CERVICAL SPINE SURGERY     x 3 2001,2005,2007 c5-6  . CESAREAN SECTION  2003  . EMBOLIZATION N/A 12/16/2017   Procedure: EMBOLIZATION;  Surgeon: Algernon Huxley, MD;  Location: [facility];  Service: Cardiovascular;  Laterality: N/A;  . ESOPHAGOGASTRODUODENOSCOPY (EGD) WITH PROPOFOL N/A 11/01/2019   Procedure: ESOPHAGOGASTRODUODENOSCOPY (EGD) WITH PROPOFOL;  Surgeon: Robert Bellow, MD;  Location: [facility];  Service: Endoscopy;  Laterality: N/A;  POSITIVE ON 09/29/19 and 10/06/19  . HYSTEROSCOPY N/A 10/11/2017   Procedure: HYSTEROSCOPY;  Surgeon: Harlin Heys, MD;  Location: [facility];  Service: Gynecology;  Laterality: N/A;  . HYSTEROSCOPY WITH D & C N/A 10/11/2017   Procedure: DILATATION AND CURETTAGE /HYSTEROSCOPY;  Surgeon: Harlin Heys, MD;  Location: [facility] ORS;  Service: Gynecology;  Laterality: N/A;  . LAPAROSCOPIC BILATERAL SALPINGO OOPHERECTOMY Bilateral 12/15/2017   Procedure: LAPAROSCOPIC BILATERAL SALPINGO OOPHORECTOMY;  Surgeon: Mellody Drown, MD;  Location: [facility];   Service: Gynecology;  Laterality: Bilateral;  . LAPAROSCOPIC HYSTERECTOMY Bilateral 12/15/2017   Procedure: HYSTERECTOMY TOTAL LAPAROSCOPIC;  Surgeon: Mellody Drown, MD;  Location: [facility];  Service: Gynecology;  Laterality: Bilateral;  . SENTINEL NODE BIOPSY N/A 12/15/2017   Procedure: SENTINEL NODE GTXMIW,OEHOZYY;  Surgeon: Mellody Drown, MD;  Location: [facility] ORS;  Service: Gynecology;  Laterality: N/A;  . TONSILLECTOMY    . TUBAL LIGATION     Social History:  reports that she has never smoked. She has never used smokeless tobacco. She reports current alcohol use. She reports previous drug use.  Allergies  Allergen Reactions  . Naproxen Anaphylaxis  . Morphine And Related Itching   Family History  Problem Relation Age of Onset  . Cervical cancer Mother   . Cervical cancer Maternal Grandmother   . Polycythemia Maternal Grandmother   . Stomach cancer Father   . Colon cancer Paternal Grandmother   . Cervical cancer Cousin   . Cervical cancer Cousin   . Diabetes Mellitus II Neg Hx   . CAD Neg Hx   . Colon polyps Neg Hx   . Heart disease Neg Hx   . Rectal cancer Neg Hx    Family history: Family history reviewed and not pertinent  Prior to Admission medications   Medication Sig Start Date End Date Taking? Authorizing Provider  5-Hydroxytryptophan (5-HTP) 100 MG CAPS Take 100 mg by mouth daily.     [provider]  acetaminophen (TYLENOL) 500 MG tablet Take 500-1,000 mg by mouth every 6 (six) hours as needed for mild pain or  headache.     [provider]  acetaminophen (TYLENOL) 500 MG tablet Take 2 tablets (1,000 mg total) by mouth every 6 (six) hours. 01/05/18   Ward, [name], MD  acyclovir (ZOVIRAX) 400 MG tablet Take 400 mg by mouth 2 (two) times daily. 08/09/17   [provider]  amphetamine-dextroamphetamine (ADDERALL XR) 20 MG 24 hr capsule Take 40 mg by mouth daily.    [provider]  Artificial Tear Ointment (DRY EYES OP) Place 1  drop into both eyes daily as needed (for dry eyes).    [provider]  Ascorbic Acid (VITAMIN C) 1000 MG tablet Take 1,000 mg by mouth daily.    [provider]  aspirin EC 81 MG EC tablet Take 1 tablet (81 mg total) by mouth daily. 09/14/16   Rosita Fire, MD  Aspirin-Salicylamide-Caffeine [facility] HEADACHE POWDER PO) Take 1 packet by mouth daily as needed (for pain or headache).     [provider]  cholecalciferol (VITAMIN D) 1000 units tablet Take 1,000 Units by mouth daily.    [provider]  Cranberry 1000 MG CAPS Take 1,000 mg by mouth daily.     [provider]  cyclobenzaprine (FLEXERIL) 5 MG tablet Take 5 mg by mouth 3 (three) times daily as needed for muscle spasms.  09/09/16   [provider]  doxycycline (VIBRA-TABS) 100 MG tablet Take 100 mg by mouth 2 (two) times daily.    [provider]  DULoxetine (CYMBALTA) 20 MG capsule Take 40 mg by mouth daily.    [provider]  EPINEPHrine (EPIPEN 2-PAK) 0.3 mg/0.3 mL IJ SOAJ injection Inject 0.3 mg into the muscle once as needed (severe allergic reaction).    [provider]  gabapentin (NEURONTIN) 300 MG capsule Take 1 capsule (300 mg total) by mouth 3 (three) times daily. 363m morning, afternoon and 9062mbefore bed Patient not taking: Reported on 11/01/2019 12/17/17   Ward, ChHonor LohMD  hydrocortisone 2.5 % ointment Apply topically 2 (two) times daily. Patient not taking: Reported on 11/01/2019 05/29/19   TrSherrie George, FNP  lamoTRIgine (LAMICTAL) 100 MG tablet Take 100 mg by mouth every morning.  08/02/15   [provider]  linaclotide (LRolan Lipa145 MCG CAPS capsule Take 1 capsule (145 mcg total) by mouth daily before breakfast. Patient not taking: Reported on 11/01/2019 12/20/17   AlVerlon AuNP  magic mouthwash w/lidocaine SOLN Take 10 mLs by mouth 4 (four) times daily as needed for mouth pain. 05/29/19   Triplett, CaJohnette Abraham, FNP  Magnesium 200  MG TABS Take 200 mg by mouth every morning.     [provider]  methylPREDNISolone (MEDROL DOSEPAK) 4 MG TBPK tablet Take Tapered dose as directed 08/24/20   SmSable FeilPA-C  milk thistle 175 MG tablet Take 175 mg by mouth 2 (two) times daily.     [provider]  ondansetron (ZOFRAN) 4 MG tablet Take 1 tablet (4 mg total) by mouth every 8 (eight) hours as needed for nausea or vomiting. 12/24/17   AlVerlon AuNP  orphenadrine (NORFLEX) 100 MG tablet Take 1 tablet (100 mg total) by mouth 2 (two) times daily. 08/24/20   SmSable FeilPA-C  oxyCODONE 10 MG TABS Take 1 tablet (10 mg total) by mouth every 4 (four) hours as needed for moderate pain. 01/05/18   Ward, ChHonor LohMD  Oxycodone HCl 10 MG TABS Take 10 mg by mouth 4 (four) times  daily as needed.    [provider]  polyethylene glycol (MIRALAX / GLYCOLAX) packet Take 17 g by mouth daily. 01/05/18   Ward, [name], MD  traZODone (DESYREL) 100 MG tablet Take 1 tablet (100 mg total) by mouth at bedtime as needed for sleep. 11/22/15   Reyne Dumas, MD   Physical Exam: Vitals:   03/31/21 1957 03/31/21 2028 03/31/21 2215 03/31/21 2303  BP: 95/62 (!) 92/53 (!) 96/51 (!) 91/51  Pulse: 97 100 94 85  Resp: '20 20 20 20  ' Temp: 98.9 F (37.2 C) 99.1 F (37.3 C) 99.2 F (37.3 C) 98.9 F (37.2 C)  TempSrc: Oral Oral Oral Oral  SpO2: 96% 100% 100% 100%  Weight:   55.8 kg   Height:   5' 0.98" (1.549 m)    Constitutional: appears age-appropriate, NAD, calm, comfortable Eyes: PERRL, lids and conjunctivae normal ENMT: Mucous membranes are moist. Posterior pharynx clear of any exudate or lesions.  Poor dentition.  Mild to moderate hearing loss Neck: normal, supple, no masses, no thyromegaly Respiratory: clear to auscultation bilaterally, no wheezing, no crackles. Normal respiratory effort. No accessory muscle use.  Cardiovascular: Regular rate and rhythm, no murmurs / rubs / gallops. No extremity edema. 2+ pedal  pulses. No carotid bruits.  Abdomen: no tenderness, no masses palpated, no hepatosplenomegaly. Bowel sounds positive.  Musculoskeletal: no clubbing / cyanosis. No joint deformity upper and lower extremities. Good ROM, no contractures, no atrophy. Normal muscle tone.  Skin: no rashes, lesions, ulcers. No induration Neurologic: Sensation intact. Strength 5/5 in all 4.  Psychiatric: Normal judgment and insight. Alert and oriented x 3. Normal mood.   EKG: independently reviewed, showing sinus tachycardia with rate of 120, QTc 494  Chest x-ray on Admission: I personally reviewed and I agree with radiologist reading as below.  DG Chest 2 View  Result Date: 03/31/2021 CLINICAL DATA:  Pt to ED via POV with c/o N/V/Cough/Body aches/chills x several days. Patient also vomiting, stated she felt like she was going to pass out. Pt denies any cardiopulmonary hx. Pt does use a vape.SOB/Cough EXAM: CHEST - 2 VIEW COMPARISON:  None. FINDINGS: Normal mediastinum and cardiac silhouette. Normal pulmonary vasculature. No evidence of effusion, infiltrate, or pneumothorax. No acute bony abnormality. Anterior cervical fusion. IMPRESSION: No acute cardiopulmonary process. Electronically Signed   By: Suzy Bouchard M.D.   On: 03/31/2021 16:22   Labs on Admission: I have personally reviewed following labs  CBC: Recent Labs  Lab 03/31/21 1521  WBC 6.3  NEUTROABS 5.8  HGB 12.3  HCT 38.1  MCV 87.6  PLT 443   Basic Metabolic Panel: Recent Labs  Lab 03/31/21 1521  NA 138  K 4.1  CL 105  CO2 23  GLUCOSE 117*  BUN 14  CREATININE 1.08*  CALCIUM 9.1   GFR: Estimated Creatinine Clearance: 43.9 mL/min (A) (by C-G formula based on SCr of 1.08 mg/dL (H)).  Liver Function Tests: Recent Labs  Lab 03/31/21 1521  AST 27  ALT 15  ALKPHOS 166*  BILITOT 0.8  PROT 7.5  ALBUMIN 3.8   Recent Labs  Lab 03/31/21 1521  LIPASE 32   Cardiac Enzymes: Recent Labs  Lab 03/31/21 1504  CKTOTAL 28*   Urine  analysis:    Component Value Date/Time   COLORURINE YELLOW (A) 03/31/2021 1521   APPEARANCEUR CLOUDY (A) 03/31/2021 1521   LABSPEC 1.013 03/31/2021 1521   PHURINE 6.0 03/31/2021 1521   GLUCOSEU 50 (A) 03/31/2021 1521   HGBUR NEGATIVE 03/31/2021  [place] 03/31/2021 1521   KETONESUR NEGATIVE 03/31/2021 1521   PROTEINUR 100 (A) 03/31/2021 1521   UROBILINOGEN 0.2 05/26/2015 0722   NITRITE NEGATIVE 03/31/2021 1521   LEUKOCYTESUR NEGATIVE 03/31/2021 1521   Shyheim Tanney N Geovonni Meyerhoff D.O. [facility]  If 7PM-7AM, please contact overnight-coverage provider If 7AM-7PM, please contact day coverage provider [email or web address]  03/31/2021, 11:42 PM

## 2021-03-31 NOTE — ED Provider Notes (Signed)
[facility] Emergency Department Provider Note ____________________________________________   Event Date/Time   First MD Initiated Contact with Patient 03/31/21 1542     (approximate)  I have reviewed the triage vital signs and the nursing notes.   HISTORY  Chief Complaint Generalized Body Aches, Nausea, Abscess, and Emesis    HPI [name] is a 55 y.o. female with PMH as noted below including seizure disorder, hypothyroidism, DDD, chronic back pain, who presents with multiple symptoms over the last 3 to 4 days, gradual onset, and persistent course.  The patient reports body aches, generalized weakness, nausea, vomiting, and diarrhea, as well as a nonproductive cough and some shortness of breath.  She has not been vaccinated for COVID.  She has not taken a COVID test yet.  Past Medical History:  Diagnosis Date  . Abnormal uterine bleeding   . ADHD (attention deficit hyperactivity disorder)   . Allergy   . Anxiety   . Bipolar 1 disorder ([place])   . Bipolar disorder ([place])   . Cancer [facility])    uterine cancer  . DDD (degenerative disc disease)   . Depression   . HSV infection   . Hx: UTI (urinary tract infection)   . Pertussis   . Seizures ([place])    last seizure 12/2014   . Thyroid disease     Patient Active Problem List   Diagnosis Date Noted  . Gastroenteritis 03/31/2021  . Protein-calorie malnutrition, severe 12/31/2017  . Abdominal pain 12/30/2017  . Acute blood loss anemia 12/17/2017  . S/P laparoscopic hysterectomy 12/15/2017  . 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 Hypotension 09/12/2016  . Right facial numbness 09/11/2016  . Stroke-like symptom 09/11/2016  . Seizure ([place]) 09/11/2016  . Hypothyroidism 09/11/2016  . Stroke-like symptoms 09/11/2016  . Fall   . Syncope 11/20/2015  . Scalp laceration 11/20/2015  . Major depressive disorder, recurrent severe without psychotic features ([place]) 03/23/2015  . DDD  (degenerative disc disease)   . ACUTE BRONCHITIS 10/22/2009  . GERD 08/02/2009  . DEGENERATIVE DISC DISEASE 08/02/2009  . COUGH 08/02/2009  . Bipolar disorder ([place]) 08/01/2009  . Alcoholism ([place]) 08/01/2009  . BULIMIA 08/01/2009  . HEARING LOSS, BILATERAL 08/01/2009  . OSTEOPENIA 08/01/2009  . SEIZURE DISORDER 08/01/2009    Past Surgical History:  Procedure Laterality Date  . ABDOMINAL HYSTERECTOMY    . CERVICAL SPINE SURGERY     x 3 2001,2005,2007 c5-6  . CESAREAN SECTION  2003  . EMBOLIZATION N/A 12/16/2017   Procedure: EMBOLIZATION;  Surgeon: Algernon Huxley, MD;  Location: [facility];  Service: Cardiovascular;  Laterality: N/A;  . ESOPHAGOGASTRODUODENOSCOPY (EGD) WITH PROPOFOL N/A 11/01/2019   Procedure: ESOPHAGOGASTRODUODENOSCOPY (EGD) WITH PROPOFOL;  Surgeon: Robert Bellow, MD;  Location: [facility] ENDOSCOPY;  Service: Endoscopy;  Laterality: N/A;  POSITIVE ON 09/29/19 and 10/06/19  . HYSTEROSCOPY N/A 10/11/2017   Procedure: HYSTEROSCOPY;  Surgeon: Harlin Heys, MD;  Location: [facility] ORS;  Service: Gynecology;  Laterality: N/A;  . HYSTEROSCOPY WITH D & C N/A 10/11/2017   Procedure: DILATATION AND CURETTAGE /HYSTEROSCOPY;  Surgeon: Harlin Heys, MD;  Location: [facility] ORS;  Service: Gynecology;  Laterality: N/A;  . LAPAROSCOPIC BILATERAL SALPINGO OOPHERECTOMY Bilateral 12/15/2017   Procedure: LAPAROSCOPIC BILATERAL SALPINGO OOPHORECTOMY;  Surgeon: Mellody Drown, MD;  Location: [facility] ORS;  Service: Gynecology;  Laterality: Bilateral;  . LAPAROSCOPIC HYSTERECTOMY Bilateral 12/15/2017   Procedure: HYSTERECTOMY TOTAL LAPAROSCOPIC;  Surgeon: Mellody Drown, MD;  Location: [facility] ORS;  Service: Gynecology;  Laterality:  Bilateral;  . SENTINEL NODE BIOPSY N/A 12/15/2017   Procedure: SENTINEL NODE OVZCHY,IFOYDXA;  Surgeon: Mellody Drown, MD;  Location: [facility] ORS;  Service: Gynecology;  Laterality: N/A;  . TONSILLECTOMY    . TUBAL LIGATION      Prior to Admission  medications   Medication Sig Start Date End Date Taking? Authorizing Provider  5-Hydroxytryptophan (5-HTP) 100 MG CAPS Take 100 mg by mouth daily.     [provider]  acetaminophen (TYLENOL) 500 MG tablet Take 500-1,000 mg by mouth every 6 (six) hours as needed for mild pain or headache.     [provider]  acetaminophen (TYLENOL) 500 MG tablet Take 2 tablets (1,000 mg total) by mouth every 6 (six) hours. 01/05/18   Ward, [name], MD  acyclovir (ZOVIRAX) 400 MG tablet Take 400 mg by mouth twice daily 08/09/17   [provider]  amphetamine-dextroamphetamine (ADDERALL XR) 20 MG 24 hr capsule Take 40 mg by mouth daily.    [provider]  Artificial Tear Ointment (DRY EYES OP) Place 1 drop into both eyes daily as needed (for dry eyes).    [provider]  Ascorbic Acid (VITAMIN C) 1000 MG tablet Take 1,000 mg by mouth daily.    [provider]  aspirin EC 81 MG EC tablet Take 1 tablet (81 mg total) by mouth daily. 09/14/16   Rosita Fire, MD  Aspirin-Salicylamide-Caffeine [facility] HEADACHE POWDER PO) Take 1 packet by mouth daily as needed (for pain or headache).     [provider]  cholecalciferol (VITAMIN D) 1000 units tablet Take 1,000 Units by mouth daily.    [provider]  Cranberry 1000 MG CAPS Take 1,000 mg by mouth daily.     [provider]  cyclobenzaprine (FLEXERIL) 5 MG tablet Take 5 mg by mouth 3 (three) times daily as needed for muscle spasms.  09/09/16   [provider]  doxycycline (VIBRA-TABS) 100 MG tablet Take 100 mg by mouth 2 (two) times daily.    [provider]  DULoxetine (CYMBALTA) 20 MG capsule Take 40 mg by mouth daily.    [provider]  EPINEPHrine (EPIPEN 2-PAK) 0.3 mg/0.3 mL IJ SOAJ injection Inject 0.3 mg into the muscle once as needed (severe allergic reaction).    [provider]  gabapentin (NEURONTIN) 300 MG capsule Take 1 capsule (300 mg  total) by mouth 3 (three) times daily. 300mg  morning, afternoon and 900mg  before bed Patient not taking: Reported on 11/01/2019 12/17/17   Ward, [name], MD  hydrocortisone 2.5 % ointment Apply topically 2 (two) times daily. Patient not taking: Reported on 11/01/2019 05/29/19   Sherrie George B, FNP  lamoTRIgine (LAMICTAL) 100 MG tablet Take 100 mg by mouth every morning.  08/02/15   [provider]  linaclotide Rolan Lipa) 145 MCG CAPS capsule Take 1 capsule (145 mcg total) by mouth daily before breakfast. Patient not taking: Reported on 11/01/2019 12/20/17   Verlon Au, NP  magic mouthwash w/lidocaine SOLN Take 10 mLs by mouth 4 (four) times daily as needed for mouth pain. 05/29/19   Triplett, Johnette Abraham B, FNP  Magnesium 200 MG TABS Take 200 mg by mouth every morning.     [provider]  methylPREDNISolone (MEDROL DOSEPAK) 4 MG TBPK tablet Take Tapered dose as directed 08/24/20   Sable Feil, PA-C  milk thistle 175 MG tablet Take 175 mg by mouth 2 (two) times daily.     [provider]  ondansetron [facility])  4 MG tablet Take 1 tablet (4 mg total) by mouth every 8 (eight) hours as needed for nausea or vomiting. 12/24/17   Verlon Au, NP  orphenadrine (NORFLEX) 100 MG tablet Take 1 tablet (100 mg total) by mouth 2 (two) times daily. 08/24/20   Sable Feil, PA-C  oxyCODONE 10 MG TABS Take 1 tablet (10 mg total) by mouth every 4 (four) hours as needed for moderate pain. 01/05/18   Ward, [name], MD  Oxycodone HCl 10 MG TABS Take 10 mg by mouth 4 (four) times daily as needed.    [provider]  polyethylene glycol (MIRALAX / GLYCOLAX) packet Take 17 g by mouth daily. 01/05/18   Ward, [name], MD  traZODone (DESYREL) 100 MG tablet Take 1 tablet (100 mg total) by mouth at bedtime as needed for sleep. 11/22/15   Reyne Dumas, MD    Allergies Naproxen and Morphine and related  Family History  Problem Relation Age of Onset  . Cervical cancer Mother   . Cervical  cancer Maternal Grandmother   . Polycythemia Maternal Grandmother   . Stomach cancer Father   . Colon cancer Paternal Grandmother   . Cervical cancer Cousin   . Cervical cancer Cousin   . Diabetes Mellitus II Neg Hx   . CAD Neg Hx   . Colon polyps Neg Hx   . Heart disease Neg Hx   . Rectal cancer Neg Hx     Social History Social History   Tobacco Use  . Smoking status: Never Smoker  . Smokeless tobacco: Never Used  Vaping Use  . Vaping Use: Some days  Substance Use Topics  . Alcohol use: Yes    Comment: occassionally   . Drug use: Not Currently    Comment: last used in 1990's    Review of Systems  Constitutional: Positive for chills and rigors. Eyes: No visual changes. ENT: No sore throat. Cardiovascular: Denies chest pain. Respiratory: Positive for cough and shortness of breath. Gastrointestinal: Positive for vomiting and diarrhea.  Genitourinary: Negative for dysuria.  Musculoskeletal: Positive for body aches. Skin: Negative for rash. Neurological: Positive for headache.   ____________________________________________   PHYSICAL EXAM:  VITAL SIGNS: ED Triage Vitals  Enc Vitals Group     BP 03/31/21 1517 (!) 81/50     Pulse Rate 03/31/21 1517 (!) 114     Resp 03/31/21 1517 17     Temp 03/31/21 1517 98.7 F (37.1 C)     Temp Source 03/31/21 1517 Oral     SpO2 03/31/21 1517 100 %     Weight 03/31/21 1511 115 lb (52.2 kg)     Height 03/31/21 1511 5\' 1"  (1.549 m)     Head Circumference --      Peak Flow --      Pain Score 03/31/21 1510 8     Pain Loc --      Pain Edu? --      Excl. in [place]? --     Constitutional: Alert and oriented.  Relatively well appearing and in no acute distress. Eyes: Conjunctivae are normal.  Head: Atraumatic. Nose: No congestion/rhinnorhea. Mouth/Throat: Mucous membranes are moist.   Neck: Normal range of motion.  Cardiovascular: Tachycardic, regular rhythm. Grossly normal heart sounds.  Good peripheral  circulation. Respiratory: Normal respiratory effort.  No retractions. Lungs CTAB. Gastrointestinal: Soft and nontender. No distention.  Genitourinary: No flank tenderness. Musculoskeletal: No lower extremity edema.  Extremities warm and well perfused.  Neurologic:  Normal  speech and language. No gross focal neurologic deficits are appreciated.  Skin:  Skin is warm and dry. No rash noted. Psychiatric: Mood and affect are normal. Speech and behavior are normal.  ____________________________________________   LABS (all labs ordered are listed, but only abnormal results are displayed)  Labs Reviewed  RESP PANEL BY RT-PCR (FLU A&B, COVID) ARPGX2 - Abnormal; Notable for the following components:      Result Value   SARS Coronavirus 2 by RT PCR POSITIVE (*)    All other components within normal limits  CBC WITH DIFFERENTIAL/PLATELET - Abnormal; Notable for the following components:   Lymphs Abs 0.5 (*)    Monocytes Absolute 0.0 (*)    All other components within normal limits  COMPREHENSIVE METABOLIC PANEL - Abnormal; Notable for the following components:   Glucose, Bld 117 (*)    Creatinine, Ser 1.08 (*)    Alkaline Phosphatase 166 (*)    All other components within normal limits  URINALYSIS, COMPLETE (UACMP) WITH MICROSCOPIC - Abnormal; Notable for the following components:   Color, Urine YELLOW (*)    APPearance CLOUDY (*)    Glucose, UA 50 (*)    Protein, ur 100 (*)    Bacteria, UA RARE (*)    All other components within normal limits  CK - Abnormal; Notable for the following components:   Total CK 28 (*)    All other components within normal limits  LACTIC ACID, PLASMA - Abnormal; Notable for the following components:   Lactic Acid, Venous 3.0 (*)    All other components within normal limits  LACTIC ACID, PLASMA - Abnormal; Notable for the following components:   Lactic Acid, Venous 2.5 (*)    All other components within normal limits  LIPASE, BLOOD  HIV ANTIBODY (ROUTINE  TESTING W REFLEX)  CBC WITH DIFFERENTIAL/PLATELET  COMPREHENSIVE METABOLIC PANEL  C-REACTIVE PROTEIN  D-DIMER, QUANTITATIVE   ____________________________________________  EKG  ED ECG REPORT I, Arta Silence, the attending physician, personally viewed and interpreted this ECG.  Date: 03/31/2021 EKG Time: 1517 Rate: 120 Rhythm: Sinus tachycardia QRS Axis: normal Intervals: normal ST/T Wave abnormalities: Nonspecific ST abnormalities Narrative Interpretation: Nonspecific abnormalities with no evidence of acute ischemia  ____________________________________________  RADIOLOGY  Chest x-ray interpreted by me shows no focal consolidation or edema  ____________________________________________   PROCEDURES  Procedure(s) performed: No  Procedures  Critical Care performed: No ____________________________________________   INITIAL IMPRESSION / ASSESSMENT AND PLAN / ED COURSE  Pertinent labs & imaging results that were available during my care of the patient were reviewed by me and considered in my medical decision making (see chart for details).  55 year old female with PMH as noted above including seizure disorder, hypothyroidism, DDD and chronic back pain presents with multiple symptoms over the last several days including cough, shortness of breath, body aches, headache, nausea, vomiting, diarrhea, and malaise.  On exam, the patient is relatively well-appearing.  She is somewhat hypotensive and tachycardic with otherwise normal vital signs.  The lungs are clear to auscultation.  Neurologic exam is nonfocal.  The abdomen is soft and nontender.  Physical exam is otherwise unremarkable.  Differential includes COVID-19, other viral syndrome, pneumonia, UTI, other source of infection/sepsis, electrolyte abnormality, other metabolic disturbance.  We will obtain a chest x-ray, COVID swab, lab work-up, give fluids, and  reassess.  ----------------------------------------- 7:18 PM on 03/31/2021 -----------------------------------------  Initial lactate was elevated, however presentation was overall consistent with COVID so I started fluids per the sepsis protocol but did not initiate  empiric antibiotics.  The COVID test is indeed positive.  Lab work-up is otherwise reassuring.  The patient's blood pressure and heart rate have improved.  I consulted Dr. Tobie Poet from the hospitalist service for admission.  ____________________________________________   FINAL CLINICAL IMPRESSION(S) / ED DIAGNOSES  Final diagnoses:  COVID-19      NEW MEDICATIONS STARTED DURING THIS VISIT:  New Prescriptions   No medications on file     Note:  This document was prepared using Dragon voice recognition software and may include unintentional dictation errors.   Arta Silence, MD 03/31/21 [phone]

## 2021-03-31 NOTE — Consult Note (Signed)
Remdesivir - Pharmacy Brief Note   O:  ALT: wnl SpO2: 99% on RA   A/P:  Remdesivir 200 mg IVPB once followed by 100 mg IVPB daily x 4 days.   Dorothe Pea, PharmD, BCPS Clinical Pharmacist  03/31/2021 7:18 PM

## 2021-04-01 DIAGNOSIS — F502 Bulimia nervosa: Secondary | ICD-10-CM

## 2021-04-01 DIAGNOSIS — F3170 Bipolar disorder, currently in remission, most recent episode unspecified: Secondary | ICD-10-CM | POA: Diagnosis not present

## 2021-04-01 DIAGNOSIS — U071 COVID-19: Secondary | ICD-10-CM

## 2021-04-01 LAB — CBC WITH DIFFERENTIAL/PLATELET
Abs Immature Granulocytes: 0.05 10*3/uL (ref 0.00–0.07)
Basophils Absolute: 0.1 10*3/uL (ref 0.0–0.1)
Basophils Relative: 0 %
Eosinophils Absolute: 0.2 10*3/uL (ref 0.0–0.5)
Eosinophils Relative: 1 %
HCT: 29.8 % — ABNORMAL LOW (ref 36.0–46.0)
Hemoglobin: 9.6 g/dL — ABNORMAL LOW (ref 12.0–15.0)
Immature Granulocytes: 0 %
Lymphocytes Relative: 22 %
Lymphs Abs: 2.5 10*3/uL (ref 0.7–4.0)
MCH: 28.6 pg (ref 26.0–34.0)
MCHC: 32.2 g/dL (ref 30.0–36.0)
MCV: 88.7 fL (ref 80.0–100.0)
Monocytes Absolute: 0.7 10*3/uL (ref 0.1–1.0)
Monocytes Relative: 6 %
Neutro Abs: 8 10*3/uL — ABNORMAL HIGH (ref 1.7–7.7)
Neutrophils Relative %: 71 %
Platelets: 181 10*3/uL (ref 150–400)
RBC: 3.36 MIL/uL — ABNORMAL LOW (ref 3.87–5.11)
RDW: 15.5 % (ref 11.5–15.5)
WBC: 11.4 10*3/uL — ABNORMAL HIGH (ref 4.0–10.5)
nRBC: 0 % (ref 0.0–0.2)

## 2021-04-01 LAB — COMPREHENSIVE METABOLIC PANEL
ALT: 13 U/L (ref 0–44)
AST: 21 U/L (ref 15–41)
Albumin: 2.7 g/dL — ABNORMAL LOW (ref 3.5–5.0)
Alkaline Phosphatase: 95 U/L (ref 38–126)
Anion gap: 3 — ABNORMAL LOW (ref 5–15)
BUN: 16 mg/dL (ref 6–20)
CO2: 23 mmol/L (ref 22–32)
Calcium: 8.2 mg/dL — ABNORMAL LOW (ref 8.9–10.3)
Chloride: 106 mmol/L (ref 98–111)
Creatinine, Ser: 1.01 mg/dL — ABNORMAL HIGH (ref 0.44–1.00)
GFR, Estimated: >60 mL/min (ref >60)
Glucose, Bld: 94 mg/dL (ref 70–99)
Potassium: 4.2 mmol/L (ref 3.5–5.1)
Sodium: 132 mmol/L — ABNORMAL LOW (ref 135–145)
Total Bilirubin: 0.7 mg/dL (ref 0.3–1.2)
Total Protein: 5.9 g/dL — ABNORMAL LOW (ref 6.5–8.1)

## 2021-04-01 LAB — HIV ANTIBODY (ROUTINE TESTING W REFLEX): HIV Screen 4th Generation wRfx: NONREACTIVE

## 2021-04-01 LAB — D-DIMER, QUANTITATIVE: D-Dimer, Quant: 1.52 ug/mL-FEU — ABNORMAL HIGH (ref 0.00–0.50)

## 2021-04-01 LAB — C-REACTIVE PROTEIN: CRP: 11 mg/dL — ABNORMAL HIGH (ref <1.0)

## 2021-04-01 MED ORDER — AMPHETAMINE-DEXTROAMPHETAMINE 5 MG PO TABS: 10.0000 mg | ORAL_TABLET | Freq: Two times a day (BID) | ORAL | Status: DC

## 2021-04-01 MED ORDER — HYDROMORPHONE HCL 1 MG/ML IJ SOLN
0.5000 mg | INTRAMUSCULAR | Status: DC | PRN
  Administered 2021-04-01 (×2): 0.5 mg via INTRAVENOUS
  Filled 2021-04-01 (×2): qty 0.5

## 2021-04-01 MED ORDER — GUAIFENESIN-DM 100-10 MG/5ML PO SYRP: 10.0000 mL | ORAL_SOLUTION | ORAL | 0 refills | Status: DC | PRN

## 2021-04-01 MED ORDER — AMPHETAMINE-DEXTROAMPHET ER 5 MG PO CP24
40.0000 mg | ORAL_CAPSULE | Freq: Every day | ORAL | Status: DC
  Administered 2021-04-01: 40 mg via ORAL
  Filled 2021-04-01 (×2): qty 1

## 2021-04-01 MED ORDER — SODIUM CHLORIDE 0.9 % IV BOLUS
250.0000 mL | Freq: Once | INTRAVENOUS | Status: AC
  Administered 2021-04-01: 250 mL via INTRAVENOUS

## 2021-04-01 NOTE — Progress Notes (Signed)
Terrance Mass to be D/C'd home per MD order. Discussed with the patient and all questions fully answered.  Skin clean, dry and intact without evidence of skin break down, no evidence of skin tears noted.  IV catheter discontinued intact. Site without signs and symptoms of complications. Dressing and pressure applied.  An After Visit Summary was printed and given to the patient.  Patient escorted via [place], and D/C home via private auto.  Melonie [place]  04/01/2021

## 2021-04-01 NOTE — Discharge Instructions (Signed)
Person Under Monitoring Name: [name]  Location: [address] [place] [address]   CORONAVIRUS DISEASE 2019 (COVID-19) Guidance for Persons Under Investigation You are being tested for the virus that causes coronavirus disease 2019 (COVID-19). Public health actions are necessary to ensure protection of your health and the health of others, and to prevent further spread of infection. COVID-19 is caused by a virus that can cause symptoms, such as fever, cough, and shortness of breath. The primary transmission from person to person is by coughing or sneezing. On November 24, 2018, the [place] announced a [employer] Emergency of International Concern and on November 25, 2018 the U.S. Department of Health and Human Services declared a public health emergency. If the virus that causesCOVID-19 spreads in the community, it could have severe public health consequences.  As a person under investigation for COVID-19, the [place] advises you to adhere to the following guidance until your test results are reported to you. If your test result is positive, you will receive additional information from your provider and your local health department at that time.   Remain at home until you are cleared by your health provider or public health authorities.   Keep a log of visitors to your home using the form provided. Any visitors to your home must be aware of your isolation status.  If you plan to move to a new address or leave the county, notify the local health department in your county.  Call a doctor or seek care if you have an urgent medical need. Before seeking medical care, call ahead and get instructions from the provider before arriving at the medical office, clinic or hospital. Notify them that you are being tested for the virus that causes COVID-19 so arrangements can be made, as necessary,  to prevent transmission to others in the healthcare setting. Next, notify the local health department in your county.  If a medical emergency arises and you need to call 911, inform the first responders that you are being tested for the virus that causes COVID-19. Next, notify the local health department in your county.  Adhere to all guidance set forth by the [place] for [facility] of patients that is based on guidance from the Center for Disease Control and Prevention with suspected or confirmed COVID-19. It is provided with this guidance for Persons Under Investigation.  Your health and the health of our community are our top priorities. Public Health officials remain available to provide assistance and counseling to you about COVID-19 and compliance with this guidance.  Provider: ____________________________________________________________ Date: ______/_____/_________  By signing below, you acknowledge that you have read and agree to comply with this Guidance for Persons Under Investigation. ______________________________________________________________ Date: ______/_____/_________  WHO DO I CALL? You can find a list of local health departments here: [email or web address] Health Department: ____________________________________________________________________ Contact Name: ________________________________________________________________________ Telephone: ___________________________________________________________________________  [facility] [place], Communicable Disease Branch COVID-19 Guidance for Persons Under Investigation March 7, 2020COVID-19 Quarantine vs. Isolation QUARANTINE keeps someone who was in close contact with someone who has COVID-19 away from others. Quarantine if you have been in close contact with someone who has COVID-19, unless you have been fully  vaccinated. If you are fully vaccinated  You do NOT need to quarantine unless they have symptoms  Get tested 3-5 days after your exposure, even if you don't have symptoms  Wear a mask indoors in public for 14 days following exposure or until your test result is negative If you are not fully vaccinated  Stay home for 14 days after your last contact with a person who has COVID-19  Watch for fever (100.90F), cough, shortness of breath, or other symptoms of COVID-19  If possible, stay away from people you live with, especially people who are at higher risk for getting very sick from [place] your local public health department for options in your area to possibly shorten your quarantine ISOLATION keeps someone who is sick or tested positive for COVID-19 without symptoms away from others, even in their own home. People who are in isolation should stay home and stay in a specific "sick room" or area and use a separate bathroom (if available). If you are sick and think or know you have COVID-19 Stay home until after  At least 10 days since symptoms first appeared and  At least 24 hours with no fever without the use of fever-reducing medications and  Symptoms have improved If you tested positive for COVID-19 but do not have symptoms  Stay home until after 10 days have passed since your positive viral test  If you develop symptoms after testing positive, follow the steps above for those who are sick [email or web address] 07/22/2020 This information is not intended to replace advice given to you by your health care provider. Make sure you discuss any questions you have with your health care provider. Document Revised: 08/26/2020 Document Reviewed: 08/26/2020 Elsevier Patient Education  2021 [employer].

## 2021-04-02 NOTE — Discharge Summary (Signed)
at [place] NAME: [name]    MR#:  [id number]  DATE OF BIRTH:  07-23-1966  DATE OF ADMISSION:  03/31/2021   ADMITTING PHYSICIAN: Amy N Cox, DO  DATE OF DISCHARGE: 04/01/2021  4:56 PM  PRIMARY CARE PHYSICIAN: Sandi Mariscal, MD   ADMISSION DIAGNOSIS:  Gastroenteritis [K52.9] COVID-19 [U07.1] DISCHARGE DIAGNOSIS:  Active Problems:   Bipolar disorder ([place])   BULIMIA   GERD   Major depressive disorder, recurrent severe without psychotic features ([place])   Seizure ([place])   Hypothyroidism   Hypotension   Gastroenteritis   COVID-19  SECONDARY DIAGNOSIS:   Past Medical History:  Diagnosis Date  . Abnormal uterine bleeding   . ADHD (attention deficit hyperactivity disorder)   . Allergy   . Anxiety   . Bipolar 1 disorder ([place])   . Bipolar disorder ([place])   . Cancer [facility])    uterine cancer  . DDD (degenerative disc disease)   . Depression   . HSV infection   . Hx: UTI (urinary tract infection)   . Pertussis   . Seizures ([place])    last seizure 12/2014   . Thyroid disease    HOSPITAL COURSE:  55 y.o. female with medical history significant for GERD, major depressive disorder, hypothyroid, history of seizure, bipolar disorder, acne vulgaris, history of bulimia admitted for chief concerns of body aches, nausea, vomiting.  Gastroenteritis secondary to COVID-19 infection Sepsis ruled out -Patient was feeling much better and was not having any vomiting while here. She was tolerating diet and preferred to be at home.  History of seizures- Chronic pain- stable on home regimen. F/w pain specialist  Peripheral neuropathy Insomnia ADHD Depression/anxiety   DISCHARGE CONDITIONS:  stable CONSULTS OBTAINED:   DRUG ALLERGIES:   Allergies  Allergen Reactions  . Naproxen Anaphylaxis  . Morphine And Related Itching   DISCHARGE MEDICATIONS:   Allergies as of 04/01/2021      Reactions   Naproxen Anaphylaxis   Morphine And Related Itching       Medication List    TAKE these medications   acetaminophen 500 MG tablet Commonly known as: TYLENOL Take 500-1,000 mg by mouth every 6 (six) hours as needed for mild pain or headache.   amphetamine-dextroamphetamine 10 MG tablet Commonly known as: ADDERALL Take 10 mg by mouth 2 (two) times daily.   aspirin 81 MG EC tablet Take 1 tablet (81 mg total) by mouth daily.   cholecalciferol 1000 units tablet Commonly known as: VITAMIN D Take 1,000 Units by mouth daily.   cyclobenzaprine 10 MG tablet Commonly known as: FLEXERIL Take 10 mg by mouth 3 (three) times daily as needed for muscle spasms.   DULoxetine 30 MG capsule Commonly known as: CYMBALTA Take 30 mg by mouth 2 (two) times daily.   EPINEPHrine 0.3 mg/0.3 mL Soaj injection Commonly known as: EPI-PEN Inject 0.3 mg into the muscle once as needed (severe allergic reaction).   guaiFENesin-dextromethorphan 100-10 MG/5ML syrup Commonly known as: ROBITUSSIN DM Take 10 mLs by mouth every 4 (four) hours as needed for cough.   lamoTRIgine 100 MG tablet Commonly known as: LAMICTAL Take 100 mg by mouth every morning.   oxybutynin 5 MG tablet Commonly known as: DITROPAN Take 5 mg by mouth 2 (two) times daily.   Oxycodone HCl 10 MG Tabs Take 10 mg by mouth 4 (four) times daily as needed (moderate to severe pain).   traZODone 100 MG tablet Commonly known as: DESYREL Take 1 tablet (  100 mg total) by mouth at bedtime as needed for sleep.      DISCHARGE INSTRUCTIONS:   DIET:  Regular diet DISCHARGE CONDITION:  Stable ACTIVITY:  Activity as tolerated OXYGEN:  Home Oxygen: No.  Oxygen Delivery: room air DISCHARGE LOCATION:  home   If you experience worsening of your admission symptoms, develop shortness of breath, life threatening emergency, suicidal or homicidal thoughts you must seek medical attention immediately by calling 911 or calling your MD immediately  if symptoms less severe.  You Must read complete  instructions/literature along with all the possible adverse reactions/side effects for all the Medicines you take and that have been prescribed to you. Take any new Medicines after you have completely understood and accpet all the possible adverse reactions/side effects.   Please note  You were cared for by a hospitalist during your hospital stay. If you have any questions about your discharge medications or the care you received while you were in the hospital after you are discharged, you can call the unit and asked to speak with the hospitalist on call if the hospitalist that took care of you is not available. Once you are discharged, your primary care physician will handle any further medical issues. Please note that NO REFILLS for any discharge medications will be authorized once you are discharged, as it is imperative that you return to your primary care physician (or establish a relationship with a primary care physician if you do not have one) for your aftercare needs so that they can reassess your need for medications and monitor your lab values.    On the day of Discharge:  VITAL SIGNS:  Blood pressure 118/69, pulse 95, temperature 97.7 F (36.5 C), temperature source Oral, resp. rate 20, height 5' 0.98" (1.549 m), weight 55.8 kg, last menstrual period 09/22/2017, SpO2 98 %. PHYSICAL EXAMINATION:  GENERAL:  55 y.o.-year-old patient lying in the bed with no acute distress.  EYES: Pupils equal, round, reactive to light and accommodation. No scleral icterus. Extraocular muscles intact.  HEENT: Head atraumatic, normocephalic. Oropharynx and nasopharynx clear.  NECK:  Supple, no jugular venous distention. No thyroid enlargement, no tenderness.  LUNGS: Normal breath sounds bilaterally, no wheezing, rales,rhonchi or crepitation. No use of accessory muscles of respiration.  CARDIOVASCULAR: S1, S2 normal. No murmurs, rubs, or gallops.  ABDOMEN: Soft, non-tender, non-distended. Bowel sounds  present. No organomegaly or mass.  EXTREMITIES: No pedal edema, cyanosis, or clubbing.  NEUROLOGIC: Cranial nerves II through XII are intact. Muscle strength 5/5 in all extremities. Sensation intact. Gait not checked.  PSYCHIATRIC: The patient is alert and oriented x 3.  SKIN: No obvious rash, lesion, or ulcer.  DATA REVIEW:   CBC Recent Labs  Lab 04/01/21 0606  WBC 11.4*  HGB 9.6*  HCT 29.8*  PLT 181    Chemistries  Recent Labs  Lab 04/01/21 0606  NA 132*  K 4.2  CL 106  CO2 23  GLUCOSE 94  BUN 16  CREATININE 1.01*  CALCIUM 8.2*  AST 21  ALT 13  ALKPHOS 95  BILITOT 0.7     Outpatient follow-up  Follow-up Information    Sandi Mariscal, MD. Go on 04/08/2021.   Specialty: Internal Medicine Why: 10:45 Contact information: [place] [phone]                  Management plans discussed with the patient, family and they are in agreement.  CODE STATUS: Prior   TOTAL TIME TAKING CARE  OF THIS PATIENT: 45 minutes.    Max Sane M.D on 04/02/2021 at 4:42 PM  [facility]   CC: Primary care physician; Sandi Mariscal, MD   Note: This dictation was prepared with Dragon dictation along with smaller phrase technology. Any transcriptional errors that result from this process are unintentional.

## 2021-10-30 ENCOUNTER — Other Ambulatory Visit: Payer: Self-pay

## 2021-10-30 ENCOUNTER — Encounter: Payer: Self-pay | Admitting: *Deleted

## 2021-10-30 ENCOUNTER — Emergency Department
Admission: EM | Admit: 2021-10-30 | Discharge: 2021-10-30 | Disposition: A | Discharge status: Home / Self Care | Payer: Medicare Other | Attending: Emergency Medicine | Admitting: Emergency Medicine

## 2021-10-30 DIAGNOSIS — Z8541 Personal history of malignant neoplasm of cervix uteri: Secondary | ICD-10-CM | POA: Diagnosis not present

## 2021-10-30 DIAGNOSIS — Z8616 Personal history of COVID-19: Secondary | ICD-10-CM | POA: Diagnosis not present

## 2021-10-30 DIAGNOSIS — T782XXA Anaphylactic shock, unspecified, initial encounter: Secondary | ICD-10-CM | POA: Diagnosis not present

## 2021-10-30 DIAGNOSIS — T7840XA Allergy, unspecified, initial encounter: Secondary | ICD-10-CM | POA: Diagnosis present

## 2021-10-30 DIAGNOSIS — E039 Hypothyroidism, unspecified: Secondary | ICD-10-CM | POA: Diagnosis not present

## 2021-10-30 LAB — CBC
HCT: 36.9 % (ref 36.0–46.0)
Hemoglobin: 12.1 g/dL (ref 12.0–15.0)
MCH: 29.9 pg (ref 26.0–34.0)
MCHC: 32.8 g/dL (ref 30.0–36.0)
MCV: 91.1 fL (ref 80.0–100.0)
Platelets: 215 10*3/uL (ref 150–400)
RBC: 4.05 MIL/uL (ref 3.87–5.11)
RDW: 13.8 % (ref 11.5–15.5)
WBC: 6.3 10*3/uL (ref 4.0–10.5)
nRBC: 0 % (ref 0.0–0.2)

## 2021-10-30 LAB — BASIC METABOLIC PANEL
Anion gap: 6 (ref 5–15)
BUN: 18 mg/dL (ref 6–20)
CO2: 27 mmol/L (ref 22–32)
Calcium: 9.5 mg/dL (ref 8.9–10.3)
Chloride: 104 mmol/L (ref 98–111)
Creatinine, Ser: 0.84 mg/dL (ref 0.44–1.00)
GFR, Estimated: >60 mL/min (ref >60)
Glucose, Bld: 90 mg/dL (ref 70–99)
Potassium: 4.2 mmol/L (ref 3.5–5.1)
Sodium: 137 mmol/L (ref 135–145)

## 2021-10-30 MED ORDER — DIPHENHYDRAMINE HCL 25 MG PO CAPS: 25.0000 mg | ORAL_CAPSULE | Freq: Four times a day (QID) | ORAL | 0 refills | Status: DC | PRN

## 2021-10-30 MED ORDER — FAMOTIDINE 20 MG PO TABS
40.0000 mg | ORAL_TABLET | Freq: Once | ORAL | Status: AC
  Administered 2021-10-30: 40 mg via ORAL
  Filled 2021-10-30: qty 2

## 2021-10-30 MED ORDER — PREDNISONE 20 MG PO TABS: 20.0000 mg | ORAL_TABLET | Freq: Every day | ORAL | 0 refills | Status: AC

## 2021-10-30 MED ORDER — FAMOTIDINE 20 MG PO TABS: 20.0000 mg | ORAL_TABLET | Freq: Two times a day (BID) | ORAL | 0 refills | Status: DC

## 2021-10-30 MED ORDER — PREDNISONE 20 MG PO TABS
60.0000 mg | ORAL_TABLET | Freq: Once | ORAL | Status: AC
Start: 2021-10-30 — End: 2021-10-30
  Administered 2021-10-30: 60 mg via ORAL
  Filled 2021-10-30: qty 3

## 2021-10-30 MED ORDER — EPINEPHRINE 0.3 MG/0.3ML IJ SOAJ
0.3000 mg | Freq: Once | INTRAMUSCULAR | Status: AC
  Administered 2021-10-30: 0.3 mg via INTRAMUSCULAR
  Filled 2021-10-30: qty 0.3

## 2021-10-30 MED ORDER — EPINEPHRINE 0.3 MG/0.3ML IJ SOAJ: 0.3000 mg | INTRAMUSCULAR | 1 refills | Status: AC | PRN

## 2021-10-30 MED ORDER — OXYCODONE HCL 5 MG PO TABS
10.0000 mg | ORAL_TABLET | Freq: Once | ORAL | Status: AC
  Administered 2021-10-30: 10 mg via ORAL
  Filled 2021-10-30: qty 2

## 2021-10-30 MED ORDER — DIPHENHYDRAMINE HCL 25 MG PO CAPS
50.0000 mg | ORAL_CAPSULE | Freq: Once | ORAL | Status: AC
Start: 2021-10-30 — End: 2021-10-30
  Administered 2021-10-30: 50 mg via ORAL
  Filled 2021-10-30: qty 2

## 2021-10-30 NOTE — ED Triage Notes (Signed)
Pt drank unknown alcohol beverage 30 minutes ago.  Pt has swelling to face and wheezing.  Pt pale.  Pt also reports itching

## 2021-10-30 NOTE — Discharge Instructions (Addendum)
You likely had a severe allergic reaction.  If you have any ongoing hives you can take the Pepcid and Benadryl.  If you have difficulty breathing again or having difficulty swallowing or experience nausea or vomiting, please administer your EpiPen and return to the emergency department immediately.

## 2021-10-30 NOTE — ED Provider Notes (Signed)
[facility] Provider Note    Event Date/Time   First MD Initiated Contact with Patient 10/30/21 1727     (approximate)   History   Allergic Reaction   HPI  [name] is a 56 y.o. female past medical history of bipolar disorder, chronic pain on opiates presents with an allergic reaction.  Patient was at a brewery and had a beer that she is never had before.  Several minutes later she started to feel like her face was having swelling and hives and she was having difficulty breathing.  Patient does have a history of anaphylaxis with NSAIDs and does have an EpiPen but did not have it with her.  Denies nausea vomiting or diarrhea.  No other allergic exposures.    Past Medical History:  Diagnosis Date   Abnormal uterine bleeding    ADHD (attention deficit hyperactivity disorder)    Allergy    Anxiety    Bipolar 1 disorder (HCC)    Bipolar disorder (H[facility])    Cancer ([place])    uterine cancer   DDD (degenerative disc disease)    Depression    HSV infection    Hx: UTI (urinary tract infection)    Pertussis    Seizures ([place])    last seizure 12/2014    Thyroid disease     Patient Active Problem List   Diagnosis Date Noted   Gastroenteritis 03/31/2021   COVID-19 03/31/2021   Protein-calorie malnutrition, severe 12/31/2017   Abdominal pain 12/30/2017   Acute blood loss anemia 12/17/2017   S/P laparoscopic hysterectomy 12/15/2017   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   Hypotension 09/12/2016   Right facial numbness 09/11/2016   Stroke-like symptom 09/11/2016   Seizure ([place]) 09/11/2016   Hypothyroidism 09/11/2016   Stroke-like symptoms 09/11/2016   Fall    Syncope 11/20/2015   Scalp laceration 11/20/2015   Major depressive disorder, recurrent severe without psychotic features ([place]) 03/23/2015   DDD (degenerative disc disease)    ACUTE BRONCHITIS 10/22/2009   GERD 08/02/2009   DEGENERATIVE DISC DISEASE 08/02/2009   COUGH 08/02/2009    Bipolar disorder ([place]) 08/01/2009   Alcoholism ([place]) 08/01/2009   BULIMIA 08/01/2009   HEARING LOSS, BILATERAL 08/01/2009   OSTEOPENIA 08/01/2009   SEIZURE DISORDER 08/01/2009     Physical Exam  Triage Vital Signs: ED Triage Vitals  Enc Vitals Group     BP 10/30/21 1722 (!) 141/100     Pulse Rate 10/30/21 1722 92     Resp 10/30/21 1722 20     Temp 10/30/21 1722 98 F (36.7 C)     Temp Source 10/30/21 1722 Oral     SpO2 10/30/21 1722 100 %     Weight 10/30/21 1721 123 lb (55.8 kg)     Height 10/30/21 1721 5\' 1"  (1.549 m)     Head Circumference --      Peak Flow --      Pain Score 10/30/21 1721 9     Pain Loc --      Pain Edu? --      Excl. in [place]? --     Most recent vital signs: Vitals:   10/30/21 1722 10/30/21 1813  BP: (!) 141/100 (!) 145/87  Pulse: 92 86  Resp: 20 17  Temp: 98 F (36.7 C)   SpO2: 100% 100%     General: Awake, no distress.  CV:  Good peripheral perfusion.  Resp:  Patient is mildly tachypneic, no audible wheezing Abd:  No  distention.  Neuro:             Awake, Alert, Oriented x 3  Other:  Urticaria on the patient's face, lips are mildly swollen, no swelling of the posterior oropharynx, tolerating secretions   ED Results / Procedures / Treatments  Labs (all labs ordered are listed, but only abnormal results are displayed) Labs Reviewed  CBC  BASIC METABOLIC PANEL     EKG     RADIOLOGY    PROCEDURES:  Critical Care performed: Yes, see critical care procedure note(s)  .Critical Care Performed by: Rada Hay, MD Authorized by: Rada Hay, MD   Critical care provider statement:    Critical care time (minutes):  30   Critical care was time spent personally by me on the following activities:  Development of treatment plan with patient or surrogate, discussions with consultants, evaluation of patient's response to treatment, examination of patient, ordering and review of laboratory studies, ordering and review of  radiographic studies, ordering and performing treatments and interventions, pulse oximetry, re-evaluation of patient's condition and review of old charts    MEDICATIONS ORDERED IN ED: Medications  famotidine (PEPCID) tablet 40 mg (40 mg Oral Given 10/30/21 1806)  diphenhydrAMINE (BENADRYL) capsule 50 mg (50 mg Oral Given 10/30/21 1806)  EPINEPHrine (EPI-PEN) injection 0.3 mg (0.3 mg Intramuscular Given 10/30/21 1805)  predniSONE (DELTASONE) tablet 60 mg (60 mg Oral Given 10/30/21 1806)  oxyCODONE (Oxy IR/ROXICODONE) immediate release tablet 10 mg (10 mg Oral Given 10/30/21 1819)     IMPRESSION / MDM / [place] / ED COURSE  I reviewed the triage vital signs and the nursing notes.                              Differential diagnosis includes, but is not limited to, urticaria, anaphylaxis, angioedema  The patient is a 56 year old female presents with acute onset of hives and difficulty breathing after drinking a beer.  On exam she has urticaria of the face and some mild lip swelling.  She has subjective chest tightness and is somewhat tachypneic but no audible wheezing.  She is not had any GI symptoms.  Given the questionable respiratory plus skin symptoms we will treat as anaphylaxis.  She was given epinephrine Benadryl Pepcid and prednisone.  On repeat assessment patient is resting comfortably, her hives are improved and she feels better.  Lip still mildly swollen.  We will continue to observe.  Anticipate discharge.  Signed out to the oncoming provider pending several more hours of observation and likely discharge.  Epinephrine pen and Pepcid and Benadryl sent to the patient's pharmacy.      FINAL CLINICAL IMPRESSION(S) / ED DIAGNOSES   Final diagnoses:  Anaphylaxis, initial encounter     Rx / DC Orders   ED Discharge Orders          Ordered    famotidine (PEPCID) 20 MG tablet  2 times daily        10/30/21 1930    diphenhydrAMINE (BENADRYL ALLERGY) 25 mg capsule  Every 6  hours PRN        10/30/21 1930    EPINEPHrine 0.3 mg/0.3 mL IJ SOAJ injection  As needed        10/30/21 1930             Note:  This document was prepared using Dragon voice recognition software and may include unintentional dictation errors.  Rada Hay, MD 10/30/21 [phone]

## 2022-02-23 ENCOUNTER — Emergency Department
Admission: EM | Admit: 2022-02-23 | Discharge: 2022-02-23 | Disposition: A | Discharge status: Home / Self Care | Payer: Medicare Other | Attending: Emergency Medicine | Admitting: Emergency Medicine

## 2022-02-23 ENCOUNTER — Emergency Department: Payer: Medicare Other

## 2022-02-23 ENCOUNTER — Encounter: Payer: Self-pay | Admitting: *Deleted

## 2022-02-23 ENCOUNTER — Other Ambulatory Visit: Payer: Self-pay

## 2022-02-23 DIAGNOSIS — R110 Nausea: Secondary | ICD-10-CM | POA: Diagnosis not present

## 2022-02-23 DIAGNOSIS — J449 Chronic obstructive pulmonary disease, unspecified: Secondary | ICD-10-CM | POA: Diagnosis not present

## 2022-02-23 DIAGNOSIS — J45909 Unspecified asthma, uncomplicated: Secondary | ICD-10-CM | POA: Insufficient documentation

## 2022-02-23 DIAGNOSIS — Z8542 Personal history of malignant neoplasm of other parts of uterus: Secondary | ICD-10-CM | POA: Insufficient documentation

## 2022-02-23 DIAGNOSIS — J40 Bronchitis, not specified as acute or chronic: Secondary | ICD-10-CM | POA: Diagnosis not present

## 2022-02-23 DIAGNOSIS — R197 Diarrhea, unspecified: Secondary | ICD-10-CM | POA: Diagnosis not present

## 2022-02-23 DIAGNOSIS — Z20822 Contact with and (suspected) exposure to covid-19: Secondary | ICD-10-CM | POA: Insufficient documentation

## 2022-02-23 DIAGNOSIS — R0602 Shortness of breath: Secondary | ICD-10-CM | POA: Diagnosis present

## 2022-02-23 LAB — CBC
HCT: 46.2 % — ABNORMAL HIGH (ref 36.0–46.0)
Hemoglobin: 14.9 g/dL (ref 12.0–15.0)
MCH: 29.9 pg (ref 26.0–34.0)
MCHC: 32.3 g/dL (ref 30.0–36.0)
MCV: 92.6 fL (ref 80.0–100.0)
Platelets: 320 10*3/uL (ref 150–400)
RBC: 4.99 MIL/uL (ref 3.87–5.11)
RDW: 13.3 % (ref 11.5–15.5)
WBC: 9.3 10*3/uL (ref 4.0–10.5)
nRBC: 0 % (ref 0.0–0.2)

## 2022-02-23 LAB — RESP PANEL BY RT-PCR (FLU A&B, COVID) ARPGX2
Influenza A by PCR: NEGATIVE
Influenza B by PCR: NEGATIVE
SARS Coronavirus 2 by RT PCR: NEGATIVE

## 2022-02-23 LAB — BASIC METABOLIC PANEL
Anion gap: 11 (ref 5–15)
BUN: 15 mg/dL (ref 6–20)
CO2: 23 mmol/L (ref 22–32)
Calcium: 9.4 mg/dL (ref 8.9–10.3)
Chloride: 104 mmol/L (ref 98–111)
Creatinine, Ser: 0.9 mg/dL (ref 0.44–1.00)
GFR, Estimated: >60 mL/min (ref >60)
Glucose, Bld: 92 mg/dL (ref 70–99)
Potassium: 3.8 mmol/L (ref 3.5–5.1)
Sodium: 138 mmol/L (ref 135–145)

## 2022-02-23 LAB — TROPONIN I (HIGH SENSITIVITY): Troponin I (High Sensitivity): 4 ng/L (ref <18)

## 2022-02-23 MED ORDER — ONDANSETRON 4 MG PO TBDP: 4.0000 mg | ORAL_TABLET | Freq: Three times a day (TID) | ORAL | 0 refills | Status: AC | PRN

## 2022-02-23 MED ORDER — GABAPENTIN 600 MG PO TABS
300.0000 mg | ORAL_TABLET | ORAL | Status: AC
  Administered 2022-02-23: 300 mg via ORAL
  Filled 2022-02-23: qty 1

## 2022-02-23 MED ORDER — DOXYCYCLINE HYCLATE 100 MG PO CAPS: 100.0000 mg | ORAL_CAPSULE | Freq: Two times a day (BID) | ORAL | 0 refills | Status: AC

## 2022-02-23 MED ORDER — BENZONATATE 100 MG PO CAPS: 100.0000 mg | ORAL_CAPSULE | Freq: Three times a day (TID) | ORAL | 0 refills | Status: AC | PRN

## 2022-02-23 NOTE — ED Triage Notes (Signed)
Pt brought in via ems from home with sob, cough and congestion .  Sx began 4 days ago.  Pt vapes.  Pt reports fever and chills last pm.  Pt alert speech clear ?

## 2022-02-23 NOTE — ED Triage Notes (Signed)
First Nurse Note:  ?Pt via EMS from home. Pt c/o SOB, cough, loss of taste for the past 3 days. Pt is A&Ox4 and NAD.  ? ?92 HR  ?97% on RA ?130/77  ?98.5 orally  ? ?

## 2022-02-23 NOTE — ED Provider Notes (Signed)
? ?[facility] ?Provider Note ? ? ? Event Date/Time  ? First MD Initiated Contact with Patient 02/23/22 1635   ?  (approximate) ? ? ?History  ? ?Shortness of Breath ? ? ?HPI ? ?[name] is a 56 y.o. female with a past medical history of bipolar disorder, seizures, thyroid disease, ADHD, anxiety and vaping without any tobacco use history or history of COPD or asthma who presents for assessment of 3 to 4 days of cough, congestion, chest tightness with coughing, nausea and some diarrhea.  She does not have any chest discomfort when she is not coughing.  No hemoptysis.  No change in her chronic back pain.  No abdominal pain, stool or urinary symptoms.  No rash, earache, sore throat or earache.  She does states she measured a temperature of 100.9 yesterday.  No other acute concerns at this time. ? ?  ?Past Medical History:  ?Diagnosis Date  ? Abnormal uterine bleeding   ? ADHD (attention deficit hyperactivity disorder)   ? Allergy   ? Anxiety   ? Bipolar 1 disorder ([place])   ? Bipolar disorder ([place])   ? Cancer [facility])   ? uterine cancer  ? DDD (degenerative disc disease)   ? Depression   ? HSV infection   ? Hx: UTI (urinary tract infection)   ? Pertussis   ? Seizures ([place])   ? last seizure 12/2014   ? Thyroid disease   ? ? ? ?Physical Exam  ?Triage Vital Signs: ?ED Triage Vitals  ?Enc Vitals Group  ?   BP 02/23/22 1541 121/81  ?   Pulse Rate 02/23/22 1541 98  ?   Resp 02/23/22 1541 20  ?   Temp 02/23/22 1541 98.6 ?F (37 ?C)  ?   Temp Source 02/23/22 1541 Oral  ?   SpO2 02/23/22 1541 99 %  ?   Weight 02/23/22 1538 127 lb (57.6 kg)  ?   Height 02/23/22 1538 '5\' 1"'$  (1.549 m)  ?   Head Circumference --   ?   Peak Flow --   ?   Pain Score 02/23/22 1538 7  ?   Pain Loc --   ?   Pain Edu? --   ?   Excl. in [place]? --   ? ? ?Most recent vital signs: ?Vitals:  ? 02/23/22 1541  ?BP: 121/81  ?Pulse: 98  ?Resp: 20  ?Temp: 98.6 ?F (37 ?C)  ?SpO2: 99%  ? ? ?General: Awake, no distress.  ?CV:  Good peripheral  perfusion.  2+ radial pulse. ?Resp:  Normal effort.  Clear bilaterally. ?Abd:  No distention.  Soft. ?Other:  Oropharynx is unremarkable.  2+ radial pulses.  No lower extremity edema or calf tenderness. ? ? ?ED Results / Procedures / Treatments  ?Labs ?(all labs ordered are listed, but only abnormal results are displayed) ?Labs Reviewed  ?CBC - Abnormal; Notable for the following components:  ?    Result Value  ? HCT 46.2 (*)   ? All other components within normal limits  ?RESP PANEL BY RT-PCR (FLU A&B, COVID) ARPGX2  ?BASIC METABOLIC PANEL  ?TROPONIN I (HIGH SENSITIVITY)  ? ? ? ?EKG ? ?ECG is remarkable sinus rhythm with a ventricular rate of 93, QTc interval 47, normal axis, otherwise no evidence of ischemia or significant arrhythmia. ? ? ?RADIOLOGY ? ? ?Chest x-ray my review without evidence of focal consolidation, effusion, edema, pneumothorax or other clear acute thoracic process.  There is evidence of some  hyperinflation and agree with radiology interpretation this could reflect chronic vaping. ? ?PROCEDURES: ? ?Critical Care performed: No ? ?Procedures ? ? ? ?MEDICATIONS ORDERED IN ED: ?Medications  ?gabapentin (NEURONTIN) tablet 300 mg (has no administration in time range)  ? ? ? ?IMPRESSION / MDM / ASSESSMENT AND PLAN / ED COURSE  ?I reviewed the triage vital signs and the nursing notes. ?             ?               ? ?Differential diagnosis includes, but is not limited to bronchitis, pneumonia, CHF, ACS, arrhythmia, anemia, metabolic derangements, symptomatic pleural effusion with lower suspicion based on absence of any.  Tensive chest pain or shortness of breath and other symptoms more clearly suggestive of a acute infectious process for PE or dissection. ? ?ECG is remarkable sinus rhythm with a ventricular rate of 93, QTc interval 47, normal axis, otherwise no evidence of ischemia or significant arrhythmia.  Nonelevated troponin not suggestive of ACS or myocarditis. ? ?CBC without leukocytosis or acute  anemia.  BMP without any significant lecture metabolic derangements ? ?Chest x-ray my review without evidence of focal consolidation, effusion, edema, pneumothorax or other clear acute thoracic process.  There is evidence of some hyperinflation and agree with radiology interpretation this could reflect chronic vaping. ? ?Suspect likely viral bronchitis, given patient reporting fevers and does have history of vaping will cover for possible atypical pneumonia with a course of doxycycline.  Suspect there may be a component of gastroenteritis as well given patient reports some nausea and diarrhea although she has no abdominal tenderness leukocytosis abnormal vitals or other findings to suggest other acute abdominal process such as appendicitis, diverticulitis she does not otherwise appear septic.  I will also write for a short course of Tessalon Perles and some Zofran.  Discussed importance of adequate hydration and vaping cessation.  Discussed returning for any new or worsening of symptoms.  Discharged in stable condition.  Strict return precautions advised and discussed.  1 dose of home gabapentin given.  COVID influenza PCR obtained which are patient follow-up online outpatient. ? ?  ? ? ?FINAL CLINICAL IMPRESSION(S) / ED DIAGNOSES  ? ?Final diagnoses:  ?Bronchitis  ?Diarrhea of presumed infectious origin  ?Nausea  ? ? ? ?Rx / DC Orders  ? ?ED Discharge Orders   ? ?      Ordered  ?  doxycycline (VIBRAMYCIN) 100 MG capsule  2 times daily       ? 02/23/22 1659  ?  ondansetron (ZOFRAN-ODT) 4 MG disintegrating tablet  Every 8 hours PRN       ? 02/23/22 1659  ?  benzonatate (TESSALON PERLES) 100 MG capsule  3 times daily PRN       ? 02/23/22 1659  ? ?  ?  ? ?  ? ? ? ?Note:  This document was prepared using Dragon voice recognition software and may include unintentional dictation errors. ?  ?Lucrezia Starch, MD ?02/23/22 1700 ? ?

## 2022-07-08 ENCOUNTER — Inpatient Hospital Stay: Payer: Medicare Other

## 2022-07-08 ENCOUNTER — Emergency Department: Payer: Medicare Other

## 2022-07-08 ENCOUNTER — Inpatient Hospital Stay
Admission: EM | Admit: 2022-07-08 | Discharge: 2022-07-13 | DRG: 872 | Disposition: A | Discharge status: Home / Self Care | Payer: Medicare Other | Attending: Obstetrics and Gynecology | Admitting: Obstetrics and Gynecology

## 2022-07-08 ENCOUNTER — Other Ambulatory Visit: Payer: Self-pay

## 2022-07-08 DIAGNOSIS — F909 Attention-deficit hyperactivity disorder, unspecified type: Secondary | ICD-10-CM | POA: Diagnosis present

## 2022-07-08 DIAGNOSIS — M545 Low back pain, unspecified: Secondary | ICD-10-CM | POA: Diagnosis not present

## 2022-07-08 DIAGNOSIS — Z8542 Personal history of malignant neoplasm of other parts of uterus: Secondary | ICD-10-CM | POA: Diagnosis not present

## 2022-07-08 DIAGNOSIS — M79604 Pain in right leg: Secondary | ICD-10-CM

## 2022-07-08 DIAGNOSIS — Z90710 Acquired absence of both cervix and uterus: Secondary | ICD-10-CM

## 2022-07-08 DIAGNOSIS — F1990 Other psychoactive substance use, unspecified, uncomplicated: Secondary | ICD-10-CM

## 2022-07-08 DIAGNOSIS — Z9851 Tubal ligation status: Secondary | ICD-10-CM | POA: Diagnosis not present

## 2022-07-08 DIAGNOSIS — M549 Dorsalgia, unspecified: Secondary | ICD-10-CM | POA: Diagnosis present

## 2022-07-08 DIAGNOSIS — N179 Acute kidney failure, unspecified: Secondary | ICD-10-CM | POA: Diagnosis present

## 2022-07-08 DIAGNOSIS — R0602 Shortness of breath: Secondary | ICD-10-CM

## 2022-07-08 DIAGNOSIS — F419 Anxiety disorder, unspecified: Secondary | ICD-10-CM | POA: Diagnosis present

## 2022-07-08 DIAGNOSIS — Z888 Allergy status to other drugs, medicaments and biological substances status: Secondary | ICD-10-CM | POA: Diagnosis not present

## 2022-07-08 DIAGNOSIS — Z79899 Other long term (current) drug therapy: Secondary | ICD-10-CM

## 2022-07-08 DIAGNOSIS — R6520 Severe sepsis without septic shock: Secondary | ICD-10-CM | POA: Diagnosis not present

## 2022-07-08 DIAGNOSIS — M2559 Pain in other specified joint: Secondary | ICD-10-CM | POA: Diagnosis present

## 2022-07-08 DIAGNOSIS — I351 Nonrheumatic aortic (valve) insufficiency: Secondary | ICD-10-CM | POA: Diagnosis not present

## 2022-07-08 DIAGNOSIS — Z20822 Contact with and (suspected) exposure to covid-19: Secondary | ICD-10-CM | POA: Diagnosis present

## 2022-07-08 DIAGNOSIS — E860 Dehydration: Secondary | ICD-10-CM | POA: Diagnosis present

## 2022-07-08 DIAGNOSIS — R7881 Bacteremia: Secondary | ICD-10-CM

## 2022-07-08 DIAGNOSIS — F319 Bipolar disorder, unspecified: Secondary | ICD-10-CM | POA: Diagnosis present

## 2022-07-08 DIAGNOSIS — E872 Acidosis, unspecified: Secondary | ICD-10-CM | POA: Diagnosis present

## 2022-07-08 DIAGNOSIS — I361 Nonrheumatic tricuspid (valve) insufficiency: Secondary | ICD-10-CM | POA: Diagnosis not present

## 2022-07-08 DIAGNOSIS — F1120 Opioid dependence, uncomplicated: Secondary | ICD-10-CM

## 2022-07-08 DIAGNOSIS — A419 Sepsis, unspecified organism: Secondary | ICD-10-CM | POA: Diagnosis present

## 2022-07-08 DIAGNOSIS — Z885 Allergy status to narcotic agent status: Secondary | ICD-10-CM

## 2022-07-08 DIAGNOSIS — G8929 Other chronic pain: Secondary | ICD-10-CM | POA: Diagnosis present

## 2022-07-08 DIAGNOSIS — Z8049 Family history of malignant neoplasm of other genital organs: Secondary | ICD-10-CM

## 2022-07-08 DIAGNOSIS — E079 Disorder of thyroid, unspecified: Secondary | ICD-10-CM | POA: Diagnosis present

## 2022-07-08 DIAGNOSIS — B9689 Other specified bacterial agents as the cause of diseases classified elsewhere: Secondary | ICD-10-CM | POA: Diagnosis present

## 2022-07-08 DIAGNOSIS — M79605 Pain in left leg: Secondary | ICD-10-CM

## 2022-07-08 DIAGNOSIS — K219 Gastro-esophageal reflux disease without esophagitis: Secondary | ICD-10-CM | POA: Diagnosis present

## 2022-07-08 DIAGNOSIS — R509 Fever, unspecified: Secondary | ICD-10-CM | POA: Insufficient documentation

## 2022-07-08 DIAGNOSIS — F1020 Alcohol dependence, uncomplicated: Secondary | ICD-10-CM | POA: Diagnosis present

## 2022-07-08 DIAGNOSIS — I340 Nonrheumatic mitral (valve) insufficiency: Secondary | ICD-10-CM | POA: Diagnosis not present

## 2022-07-08 DIAGNOSIS — R21 Rash and other nonspecific skin eruption: Secondary | ICD-10-CM | POA: Diagnosis present

## 2022-07-08 LAB — URINALYSIS, COMPLETE (UACMP) WITH MICROSCOPIC
Bilirubin Urine: NEGATIVE
Glucose, UA: NEGATIVE mg/dL
Ketones, ur: NEGATIVE mg/dL
Leukocytes,Ua: NEGATIVE
Nitrite: NEGATIVE
Protein, ur: 100 mg/dL — AB
Specific Gravity, Urine: 1.019 (ref 1.005–1.030)
pH: 5 (ref 5.0–8.0)

## 2022-07-08 LAB — CBC WITH DIFFERENTIAL/PLATELET
Abs Immature Granulocytes: 0.03 10*3/uL (ref 0.00–0.07)
Basophils Absolute: 0 10*3/uL (ref 0.0–0.1)
Basophils Relative: 1 %
Eosinophils Absolute: 0.1 10*3/uL (ref 0.0–0.5)
Eosinophils Relative: 3 %
HCT: 33.7 % — ABNORMAL LOW (ref 36.0–46.0)
Hemoglobin: 11 g/dL — ABNORMAL LOW (ref 12.0–15.0)
Immature Granulocytes: 1 %
Lymphocytes Relative: 12 %
Lymphs Abs: 0.7 10*3/uL (ref 0.7–4.0)
MCH: 29.1 pg (ref 26.0–34.0)
MCHC: 32.6 g/dL (ref 30.0–36.0)
MCV: 89.2 fL (ref 80.0–100.0)
Monocytes Absolute: 0 10*3/uL — ABNORMAL LOW (ref 0.1–1.0)
Monocytes Relative: 1 %
Neutro Abs: 4.4 10*3/uL (ref 1.7–7.7)
Neutrophils Relative %: 82 %
Platelets: 134 10*3/uL — ABNORMAL LOW (ref 150–400)
RBC: 3.78 MIL/uL — ABNORMAL LOW (ref 3.87–5.11)
RDW: 14.7 % (ref 11.5–15.5)
Smear Review: NORMAL
WBC: 5.3 10*3/uL (ref 4.0–10.5)
nRBC: 0 % (ref 0.0–0.2)

## 2022-07-08 LAB — URINE DRUG SCREEN, QUALITATIVE (ARMC ONLY)
Amphetamines, Ur Screen: NOT DETECTED
Barbiturates, Ur Screen: NOT DETECTED
Benzodiazepine, Ur Scrn: NOT DETECTED
Cannabinoid 50 Ng, Ur ~~LOC~~: NOT DETECTED
Cocaine Metabolite,Ur ~~LOC~~: NOT DETECTED
MDMA (Ecstasy)Ur Screen: NOT DETECTED
Methadone Scn, Ur: NOT DETECTED
Opiate, Ur Screen: POSITIVE — AB
Phencyclidine (PCP) Ur S: NOT DETECTED
Tricyclic, Ur Screen: NOT DETECTED

## 2022-07-08 LAB — RESP PANEL BY RT-PCR (FLU A&B, COVID) ARPGX2
Influenza A by PCR: NEGATIVE
Influenza B by PCR: NEGATIVE
SARS Coronavirus 2 by RT PCR: NEGATIVE

## 2022-07-08 LAB — RESPIRATORY PANEL BY PCR

## 2022-07-08 LAB — C-REACTIVE PROTEIN: CRP: 14 mg/dL — ABNORMAL HIGH (ref <1.0)

## 2022-07-08 LAB — COMPREHENSIVE METABOLIC PANEL
ALT: 32 U/L (ref 0–44)
AST: 63 U/L — ABNORMAL HIGH (ref 15–41)
Albumin: 3.2 g/dL — ABNORMAL LOW (ref 3.5–5.0)
Alkaline Phosphatase: 172 U/L — ABNORMAL HIGH (ref 38–126)
Anion gap: 9 (ref 5–15)
BUN: 19 mg/dL (ref 6–20)
CO2: 22 mmol/L (ref 22–32)
Calcium: 9 mg/dL (ref 8.9–10.3)
Chloride: 109 mmol/L (ref 98–111)
Creatinine, Ser: 1.3 mg/dL — ABNORMAL HIGH (ref 0.44–1.00)
GFR, Estimated: 48 mL/min — ABNORMAL LOW (ref >60)
Glucose, Bld: 99 mg/dL (ref 70–99)
Potassium: 3.7 mmol/L (ref 3.5–5.1)
Sodium: 140 mmol/L (ref 135–145)
Total Bilirubin: 0.6 mg/dL (ref 0.3–1.2)
Total Protein: 6.4 g/dL — ABNORMAL LOW (ref 6.5–8.1)

## 2022-07-08 LAB — PROTIME-INR
INR: 0.9 (ref 0.8–1.2)
Prothrombin Time: 12.3 seconds (ref 11.4–15.2)

## 2022-07-08 LAB — APTT: aPTT: 28 seconds (ref 24–36)

## 2022-07-08 LAB — SEDIMENTATION RATE: Sed Rate: 68 mm/hr — ABNORMAL HIGH (ref 0–30)

## 2022-07-08 LAB — LACTIC ACID, PLASMA
Lactic Acid, Venous: 2.8 mmol/L (ref 0.5–1.9)
Lactic Acid, Venous: 1.4 mmol/L (ref 0.5–1.9)

## 2022-07-08 MED ORDER — GADOBUTROL 1 MMOL/ML IV SOLN
6.0000 mL | Freq: Once | INTRAVENOUS | Status: AC | PRN
  Administered 2022-07-08: 6 mL via INTRAVENOUS

## 2022-07-08 MED ORDER — ONDANSETRON HCL 4 MG/2ML IJ SOLN: 4.0000 mg | Freq: Four times a day (QID) | INTRAMUSCULAR | Status: DC | PRN

## 2022-07-08 MED ORDER — VANCOMYCIN HCL IN DEXTROSE 1-5 GM/200ML-% IV SOLN
1000.0000 mg | Freq: Once | INTRAVENOUS | Status: AC
  Administered 2022-07-08: 1000 mg via INTRAVENOUS
  Filled 2022-07-08: qty 200

## 2022-07-08 MED ORDER — LORAZEPAM 2 MG PO TABS: 0.0000 mg | ORAL_TABLET | Freq: Two times a day (BID) | ORAL | Status: DC

## 2022-07-08 MED ORDER — ADULT MULTIVITAMIN W/MINERALS CH
1.0000 | ORAL_TABLET | Freq: Every day | ORAL | Status: DC
  Administered 2022-07-08 – 2022-07-13 (×6): 1 via ORAL
  Filled 2022-07-08 (×6): qty 1

## 2022-07-08 MED ORDER — GABAPENTIN 300 MG PO CAPS
300.0000 mg | ORAL_CAPSULE | Freq: Three times a day (TID) | ORAL | Status: DC
  Administered 2022-07-08 – 2022-07-13 (×16): 300 mg via ORAL
  Filled 2022-07-08 (×16): qty 1

## 2022-07-08 MED ORDER — ACETAMINOPHEN 500 MG PO TABS: 500.0000 mg | ORAL_TABLET | Freq: Four times a day (QID) | ORAL | Status: DC | PRN

## 2022-07-08 MED ORDER — METRONIDAZOLE 500 MG/100ML IV SOLN
500.0000 mg | Freq: Two times a day (BID) | INTRAVENOUS | Status: DC
  Administered 2022-07-08: 500 mg via INTRAVENOUS
  Filled 2022-07-08 (×2): qty 100

## 2022-07-08 MED ORDER — LORAZEPAM 2 MG/ML IJ SOLN: 1.0000 mg | INTRAMUSCULAR | Status: AC | PRN

## 2022-07-08 MED ORDER — THIAMINE HCL 100 MG/ML IJ SOLN: 100.0000 mg | Freq: Every day | INTRAMUSCULAR | Status: DC

## 2022-07-08 MED ORDER — IOHEXOL 350 MG/ML SOLN
50.0000 mL | Freq: Once | INTRAVENOUS | Status: AC | PRN
  Administered 2022-07-08: 50 mL via INTRAVENOUS

## 2022-07-08 MED ORDER — CYCLOBENZAPRINE HCL 10 MG PO TABS
10.0000 mg | ORAL_TABLET | Freq: Three times a day (TID) | ORAL | Status: DC | PRN
  Administered 2022-07-08 – 2022-07-12 (×9): 10 mg via ORAL
  Filled 2022-07-08 (×9): qty 1

## 2022-07-08 MED ORDER — AMPHETAMINE-DEXTROAMPHETAMINE 10 MG PO TABS
10.0000 mg | ORAL_TABLET | Freq: Two times a day (BID) | ORAL | Status: DC
  Administered 2022-07-09 – 2022-07-13 (×9): 10 mg via ORAL
  Filled 2022-07-08 (×9): qty 1

## 2022-07-08 MED ORDER — LACTATED RINGERS IV BOLUS (SEPSIS)
1000.0000 mL | Freq: Once | INTRAVENOUS | Status: AC
  Administered 2022-07-08: 1000 mL via INTRAVENOUS

## 2022-07-08 MED ORDER — OXYCODONE HCL 5 MG PO TABS
10.0000 mg | ORAL_TABLET | Freq: Four times a day (QID) | ORAL | Status: DC | PRN
  Administered 2022-07-08 – 2022-07-13 (×17): 10 mg via ORAL
  Filled 2022-07-08 (×17): qty 2

## 2022-07-08 MED ORDER — SODIUM CHLORIDE 0.9 % IV SOLN
2.0000 g | Freq: Two times a day (BID) | INTRAVENOUS | Status: DC
  Administered 2022-07-08 – 2022-07-11 (×6): 2 g via INTRAVENOUS
  Filled 2022-07-08 (×5): qty 12.5
  Filled 2022-07-08: qty 2
  Filled 2022-07-08: qty 12.5

## 2022-07-08 MED ORDER — SODIUM CHLORIDE 0.9 % IV SOLN
2.0000 g | Freq: Once | INTRAVENOUS | Status: AC
  Administered 2022-07-08: 2 g via INTRAVENOUS
  Filled 2022-07-08: qty 12.5

## 2022-07-08 MED ORDER — VANCOMYCIN VARIABLE DOSE PER UNSTABLE RENAL FUNCTION (PHARMACIST DOSING): Status: DC

## 2022-07-08 MED ORDER — CARIPRAZINE HCL 1.5 MG PO CAPS
4.5000 mg | ORAL_CAPSULE | Freq: Every day | ORAL | Status: DC
  Filled 2022-07-08 (×6): qty 3

## 2022-07-08 MED ORDER — ONDANSETRON HCL 4 MG PO TABS: 4.0000 mg | ORAL_TABLET | Freq: Four times a day (QID) | ORAL | Status: DC | PRN

## 2022-07-08 MED ORDER — LORAZEPAM 1 MG PO TABS: 1.0000 mg | ORAL_TABLET | ORAL | Status: AC | PRN

## 2022-07-08 MED ORDER — THIAMINE MONONITRATE 100 MG PO TABS
100.0000 mg | ORAL_TABLET | Freq: Every day | ORAL | Status: DC
  Administered 2022-07-08 – 2022-07-13 (×6): 100 mg via ORAL
  Filled 2022-07-08 (×6): qty 1

## 2022-07-08 MED ORDER — INFLUENZA VAC SPLIT QUAD 0.5 ML IM SUSY: 0.5000 mL | PREFILLED_SYRINGE | INTRAMUSCULAR | Status: DC

## 2022-07-08 MED ORDER — ACETAMINOPHEN 325 MG PO TABS
650.0000 mg | ORAL_TABLET | Freq: Once | ORAL | Status: AC
  Administered 2022-07-08: 650 mg via ORAL
  Filled 2022-07-08: qty 2

## 2022-07-08 MED ORDER — ENOXAPARIN SODIUM 40 MG/0.4ML IJ SOSY
40.0000 mg | PREFILLED_SYRINGE | INTRAMUSCULAR | Status: DC
  Administered 2022-07-08: 40 mg via SUBCUTANEOUS
  Filled 2022-07-08: qty 0.4

## 2022-07-08 MED ORDER — VANCOMYCIN HCL 500 MG/100ML IV SOLN
500.0000 mg | Freq: Once | INTRAVENOUS | Status: AC
  Administered 2022-07-08: 500 mg via INTRAVENOUS
  Filled 2022-07-08: qty 100

## 2022-07-08 MED ORDER — LORAZEPAM 2 MG PO TABS: 0.0000 mg | ORAL_TABLET | Freq: Four times a day (QID) | ORAL | Status: DC

## 2022-07-08 MED ORDER — DULOXETINE HCL 30 MG PO CPEP
30.0000 mg | ORAL_CAPSULE | Freq: Two times a day (BID) | ORAL | Status: DC
  Administered 2022-07-08 – 2022-07-13 (×10): 30 mg via ORAL
  Filled 2022-07-08 (×10): qty 1

## 2022-07-08 MED ORDER — FOLIC ACID 1 MG PO TABS
1.0000 mg | ORAL_TABLET | Freq: Every day | ORAL | Status: DC
  Administered 2022-07-08 – 2022-07-13 (×6): 1 mg via ORAL
  Filled 2022-07-08 (×6): qty 1

## 2022-07-08 MED ORDER — METRONIDAZOLE 500 MG/100ML IV SOLN
500.0000 mg | Freq: Once | INTRAVENOUS | Status: AC
  Administered 2022-07-08: 500 mg via INTRAVENOUS
  Filled 2022-07-08: qty 100

## 2022-07-08 MED ORDER — PNEUMOCOCCAL 20-VAL CONJ VACC 0.5 ML IM SUSY
0.5000 mL | PREFILLED_SYRINGE | INTRAMUSCULAR | Status: DC
  Filled 2022-07-08: qty 0.5

## 2022-07-08 MED ORDER — FAMOTIDINE 20 MG PO TABS
20.0000 mg | ORAL_TABLET | Freq: Two times a day (BID) | ORAL | Status: DC
  Administered 2022-07-08 – 2022-07-13 (×10): 20 mg via ORAL
  Filled 2022-07-08 (×10): qty 1

## 2022-07-08 NOTE — Sepsis Progress Note (Signed)
eLink monitoring code sepsis.  

## 2022-07-08 NOTE — Assessment & Plan Note (Addendum)
Patient with a history of alcoholism but denies recent ingestion and having symptoms of alcohol withdrawal when she does not drink\ Labs show AST >> ALT We will place patient on CIWA protocol and administer lorazepam for CIWA score of 8 or greater

## 2022-07-08 NOTE — Assessment & Plan Note (Signed)
Patient presents for evaluation of a fever with a Tmax of 103 and was tachycardic and tachypneic. Lactic acid was elevated at 2.8 and shows a downward trend No obvious source of sepsis at this time and no leukocytosis Continue empiric antibiotic therapy with vancomycin, cefepime and Flagyl and de-escalate once culture results become available Follow-up results of MRI of the lumbar spine as well as CT angiogram of the chest

## 2022-07-08 NOTE — ED Provider Notes (Signed)
[facility] Provider Note    Event Date/Time   First MD Initiated Contact with Patient 07/08/22 0919     (approximate)   History   Shortness of Breath and Fever   HPI  [name] is a 56 y.o. female here with shortness of breath, fever, confusion.  The patient states that over the last several months, she has had intermittent episodes in which she feels acute onset of chills, confusion, and generalized fatigue.  She occasionally gets confused with this.  She had an episode several days ago in which she actually forgot where she was going and had to pull off the side of the road.  She felt chills, cough, fevers, and chills.  Since then, she has felt generally weak.  She had increasing confusion.  She has had decreased appetite.  She had intermittent worsening lower back pain and during these episodes, she states that she gets swelling of her bilateral feet intermittently.  She also has a rash on her face which has been intermittent.  Denies any other complaints.  Of note, she does have a son who had a fever yesterday.     Physical Exam   Triage Vital Signs: ED Triage Vitals  Enc Vitals Group     BP 07/08/22 0911 113/64     Pulse Rate 07/08/22 0911 (!) 116     Resp 07/08/22 0911 18     Temp 07/08/22 0911 (!) 103.2 F (39.6 C)     Temp Source 07/08/22 0911 Oral     SpO2 07/08/22 0911 95 %     Weight --      Height --      Head Circumference --      Peak Flow --      Pain Score 07/08/22 0913 7     Pain Loc --      Pain Edu? --      Excl. in [place]? --     Most recent vital signs: Vitals:   07/08/22 1117 07/08/22 1119  BP:  105/62  Pulse: 100 95  Resp: 16 (!) 24  Temp: 98.9 F (37.2 C)   SpO2:  94%     General: Awake, no distress.  CV:  Good peripheral perfusion.  Tachycardic.  Systolic murmur appreciated. Resp:  Is clear to auscultation bilaterally.  Normal effort. Abd:  No distention.  No tenderness. Other:  Scattered superficial  excoriations/ulcerations to the face and bilateral upper and lower extremities.  Markedly poor dentition.  Dry mucous membranes.   ED Results / Procedures / Treatments   Labs (all labs ordered are listed, but only abnormal results are displayed) Labs Reviewed  LACTIC ACID, PLASMA - Abnormal; Notable for the following components:      Result Value   Lactic Acid, Venous 2.8 (*)    All other components within normal limits  COMPREHENSIVE METABOLIC PANEL - Abnormal; Notable for the following components:   Creatinine, Ser 1.30 (*)    Total Protein 6.4 (*)    Albumin 3.2 (*)    AST 63 (*)    Alkaline Phosphatase 172 (*)    GFR, Estimated 48 (*)    All other components within normal limits  CBC WITH DIFFERENTIAL/PLATELET - Abnormal; Notable for the following components:   RBC 3.78 (*)    Hemoglobin 11.0 (*)    HCT 33.7 (*)    Platelets 134 (*)    Monocytes Absolute 0.0 (*)    All other components within normal  limits  URINALYSIS, COMPLETE (UACMP) WITH MICROSCOPIC - Abnormal; Notable for the following components:   Color, Urine YELLOW (*)    APPearance HAZY (*)    Hgb urine dipstick SMALL (*)    Protein, ur 100 (*)    Bacteria, UA RARE (*)    All other components within normal limits  SEDIMENTATION RATE - Abnormal; Notable for the following components:   Sed Rate 68 (*)    All other components within normal limits  RESP PANEL BY RT-PCR (FLU A&B, COVID) ARPGX2  CULTURE, BLOOD (ROUTINE X 2)  CULTURE, BLOOD (ROUTINE X 2)  URINE CULTURE  PROTIME-INR  APTT  LACTIC ACID, PLASMA  C-REACTIVE PROTEIN  URINE DRUG SCREEN, QUALITATIVE ([facility] ONLY)  POC URINE PREG, ED     EKG Sinus tachycardia, ventricular 117.  PR 139, QRS 87, QTc 493.  No acute ST elevations or depressions.  No EKG evidence of acute ischemia or infarct.   RADIOLOGY CT head: No acute abnormality Chest x-ray: Clear   I also independently reviewed and agree with radiologist  interpretations.   PROCEDURES:  Critical Care performed: Yes, see critical care procedure note(s)  .Critical Care  Performed by: Duffy Bruce, MD Authorized by: Duffy Bruce, MD   Critical care provider statement:    Critical care time (minutes):  30   Critical care time was exclusive of:  Separately billable procedures and treating other patients   Critical care was necessary to treat or prevent imminent or life-threatening deterioration of the following conditions:  Cardiac failure, circulatory failure, respiratory failure and sepsis   Critical care was time spent personally by me on the following activities:  Development of treatment plan with patient or surrogate, discussions with consultants, evaluation of patient's response to treatment, examination of patient, ordering and review of laboratory studies, ordering and review of radiographic studies, ordering and performing treatments and interventions, pulse oximetry, re-evaluation of patient's condition and review of old charts     MEDICATIONS ORDERED IN ED: Medications  vancomycin (VANCOCIN) IVPB 1000 mg/200 mL premix (1,000 mg Intravenous New Bag/Given 07/08/22 1121)  lactated ringers bolus 1,000 mL (1,000 mLs Intravenous Bolus 07/08/22 0924)    And  lactated ringers bolus 1,000 mL (0 mLs Intravenous Stopped 07/08/22 1002)  ceFEPIme (MAXIPIME) 2 g in sodium chloride 0.9 % 100 mL IVPB (0 g Intravenous Stopped 07/08/22 1002)  metroNIDAZOLE (FLAGYL) IVPB 500 mg (0 mg Intravenous Stopped 07/08/22 1103)  acetaminophen (TYLENOL) tablet 650 mg (650 mg Oral Given 07/08/22 0947)     IMPRESSION / MDM / [place] / ED COURSE  I reviewed the triage vital signs and the nursing notes.                               The patient is on the cardiac monitor to evaluate for evidence of arrhythmia and/or significant heart rate changes.   Ddx:  Differential includes the following, with pertinent life- or limb-threatening  emergencies considered:  Sepsis secondary to pneumonia, UTI, possibly endocarditis, possibly osteomyelitis given her back pain, query question of IV drug use or methamphetamine abuse based on her history and presentation, COVID-19  Patient's presentation is most consistent with acute presentation with potential threat to life or bodily function.  MDM:  56 year old female with past medical history of bipolar disorder, degenerative disc disease, chronic pain on opiates, here with multiple complaints.  The patient is febrile, tachycardic, appears moderately unwell on exam.  She  has apparently been having intermittent episodes of fever and chills with confusion for the last several months.  Denies drug use but has had a history of recurrent marks along veins on her arms as well as skin picking with positive drug screens.  She does tell me that she used IV drugs once in the past.  CBC shows no leukocytosis.  She does, however, have a lactic acidosis of 2.8.  Creatinine slightly elevated likely due to her dehydration.  Urinalysis negative.  Source of infection unclear at this time although clinically have a high concern for endocarditis as she also has a significant murmur on exam.  Differential would also include possible cervical or particularly lumbar osteomyelitis or epidural abscess in the setting of these intermittent fevers with worsening back pain.  She has a mild headache but this began after she had fallen, and I do not suspect meningitis clinically.  CT head shows no acute abnormality on my review.  We will plan to admit to medicine.  Broad-spectrum IV antibiotics as well as 30 cc/kg fluids given.  She has no hypotension or signs of shock at this time.   MEDICATIONS GIVEN IN ED: Medications  vancomycin (VANCOCIN) IVPB 1000 mg/200 mL premix (1,000 mg Intravenous New Bag/Given 07/08/22 1121)  lactated ringers bolus 1,000 mL (1,000 mLs Intravenous Bolus 07/08/22 0924)    And  lactated ringers bolus  1,000 mL (0 mLs Intravenous Stopped 07/08/22 1002)  ceFEPIme (MAXIPIME) 2 g in sodium chloride 0.9 % 100 mL IVPB (0 g Intravenous Stopped 07/08/22 1002)  metroNIDAZOLE (FLAGYL) IVPB 500 mg (0 mg Intravenous Stopped 07/08/22 1103)  acetaminophen (TYLENOL) tablet 650 mg (650 mg Oral Given 07/08/22 0947)     Consults:  Hospitalist   EMR reviewed  Reviewed prior records, which do note history of previous track marks noted on the arms for which she states that her daughter practice phlebotomy, as well as skin picking with recurrent wounds on her arms and face.     FINAL CLINICAL IMPRESSION(S) / ED DIAGNOSES   Final diagnoses:  Sepsis without acute organ dysfunction, due to unspecified organism ([place])  Fever, unspecified fever cause  Lactic acidosis     Rx / DC Orders   ED Discharge Orders     None        Note:  This document was prepared using Dragon voice recognition software and may include unintentional dictation errors.   Duffy Bruce, MD 07/08/22 1140

## 2022-07-08 NOTE — Consult Note (Addendum)
Pharmacy Antibiotic Note  [name] is a 56 y.o. female admitted on 07/08/2022 with sepsis. Patient is febrile (tmax: 103F) with elevated lactic acid of 2.8. WBC WNL. Source unknown but concern for possible osteomyelitis or abscess. Pharmacy has been consulted for vancomycin and cefepime dosing.  Plan: Continue cefepime 2g q12H due to renal function.   Pt received vancomycin 1g x 1 in the ED. Will give vancomycin 500 mg x 1 for a total 1500 mg loading dose. Pt currently in AKI. Will monitor Scr and order a 24 hours random vancomycin level. Plan to dose by levels until Scr stabilize.   Continue flagyl 500 mg q12H    Weight: 57.6 kg (126 lb 15.8 oz) (02/2022)  Temp (24hrs), Avg:101.1 F (38.4 C), Min:98.9 F (37.2 C), Max:103.2 F (39.6 C)  Recent Labs  Lab 07/08/22 0915 07/08/22 1125  WBC 5.3  --   CREATININE 1.30*  --   LATICACIDVEN 2.8* 1.4    Estimated Creatinine Clearance: 39 mL/min (A) (by C-G formula based on SCr of 1.3 mg/dL (H)).    Allergies  Allergen Reactions   Naproxen Anaphylaxis   Morphine And Related Itching    Antimicrobials this admission: 9/13 vancomycin  >>  9/13 cefepime >>  9/13 flagyl >>  Dose adjustments this admission: none  Microbiology results: 9/13 BCx: in progress 9/13 UCx: in progress   Thank you for allowing pharmacy to be a part of this patient's care.  Brenton Student  07/08/2022 3:26 PM

## 2022-07-08 NOTE — ED Notes (Signed)
Informed RN bed assigned 

## 2022-07-08 NOTE — Assessment & Plan Note (Signed)
Continue Pepcid  

## 2022-07-08 NOTE — Assessment & Plan Note (Signed)
Patient presents for evaluation of worsening lower back pain with associated fever Concern for possible osteomyelitis or abscess Follow-up results of MRI of lumbar spine Continue empiric antibiotic therapy with vancomycin and cefepime

## 2022-07-08 NOTE — Progress Notes (Addendum)
CODE SEPSIS - PHARMACY COMMUNICATION  **Broad Spectrum Antibiotics should be administered within 1 hour of Sepsis diagnosis**  Time Code Sepsis Called/Page Received: 0920  Antibiotics Ordered: Cefepime, Vancomycin, Flagyl  Time of 1st antibiotic administration: 0925  Additional action taken by pharmacy: N/A  Gretel Acre, PharmD PGY1 Pharmacy Resident 07/08/2022 9:28 AM

## 2022-07-08 NOTE — H&P (Addendum)
History and Physical    Patient: [name] QMG:[id number] DOB: [birth year] DOA: 07/08/2022 DOS: the patient was seen and examined on 07/08/2022 PCP: Sandi Mariscal, MD  Patient coming from: Home  Chief Complaint:  Chief Complaint  Patient presents with   Shortness of Breath   Fever    Patient is a poor historian.  Most of the history is obtained from EMR HPI: [name] is a 56 y.o. female with medical history significant for Bipolar disorder, anxiety, depression, thyroid disease who presents to the ER for evaluation of fever and shortness of breath. Patient states that she has had symptoms intermittently over months which she describes as fever, chills, generalized fatigue and confusion.  She also complains of worsening low back pain mostly in the lower lumbar region.  She has a history of chronic back pain and states that it is worse today.  She rates her pain an 8 x 10 in intensity at its worst and denies having any associated lower extremity weakness, no urinary or fecal incontinence and denies having saddle anesthesia.  She denies any recent trauma.  She complains of shortness of breath but denies having any cough or chest pain. She denies having any nausea, no vomiting, no abdominal pain, no dizziness, no lightheadedness, no headache, no blurred vision no focal deficit. She had a fever with a Tmax of 103 upon arrival to the ER, she was tachycardic and tachypneic.  Lactic acid was elevated at 2.8.  Slightly elevated AST level at 63 and an AKI with increase in her serum creatinine from 0.9-1.3. She received 2 L IV fluid bolus, vancomycin, Flagyl and cefepime and will be admitted to the hospital for further evaluation.  Review of Systems: As mentioned in the history of present illness. All other systems reviewed and are negative. Past Medical History:  Diagnosis Date   Abnormal uterine bleeding    ADHD (attention deficit hyperactivity disorder)    Allergy    Anxiety     Bipolar 1 disorder (HCC)    Bipolar disorder (HCC)    Cancer ([place])    uterine cancer   DDD (degenerative disc disease)    Depression    HSV infection    Hx: UTI (urinary tract infection)    Pertussis    Seizures ([place])    last seizure 12/2014    Thyroid disease    Past Surgical History:  Procedure Laterality Date   ABDOMINAL HYSTERECTOMY     CERVICAL SPINE SURGERY     x 3 2001,2005,2007 c5-6   CESAREAN SECTION  2003   EMBOLIZATION N/A 12/16/2017   Procedure: EMBOLIZATION;  Surgeon: Algernon Huxley, MD;  Location: [facility];  Service: Cardiovascular;  Laterality: N/A;   ESOPHAGOGASTRODUODENOSCOPY (EGD) WITH PROPOFOL N/A 11/01/2019   Procedure: ESOPHAGOGASTRODUODENOSCOPY (EGD) WITH PROPOFOL;  Surgeon: Robert Bellow, MD;  Location: [facility];  Service: Endoscopy;  Laterality: N/A;  POSITIVE ON 09/29/19 and 10/06/19   HYSTEROSCOPY N/A 10/11/2017   Procedure: HYSTEROSCOPY;  Surgeon: Harlin Heys, MD;  Location: [facility];  Service: Gynecology;  Laterality: N/A;   HYSTEROSCOPY WITH D & C N/A 10/11/2017   Procedure: DILATATION AND CURETTAGE /HYSTEROSCOPY;  Surgeon: Harlin Heys, MD;  Location: [facility];  Service: Gynecology;  Laterality: N/A;   LAPAROSCOPIC BILATERAL SALPINGO OOPHERECTOMY Bilateral 12/15/2017   Procedure: LAPAROSCOPIC BILATERAL SALPINGO OOPHORECTOMY;  Surgeon: Mellody Drown, MD;  Location: [facility];  Service: Gynecology;  Laterality: Bilateral;   LAPAROSCOPIC HYSTERECTOMY Bilateral 12/15/2017   Procedure:  HYSTERECTOMY TOTAL LAPAROSCOPIC;  Surgeon: Mellody Drown, MD;  Location: [facility];  Service: Gynecology;  Laterality: Bilateral;   SENTINEL NODE BIOPSY N/A 12/15/2017   Procedure: SENTINEL NODE QVZDGL,OVFIEPP;  Surgeon: Mellody Drown, MD;  Location: [facility];  Service: Gynecology;  Laterality: N/A;   TONSILLECTOMY     TUBAL LIGATION     Social History:  reports that she has never smoked. She has never used smokeless tobacco. She reports  current alcohol use. She reports that she does not currently use drugs.  Allergies  Allergen Reactions   Naproxen Anaphylaxis   Morphine And Related Itching    Family History  Problem Relation Age of Onset   Cervical cancer Mother    Cervical cancer Maternal Grandmother    Polycythemia Maternal Grandmother    Stomach cancer Father    Colon cancer Paternal Grandmother    Cervical cancer Cousin    Cervical cancer Cousin    Diabetes Mellitus II Neg Hx    CAD Neg Hx    Colon polyps Neg Hx    Heart disease Neg Hx    Rectal cancer Neg Hx     Prior to Admission medications   Medication Sig Start Date End Date Taking? Authorizing Provider  acetaminophen (TYLENOL) 500 MG tablet Take 500-1,000 mg by mouth every 6 (six) hours as needed for mild pain or headache.    Yes [provider]  amphetamine-dextroamphetamine (ADDERALL) 10 MG tablet Take 10 mg by mouth 2 (two) times daily.   Yes [provider]  Cariprazine HCl (VRAYLAR) 4.5 MG CAPS Take by mouth.   Yes [provider]  cyclobenzaprine (FLEXERIL) 10 MG tablet Take 10 mg by mouth 3 (three) times daily as needed for muscle spasms. 09/09/16  Yes [provider]  diphenhydrAMINE (BENADRYL ALLERGY) 25 mg capsule Take 1 capsule (25 mg total) by mouth every 6 (six) hours as needed for up to 5 days. 10/30/21 07/08/22 Yes Rada Hay, MD  DULoxetine (CYMBALTA) 30 MG capsule Take 30 mg by mouth 2 (two) times daily.   Yes [provider]  gabapentin (NEURONTIN) 300 MG capsule Take 300 mg by mouth 3 (three) times daily.   Yes [provider]  Oxycodone HCl 10 MG TABS Take 10 mg by mouth 4 (four) times daily as needed (moderate to severe pain).   Yes [provider]  aspirin EC 81 MG EC tablet Take 1 tablet (81 mg total) by mouth daily. Patient not taking: Reported on 07/08/2022 09/14/16   Rosita Fire, MD  EPINEPHrine 0.3 mg/0.3 mL IJ SOAJ injection Inject 0.3 mg into the  muscle once as needed (severe allergic reaction).    [provider]  famotidine (PEPCID) 20 MG tablet Take 1 tablet (20 mg total) by mouth 2 (two) times daily for 5 days. 10/30/21 11/04/21  Rada Hay, MD  guaiFENesin-dextromethorphan (ROBITUSSIN DM) 100-10 MG/5ML syrup Take 10 mLs by mouth every 4 (four) hours as needed for cough. Patient not taking: Reported on 07/08/2022 04/01/21   Max Sane, MD  traZODone (DESYREL) 100 MG tablet Take 1 tablet (100 mg total) by mouth at bedtime as needed for sleep. Patient not taking: Reported on 07/08/2022 11/22/15   Reyne Dumas, MD    Physical Exam: Vitals:   07/08/22 1117 07/08/22 1119 07/08/22 1130 07/08/22 1300  BP:  105/62 118/63 102/63  Pulse: 100 95 100 89  Resp: 16 (!) _0 Temp: 98.9 F (37.2 C)     TempSrc:  Oral     SpO2:  94%  95%   Physical Exam Vitals and nursing note reviewed.  Constitutional:      Comments: Lethargic but arouses to verbal stimuli  HENT:     Head: Normocephalic and atraumatic.     Mouth/Throat:     Mouth: Mucous membranes are moist.  Cardiovascular:     Rate and Rhythm: Tachycardia present.  Pulmonary:     Effort: Pulmonary effort is normal.     Breath sounds: Normal breath sounds.  Abdominal:     General: Bowel sounds are normal.     Palpations: Abdomen is soft.  Musculoskeletal:     Cervical back: Normal range of motion and neck supple.     Comments: Redness involving both lower extremities (left greater than right)  Skin:    General: Skin is warm and dry.  Neurological:     Mental Status: She is oriented to person, place, and time.     Motor: Weakness present.  Psychiatric:     Comments: Unable to assess     Data Reviewed: Relevant notes from primary care and specialist visits, past discharge summaries as available in EHR, including Care Everywhere. Prior diagnostic testing as pertinent to current admission diagnoses Updated medications and problem lists for reconciliation ED  course, including vitals, labs, imaging, treatment and response to treatment Triage notes, nursing and pharmacy notes and ED provider's notes Notable results as noted in HPI Labs reviewed.  Lactic acid 2.8 >> 1.4, ESR 68, white count 5.3, hemoglobin 11, hematocrit 33.7, platelet count 134, sodium 140, potassium 3.7, chloride 109, bicarb 22, glucose 99, BUN 19, creatinine 1.30, calcium 9.0, total protein 6.4, albumin 3.2, AST 63, ALT 32, alk phos 172, total bilirubin 0.6, Respiratory viral panel is negative Chest x-ray shows no evidence of acute cardiopulmonary disease CT scan of the head without contrast shows No acute intracranial abnormality seen. Twelve-lead EKG reviewed by me shows sinus tachycardia with T wave abnormalities in anterior leads There are no new results to review at this time.  Assessment and Plan: * SIRS due to infectious process with acute organ dysfunction [employer] [facility] Patient presents for evaluation of a fever with a Tmax of 103 and was tachycardic and tachypneic. Lactic acid was elevated at 2.8 and shows a downward trend No obvious source of sepsis at this time and no leukocytosis Continue empiric antibiotic therapy with vancomycin, cefepime and Flagyl and de-escalate once culture results become available Follow-up results of MRI of the lumbar spine as well as CT angiogram of the chest  Back pain Patient presents for evaluation of worsening lower back pain with associated fever Concern for possible osteomyelitis or abscess Follow-up results of MRI of lumbar spine Continue empiric antibiotic therapy with vancomycin and cefepime  AKI (acute kidney injury) ([place]) Patient has a baseline serum creatinine of 0.9 and today on admission it is 1.30 AKI appears to be secondary to poor oral intake Hydrate patient and repeat renal parameters in a.m.  GERD Continue Pepcid  Alcoholism ([place]) Patient with a history of alcoholism but denies recent ingestion and having symptoms of  alcohol withdrawal when she does not drink_0 Labs show AST >> ALT We will place patient on CIWA protocol and administer lorazepam for CIWA score of 8 or greater  Bipolar disorder ([place]) Continue Cymbalta and Cariprazine       Advance Care Planning:   Code Status: Full Code   Consults: None  Family Communication: Greater than 50% of time was spent discussing  patient's condition and plan of care with patient at the bedside.  All questions and concerns have been addressed.  Severity of Illness: The appropriate patient status for this patient is INPATIENT. Inpatient status is judged to be reasonable and necessary in order to provide the required intensity of service to ensure the patient's safety. The patient's presenting symptoms, physical exam findings, and initial radiographic and laboratory data in the context of their chronic comorbidities is felt to place them at high risk for further clinical deterioration. Furthermore, it is not anticipated that the patient will be medically stable for discharge from the hospital within 2 midnights of admission.   * I certify that at the point of admission it is my clinical judgment that the patient will require inpatient hospital care spanning beyond 2 midnights from the point of admission due to high intensity of service, high risk for further deterioration and high frequency of surveillance required.*  Author: Collier Bullock, MD 07/08/2022 1:34 PM  For on call review [email or web address].

## 2022-07-08 NOTE — Assessment & Plan Note (Signed)
Continue Cymbalta and Cariprazine

## 2022-07-08 NOTE — ED Notes (Signed)
US at bedside

## 2022-07-08 NOTE — ED Triage Notes (Signed)
Pt presents to ED with c/o of SOB and fever.   Pt states she has been shaking and was unsure she had a fever. Pt states recent cough with some production. Pt is A&Ox4.   Pt states she has a few of these episodes a few days ago.   Pt tearful due to having some spots on her face that itch.

## 2022-07-08 NOTE — Assessment & Plan Note (Signed)
Patient has a baseline serum creatinine of 0.9 and today on admission it is 1.30 AKI appears to be secondary to poor oral intake Hydrate patient and repeat renal parameters in a.m.

## 2022-07-08 NOTE — Consult Note (Signed)
PHARMACY -  BRIEF ANTIBIOTIC NOTE   Pharmacy has received consult(s) for Vancomycin and Cefepime from an ED provider.  The patient's profile has been reviewed for ht/wt/allergies/indication/available labs.    One time order(s) placed for Vancomycin 1000 mg IV x1 and Cefepime 2g IV x1  Further antibiotics/pharmacy consults should be ordered by admitting physician if indicated.                       Thank you,  Gretel Acre, PharmD PGY1 Pharmacy Resident 07/08/2022 9:31 AM

## 2022-07-08 NOTE — ED Notes (Signed)
Pt at MRI

## 2022-07-09 ENCOUNTER — Inpatient Hospital Stay
Admit: 2022-07-09 | Discharge: 2022-07-09 | Disposition: A | Discharge status: Home / Self Care | Payer: Medicare Other | Attending: Obstetrics and Gynecology | Admitting: Obstetrics and Gynecology

## 2022-07-09 DIAGNOSIS — R7881 Bacteremia: Secondary | ICD-10-CM | POA: Diagnosis not present

## 2022-07-09 DIAGNOSIS — N179 Acute kidney failure, unspecified: Secondary | ICD-10-CM | POA: Diagnosis not present

## 2022-07-09 DIAGNOSIS — A419 Sepsis, unspecified organism: Secondary | ICD-10-CM | POA: Diagnosis not present

## 2022-07-09 DIAGNOSIS — F1990 Other psychoactive substance use, unspecified, uncomplicated: Secondary | ICD-10-CM

## 2022-07-09 DIAGNOSIS — F1120 Opioid dependence, uncomplicated: Secondary | ICD-10-CM

## 2022-07-09 LAB — BLOOD CULTURE ID PANEL (REFLEXED) - BCID2

## 2022-07-09 LAB — URINE CULTURE: Culture: NO GROWTH

## 2022-07-09 LAB — COMPREHENSIVE METABOLIC PANEL
ALT: 24 U/L (ref 0–44)
AST: 36 U/L (ref 15–41)
Albumin: 2.7 g/dL — ABNORMAL LOW (ref 3.5–5.0)
Alkaline Phosphatase: 94 U/L (ref 38–126)
Anion gap: 6 (ref 5–15)
BUN: 14 mg/dL (ref 6–20)
CO2: 26 mmol/L (ref 22–32)
Calcium: 8.7 mg/dL — ABNORMAL LOW (ref 8.9–10.3)
Chloride: 111 mmol/L (ref 98–111)
Creatinine, Ser: 0.96 mg/dL (ref 0.44–1.00)
GFR, Estimated: >60 mL/min (ref >60)
Glucose, Bld: 93 mg/dL (ref 70–99)
Potassium: 3.9 mmol/L (ref 3.5–5.1)
Sodium: 143 mmol/L (ref 135–145)
Total Bilirubin: 0.7 mg/dL (ref 0.3–1.2)
Total Protein: 5.8 g/dL — ABNORMAL LOW (ref 6.5–8.1)

## 2022-07-09 LAB — PREGNANCY, URINE: Preg Test, Ur: NEGATIVE

## 2022-07-09 LAB — PROTIME-INR
INR: 1 (ref 0.8–1.2)
Prothrombin Time: 13 seconds (ref 11.4–15.2)

## 2022-07-09 LAB — CORTISOL-AM, BLOOD: Cortisol - AM: 4.6 ug/dL — ABNORMAL LOW (ref 6.7–22.6)

## 2022-07-09 LAB — GLUCOSE, CAPILLARY
Glucose-Capillary: 99 mg/dL (ref 70–99)
Glucose-Capillary: 112 mg/dL — ABNORMAL HIGH (ref 70–99)

## 2022-07-09 LAB — HIV ANTIBODY (ROUTINE TESTING W REFLEX): HIV Screen 4th Generation wRfx: NONREACTIVE

## 2022-07-09 LAB — PROCALCITONIN: Procalcitonin: 9.79 ng/mL

## 2022-07-09 MED ORDER — INFLUENZA VAC SPLIT QUAD 0.5 ML IM SUSY: 0.5000 mL | PREFILLED_SYRINGE | INTRAMUSCULAR | Status: DC

## 2022-07-09 MED ORDER — PNEUMOCOCCAL 20-VAL CONJ VACC 0.5 ML IM SUSY
0.5000 mL | PREFILLED_SYRINGE | INTRAMUSCULAR | Status: DC
  Filled 2022-07-09: qty 0.5

## 2022-07-09 MED ORDER — ENOXAPARIN SODIUM 40 MG/0.4ML IJ SOSY
40.0000 mg | PREFILLED_SYRINGE | INTRAMUSCULAR | Status: DC
  Administered 2022-07-09 – 2022-07-12 (×4): 40 mg via SUBCUTANEOUS
  Filled 2022-07-09 (×4): qty 0.4

## 2022-07-09 NOTE — Care Management Important Message (Signed)
Important Message  Patient Details  Name: [name] MRN: [id number] Date of Birth: May 16, 1966   Medicare Important Message Given:  N/A - LOS <3 / Initial given by admissions     Dannette Barbara 07/09/2022, 5:09 PM

## 2022-07-09 NOTE — Progress Notes (Signed)
Per Dr Si Raider, dc tele monitoring

## 2022-07-09 NOTE — Consult Note (Signed)
NAME: [name]  DOB: 28-Oct-1965  MRN: [id number]  Date/Time: 07/09/2022 1:16 PM  REQUESTING PROVIDER: Dr Si Raider Subjective:  REASON FOR CONSULT: bacteremia ? [name] is a 56 y.o. with a history of bipolar disorder, thyroid disease presented to the ER for evaluation of fever and shortness of breath. She has had these symptoms intermittently for months.  She has also had fatigue and confusion.  She was also complaining of low back pain mostly in the lower lumbar region. In the ED vitals BP of 91/58, temperature 97.8, and later her temperature was 103.2, with a heart rate of 116 , sats 97%. WBC 5.3, Hb 11, platelet 134 and creatinine 1.30. Blood cultures sent and she was started on broad-spectrum antibiotic of Vanco, cefepime and metronidazole.  Blood culture came back positive for Enterobacter dialysis.  The subspecies has not been identified.  So the antibiotic is being de-escalated to cefepime and I am asked to see the patient. CT of the head is normal. CT of the chest showed enlargement of the pulmonary arterial trunk and right and left main pulmonary arteries suggesting pulmonary hypertension.  There was borderline mediastinal hilar lymph nodes.  There was bibasilar subpleural atelectasis. Urine toxicology was positive for opiate. EKG showed borderline prolonged QT interval and sinus tachycardia.  The lumbar spine showed worsening of bilateral facet degeneration at L5-S1.  There was no sign of infection in the lumbar upper sacral region.  MRI of the thoracic spine was negative for any infection.  MRI of the cervical spine showed no sign of infection in the cervical region.  There was previous ACDF C4-C7 with solid union and wide patency of the canal and foramina.  Past Medical History:  Diagnosis Date   Abnormal uterine bleeding    ADHD (attention deficit hyperactivity disorder)    Allergy    Anxiety    Bipolar 1 disorder (HCC)    Bipolar disorder (HCC)    Cancer  ([place])    uterine cancer   DDD (degenerative disc disease)    Depression    HSV infection    Hx: UTI (urinary tract infection)    Pertussis    Seizures ([place])    last seizure 12/2014    Thyroid disease     Past Surgical History:  Procedure Laterality Date   ABDOMINAL HYSTERECTOMY     CERVICAL SPINE SURGERY     x 3 2001,2005,2007 c5-6   CESAREAN SECTION  2003   EMBOLIZATION N/A 12/16/2017   Procedure: EMBOLIZATION;  Surgeon: Algernon Huxley, MD;  Location: [facility];  Service: Cardiovascular;  Laterality: N/A;   ESOPHAGOGASTRODUODENOSCOPY (EGD) WITH PROPOFOL N/A 11/01/2019   Procedure: ESOPHAGOGASTRODUODENOSCOPY (EGD) WITH PROPOFOL;  Surgeon: Robert Bellow, MD;  Location: [facility];  Service: Endoscopy;  Laterality: N/A;  POSITIVE ON 09/29/19 and 10/06/19   HYSTEROSCOPY N/A 10/11/2017   Procedure: HYSTEROSCOPY;  Surgeon: Harlin Heys, MD;  Location: [facility];  Service: Gynecology;  Laterality: N/A;   HYSTEROSCOPY WITH D & C N/A 10/11/2017   Procedure: DILATATION AND CURETTAGE /HYSTEROSCOPY;  Surgeon: Harlin Heys, MD;  Location: [facility];  Service: Gynecology;  Laterality: N/A;   LAPAROSCOPIC BILATERAL SALPINGO OOPHERECTOMY Bilateral 12/15/2017   Procedure: LAPAROSCOPIC BILATERAL SALPINGO OOPHORECTOMY;  Surgeon: Mellody Drown, MD;  Location: [facility];  Service: Gynecology;  Laterality: Bilateral;   LAPAROSCOPIC HYSTERECTOMY Bilateral 12/15/2017   Procedure: HYSTERECTOMY TOTAL LAPAROSCOPIC;  Surgeon: Mellody Drown, MD;  Location: [facility];  Service: Gynecology;  Laterality: Bilateral;  SENTINEL NODE BIOPSY N/A 12/15/2017   Procedure: SENTINEL NODE TKWIOX,BDZHGDJ;  Surgeon: Mellody Drown, MD;  Location: [facility];  Service: Gynecology;  Laterality: N/A;   TONSILLECTOMY     TUBAL LIGATION      Social History   Socioeconomic History   Marital status: Married    Spouse name: Not on file   Number of children: 4   Years of education: Not on file   Highest  education level: Not on file  Occupational History   Not on file  Tobacco Use   Smoking status: Never   Smokeless tobacco: Never  Vaping Use   Vaping Use: Some days  Substance and Sexual Activity   Alcohol use: Yes    Comment: occassionally    Drug use: Not Currently    Comment: last used in 1990's   Sexual activity: Yes    Birth control/protection: Surgical  Other Topics Concern   Not on file  Social History Narrative   Not on file   Social Determinants of Health   Financial Resource Strain: Not on file  Food Insecurity: Food Insecurity Present (07/08/2022)   Hunger Vital Sign    Worried About Running Out of Food in the Last Year: Never true    Ran Out of Food in the Last Year: Sometimes true  Transportation Needs: No Transportation Needs (07/08/2022)   PRAPARE - Hydrologist (Medical): No    Lack of Transportation (Non-Medical): No  Physical Activity: Not on file  Stress: Not on file  Social Connections: Not on file  Intimate Partner Violence: Not At Risk (07/08/2022)   Humiliation, Afraid, Rape, and Kick questionnaire    Fear of Current or Ex-Partner: No    Emotionally Abused: No    Physically Abused: No    Sexually Abused: No    Family History  Problem Relation Age of Onset   Cervical cancer Mother    Cervical cancer Maternal Grandmother    Polycythemia Maternal Grandmother    Stomach cancer Father    Colon cancer Paternal Grandmother    Cervical cancer Cousin    Cervical cancer Cousin    Diabetes Mellitus II Neg Hx    CAD Neg Hx    Colon polyps Neg Hx    Heart disease Neg Hx    Rectal cancer Neg Hx    Allergies  Allergen Reactions   Naproxen Anaphylaxis   Morphine And Related Itching   I? Current Facility-Administered Medications  Medication Dose Route Frequency Provider Last Rate Last Admin   acetaminophen (TYLENOL) tablet 500-1,000 mg  500-1,000 mg Oral Q6H PRN Agbata, Tochukwu, MD       amphetamine-dextroamphetamine  (ADDERALL) tablet 10 mg  10 mg Oral BID Agbata, Tochukwu, MD   10 mg at 07/09/22 1315   cariprazine (VRAYLAR) capsule 4.5 mg  4.5 mg Oral Daily Agbata, Tochukwu, MD       ceFEPIme (MAXIPIME) 2 g in sodium chloride 0.9 % 100 mL IVPB  2 g Intravenous Q12H Coulter, Carolyn, RPH 200 mL/hr at 07/09/22 1216 2 g at 07/09/22 1216   cyclobenzaprine (FLEXERIL) tablet 10 mg  10 mg Oral TID PRN Agbata, Tochukwu, MD   10 mg at 07/09/22 1315   DULoxetine (CYMBALTA) DR capsule 30 mg  30 mg Oral BID Agbata, Tochukwu, MD   30 mg at 07/09/22 0800   enoxaparin (LOVENOX) injection 40 mg  40 mg Subcutaneous Q24H Wouk, Ailene Rud, MD       famotidine (PEPCID) tablet  20 mg  20 mg Oral BID Agbata, Tochukwu, MD   20 mg at 19/37/90 2409   folic acid (FOLVITE) tablet 1 mg  1 mg Oral Daily Agbata, Tochukwu, MD   1 mg at 07/09/22 0800   gabapentin (NEURONTIN) capsule 300 mg  300 mg Oral TID Agbata, Tochukwu, MD   300 mg at 07/09/22 0800   influenza vac split quadrivalent PF (FLUARIX) injection 0.5 mL  0.5 mL Intramuscular Tomorrow-1000 Agbata, Tochukwu, MD       LORazepam (ATIVAN) tablet 1-4 mg  1-4 mg Oral Q1H PRN Agbata, Tochukwu, MD       Or   LORazepam (ATIVAN) injection 1-4 mg  1-4 mg Intravenous Q1H PRN Agbata, Tochukwu, MD       LORazepam (ATIVAN) tablet 0-4 mg  0-4 mg Oral Q6H Agbata, Tochukwu, MD       Followed by   Derrill Memo ON 07/10/2022] LORazepam (ATIVAN) tablet 0-4 mg  0-4 mg Oral Q12H Agbata, Tochukwu, MD       multivitamin with minerals tablet 1 tablet  1 tablet Oral Daily Agbata, Tochukwu, MD   1 tablet at 07/09/22 0800   ondansetron (ZOFRAN) tablet 4 mg  4 mg Oral Q6H PRN Agbata, Tochukwu, MD       Or   ondansetron (ZOFRAN) injection 4 mg  4 mg Intravenous Q6H PRN Agbata, Tochukwu, MD       oxyCODONE (Oxy IR/ROXICODONE) immediate release tablet 10 mg  10 mg Oral QID PRN Agbata, Tochukwu, MD   10 mg at 07/09/22 1315   pneumococcal 20-valent conjugate vaccine (PREVNAR 20) injection 0.5 mL  0.5 mL Intramuscular  Tomorrow-1000 Agbata, Tochukwu, MD       thiamine (VITAMIN B1) tablet 100 mg  100 mg Oral Daily Agbata, Tochukwu, MD   100 mg at 07/09/22 0800   Or   thiamine (VITAMIN B1) injection 100 mg  100 mg Intravenous Daily Agbata, Tochukwu, MD         Abtx:  Anti-infectives (From admission, onward)    Start     Dose/Rate Route Frequency Ordered Stop   07/08/22 2200  metroNIDAZOLE (FLAGYL) IVPB 500 mg  Status:  Discontinued        500 mg 100 mL/hr over 60 Minutes Intravenous Every 12 hours 07/08/22 1310 07/09/22 0946   07/08/22 2200  ceFEPIme (MAXIPIME) 2 g in sodium chloride 0.9 % 100 mL IVPB        2 g 200 mL/hr over 30 Minutes Intravenous Every 12 hours 07/08/22 1635     07/08/22 1730  vancomycin (VANCOREADY) IVPB 500 mg/100 mL        500 mg 100 mL/hr over 60 Minutes Intravenous  Once 07/08/22 1635 07/08/22 1826   07/08/22 1636  vancomycin variable dose per unstable renal function (pharmacist dosing)  Status:  Discontinued         Does not apply See admin instructions 07/08/22 1636 07/09/22 0946   07/08/22 0930  ceFEPIme (MAXIPIME) 2 g in sodium chloride 0.9 % 100 mL IVPB        2 g 200 mL/hr over 30 Minutes Intravenous  Once 07/08/22 0920 07/08/22 1002   07/08/22 0930  metroNIDAZOLE (FLAGYL) IVPB 500 mg        500 mg 100 mL/hr over 60 Minutes Intravenous  Once 07/08/22 0920 07/08/22 1103   07/08/22 0930  vancomycin (VANCOCIN) IVPB 1000 mg/200 mL premix        1,000 mg 200 mL/hr over 60 Minutes Intravenous  Once 07/08/22  0920 07/08/22 1507       REVIEW OF SYSTEMS:  Const:  feeling cold, shaking, negative weight loss Eyes: negative diplopia or visual changes, negative eye pain ENT: negative coryza, negative sore throat Resp: negative cough, hemoptysis, dyspnea Cards: negative for chest pain, palpitations, lower extremity edema GU: negative for frequency, dysuria and hematuria GI: Negative for abdominal pain, diarrhea, bleeding, constipation Skin: itching and excoriations Heme:  negative for easy bruising and gum/nose bleeding MS: general weakness Neurolo:negative for headaches, dizziness, vertigo, memory problems  Psych:  anxiety, depression  Endocrine: negative for thyroid, diabetes Allergy/Immunology- as above: Objective:  VITALS:  BP 128/79 (BP Location: Right Arm)   Pulse 74   Temp 98.4 F (36.9 C) (Oral)   Resp 16   Ht '5\' 1"'$  (1.549 m)   Wt 57.6 kg Comment: 02/2022  LMP 09/22/2017 (Exact Date)   SpO2 99%   BMI 23.99 kg/m   PHYSICAL EXAM:  General: Alert, cooperative, no distress, pale.  Head: Normocephalic, without obvious abnormality, atraumatic. Eyes: Conjunctivae clear, anicteric sclerae. Pupils are equal ENT Nares normal. No drainage or sinus tenderness. Poor dentition ( pt says she had a history of bulimia) Neck: surgical scar Back: No CVA tenderness. Lungs: Clear to auscultation bilaterally. No Wheezing or Rhonchi. No rales. Heart: Regular rate and rhythm, no murmur, rub or gallop. Abdomen: Soft, non-tender,not distended. Bowel sounds normal. No masses Extremities: atraumatic, no cyanosis. No edema. No clubbing Skin:small excoriations over left upper lid, chil, leg and back Lymph: Cervical, supraclavicular normal. Neurologic: Grossly non-focal Pertinent Labs Lab Results CBC    Component Value Date/Time   WBC 5.3 07/08/2022 0915   RBC 3.78 (L) 07/08/2022 0915   HGB 11.0 (L) 07/08/2022 0915   HCT 33.7 (L) 07/08/2022 0915   PLT 134 (L) 07/08/2022 0915   MCV 89.2 07/08/2022 0915   MCH 29.1 07/08/2022 0915   MCHC 32.6 07/08/2022 0915   RDW 14.7 07/08/2022 0915   LYMPHSABS 0.7 07/08/2022 0915   MONOABS 0.0 (L) 07/08/2022 0915   EOSABS 0.1 07/08/2022 0915   BASOSABS 0.0 07/08/2022 0915       Latest Ref Rng & Units 07/09/2022    4:36 AM 07/08/2022    9:15 AM 02/23/2022    3:53 PM  CMP  Glucose 70 - 99 mg/dL 93  99  92   BUN 6 - 20 mg/dL '14  19  15   '$ Creatinine 0.44 - 1.00 mg/dL 0.96  1.30  0.90   Sodium 135 - 145 mmol/L 143  140   138   Potassium 3.5 - 5.1 mmol/L 3.9  3.7  3.8   Chloride 98 - 111 mmol/L 111  109  104   CO2 22 - 32 mmol/L '26  22  23   '$ Calcium 8.9 - 10.3 mg/dL 8.7  9.0  9.4   Total Protein 6.5 - 8.1 g/dL 5.8  6.4    Total Bilirubin 0.3 - 1.2 mg/dL 0.7  0.6    Alkaline Phos 38 - 126 U/L 94  172    AST 15 - 41 U/L 36  63    ALT 0 - 44 U/L 24  32        Microbiology: Recent Results (from the past 240 hour(s))  Resp Panel by RT-PCR (Flu A&B, Covid) Anterior Nasal Swab     Status: None   Collection Time: 07/08/22  9:15 AM   Specimen: Anterior Nasal Swab  Result Value Ref Range Status   SARS Coronavirus 2 by RT  PCR NEGATIVE NEGATIVE Final    Comment: (NOTE) SARS-CoV-2 target nucleic acids are NOT DETECTED.  The SARS-CoV-2 RNA is generally detectable in upper respiratory specimens during the acute phase of infection. The lowest concentration of SARS-CoV-2 viral copies this assay can detect is 138 copies/mL. A negative result does not preclude SARS-Cov-2 infection and should not be used as the sole basis for treatment or other patient management decisions. A negative result may occur with  improper specimen collection/handling, submission of specimen other than nasopharyngeal swab, presence of viral mutation(s) within the areas targeted by this assay, and inadequate number of viral copies(<138 copies/mL). A negative result must be combined with clinical observations, patient history, and epidemiological information. The expected result is Negative.  Fact Sheet for Patients:  EntrepreneurPulse.com.au  Fact Sheet for Healthcare Providers:  IncredibleEmployment.be  This test is no t yet approved or cleared by the [place] FDA and  has been authorized for detection and/or diagnosis of SARS-CoV-2 by FDA under an Emergency Use Authorization (EUA). This EUA will remain  in effect (meaning this test can be used) for the duration of the COVID-19 declaration  under Section 564(b)(1) of the Act, 21 U.S.C.section 360bbb-3(b)(1), unless the authorization is terminated  or revoked sooner.       Influenza A by PCR NEGATIVE NEGATIVE Final   Influenza B by PCR NEGATIVE NEGATIVE Final    Comment: (NOTE) The Xpert Xpress SARS-CoV-2/FLU/RSV plus assay is intended as an aid in the diagnosis of influenza from Nasopharyngeal swab specimens and should not be used as a sole basis for treatment. Nasal washings and aspirates are unacceptable for Xpert Xpress SARS-CoV-2/FLU/RSV testing.  Fact Sheet for Patients: EntrepreneurPulse.com.au  Fact Sheet for Healthcare Providers: IncredibleEmployment.be  This test is not yet approved or cleared by the [place] FDA and has been authorized for detection and/or diagnosis of SARS-CoV-2 by FDA under an Emergency Use Authorization (EUA). This EUA will remain in effect (meaning this test can be used) for the duration of the COVID-19 declaration under Section 564(b)(1) of the Act, 21 U.S.C. section 360bbb-3(b)(1), unless the authorization is terminated or revoked.  Performed at [facility], [address] [place], [place] [address]   Blood Culture (routine x 2)     Status: None (Preliminary result)   Collection Time: 07/08/22  9:15 AM   Specimen: BLOOD  Result Value Ref Range Status   Specimen Description   Final    BLOOD BLOOD LEFT WRIST Performed at [facility], [address], [place], [place] [address]    Special Requests   Final    BOTTLES DRAWN AEROBIC AND ANAEROBIC Blood Culture adequate volume Performed at [facility], [address] [place], [place] [address]    Culture  Setup Time   Final    GRAM NEGATIVE RODS IN BOTH AEROBIC AND ANAEROBIC BOTTLES CRITICAL RESULT CALLED TO, READ BACK BY AND VERIFIED WITH: Violeta Gelinas 07/09/22 0703 MW Performed at [facility], [place] [address], [place], [place] [address]    Culture  GRAM NEGATIVE RODS  Final   Report Status PENDING  Incomplete  Blood Culture (routine x 2)     Status: None (Preliminary result)   Collection Time: 07/08/22  9:15 AM   Specimen: BLOOD  Result Value Ref Range Status   Specimen Description BLOOD BLOOD RIGHT FOREARM  Final   Special Requests   Final    BOTTLES DRAWN AEROBIC AND ANAEROBIC Blood Culture results may not be optimal due to  an excessive volume of blood received in culture bottles   Culture  Setup Time   Final    GRAM NEGATIVE RODS IN BOTH AEROBIC AND ANAEROBIC BOTTLES CRITICAL RESULT CALLED TO, READ BACK BY AND VERIFIED WITH: JASON ROBBINS 07/09/22 0703 MW Performed at [facility], [address] [place], [place] [address]    Culture GRAM NEGATIVE RODS  Final   Report Status PENDING  Incomplete  Blood Culture ID Panel (Reflexed)     Status: Abnormal   Collection Time: 07/08/22  9:15 AM  Result Value Ref Range Status   Enterococcus faecalis NOT DETECTED NOT DETECTED Final   Enterococcus Faecium NOT DETECTED NOT DETECTED Final   Listeria monocytogenes NOT DETECTED NOT DETECTED Final   Staphylococcus species NOT DETECTED NOT DETECTED Final   Staphylococcus aureus (BCID) NOT DETECTED NOT DETECTED Final   Staphylococcus epidermidis NOT DETECTED NOT DETECTED Final   Staphylococcus lugdunensis NOT DETECTED NOT DETECTED Final   Streptococcus species NOT DETECTED NOT DETECTED Final   Streptococcus agalactiae NOT DETECTED NOT DETECTED Final   Streptococcus pneumoniae NOT DETECTED NOT DETECTED Final   Streptococcus pyogenes NOT DETECTED NOT DETECTED Final   A.calcoaceticus-baumannii NOT DETECTED NOT DETECTED Final   Bacteroides fragilis NOT DETECTED NOT DETECTED Final   Enterobacterales DETECTED (A) NOT DETECTED Final    Comment: Enterobacterales represent a large order of gram negative bacteria, not a single organism. Refer to culture for further identification. CRITICAL RESULT CALLED TO, READ BACK BY AND VERIFIED  WITH: JASON ROBBINS 07/09/22 0703 MW    Enterobacter cloacae complex NOT DETECTED NOT DETECTED Final   Escherichia coli NOT DETECTED NOT DETECTED Final   Klebsiella aerogenes NOT DETECTED NOT DETECTED Final   Klebsiella oxytoca NOT DETECTED NOT DETECTED Final   Klebsiella pneumoniae NOT DETECTED NOT DETECTED Final   Proteus species NOT DETECTED NOT DETECTED Final   Salmonella species NOT DETECTED NOT DETECTED Final   Serratia marcescens NOT DETECTED NOT DETECTED Final   Haemophilus influenzae NOT DETECTED NOT DETECTED Final   Neisseria meningitidis NOT DETECTED NOT DETECTED Final   Pseudomonas aeruginosa NOT DETECTED NOT DETECTED Final   Stenotrophomonas maltophilia NOT DETECTED NOT DETECTED Final   Candida albicans NOT DETECTED NOT DETECTED Final   Candida auris NOT DETECTED NOT DETECTED Final   Candida glabrata NOT DETECTED NOT DETECTED Final   Candida krusei NOT DETECTED NOT DETECTED Final   Candida parapsilosis NOT DETECTED NOT DETECTED Final   Candida tropicalis NOT DETECTED NOT DETECTED Final   Cryptococcus neoformans/gattii NOT DETECTED NOT DETECTED Final   CTX-M ESBL NOT DETECTED NOT DETECTED Final   Carbapenem resistance IMP NOT DETECTED NOT DETECTED Final   Carbapenem resistance KPC NOT DETECTED NOT DETECTED Final   Carbapenem resistance NDM NOT DETECTED NOT DETECTED Final   Carbapenem resist OXA 48 LIKE NOT DETECTED NOT DETECTED Final   Carbapenem resistance VIM NOT DETECTED NOT DETECTED Final    Comment: Performed at [name], [address], [place], [place] [address]  Urine Culture     Status: None   Collection Time: 07/08/22  9:20 AM   Specimen: In/Out Cath Urine  Result Value Ref Range Status   Specimen Description   Final    IN/OUT CATH URINE Performed at [facility], [address], [place], [place] [address]    Special Requests   Final    NONE Performed at [facility], [address] [place], [place] [address]    Culture    Final  NO GROWTH Performed at [facility], [place] [address] [place], [place] [address]    Report Status 07/09/2022 FINAL  Final  Respiratory (~20 pathogens) panel by PCR     Status: None   Collection Time: 07/08/22  1:00 PM   Specimen: Nasopharyngeal Swab; Respiratory  Result Value Ref Range Status   Adenovirus NOT DETECTED NOT DETECTED Final   Coronavirus 229E NOT DETECTED NOT DETECTED Final    Comment: (NOTE) The Coronavirus on the Respiratory Panel, DOES NOT test for the novel  Coronavirus (2019 nCoV)    Coronavirus HKU1 NOT DETECTED NOT DETECTED Final   Coronavirus NL63 NOT DETECTED NOT DETECTED Final   Coronavirus OC43 NOT DETECTED NOT DETECTED Final   Metapneumovirus NOT DETECTED NOT DETECTED Final   Rhinovirus / Enterovirus NOT DETECTED NOT DETECTED Final   Influenza A NOT DETECTED NOT DETECTED Final   Influenza B NOT DETECTED NOT DETECTED Final   Parainfluenza Virus 1 NOT DETECTED NOT DETECTED Final   Parainfluenza Virus 2 NOT DETECTED NOT DETECTED Final   Parainfluenza Virus 3 NOT DETECTED NOT DETECTED Final   Parainfluenza Virus 4 NOT DETECTED NOT DETECTED Final   Respiratory Syncytial Virus NOT DETECTED NOT DETECTED Final   Bordetella pertussis NOT DETECTED NOT DETECTED Final   Bordetella Parapertussis NOT DETECTED NOT DETECTED Final   Chlamydophila pneumoniae NOT DETECTED NOT DETECTED Final   Mycoplasma pneumoniae NOT DETECTED NOT DETECTED Final    Comment: Performed at [facility], [place] [address], [place], [place] [address]    IMAGING RESULTS: I have personally reviewed the films ? Impression/Recommendation Gram-negative bacteremia with sepsis. Unclear source No UTI Need 2 d echo and may be TEE to look for endocarditis Imaging of her entire spine no infectious finding May need CT abd/pelvis  Continue cefepime.  Await ID of gram-negative bacteria and susceptibility  ACDF cervical spine- no infection  AKI resolved Mildly elevated AST  Low  cortisol Check ACTH  Pt denied IVDU- she says she takes opioids for pain control and is with a pain management clinic ___________________________________________________ Discussed with patient, care team Note:  This document was prepared using Dragon voice recognition software and may include unintentional dictation errors.

## 2022-07-09 NOTE — Progress Notes (Signed)
*  PRELIMINARY RESULTS* Echocardiogram 2D Echocardiogram has been performed.  [name] 07/09/2022, 11:46 AM

## 2022-07-09 NOTE — Progress Notes (Signed)
Mobility Specialist - Progress Note    07/09/22 1100  Mobility  Activity Ambulated independently in hallway  Level of Assistance Independent  Assistive Device None  Distance Ambulated (ft) 180 ft  Activity Response Tolerated well  $Mobility charge 1 Mobility   Pt supine upon entry, utilizing RA. Pt ambulated one lap around NS without AD,tolerated well. Pt left supine with needs within reach. No complaints.   Candie Mile Mobility Specialist 07/09/22 11:01 AM

## 2022-07-09 NOTE — Progress Notes (Signed)
PROGRESS NOTE    [name]  QPY:[id number] DOB: 1966/09/02 DOA: 07/08/2022 PCP: Sandi Mariscal, MD      Brief Narrative:   From admission h and p [name] is a 56 y.o. female with medical history significant for Bipolar disorder, anxiety, depression, thyroid disease who presents to the ER for evaluation of fever and shortness of breath. Patient states that she has had symptoms intermittently over months which she describes as fever, chills, generalized fatigue and confusion.  She also complains of worsening low back pain mostly in the lower lumbar region.  She has a history of chronic back pain and states that it is worse today.  She rates her pain an 8 x 10 in intensity at its worst and denies having any associated lower extremity weakness, no urinary or fecal incontinence and denies having saddle anesthesia.  She denies any recent trauma.  She complains of shortness of breath but denies having any cough or chest pain. She denies having any nausea, no vomiting, no abdominal pain, no dizziness, no lightheadedness, no headache, no blurred vision no focal deficit. She had a fever with a Tmax of 103 upon arrival to the ER, she was tachycardic and tachypneic.  Lactic acid was elevated at 2.8.  Slightly elevated AST level at 63 and an AKI with increase in her serum creatinine from 0.9-1.3.   Assessment & Plan:   Principal Problem:   Bacteremia Active Problems:   Sepsis ([place])   Back pain   AKI (acute kidney injury) ([place])   Bipolar disorder ([place])   Alcoholism ([place])   GERD   Opioid dependence ([place])   IVDU (intravenous drug user)  # Sepsis 2/2 bacteremia, by fever and tachycardia and lactic acidosis. Lactate normalized w/ fluids, hemodynamically stable  # Gram negative rod bacteremia Secondary to IVDU, no other clear sources of infection - cont cefepime, d/c vanc/flagyl - TTE - ID consult - f/u speciation and sensitivities  # IVDU Did not admit to this on presentation  but now admits to occasional IVDU (opioids) - monitor for s/s withdrawal - f/u rpr, hiv, hcv, hbv - TOC consult  # Chronic pain # Opioid dependence - cont home oxy, gabapentin, flexeril  # Bipolar - cont home cariprazine  # ADHD - cont home adderall  # Alcohol use Reports couple of drinks a day, low risk for etoh withdrawal if this triw - monitor on ciwa - vitamins   DVT prophylaxis: lovenox Code Status: full Family Communication: none @ bedside  Level of care: Telemetry Medical Status is: Inpatient Remains inpatient appropriate because: severity of illness    Consultants:  ID  Procedures: none  Antimicrobials:  Vanc/cefepime/flagyl>cefepime    Subjective: Feels well  Objective: Vitals:   07/08/22 2004 07/09/22 0013 07/09/22 0456 07/09/22 0800  BP: (!) 91/58 102/69 (!) 111/52 128/79  Pulse: 71 65 64 74  Resp: '16 16 16   '$ Temp: 97.8 F (36.6 C) 98 F (36.7 C) 97.6 F (36.4 C) 98.4 F (36.9 C)  TempSrc:    Oral  SpO2: 97% 98% 98% 99%  Weight:      Height:        Intake/Output Summary (Last 24 hours) at 07/09/2022 0948 Last data filed at 07/08/2022 2127 Gross per 24 hour  Intake 422.62 ml  Output 0 ml  Net 422.62 ml   Filed Weights   07/08/22 1400  Weight: 57.6 kg    Examination:  General exam: Appears calm and comfortable  Respiratory system: Clear  to auscultation. Respiratory effort normal. Cardiovascular system: S1 & S2 heard, RRR. No JVD, murmurs, rubs, gallops or clicks. No pedal edema. Gastrointestinal system: Abdomen is nondistended, soft and nontender. No organomegaly or masses felt. Normal bowel sounds heard. Central nervous system: Alert and oriented. No focal neurological deficits. Extremities: Symmetric 5 x 5 power. Skin: No rashes, lesions or ulcers Psychiatry: Judgement and insight appear normal. Mood & affect appropriate.     Data Reviewed: I have personally reviewed following labs and imaging studies  CBC: Recent Labs   Lab 07/08/22 0915  WBC 5.3  NEUTROABS 4.4  HGB 11.0*  HCT 33.7*  MCV 89.2  PLT 893*   Basic Metabolic Panel: Recent Labs  Lab 07/08/22 0915 07/09/22 0436  NA 140 143  K 3.7 3.9  CL 109 111  CO2 22 26  GLUCOSE 99 93  BUN 19 14  CREATININE 1.30* 0.96  CALCIUM 9.0 8.7*   GFR: Estimated Creatinine Clearance: 52.8 mL/min (by C-G formula based on SCr of 0.96 mg/dL). Liver Function Tests: Recent Labs  Lab 07/08/22 0915 07/09/22 0436  AST 63* 36  ALT 32 24  ALKPHOS 172* 94  BILITOT 0.6 0.7  PROT 6.4* 5.8*  ALBUMIN 3.2* 2.7*   No results for input(s): "LIPASE", "AMYLASE" in the last 168 hours. No results for input(s): "AMMONIA" in the last 168 hours. Coagulation Profile: Recent Labs  Lab 07/08/22 0915 07/09/22 0436  INR 0.9 1.0   Cardiac Enzymes: No results for input(s): "CKTOTAL", "CKMB", "CKMBINDEX", "TROPONINI" in the last 168 hours. BNP (last 3 results) No results for input(s): "PROBNP" in the last 8760 hours. HbA1C: No results for input(s): "HGBA1C" in the last 72 hours. CBG: No results for input(s): "GLUCAP" in the last 168 hours. Lipid Profile: No results for input(s): "CHOL", "HDL", "LDLCALC", "TRIG", "CHOLHDL", "LDLDIRECT" in the last 72 hours. Thyroid Function Tests: No results for input(s): "TSH", "T4TOTAL", "FREET4", "T3FREE", "THYROIDAB" in the last 72 hours. Anemia Panel: No results for input(s): "VITAMINB12", "FOLATE", "FERRITIN", "TIBC", "IRON", "RETICCTPCT" in the last 72 hours. Urine analysis:    Component Value Date/Time   COLORURINE YELLOW (A) 07/08/2022 0920   APPEARANCEUR HAZY (A) 07/08/2022 0920   LABSPEC 1.019 07/08/2022 0920   PHURINE 5.0 07/08/2022 0920   GLUCOSEU NEGATIVE 07/08/2022 0920   HGBUR SMALL (A) 07/08/2022 0920   BILIRUBINUR NEGATIVE 07/08/2022 0920   KETONESUR NEGATIVE 07/08/2022 0920   PROTEINUR 100 (A) 07/08/2022 0920   UROBILINOGEN 0.2 05/26/2015 0722   NITRITE NEGATIVE 07/08/2022 0920   LEUKOCYTESUR NEGATIVE  07/08/2022 0920   Sepsis Labs: '@LABRCNTIP'$ (procalcitonin:4,lacticidven:4)  ) Recent Results (from the past 240 hour(s))  Resp Panel by RT-PCR (Flu A&B, Covid) Anterior Nasal Swab     Status: None   Collection Time: 07/08/22  9:15 AM   Specimen: Anterior Nasal Swab  Result Value Ref Range Status   SARS Coronavirus 2 by RT PCR NEGATIVE NEGATIVE Final    Comment: (NOTE) SARS-CoV-2 target nucleic acids are NOT DETECTED.  The SARS-CoV-2 RNA is generally detectable in upper respiratory specimens during the acute phase of infection. The lowest concentration of SARS-CoV-2 viral copies this assay can detect is 138 copies/mL. A negative result does not preclude SARS-Cov-2 infection and should not be used as the sole basis for treatment or other patient management decisions. A negative result may occur with  improper specimen collection/handling, submission of specimen other than nasopharyngeal swab, presence of viral mutation(s) within the areas targeted by this assay, and inadequate number of viral copies(<138 copies/mL).  A negative result must be combined with clinical observations, patient history, and epidemiological information. The expected result is Negative.  Fact Sheet for Patients:  EntrepreneurPulse.com.au  Fact Sheet for Healthcare Providers:  IncredibleEmployment.be  This test is no t yet approved or cleared by the [place] FDA and  has been authorized for detection and/or diagnosis of SARS-CoV-2 by FDA under an Emergency Use Authorization (EUA). This EUA will remain  in effect (meaning this test can be used) for the duration of the COVID-19 declaration under Section 564(b)(1) of the Act, 21 U.S.C.section 360bbb-3(b)(1), unless the authorization is terminated  or revoked sooner.       Influenza A by PCR NEGATIVE NEGATIVE Final   Influenza B by PCR NEGATIVE NEGATIVE Final    Comment: (NOTE) The Xpert Xpress SARS-CoV-2/FLU/RSV  plus assay is intended as an aid in the diagnosis of influenza from Nasopharyngeal swab specimens and should not be used as a sole basis for treatment. Nasal washings and aspirates are unacceptable for Xpert Xpress SARS-CoV-2/FLU/RSV testing.  Fact Sheet for Patients: EntrepreneurPulse.com.au  Fact Sheet for Healthcare Providers: IncredibleEmployment.be  This test is not yet approved or cleared by the [place] FDA and has been authorized for detection and/or diagnosis of SARS-CoV-2 by FDA under an Emergency Use Authorization (EUA). This EUA will remain in effect (meaning this test can be used) for the duration of the COVID-19 declaration under Section 564(b)(1) of the Act, 21 U.S.C. section 360bbb-3(b)(1), unless the authorization is terminated or revoked.  Performed at [facility], [address] [place], [place] [address]   Blood Culture (routine x 2)     Status: None (Preliminary result)   Collection Time: 07/08/22  9:15 AM   Specimen: BLOOD  Result Value Ref Range Status   Specimen Description BLOOD BLOOD LEFT WRIST  Final   Special Requests   Final    BOTTLES DRAWN AEROBIC AND ANAEROBIC Blood Culture adequate volume   Culture  Setup Time   Final    GRAM NEGATIVE RODS IN BOTH AEROBIC AND ANAEROBIC BOTTLES Organism ID to follow CRITICAL RESULT CALLED TO, READ BACK BY AND VERIFIED WITH: Violeta Gelinas 07/09/22 0703 MW Performed at [facility], [address] [place], [place] [address]    Culture GRAM NEGATIVE RODS  Final   Report Status PENDING  Incomplete  Blood Culture (routine x 2)     Status: None (Preliminary result)   Collection Time: 07/08/22  9:15 AM   Specimen: BLOOD  Result Value Ref Range Status   Specimen Description BLOOD BLOOD RIGHT FOREARM  Final   Special Requests   Final    BOTTLES DRAWN AEROBIC AND ANAEROBIC Blood Culture results may not be optimal due to an excessive volume of blood  received in culture bottles   Culture  Setup Time   Final    GRAM NEGATIVE RODS ANAEROBIC BOTTLE ONLY CRITICAL RESULT CALLED TO, READ BACK BY AND VERIFIED WITH: Violeta Gelinas 07/09/22 0703 MW Performed at [facility], [address] [place], [place] [address]    Culture GRAM NEGATIVE RODS  Final   Report Status PENDING  Incomplete  Blood Culture ID Panel (Reflexed)     Status: Abnormal   Collection Time: 07/08/22  9:15 AM  Result Value Ref Range Status   Enterococcus faecalis NOT DETECTED NOT DETECTED Final   Enterococcus Faecium NOT DETECTED NOT DETECTED Final   Listeria monocytogenes NOT DETECTED NOT DETECTED Final   Staphylococcus species NOT DETECTED NOT DETECTED Final  Staphylococcus aureus (BCID) NOT DETECTED NOT DETECTED Final   Staphylococcus epidermidis NOT DETECTED NOT DETECTED Final   Staphylococcus lugdunensis NOT DETECTED NOT DETECTED Final   Streptococcus species NOT DETECTED NOT DETECTED Final   Streptococcus agalactiae NOT DETECTED NOT DETECTED Final   Streptococcus pneumoniae NOT DETECTED NOT DETECTED Final   Streptococcus pyogenes NOT DETECTED NOT DETECTED Final   A.calcoaceticus-baumannii NOT DETECTED NOT DETECTED Final   Bacteroides fragilis NOT DETECTED NOT DETECTED Final   Enterobacterales DETECTED (A) NOT DETECTED Final    Comment: Enterobacterales represent a large order of gram negative bacteria, not a single organism. Refer to culture for further identification. CRITICAL RESULT CALLED TO, READ BACK BY AND VERIFIED WITH: JASON ROBBINS 07/09/22 0703 MW    Enterobacter cloacae complex NOT DETECTED NOT DETECTED Final   Escherichia coli NOT DETECTED NOT DETECTED Final   Klebsiella aerogenes NOT DETECTED NOT DETECTED Final   Klebsiella oxytoca NOT DETECTED NOT DETECTED Final   Klebsiella pneumoniae NOT DETECTED NOT DETECTED Final   Proteus species NOT DETECTED NOT DETECTED Final   Salmonella species NOT DETECTED NOT DETECTED Final   Serratia  marcescens NOT DETECTED NOT DETECTED Final   Haemophilus influenzae NOT DETECTED NOT DETECTED Final   Neisseria meningitidis NOT DETECTED NOT DETECTED Final   Pseudomonas aeruginosa NOT DETECTED NOT DETECTED Final   Stenotrophomonas maltophilia NOT DETECTED NOT DETECTED Final   Candida albicans NOT DETECTED NOT DETECTED Final   Candida auris NOT DETECTED NOT DETECTED Final   Candida glabrata NOT DETECTED NOT DETECTED Final   Candida krusei NOT DETECTED NOT DETECTED Final   Candida parapsilosis NOT DETECTED NOT DETECTED Final   Candida tropicalis NOT DETECTED NOT DETECTED Final   Cryptococcus neoformans/gattii NOT DETECTED NOT DETECTED Final   CTX-M ESBL NOT DETECTED NOT DETECTED Final   Carbapenem resistance IMP NOT DETECTED NOT DETECTED Final   Carbapenem resistance KPC NOT DETECTED NOT DETECTED Final   Carbapenem resistance NDM NOT DETECTED NOT DETECTED Final   Carbapenem resist OXA 48 LIKE NOT DETECTED NOT DETECTED Final   Carbapenem resistance VIM NOT DETECTED NOT DETECTED Final    Comment: Performed at [facility], [address] [place], [place] [address]  Respiratory (~20 pathogens) panel by PCR     Status: None   Collection Time: 07/08/22  1:00 PM   Specimen: Nasopharyngeal Swab; Respiratory  Result Value Ref Range Status   Adenovirus NOT DETECTED NOT DETECTED Final   Coronavirus 229E NOT DETECTED NOT DETECTED Final    Comment: (NOTE) The Coronavirus on the Respiratory Panel, DOES NOT test for the novel  Coronavirus (2019 nCoV)    Coronavirus HKU1 NOT DETECTED NOT DETECTED Final   Coronavirus NL63 NOT DETECTED NOT DETECTED Final   Coronavirus OC43 NOT DETECTED NOT DETECTED Final   Metapneumovirus NOT DETECTED NOT DETECTED Final   Rhinovirus / Enterovirus NOT DETECTED NOT DETECTED Final   Influenza A NOT DETECTED NOT DETECTED Final   Influenza B NOT DETECTED NOT DETECTED Final   Parainfluenza Virus 1 NOT DETECTED NOT DETECTED Final   Parainfluenza Virus 2 NOT  DETECTED NOT DETECTED Final   Parainfluenza Virus 3 NOT DETECTED NOT DETECTED Final   Parainfluenza Virus 4 NOT DETECTED NOT DETECTED Final   Respiratory Syncytial Virus NOT DETECTED NOT DETECTED Final   Bordetella pertussis NOT DETECTED NOT DETECTED Final   Bordetella Parapertussis NOT DETECTED NOT DETECTED Final   Chlamydophila pneumoniae NOT DETECTED NOT DETECTED Final   Mycoplasma pneumoniae NOT DETECTED NOT DETECTED Final  Comment: Performed at [facility], [place] [address], [place], [place] [address]         Radiology Studies: US Venous Img Lower Bilateral (DVT)  Result Date: 07/08/2022 CLINICAL DATA:  Short of breath and fever.  Leg swelling EXAM: BILATERAL LOWER EXTREMITY VENOUS DOPPLER ULTRASOUND TECHNIQUE: Gray-scale sonography with graded compression, as well as color Doppler and duplex ultrasound were performed to evaluate the lower extremity deep venous systems from the level of the common femoral vein and including the common femoral, femoral, profunda femoral, popliteal and calf veins including the posterior tibial, peroneal and gastrocnemius veins when visible. The superficial great saphenous vein was also interrogated. Spectral Doppler was utilized to evaluate flow at rest and with distal augmentation maneuvers in the common femoral, femoral and popliteal veins. COMPARISON:  None Available. FINDINGS: RIGHT LOWER EXTREMITY Common Femoral Vein: No evidence of thrombus. Normal compressibility, respiratory phasicity and response to augmentation. Saphenofemoral Junction: No evidence of thrombus. Normal compressibility and flow on color Doppler imaging. Profunda Femoral Vein: No evidence of thrombus. Normal compressibility and flow on color Doppler imaging. Femoral Vein: No evidence of thrombus. Normal compressibility, respiratory phasicity and response to augmentation. Popliteal Vein: No evidence of thrombus. Normal compressibility, respiratory phasicity and response to  augmentation. Calf Veins: No evidence of thrombus. Normal compressibility and flow on color Doppler imaging. Superficial Great Saphenous Vein: No evidence of thrombus. Normal compressibility. Venous Reflux:  None. Other Findings:  None. LEFT LOWER EXTREMITY Common Femoral Vein: No evidence of thrombus. Normal compressibility, respiratory phasicity and response to augmentation. Saphenofemoral Junction: No evidence of thrombus. Normal compressibility and flow on color Doppler imaging. Profunda Femoral Vein: No evidence of thrombus. Normal compressibility and flow on color Doppler imaging. Femoral Vein: No evidence of thrombus. Normal compressibility, respiratory phasicity and response to augmentation. Popliteal Vein: No evidence of thrombus. Normal compressibility, respiratory phasicity and response to augmentation. Calf Veins: No evidence of thrombus. Normal compressibility and flow on color Doppler imaging. Superficial Great Saphenous Vein: No evidence of thrombus. Normal compressibility. Venous Reflux:  None. Other Findings:  None. IMPRESSION: No evidence of deep venous thrombosis in either lower extremity. Electronically Signed   By: Franchot Gallo M.D.   On: 07/08/2022 15:44   CT Angio Chest Pulmonary Embolism (PE) W or WO Contrast  Result Date: 07/08/2022 CLINICAL DATA:  Shortness of breath, fever and confusion. EXAM: CT ANGIOGRAPHY CHEST WITH CONTRAST TECHNIQUE: Multidetector CT imaging of the chest was performed using the standard protocol during bolus administration of intravenous contrast. Multiplanar CT image reconstructions and MIPs were obtained to evaluate the vascular anatomy. RADIATION DOSE REDUCTION: This exam was performed according to the departmental dose-optimization program which includes automated exposure control, adjustment of the mA and/or kV according to patient size and/or use of iterative reconstruction technique. CONTRAST:  31m OMNIPAQUE IOHEXOL 350 MG/ML SOLN COMPARISON:  None  Available. FINDINGS: Cardiovascular: The heart is normal in size. No pericardial effusion. The thoracic aorta is normal in caliber. No dissection. Enlargement of the pulmonary arterial trunk and right and left main pulmonary arteries possibly suggesting pulmonary hypertension. The pulmonary arteries are well opacified. No filling defects to suggest pulmonary embolism. Mediastinum/Nodes: Borderline enlarged mediastinal hilar lymph nodes. The esophagus is grossly no. Lungs/Pleura: Bibasilar subpleural atelectasis. No pulmonary infiltrates or pleural effusions. No worrisome pulmonary lesions or pulmonary nodules. The central tracheobronchial tree is unremarkable. Upper Abdomen: No significant upper abdominal findings Musculoskeletal: No breast masses, supraclavicular or axillary adenopathy. The bony thorax is intact. Cervical fusion hardware is  noted. Multilevel thoracic Schmorl's nodes. Review of the MIP images confirms the above findings. IMPRESSION: 1. No CT findings for pulmonary embolism. 2. Normal thoracic aorta. 3. Enlargement of the pulmonary arterial trunk and right and left main pulmonary arteries possibly suggesting pulmonary hypertension. 4. Borderline mediastinal hilar lymph nodes. Recommend follow-up chest CT in 4 months to re-evaluate. 5. Bibasilar subpleural atelectasis but no pulmonary infiltrates or pleural effusions. 6. No worrisome pulmonary lesions. Electronically Signed   By: Marijo Sanes M.D.   On: 07/08/2022 14:43   MR Lumbar Spine W Wo Contrast  Result Date: 07/08/2022 CLINICAL DATA:  Back pain.  Fever and confusion. EXAM: MRI LUMBAR SPINE WITHOUT AND WITH CONTRAST TECHNIQUE: Multiplanar and multiecho pulse sequences of the lumbar spine were obtained without and with intravenous contrast. CONTRAST:  16m GADAVIST GADOBUTROL 1 MMOL/ML IV SOLN COMPARISON:  Radiography 12/12/2015.  MRI 01/04/2014. FINDINGS: Segmentation:  S1 is a transitional vertebra as numbered previously. Alignment: 1-2 mm  of degenerative anterolisthesis at L5-S1 and S1-S2. Vertebrae:  No fracture or focal bone lesion. Conus medullaris and cauda equina: Conus extends to the L1-2 level. Conus and cauda equina appear normal. Paraspinal and other soft tissues: Negative Disc levels: L1-2 and L2-3: Normal L3-4: Minimal disc bulge.  No stenosis. L4-5: Minimal disc bulge.  No stenosis. L5-S1: Bilateral facet arthropathy with 1-2 mm of anterolisthesis. Mild bulging of the disc. Small synovial cyst associated with the right facet joint. Stenosis of both lateral recesses that could possibly be symptomatic. Edematous and enhancing changes of the facet joints could relate to back pain or referred facet syndrome pain. The appearance does not look like infectious disease. 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 Minimal bulging of the disc. No compressive stenosis. IMPRESSION: No sign of infection in the lumbar or upper sacral region. S1 is a transitional vertebra. Worsening of bilateral facet degeneration at L5-S1. 1-2 mm of anterolisthesis. Bulging of the disc. Mild stenosis of the lateral recesses that could possibly be symptomatic. Edema and enhancement of the facet joints that could relate to back pain or referred facet syndrome pain. The disease does not look like infectious inflammatory disease however. S1-2 shows chronic facet arthritis with 1-2 mm of anterolisthesis. No stenosis. Electronically Signed   By: MNelson ChimesM.D.   On: 07/08/2022 14:33   MR THORACIC SPINE W WO CONTRAST  Result Date: 07/08/2022 CLINICAL DATA:  Mid back pain EXAM: MRI THORACIC WITHOUT AND WITH CONTRAST TECHNIQUE: Multiplanar and multiecho pulse sequences of the thoracic spine were obtained without and with intravenous contrast. CONTRAST:  672mGADAVIST GADOBUTROL 1 MMOL/ML IV SOLN COMPARISON:  Thoracic spine radiographs 07/12/2009 FINDINGS: Alignment:  Normal alignment.  Mild scoliosis. Vertebrae: Negative for fracture or  mass. Disc degeneration and multiple Schmorl's nodes in the mid and lower thoracic spine. Cord:  Normal signal and morphology Paraspinal and other soft tissues: No paraspinous mass or fluid collection Disc levels: Multilevel disc degeneration in the thoracic spine with Schmorl's nodes and mild spurring. Small central disc protrusion T5-6. Negative for thoracic spinal stenosis. IMPRESSION: Thoracic disc degeneration.  No significant stenosis Negative for fracture Electronically Signed   By: ChFranchot Gallo.D.   On: 07/08/2022 14:32   MR Cervical Spine W or Wo Contrast  Result Date: 07/08/2022 CLINICAL DATA:  Neck pain.  Fever and confusion. EXAM: MRI CERVICAL SPINE WITHOUT AND WITH CONTRAST TECHNIQUE: Multiplanar and multiecho pulse sequences of the cervical spine, to include the craniocervical junction and cervicothoracic junction, were obtained  without and with intravenous contrast. CONTRAST:  29m GADAVIST GADOBUTROL 1 MMOL/ML IV SOLN COMPARISON:  Radiography 12/12/2015.  CT 11/19/2015. FINDINGS: Alignment: No significant malalignment. 1-2 mm of degenerative anterolisthesis at C7-T1. Vertebrae: Previous ACDF C4 through C7. No finding to suggest spinal infection. Cord: No cord compression or focal cord lesion. Posterior Fossa, vertebral arteries, paraspinal tissues: Negative Disc levels: Foramen magnum and C1-2 are normal. C2-3: Chronic facet fusion on the right. Wide patency of the canal and foramina. C3-4: Endplate osteophytes and bulging of the disc. No central canal stenosis. Mild bony foraminal narrowing on the left. C4 through C7: Previous ACDF has a good appearance with apparent solid union and wide patency of the canal and foramina. C7-T1: Bilateral facet osteoarthritis with 1-2 mm of anterolisthesis. No compressive narrowing of the canal or foramina. IMPRESSION: No sign of infection in the cervical region. Previous ACDF C4 through C7. Solid union with wide patency of the canal and foramina. C7-T1 facet  arthritis with 1-2 mm of anterolisthesis. No compressive stenosis. Mild adjacent segment degenerative changes C3-4 without compressive stenosis. Chronic facet joint fusion on the right at C2-3. Electronically Signed   By: MNelson ChimesM.D.   On: 07/08/2022 14:29   CT HEAD WO CONTRAST (5MM)  Result Date: 07/08/2022 CLINICAL DATA:  Head injury. EXAM: CT HEAD WITHOUT CONTRAST TECHNIQUE: Contiguous axial images were obtained from the base of the skull through the vertex without intravenous contrast. RADIATION DOSE REDUCTION: This exam was performed according to the departmental dose-optimization program which includes automated exposure control, adjustment of the mA and/or kV according to patient size and/or use of iterative reconstruction technique. COMPARISON:  None Available. FINDINGS: Brain: No evidence of acute infarction, hemorrhage, hydrocephalus, extra-axial collection or mass lesion/mass effect. Vascular: No hyperdense vessel or unexpected calcification. Skull: Normal. Negative for fracture or focal lesion. Sinuses/Orbits: No acute finding. Other: None. IMPRESSION: No acute intracranial abnormality seen. Electronically Signed   By: JMarijo ConceptionM.D.   On: 07/08/2022 11:15   DG Chest Port 1 View  Result Date: 07/08/2022 CLINICAL DATA:  Question sepsis EXAM: PORTABLE CHEST 1 VIEW COMPARISON:  02/23/2022 FINDINGS: The heart size and mediastinal contours are within normal limits. Both lungs are clear. The visualized skeletal structures are unremarkable. IMPRESSION: No active disease. Electronically Signed   By: MNelson ChimesM.D.   On: 07/08/2022 09:56        Scheduled Meds:  amphetamine-dextroamphetamine  10 mg Oral BID   cariprazine  4.5 mg Oral Daily   DULoxetine  30 mg Oral BID   enoxaparin (LOVENOX) injection  40 mg Subcutaneous Q24H   famotidine  20 mg Oral BID   folic acid  1 mg Oral Daily   gabapentin  300 mg Oral TID   influenza vac split quadrivalent PF  0.5 mL Intramuscular  Tomorrow-1000   LORazepam  0-4 mg Oral Q6H   Followed by   [Derrill MemoON 07/10/2022] LORazepam  0-4 mg Oral Q12H   multivitamin with minerals  1 tablet Oral Daily   pneumococcal 20-valent conjugate vaccine  0.5 mL Intramuscular Tomorrow-1000   thiamine  100 mg Oral Daily   Or   thiamine  100 mg Intravenous Daily   Continuous Infusions:  ceFEPime (MAXIPIME) IV Stopped (07/08/22 2105)     LOS: 1 day     NDesma Maxim MD [facility]   If 7PM-7AM, please contact night-coverage [email or web address] Password [email or web address]9/14/2023, 9:48 AM

## 2022-07-09 NOTE — TOC Initial Note (Signed)
Transition of Care [facility]) - Initial/Assessment Note    Patient Details  Name: [name] MRN: [id number] Date of Birth: October 25, 1966  Transition of Care [facility]) CM/SW Contact:    Laurena Slimmer, RN Phone Number: 07/09/2022, 9:47 PM  Clinical Narrative:                  Spoke with patient at bedisde. Patient receptive and provided SA resources.        Patient Goals and CMS Choice        Expected Discharge Plan and Services                                                Prior Living Arrangements/Services                       Activities of Daily Living Home Assistive Devices/Equipment: None ADL Screening (condition at time of admission) Patient's cognitive ability adequate to safely complete daily activities?: Yes Is the patient deaf or have difficulty hearing?: Yes Does the patient have difficulty seeing, even when wearing glasses/contacts?: No Does the patient have difficulty concentrating, remembering, or making decisions?: Yes Patient able to express need for assistance with ADLs?: Yes Does the patient have difficulty dressing or bathing?: No Independently performs ADLs?: Yes (appropriate for developmental age) Does the patient have difficulty walking or climbing stairs?: Yes Weakness of Legs: Both Weakness of Arms/Hands: None  Permission Sought/Granted                  Emotional Assessment              Admission diagnosis:  Shortness of breath [R06.02] Lactic acidosis [E87.20] Bilateral leg pain [M79.604, M79.605] Fever [R50.9] Fever, unspecified fever cause [R50.9] Sepsis without acute organ dysfunction, due to unspecified organism [facility]) [A41.9] Patient Active Problem List   Diagnosis Date Noted   Opioid dependence ([place]) 07/09/2022   IVDU (intravenous drug user) 07/09/2022   Bacteremia 07/09/2022   Fever 07/08/2022   Back pain 07/08/2022   Sepsis ([place]) 07/08/2022   AKI (acute kidney injury) ([place]) 07/08/2022    Gastroenteritis 03/31/2021   COVID-19 03/31/2021   Protein-calorie malnutrition, severe 12/31/2017   Abdominal pain 12/30/2017   Acute blood loss anemia 12/17/2017   S/P laparoscopic hysterectomy 12/15/2017   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   Hypotension 09/12/2016   Right facial numbness 09/11/2016   Stroke-like symptom 09/11/2016   Seizure ([place]) 09/11/2016   Stroke-like symptoms 09/11/2016   Fall    Syncope 11/20/2015   Scalp laceration 11/20/2015   Major depressive disorder, 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   DDD (degenerative disc disease)    ACUTE BRONCHITIS 10/22/2009   GERD 08/02/2009   DEGENERATIVE [place] DISEASE 08/02/2009   COUGH 08/02/2009   Bipolar disorder ([place]) 08/01/2009   Alcoholism ([place]) 08/01/2009   BULIMIA 08/01/2009   HEARING LOSS, BILATERAL 08/01/2009   OSTEOPENIA 08/01/2009   SEIZURE DISORDER 08/01/2009   PCP:  Sandi Mariscal, MD Pharmacy:   [facility] [phone] [address], [place] AT [facility] OF [address] & [address] [place] [address] Phone: [phone] Fax: [phone]     Social Determinants of Health (SDOH) Interventions    Readmission Risk Interventions     No data to display

## 2022-07-09 NOTE — Progress Notes (Signed)
   07/09/22 1000  Clinical Encounter Type  Visited With Patient  Visit Type Initial  Referral From Nurse  Consult/Referral To Chaplain  Spiritual Encounters  Spiritual Needs Prayer   Chaplain responded to [facility] consult to provide information on HCPOA. Patient declined at this time.

## 2022-07-10 DIAGNOSIS — R7881 Bacteremia: Secondary | ICD-10-CM | POA: Diagnosis not present

## 2022-07-10 DIAGNOSIS — A419 Sepsis, unspecified organism: Secondary | ICD-10-CM | POA: Diagnosis not present

## 2022-07-10 LAB — CORTISOL-AM, BLOOD: Cortisol - AM: 5.5 ug/dL — ABNORMAL LOW (ref 6.7–22.6)

## 2022-07-10 LAB — ECHOCARDIOGRAM COMPLETE BUBBLE STUDY
AR max vel: 2.28 cm2
AV Area VTI: 2.3 cm2
AV Area mean vel: 2.2 cm2
AV Mean grad: 3 mmHg
AV Peak grad: 5.3 mmHg
Ao pk vel: 1.15 m/s
Area-P 1/2: 4.86 cm2
P 1/2 time: 399 msec
S' Lateral: 2.8 cm

## 2022-07-10 LAB — HEPATITIS B SURFACE ANTIGEN: Hepatitis B Surface Ag: NONREACTIVE

## 2022-07-10 LAB — RPR: RPR Ser Ql: NONREACTIVE

## 2022-07-10 LAB — TSH: TSH: 1.27 u[IU]/mL (ref 0.350–4.500)

## 2022-07-10 LAB — GLUCOSE, CAPILLARY: Glucose-Capillary: 91 mg/dL (ref 70–99)

## 2022-07-10 MED ORDER — COSYNTROPIN 0.25 MG IJ SOLR
0.2500 mg | Freq: Once | INTRAMUSCULAR | Status: AC
  Administered 2022-07-11: 0.25 mg via INTRAVENOUS
  Filled 2022-07-10: qty 0.25

## 2022-07-10 NOTE — Progress Notes (Signed)
PROGRESS NOTE    [name]  TJQ:[id number] DOB: Mar 02, 1966 DOA: 07/08/2022 PCP: Sandi Mariscal, MD      Brief Narrative:   From admission h and p [name] is a 56 y.o. female with medical history significant for Bipolar disorder, anxiety, depression, thyroid disease who presents to the ER for evaluation of fever and shortness of breath. Patient states that she has had symptoms intermittently over months which she describes as fever, chills, generalized fatigue and confusion.  She also complains of worsening low back pain mostly in the lower lumbar region.  She has a history of chronic back pain and states that it is worse today.  She rates her pain an 8 x 10 in intensity at its worst and denies having any associated lower extremity weakness, no urinary or fecal incontinence and denies having saddle anesthesia.  She denies any recent trauma.  She complains of shortness of breath but denies having any cough or chest pain. She denies having any nausea, no vomiting, no abdominal pain, no dizziness, no lightheadedness, no headache, no blurred vision no focal deficit. She had a fever with a Tmax of 103 upon arrival to the ER, she was tachycardic and tachypneic.  Lactic acid was elevated at 2.8.  Slightly elevated AST level at 63 and an AKI with increase in her serum creatinine from 0.9-1.3.   Assessment & Plan:   Principal Problem:   Bacteremia Active Problems:   Sepsis ([place])   Back pain   AKI (acute kidney injury) ([place])   Bipolar disorder ([place])   Alcoholism ([place])   GERD   Opioid dependence ([place])   IVDU (intravenous drug user)  # Sepsis 2/2 bacteremia, by fever and tachycardia and lactic acidosis. Lactate normalized w/ fluids, hemodynamically stable. Sepsis phsyiology resolved.  # Gram negative rod bacteremia Secondary to IVDU, no other clear sources of infection. TTE no vegetations - cont cefepime, culture results pending - cardiology consulted for TEE - ID  following  # IVDU Did not admit to this on presentation but now admits to occasional IVDU (opioids) - monitor for s/s withdrawal - f/u rpr, hiv, hcv, hbv - TOC consulted  # Low cortisol - will check acth stim tomorrow  # Joint pain Reports of joint pain and swelling multiple finger joints and legs. Nothing obvious on exam - f/u ana, rf  # Chronic pain # Opioid dependence - cont home oxy, gabapentin, flexeril  # Bipolar - cont home cariprazine  # ADHD - cont home adderall  # Alcohol use Reports couple of drinks a day, low risk for etoh withdrawal if this triw - monitor on ciwa - vitamins   DVT prophylaxis: lovenox Code Status: full Family Communication: none @ bedside  Level of care: Telemetry Medical Status is: Inpatient Remains inpatient appropriate because: severity of illness    Consultants:  ID  Procedures: none  Antimicrobials:  Vanc/cefepime/flagyl>cefepime    Subjective: Feels well, no fevers or chills  Objective: Vitals:   07/09/22 2024 07/10/22 0004 07/10/22 0417 07/10/22 0803  BP: 137/74 129/88 122/83 (!) 135/91  Pulse: 74 80 74 69  Resp: '18 16 17 17  '$ Temp: 97.8 F (36.6 C) 97.7 F (36.5 C) 98 F (36.7 C) 97.7 F (36.5 C)  TempSrc:  Oral Oral   SpO2: 98% 95% 96% 96%  Weight:      Height:        Intake/Output Summary (Last 24 hours) at 07/10/2022 1409 Last data filed at 07/09/2022 1900 Gross per  24 hour  Intake 120 ml  Output --  Net 120 ml   Filed Weights   07/08/22 1400  Weight: 57.6 kg    Examination:  General exam: Appears calm and comfortable  Respiratory system: Clear to auscultation. Respiratory effort normal. Cardiovascular system: S1 & S2 heard, RRR. No JVD, murmurs, rubs, gallops or clicks. No pedal edema. Gastrointestinal system: Abdomen is nondistended, soft and nontender. No organomegaly or masses felt. Normal bowel sounds heard. Central nervous system: Alert and oriented. No focal neurological  deficits. Extremities: Symmetric 5 x 5 power. Skin: No rashes, lesions or ulcers Psychiatry: Judgement and insight appear normal. Mood & affect appropriate.     Data Reviewed: I have personally reviewed following labs and imaging studies  CBC: Recent Labs  Lab 07/08/22 0915  WBC 5.3  NEUTROABS 4.4  HGB 11.0*  HCT 33.7*  MCV 89.2  PLT 193*   Basic Metabolic Panel: Recent Labs  Lab 07/08/22 0915 07/09/22 0436  NA 140 143  K 3.7 3.9  CL 109 111  CO2 22 26  GLUCOSE 99 93  BUN 19 14  CREATININE 1.30* 0.96  CALCIUM 9.0 8.7*   GFR: Estimated Creatinine Clearance: 52.8 mL/min (by C-G formula based on SCr of 0.96 mg/dL). Liver Function Tests: Recent Labs  Lab 07/08/22 0915 07/09/22 0436  AST 63* 36  ALT 32 24  ALKPHOS 172* 94  BILITOT 0.6 0.7  PROT 6.4* 5.8*  ALBUMIN 3.2* 2.7*   No results for input(s): "LIPASE", "AMYLASE" in the last 168 hours. No results for input(s): "AMMONIA" in the last 168 hours. Coagulation Profile: Recent Labs  Lab 07/08/22 0915 07/09/22 0436  INR 0.9 1.0   Cardiac Enzymes: No results for input(s): "CKTOTAL", "CKMB", "CKMBINDEX", "TROPONINI" in the last 168 hours. BNP (last 3 results) No results for input(s): "PROBNP" in the last 8760 hours. HbA1C: No results for input(s): "HGBA1C" in the last 72 hours. CBG: Recent Labs  Lab 07/09/22 1243 07/09/22 2032  GLUCAP 99 112*   Lipid Profile: No results for input(s): "CHOL", "HDL", "LDLCALC", "TRIG", "CHOLHDL", "LDLDIRECT" in the last 72 hours. Thyroid Function Tests: Recent Labs    07/10/22 0450  TSH 1.270   Anemia Panel: No results for input(s): "VITAMINB12", "FOLATE", "FERRITIN", "TIBC", "IRON", "RETICCTPCT" in the last 72 hours. Urine analysis:    Component Value Date/Time   COLORURINE YELLOW (A) 07/08/2022 0920   APPEARANCEUR HAZY (A) 07/08/2022 0920   LABSPEC 1.019 07/08/2022 0920   PHURINE 5.0 07/08/2022 0920   GLUCOSEU NEGATIVE 07/08/2022 0920   HGBUR SMALL (A)  07/08/2022 0920   BILIRUBINUR NEGATIVE 07/08/2022 0920   KETONESUR NEGATIVE 07/08/2022 0920   PROTEINUR 100 (A) 07/08/2022 0920   UROBILINOGEN 0.2 05/26/2015 0722   NITRITE NEGATIVE 07/08/2022 0920   LEUKOCYTESUR NEGATIVE 07/08/2022 0920   Sepsis Labs: '@LABRCNTIP'$ (procalcitonin:4,lacticidven:4)  ) Recent Results (from the past 240 hour(s))  Resp Panel by RT-PCR (Flu A&B, Covid) Anterior Nasal Swab     Status: None   Collection Time: 07/08/22  9:15 AM   Specimen: Anterior Nasal Swab  Result Value Ref Range Status   SARS Coronavirus 2 by RT PCR NEGATIVE NEGATIVE Final    Comment: (NOTE) SARS-CoV-2 target nucleic acids are NOT DETECTED.  The SARS-CoV-2 RNA is generally detectable in upper respiratory specimens during the acute phase of infection. The lowest concentration of SARS-CoV-2 viral copies this assay can detect is 138 copies/mL. A negative result does not preclude SARS-Cov-2 infection and should not be used as the sole basis  for treatment or other patient management decisions. A negative result may occur with  improper specimen collection/handling, submission of specimen other than nasopharyngeal swab, presence of viral mutation(s) within the areas targeted by this assay, and inadequate number of viral copies(<138 copies/mL). A negative result must be combined with clinical observations, patient history, and epidemiological information. The expected result is Negative.  Fact Sheet for Patients:  EntrepreneurPulse.com.au  Fact Sheet for Healthcare Providers:  IncredibleEmployment.be  This test is no t yet approved or cleared by the [place] FDA and  has been authorized for detection and/or diagnosis of SARS-CoV-2 by FDA under an Emergency Use Authorization (EUA). This EUA will remain  in effect (meaning this test can be used) for the duration of the COVID-19 declaration under Section 564(b)(1) of the Act, 21 U.S.C.section  360bbb-3(b)(1), unless the authorization is terminated  or revoked sooner.       Influenza A by PCR NEGATIVE NEGATIVE Final   Influenza B by PCR NEGATIVE NEGATIVE Final    Comment: (NOTE) The Xpert Xpress SARS-CoV-2/FLU/RSV plus assay is intended as an aid in the diagnosis of influenza from Nasopharyngeal swab specimens and should not be used as a sole basis for treatment. Nasal washings and aspirates are unacceptable for Xpert Xpress SARS-CoV-2/FLU/RSV testing.  Fact Sheet for Patients: EntrepreneurPulse.com.au  Fact Sheet for Healthcare Providers: IncredibleEmployment.be  This test is not yet approved or cleared by the [place] FDA and has been authorized for detection and/or diagnosis of SARS-CoV-2 by FDA under an Emergency Use Authorization (EUA). This EUA will remain in effect (meaning this test can be used) for the duration of the COVID-19 declaration under Section 564(b)(1) of the Act, 21 U.S.C. section 360bbb-3(b)(1), unless the authorization is terminated or revoked.  Performed at [facility], [address] [place], [place] [address]   Blood Culture (routine x 2)     Status: None (Preliminary result)   Collection Time: 07/08/22  9:15 AM   Specimen: BLOOD  Result Value Ref Range Status   Specimen Description   Final    BLOOD BLOOD LEFT WRIST Performed at [facility], [address], [place], [place] [address]    Special Requests   Final    BOTTLES DRAWN AEROBIC AND ANAEROBIC Blood Culture adequate volume Performed at [facility], [address], [place], [place] [address]    Culture  Setup Time   Final    GRAM NEGATIVE RODS IN BOTH AEROBIC AND ANAEROBIC BOTTLES CRITICAL RESULT CALLED TO, READ BACK BY AND VERIFIED WITH: JASON ROBBINS 07/09/22 0703 MW    Culture   Final    GRAM NEGATIVE RODS IDENTIFICATION AND SUSCEPTIBILITIES TO FOLLOW Performed at [facility], [place] [address], [place], [place] [address]    Report Status PENDING  Incomplete  Blood Culture (routine x 2)     Status: None (Preliminary result)   Collection Time: 07/08/22  9:15 AM   Specimen: BLOOD  Result Value Ref Range Status   Specimen Description   Final    BLOOD BLOOD RIGHT FOREARM Performed at [facility], [address], [place], [place] [address]    Special Requests   Final    BOTTLES DRAWN AEROBIC AND ANAEROBIC Blood Culture results may not be optimal due to an excessive volume of blood received in culture bottles Performed at [facility], [address] [place], [place] [address]    Culture  Setup Time   Final    GRAM NEGATIVE RODS  IN BOTH AEROBIC AND ANAEROBIC BOTTLES CRITICAL RESULT CALLED TO, READ BACK BY AND VERIFIED WITH: Violeta Gelinas 07/09/22 0703 MW Performed at [facility], [address] [place], [place] [address]    Culture   Final    Lonell Grandchild NEGATIVE RODS IDENTIFICATION TO FOLLOW Performed at [facility], [place] [address] [place], [place] [address]    Report Status PENDING  Incomplete  Blood Culture ID Panel (Reflexed)     Status: Abnormal   Collection Time: 07/08/22  9:15 AM  Result Value Ref Range Status   Enterococcus faecalis NOT DETECTED NOT DETECTED Final   Enterococcus Faecium NOT DETECTED NOT DETECTED Final   Listeria monocytogenes NOT DETECTED NOT DETECTED Final   Staphylococcus species NOT DETECTED NOT DETECTED Final   Staphylococcus aureus (BCID) NOT DETECTED NOT DETECTED Final   Staphylococcus epidermidis NOT DETECTED NOT DETECTED Final   Staphylococcus lugdunensis NOT DETECTED NOT DETECTED Final   Streptococcus species NOT DETECTED NOT DETECTED Final   Streptococcus agalactiae NOT DETECTED NOT DETECTED Final   Streptococcus pneumoniae NOT DETECTED NOT DETECTED Final   Streptococcus pyogenes NOT DETECTED NOT DETECTED Final   A.calcoaceticus-baumannii NOT DETECTED NOT DETECTED Final   Bacteroides fragilis NOT  DETECTED NOT DETECTED Final   Enterobacterales DETECTED (A) NOT DETECTED Final    Comment: Enterobacterales represent a large order of gram negative bacteria, not a single organism. Refer to culture for further identification. CRITICAL RESULT CALLED TO, READ BACK BY AND VERIFIED WITH: JASON ROBBINS 07/09/22 0703 MW    Enterobacter cloacae complex NOT DETECTED NOT DETECTED Final   Escherichia coli NOT DETECTED NOT DETECTED Final   Klebsiella aerogenes NOT DETECTED NOT DETECTED Final   Klebsiella oxytoca NOT DETECTED NOT DETECTED Final   Klebsiella pneumoniae NOT DETECTED NOT DETECTED Final   Proteus species NOT DETECTED NOT DETECTED Final   Salmonella species NOT DETECTED NOT DETECTED Final   Serratia marcescens NOT DETECTED NOT DETECTED Final   Haemophilus influenzae NOT DETECTED NOT DETECTED Final   Neisseria meningitidis NOT DETECTED NOT DETECTED Final   Pseudomonas aeruginosa NOT DETECTED NOT DETECTED Final   Stenotrophomonas maltophilia NOT DETECTED NOT DETECTED Final   Candida albicans NOT DETECTED NOT DETECTED Final   Candida auris NOT DETECTED NOT DETECTED Final   Candida glabrata NOT DETECTED NOT DETECTED Final   Candida krusei NOT DETECTED NOT DETECTED Final   Candida parapsilosis NOT DETECTED NOT DETECTED Final   Candida tropicalis NOT DETECTED NOT DETECTED Final   Cryptococcus neoformans/gattii NOT DETECTED NOT DETECTED Final   CTX-M ESBL NOT DETECTED NOT DETECTED Final   Carbapenem resistance IMP NOT DETECTED NOT DETECTED Final   Carbapenem resistance KPC NOT DETECTED NOT DETECTED Final   Carbapenem resistance NDM NOT DETECTED NOT DETECTED Final   Carbapenem resist OXA 48 LIKE NOT DETECTED NOT DETECTED Final   Carbapenem resistance VIM NOT DETECTED NOT DETECTED Final    Comment: Performed at [facility], [address], [place], [place] [address]  Urine Culture     Status: None   Collection Time: 07/08/22  9:20 AM   Specimen: In/Out Cath Urine  Result Value  Ref Range Status   Specimen Description   Final    IN/OUT CATH URINE Performed at [facility], [address] [place], [place] [address]    Special Requests   Final    NONE Performed at [facility], [address], [place], [place] [address]    Culture   Final    NO  GROWTH Performed at [facility], [place] [address] [place], [place] [address]    Report Status 07/09/2022 FINAL  Final  Respiratory (~20 pathogens) panel by PCR     Status: None   Collection Time: 07/08/22  1:00 PM   Specimen: Nasopharyngeal Swab; Respiratory  Result Value Ref Range Status   Adenovirus NOT DETECTED NOT DETECTED Final   Coronavirus 229E NOT DETECTED NOT DETECTED Final    Comment: (NOTE) The Coronavirus on the Respiratory Panel, DOES NOT test for the novel  Coronavirus (2019 nCoV)    Coronavirus HKU1 NOT DETECTED NOT DETECTED Final   Coronavirus NL63 NOT DETECTED NOT DETECTED Final   Coronavirus OC43 NOT DETECTED NOT DETECTED Final   Metapneumovirus NOT DETECTED NOT DETECTED Final   Rhinovirus / Enterovirus NOT DETECTED NOT DETECTED Final   Influenza A NOT DETECTED NOT DETECTED Final   Influenza B NOT DETECTED NOT DETECTED Final   Parainfluenza Virus 1 NOT DETECTED NOT DETECTED Final   Parainfluenza Virus 2 NOT DETECTED NOT DETECTED Final   Parainfluenza Virus 3 NOT DETECTED NOT DETECTED Final   Parainfluenza Virus 4 NOT DETECTED NOT DETECTED Final   Respiratory Syncytial Virus NOT DETECTED NOT DETECTED Final   Bordetella pertussis NOT DETECTED NOT DETECTED Final   Bordetella Parapertussis NOT DETECTED NOT DETECTED Final   Chlamydophila pneumoniae NOT DETECTED NOT DETECTED Final   Mycoplasma pneumoniae NOT DETECTED NOT DETECTED Final    Comment: Performed at [facility], [place] [address], [place], [place] [address]         Radiology Studies: ECHOCARDIOGRAM COMPLETE BUBBLE STUDY  Result Date: 07/10/2022    ECHOCARDIOGRAM REPORT   Patient Name:   [name] Date of Exam: 07/09/2022 Medical Rec #:  [id number]           Height:       61.0 in Accession #:    [id number]          Weight:       127.0 lb Date of Birth:  29-Dec-1965           BSA:          1.557 m Patient Age:    70 years            BP:           128/79 mmHg Patient Gender: F                   HR:           74 bpm. Exam Location:  [facility] Procedure: 2D Echo, Cardiac Doppler, Color Doppler and Saline Contrast Bubble            Study Indications:     Bacteremia  History:         Patient has prior history of Echocardiogram examinations, most                  recent 11/21/2015. Signs/Symptoms:Syncope. Anxiety.  Sonographer:     Sherrie Sport Referring Phys:  Ailene Rud BSJG Diagnosing Phys: [place]  1. Left ventricular ejection fraction, by estimation, is 60 to 65%. The left ventricle has normal function. The left ventricle has no regional wall motion abnormalities. There is mild left ventricular hypertrophy. Left ventricular diastolic parameters were normal.  2. Right ventricular systolic function is normal. The right ventricular size is normal.  3. Left atrial size was severely dilated.  4. Right atrial size was moderately dilated.  5.  The mitral valve is normal in structure. Mild to moderate mitral valve regurgitation. No evidence of mitral stenosis.  6. Tricuspid valve regurgitation is mild to moderate.  7. The aortic valve is normal in structure. Aortic valve regurgitation is moderate. No aortic stenosis is present.  8. The inferior vena cava is normal in size with greater than 50% respiratory variability, suggesting right atrial pressure of 3 mmHg. FINDINGS  Left Ventricle: Left ventricular ejection fraction, by estimation, is 60 to 65%. The left ventricle has normal function. The left ventricle has no regional wall motion abnormalities. The left ventricular internal cavity size was normal in size. There is  mild left ventricular hypertrophy. Left ventricular diastolic parameters were  normal. Right Ventricle: The right ventricular size is normal. No increase in right ventricular wall thickness. Right ventricular systolic function is normal. Left Atrium: Left atrial size was severely dilated. Right Atrium: Right atrial size was moderately dilated. Pericardium: There is no evidence of pericardial effusion. Mitral Valve: The mitral valve is normal in structure. Mild to moderate mitral valve regurgitation. No evidence of mitral valve stenosis. Tricuspid Valve: The tricuspid valve is normal in structure. Tricuspid valve regurgitation is mild to moderate. No evidence of tricuspid stenosis. Aortic Valve: The aortic valve is normal in structure. Aortic valve regurgitation is moderate. Aortic regurgitation PHT measures 399 msec. No aortic stenosis is present. Aortic valve mean gradient measures 3.0 mmHg. Aortic valve peak gradient measures 5.3 mmHg. Aortic valve area, by VTI measures 2.30 cm. Pulmonic Valve: The pulmonic valve was normal in structure. Pulmonic valve regurgitation is mild. No evidence of pulmonic stenosis. Aorta: The aortic root is normal in size and structure. Venous: The inferior vena cava is normal in size with greater than 50% respiratory variability, suggesting right atrial pressure of 3 mmHg. IAS/Shunts: No atrial level shunt detected by color flow Doppler. Agitated saline contrast was given intravenously to evaluate for intracardiac shunting.  LEFT VENTRICLE PLAX 2D LVIDd:         4.20 cm   Diastology LVIDs:         2.80 cm   LV e' medial:    12.50 cm/s LV PW:         1.00 cm   LV E/e' medial:  8.1 LV IVS:        1.00 cm   LV e' lateral:   10.10 cm/s LVOT diam:     1.80 cm   LV E/e' lateral: 10.0 LV SV:         53 LV SV Index:   34 LVOT Area:     2.54 cm  RIGHT VENTRICLE RV Basal diam:  3.10 cm RV Mid diam:    2.00 cm RV S prime:     15.30 cm/s TAPSE (M-mode): 2.8 cm LEFT ATRIUM             Index        RIGHT ATRIUM           Index LA diam:        3.30 cm 2.12 cm/m   RA Area:      12.30 cm LA Vol (A2C):   53.2 ml 34.16 ml/m  RA Volume:   25.60 ml  16.44 ml/m LA Vol (A4C):   39.3 ml 25.24 ml/m LA Biplane Vol: 48.7 ml 31.27 ml/m  AORTIC VALVE                    PULMONIC VALVE AV Area (Vmax):  2.28 cm     PR End Diast Vel: 6.76 msec AV Area (Vmean):   2.20 cm AV Area (VTI):     2.30 cm AV Vmax:           115.00 cm/s AV Vmean:          78.600 cm/s AV VTI:            0.230 m AV Peak Grad:      5.3 mmHg AV Mean Grad:      3.0 mmHg LVOT Vmax:         103.00 cm/s LVOT Vmean:        68.100 cm/s LVOT VTI:          0.208 m LVOT/AV VTI ratio: 0.90 AI PHT:            399 msec  AORTA Ao Root diam: 3.00 cm MITRAL VALVE                TRICUSPID VALVE MV Area (PHT): 4.86 cm     TR Peak grad:   29.4 mmHg MV Decel Time: 156 msec     TR Vmax:        271.00 cm/s MV E velocity: 101.00 cm/s MV A velocity: 89.10 cm/s   SHUNTS MV E/A ratio:  1.13         Systemic VTI:  0.21 m                             Systemic Diam: 1.80 cm Neoma Laming Electronically signed by Neoma Laming Signature Date/Time: 07/10/2022/9:25:00 AM    Final    US Venous Img Lower Bilateral (DVT)  Result Date: 07/08/2022 CLINICAL DATA:  Short of breath and fever.  Leg swelling EXAM: BILATERAL LOWER EXTREMITY VENOUS DOPPLER ULTRASOUND TECHNIQUE: Gray-scale sonography with graded compression, as well as color Doppler and duplex ultrasound were performed to evaluate the lower extremity deep venous systems from the level of the common femoral vein and including the common femoral, femoral, profunda femoral, popliteal and calf veins including the posterior tibial, peroneal and gastrocnemius veins when visible. The superficial great saphenous vein was also interrogated. Spectral Doppler was utilized to evaluate flow at rest and with distal augmentation maneuvers in the common femoral, femoral and popliteal veins. COMPARISON:  None Available. FINDINGS: RIGHT LOWER EXTREMITY Common Femoral Vein: No evidence of thrombus. Normal  compressibility, respiratory phasicity and response to augmentation. Saphenofemoral Junction: No evidence of thrombus. Normal compressibility and flow on color Doppler imaging. Profunda Femoral Vein: No evidence of thrombus. Normal compressibility and flow on color Doppler imaging. Femoral Vein: No evidence of thrombus. Normal compressibility, respiratory phasicity and response to augmentation. Popliteal Vein: No evidence of thrombus. Normal compressibility, respiratory phasicity and response to augmentation. Calf Veins: No evidence of thrombus. Normal compressibility and flow on color Doppler imaging. Superficial Great Saphenous Vein: No evidence of thrombus. Normal compressibility. Venous Reflux:  None. Other Findings:  None. LEFT LOWER EXTREMITY Common Femoral Vein: No evidence of thrombus. Normal compressibility, respiratory phasicity and response to augmentation. Saphenofemoral Junction: No evidence of thrombus. Normal compressibility and flow on color Doppler imaging. Profunda Femoral Vein: No evidence of thrombus. Normal compressibility and flow on color Doppler imaging. Femoral Vein: No evidence of thrombus. Normal compressibility, respiratory phasicity and response to augmentation. Popliteal Vein: No evidence of thrombus. Normal compressibility, respiratory phasicity and response to augmentation. Calf Veins: No evidence of thrombus. Normal compressibility and flow on color  Doppler imaging. Superficial Great Saphenous Vein: No evidence of thrombus. Normal compressibility. Venous Reflux:  None. Other Findings:  None. IMPRESSION: No evidence of deep venous thrombosis in either lower extremity. Electronically Signed   By: Franchot Gallo M.D.   On: 07/08/2022 15:44   CT Angio Chest Pulmonary Embolism (PE) W or WO Contrast  Result Date: 07/08/2022 CLINICAL DATA:  Shortness of breath, fever and confusion. EXAM: CT ANGIOGRAPHY CHEST WITH CONTRAST TECHNIQUE: Multidetector CT imaging of the chest was performed  using the standard protocol during bolus administration of intravenous contrast. Multiplanar CT image reconstructions and MIPs were obtained to evaluate the vascular anatomy. RADIATION DOSE REDUCTION: This exam was performed according to the departmental dose-optimization program which includes automated exposure control, adjustment of the mA and/or kV according to patient size and/or use of iterative reconstruction technique. CONTRAST:  65m OMNIPAQUE IOHEXOL 350 MG/ML SOLN COMPARISON:  None Available. FINDINGS: Cardiovascular: The heart is normal in size. No pericardial effusion. The thoracic aorta is normal in caliber. No dissection. Enlargement of the pulmonary arterial trunk and right and left main pulmonary arteries possibly suggesting pulmonary hypertension. The pulmonary arteries are well opacified. No filling defects to suggest pulmonary embolism. Mediastinum/Nodes: Borderline enlarged mediastinal hilar lymph nodes. The esophagus is grossly no. Lungs/Pleura: Bibasilar subpleural atelectasis. No pulmonary infiltrates or pleural effusions. No worrisome pulmonary lesions or pulmonary nodules. The central tracheobronchial tree is unremarkable. Upper Abdomen: No significant upper abdominal findings Musculoskeletal: No breast masses, supraclavicular or axillary adenopathy. The bony thorax is intact. Cervical fusion hardware is noted. Multilevel thoracic Schmorl's nodes. Review of the MIP images confirms the above findings. IMPRESSION: 1. No CT findings for pulmonary embolism. 2. Normal thoracic aorta. 3. Enlargement of the pulmonary arterial trunk and right and left main pulmonary arteries possibly suggesting pulmonary hypertension. 4. Borderline mediastinal hilar lymph nodes. Recommend follow-up chest CT in 4 months to re-evaluate. 5. Bibasilar subpleural atelectasis but no pulmonary infiltrates or pleural effusions. 6. No worrisome pulmonary lesions. Electronically Signed   By: PMarijo SanesM.D.   On:  07/08/2022 14:43   MR Lumbar Spine W Wo Contrast  Result Date: 07/08/2022 CLINICAL DATA:  Back pain.  Fever and confusion. EXAM: MRI LUMBAR SPINE WITHOUT AND WITH CONTRAST TECHNIQUE: Multiplanar and multiecho pulse sequences of the lumbar spine were obtained without and with intravenous contrast. CONTRAST:  654mGADAVIST GADOBUTROL 1 MMOL/ML IV SOLN COMPARISON:  Radiography 12/12/2015.  MRI 01/04/2014. FINDINGS: Segmentation:  S1 is a transitional vertebra as numbered previously. Alignment: 1-2 mm of degenerative anterolisthesis at L5-S1 and S1-S2. Vertebrae:  No fracture or focal bone lesion. Conus medullaris and cauda equina: Conus extends to the L1-2 level. Conus and cauda equina appear normal. Paraspinal and other soft tissues: Negative Disc levels: L1-2 and L2-3: Normal L3-4: Minimal disc bulge.  No stenosis. L4-5: Minimal disc bulge.  No stenosis. L5-S1: Bilateral facet arthropathy with 1-2 mm of anterolisthesis. Mild bulging of the disc. Small synovial cyst associated with the right facet joint. Stenosis of both lateral recesses that could possibly be symptomatic. Edematous and enhancing changes of the facet joints could relate to back pain or referred facet syndrome pain. The appearance does not look like infectious disease. 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 Minimal bulging of the disc. No compressive stenosis. IMPRESSION: No sign of infection in the lumbar or upper sacral region. S1 is a transitional vertebra. Worsening of bilateral facet degeneration at L5-S1. 1-2 mm of anterolisthesis. Bulging of the disc. Mild stenosis of  the lateral recesses that could possibly be symptomatic. Edema and enhancement of the facet joints that could relate to back pain or referred facet syndrome pain. The disease does not look like infectious inflammatory disease however. S1-2 shows chronic facet arthritis with 1-2 mm of anterolisthesis. No stenosis. Electronically Signed   By:  Nelson Chimes M.D.   On: 07/08/2022 14:33   MR THORACIC SPINE W WO CONTRAST  Result Date: 07/08/2022 CLINICAL DATA:  Mid back pain EXAM: MRI THORACIC WITHOUT AND WITH CONTRAST TECHNIQUE: Multiplanar and multiecho pulse sequences of the thoracic spine were obtained without and with intravenous contrast. CONTRAST:  52m GADAVIST GADOBUTROL 1 MMOL/ML IV SOLN COMPARISON:  Thoracic spine radiographs 07/12/2009 FINDINGS: Alignment:  Normal alignment.  Mild scoliosis. Vertebrae: Negative for fracture or mass. Disc degeneration and multiple Schmorl's nodes in the mid and lower thoracic spine. Cord:  Normal signal and morphology Paraspinal and other soft tissues: No paraspinous mass or fluid collection Disc levels: Multilevel disc degeneration in the thoracic spine with Schmorl's nodes and mild spurring. Small central disc protrusion T5-6. Negative for thoracic spinal stenosis. IMPRESSION: Thoracic disc degeneration.  No significant stenosis Negative for fracture Electronically Signed   By: CFranchot GalloM.D.   On: 07/08/2022 14:32   MR Cervical Spine W or Wo Contrast  Result Date: 07/08/2022 CLINICAL DATA:  Neck pain.  Fever and confusion. EXAM: MRI CERVICAL SPINE WITHOUT AND WITH CONTRAST TECHNIQUE: Multiplanar and multiecho pulse sequences of the cervical spine, to include the craniocervical junction and cervicothoracic junction, were obtained without and with intravenous contrast. CONTRAST:  628mGADAVIST GADOBUTROL 1 MMOL/ML IV SOLN COMPARISON:  Radiography 12/12/2015.  CT 11/19/2015. FINDINGS: Alignment: No significant malalignment. 1-2 mm of degenerative anterolisthesis at C7-T1. Vertebrae: Previous ACDF C4 through C7. No finding to suggest spinal infection. Cord: No cord compression or focal cord lesion. Posterior Fossa, vertebral arteries, paraspinal tissues: Negative Disc levels: Foramen magnum and C1-2 are normal. C2-3: Chronic facet fusion on the right. Wide patency of the canal and foramina. C3-4: Endplate  osteophytes and bulging of the disc. No central canal stenosis. Mild bony foraminal narrowing on the left. C4 through C7: Previous ACDF has a good appearance with apparent solid union and wide patency of the canal and foramina. C7-T1: Bilateral facet osteoarthritis with 1-2 mm of anterolisthesis. No compressive narrowing of the canal or foramina. IMPRESSION: No sign of infection in the cervical region. Previous ACDF C4 through C7. Solid union with wide patency of the canal and foramina. C7-T1 facet arthritis with 1-2 mm of anterolisthesis. No compressive stenosis. Mild adjacent segment degenerative changes C3-4 without compressive stenosis. Chronic facet joint fusion on the right at C2-3. Electronically Signed   By: MaNelson Chimes.D.   On: 07/08/2022 14:29        Scheduled Meds:  amphetamine-dextroamphetamine  10 mg Oral BID   cariprazine  4.5 mg Oral Daily   DULoxetine  30 mg Oral BID   enoxaparin (LOVENOX) injection  40 mg Subcutaneous Q24H   famotidine  20 mg Oral BID   folic acid  1 mg Oral Daily   gabapentin  300 mg Oral TID   influenza vac split quadrivalent PF  0.5 mL Intramuscular Tomorrow-1000   LORazepam  0-4 mg Oral Q12H   multivitamin with minerals  1 tablet Oral Daily   pneumococcal 20-valent conjugate vaccine  0.5 mL Intramuscular Tomorrow-1000   thiamine  100 mg Oral Daily   Or   thiamine  100 mg Intravenous Daily  Continuous Infusions:  ceFEPime (MAXIPIME) IV 2 g (07/10/22 1143)     LOS: 2 days     Desma Maxim, MD [facility]   If 7PM-7AM, please contact night-coverage [email or web address] Password [facility] 07/10/2022, 2:09 PM

## 2022-07-10 NOTE — Progress Notes (Signed)
Mobility Specialist - Progress Note   Pre-mobility: HR, BP, SpO2 During mobility: HR, BP, SpO2 Post-mobility: HR, BP, SPO2     07/10/22 0934  Mobility  Activity Ambulated independently in hallway  Level of Assistance Independent  Assistive Device None  Distance Ambulated (ft) 180 ft  Activity Response Tolerated well  $Mobility charge 1 Mobility   Pt supine upon entry, utilizing RA. Pt ambulated one lap around NS, tolerated well. Pt left supine with needs within reach, no complaints.   Candie Mile Mobility Specialist 07/10/22 9:36 AM

## 2022-07-10 NOTE — Progress Notes (Signed)
Notes reviewed for TOC needs.

## 2022-07-10 NOTE — Progress Notes (Signed)
Date of Admission:  07/08/2022       ID: [name] is a 56 y.o. female Principal Problem:   Bacteremia Active Problems:   Bipolar disorder ([place])   Alcoholism ([place])   GERD   Back pain   Sepsis ([place])   AKI (acute kidney injury) ([place])   Opioid dependence ([place])   IVDU (intravenous drug user)    Subjective: Says she is feeling a little better  Medications:   amphetamine-dextroamphetamine  10 mg Oral BID   cariprazine  4.5 mg Oral Daily   [START ON 07/11/2022] cosyntropin  0.25 mg Intravenous Once   DULoxetine  30 mg Oral BID   enoxaparin (LOVENOX) injection  40 mg Subcutaneous Q24H   famotidine  20 mg Oral BID   folic acid  1 mg Oral Daily   gabapentin  300 mg Oral TID   influenza vac split quadrivalent PF  0.5 mL Intramuscular Tomorrow-1000   LORazepam  0-4 mg Oral Q12H   multivitamin with minerals  1 tablet Oral Daily   pneumococcal 20-valent conjugate vaccine  0.5 mL Intramuscular Tomorrow-1000   thiamine  100 mg Oral Daily   Or   thiamine  100 mg Intravenous Daily    Objective: Vital signs in last 24 hours: Temp:  [97.5 F (36.4 C)-98 F (36.7 C)] 97.7 F (36.5 C) (09/15 0803) Pulse Rate:  [69-80] 69 (09/15 0803) Resp:  [16-18] 17 (09/15 0803) BP: (122-137)/(69-91) 135/91 (09/15 0803) SpO2:  [95 %-98 %] 96 % (09/15 0803)    PHYSICAL EXAM:  General: Alert, cooperative, no distress, appears stated age.  Head: Normocephalic, without obvious abnormality, atraumatic. Eyes: Conjunctivae clear, anicteric sclerae. Pupils are equal Excoriation over lids and face ENT Nares normal. No drainage or sinus tenderness. Lips, mucosa, and tongue normal. No Thrush Neck: Supple, symmetrical, no adenopathy, thyroid: non tender no carotid bruit and no JVD. Back: No CVA tenderness. Lungs: Clear to auscultation bilaterally. No Wheezing or Rhonchi. No rales. Heart: Regular rate and rhythm, no murmur, rub or gallop. Abdomen: Soft, non-tender,not distended. Bowel  sounds normal. No masses Extremities: atraumatic, no cyanosis. No edema. No clubbing Skin: multiple excoriations Lymph: Cervical, supraclavicular normal. Neurologic: Grossly non-focal  Lab Results Recent Labs    07/08/22 0915 07/09/22 0436  WBC 5.3  --   HGB 11.0*  --   HCT 33.7*  --   NA 140 143  K 3.7 3.9  CL 109 111  CO2 22 26  BUN 19 14  CREATININE 1.30* 0.96   Liver Panel Recent Labs    07/08/22 0915 07/09/22 0436  PROT 6.4* 5.8*  ALBUMIN 3.2* 2.7*  AST 63* 36  ALT 32 24  ALKPHOS 172* 94  BILITOT 0.6 0.7   Sedimentation Rate Recent Labs    07/08/22 0947  ESRSEDRATE 68*   C-Reactive Protein Recent Labs    07/08/22 0947  CRP 14.0*    Microbiology:  Studies/Results: ECHOCARDIOGRAM COMPLETE BUBBLE STUDY  Result Date: 07/10/2022    ECHOCARDIOGRAM REPORT   Patient Name:   [name] Date of Exam: 07/09/2022 Medical Rec #:  [id number]           Height:       61.0 in Accession #:    [id number]          Weight:       127.0 lb Date of Birth:  1965/11/09           BSA:  1.557 m Patient Age:    41 years            BP:           128/79 mmHg Patient Gender: F                   HR:           74 bpm. Exam Location:  [facility] Procedure: 2D Echo, Cardiac Doppler, Color Doppler and Saline Contrast Bubble            Study Indications:     Bacteremia  History:         Patient has prior history of Echocardiogram examinations, most                  recent 11/21/2015. Signs/Symptoms:Syncope. Anxiety.  Sonographer:     Sherrie Sport Referring Phys:  Ailene Rud BPZW Diagnosing Phys: [place]  1. Left ventricular ejection fraction, by estimation, is 60 to 65%. The left ventricle has normal function. The left ventricle has no regional wall motion abnormalities. There is mild left ventricular hypertrophy. Left ventricular diastolic parameters were normal.  2. Right ventricular systolic function is normal. The right ventricular size is normal.  3. Left atrial size  was severely dilated.  4. Right atrial size was moderately dilated.  5. The mitral valve is normal in structure. Mild to moderate mitral valve regurgitation. No evidence of mitral stenosis.  6. Tricuspid valve regurgitation is mild to moderate.  7. The aortic valve is normal in structure. Aortic valve regurgitation is moderate. No aortic stenosis is present.  8. The inferior vena cava is normal in size with greater than 50% respiratory variability, suggesting right atrial pressure of 3 mmHg. FINDINGS  Left Ventricle: Left ventricular ejection fraction, by estimation, is 60 to 65%. The left ventricle has normal function. The left ventricle has no regional wall motion abnormalities. The left ventricular internal cavity size was normal in size. There is  mild left ventricular hypertrophy. Left ventricular diastolic parameters were normal. Right Ventricle: The right ventricular size is normal. No increase in right ventricular wall thickness. Right ventricular systolic function is normal. Left Atrium: Left atrial size was severely dilated. Right Atrium: Right atrial size was moderately dilated. Pericardium: There is no evidence of pericardial effusion. Mitral Valve: The mitral valve is normal in structure. Mild to moderate mitral valve regurgitation. No evidence of mitral valve stenosis. Tricuspid Valve: The tricuspid valve is normal in structure. Tricuspid valve regurgitation is mild to moderate. No evidence of tricuspid stenosis. Aortic Valve: The aortic valve is normal in structure. Aortic valve regurgitation is moderate. Aortic regurgitation PHT measures 399 msec. No aortic stenosis is present. Aortic valve mean gradient measures 3.0 mmHg. Aortic valve peak gradient measures 5.3 mmHg. Aortic valve area, by VTI measures 2.30 cm. Pulmonic Valve: The pulmonic valve was normal in structure. Pulmonic valve regurgitation is mild. No evidence of pulmonic stenosis. Aorta: The aortic root is normal in size and structure.  Venous: The inferior vena cava is normal in size with greater than 50% respiratory variability, suggesting right atrial pressure of 3 mmHg. IAS/Shunts: No atrial level shunt detected by color flow Doppler. Agitated saline contrast was given intravenously to evaluate for intracardiac shunting.  LEFT VENTRICLE PLAX 2D LVIDd:         4.20 cm   Diastology LVIDs:         2.80 cm   LV e' medial:    12.50 cm/s LV PW:  1.00 cm   LV E/e' medial:  8.1 LV IVS:        1.00 cm   LV e' lateral:   10.10 cm/s LVOT diam:     1.80 cm   LV E/e' lateral: 10.0 LV SV:         53 LV SV Index:   34 LVOT Area:     2.54 cm  RIGHT VENTRICLE RV Basal diam:  3.10 cm RV Mid diam:    2.00 cm RV S prime:     15.30 cm/s TAPSE (M-mode): 2.8 cm LEFT ATRIUM             Index        RIGHT ATRIUM           Index LA diam:        3.30 cm 2.12 cm/m   RA Area:     12.30 cm LA Vol (A2C):   53.2 ml 34.16 ml/m  RA Volume:   25.60 ml  16.44 ml/m LA Vol (A4C):   39.3 ml 25.24 ml/m LA Biplane Vol: 48.7 ml 31.27 ml/m  AORTIC VALVE                    PULMONIC VALVE AV Area (Vmax):    2.28 cm     PR End Diast Vel: 6.76 msec AV Area (Vmean):   2.20 cm AV Area (VTI):     2.30 cm AV Vmax:           115.00 cm/s AV Vmean:          78.600 cm/s AV VTI:            0.230 m AV Peak Grad:      5.3 mmHg AV Mean Grad:      3.0 mmHg LVOT Vmax:         103.00 cm/s LVOT Vmean:        68.100 cm/s LVOT VTI:          0.208 m LVOT/AV VTI ratio: 0.90 AI PHT:            399 msec  AORTA Ao Root diam: 3.00 cm MITRAL VALVE                TRICUSPID VALVE MV Area (PHT): 4.86 cm     TR Peak grad:   29.4 mmHg MV Decel Time: 156 msec     TR Vmax:        271.00 cm/s MV E velocity: 101.00 cm/s MV A velocity: 89.10 cm/s   SHUNTS MV E/A ratio:  1.13         Systemic VTI:  0.21 m                             Systemic Diam: 1.80 cm Neoma Laming Electronically signed by Neoma Laming Signature Date/Time: 07/10/2022/9:25:00 AM    Final    US Venous Img Lower Bilateral (DVT)  Result  Date: 07/08/2022 CLINICAL DATA:  Short of breath and fever.  Leg swelling EXAM: BILATERAL LOWER EXTREMITY VENOUS DOPPLER ULTRASOUND TECHNIQUE: Gray-scale sonography with graded compression, as well as color Doppler and duplex ultrasound were performed to evaluate the lower extremity deep venous systems from the level of the common femoral vein and including the common femoral, femoral, profunda femoral, popliteal and calf veins including the posterior tibial, peroneal and gastrocnemius veins when visible. The superficial great saphenous vein was also interrogated.  Spectral Doppler was utilized to evaluate flow at rest and with distal augmentation maneuvers in the common femoral, femoral and popliteal veins. COMPARISON:  None Available. FINDINGS: RIGHT LOWER EXTREMITY Common Femoral Vein: No evidence of thrombus. Normal compressibility, respiratory phasicity and response to augmentation. Saphenofemoral Junction: No evidence of thrombus. Normal compressibility and flow on color Doppler imaging. Profunda Femoral Vein: No evidence of thrombus. Normal compressibility and flow on color Doppler imaging. Femoral Vein: No evidence of thrombus. Normal compressibility, respiratory phasicity and response to augmentation. Popliteal Vein: No evidence of thrombus. Normal compressibility, respiratory phasicity and response to augmentation. Calf Veins: No evidence of thrombus. Normal compressibility and flow on color Doppler imaging. Superficial Great Saphenous Vein: No evidence of thrombus. Normal compressibility. Venous Reflux:  None. Other Findings:  None. LEFT LOWER EXTREMITY Common Femoral Vein: No evidence of thrombus. Normal compressibility, respiratory phasicity and response to augmentation. Saphenofemoral Junction: No evidence of thrombus. Normal compressibility and flow on color Doppler imaging. Profunda Femoral Vein: No evidence of thrombus. Normal compressibility and flow on color Doppler imaging. Femoral Vein: No  evidence of thrombus. Normal compressibility, respiratory phasicity and response to augmentation. Popliteal Vein: No evidence of thrombus. Normal compressibility, respiratory phasicity and response to augmentation. Calf Veins: No evidence of thrombus. Normal compressibility and flow on color Doppler imaging. Superficial Great Saphenous Vein: No evidence of thrombus. Normal compressibility. Venous Reflux:  None. Other Findings:  None. IMPRESSION: No evidence of deep venous thrombosis in either lower extremity. Electronically Signed   By: Franchot Gallo M.D.   On: 07/08/2022 15:44   CT Angio Chest Pulmonary Embolism (PE) W or WO Contrast  Result Date: 07/08/2022 CLINICAL DATA:  Shortness of breath, fever and confusion. EXAM: CT ANGIOGRAPHY CHEST WITH CONTRAST TECHNIQUE: Multidetector CT imaging of the chest was performed using the standard protocol during bolus administration of intravenous contrast. Multiplanar CT image reconstructions and MIPs were obtained to evaluate the vascular anatomy. RADIATION DOSE REDUCTION: This exam was performed according to the departmental dose-optimization program which includes automated exposure control, adjustment of the mA and/or kV according to patient size and/or use of iterative reconstruction technique. CONTRAST:  3m OMNIPAQUE IOHEXOL 350 MG/ML SOLN COMPARISON:  None Available. FINDINGS: Cardiovascular: The heart is normal in size. No pericardial effusion. The thoracic aorta is normal in caliber. No dissection. Enlargement of the pulmonary arterial trunk and right and left main pulmonary arteries possibly suggesting pulmonary hypertension. The pulmonary arteries are well opacified. No filling defects to suggest pulmonary embolism. Mediastinum/Nodes: Borderline enlarged mediastinal hilar lymph nodes. The esophagus is grossly no. Lungs/Pleura: Bibasilar subpleural atelectasis. No pulmonary infiltrates or pleural effusions. No worrisome pulmonary lesions or pulmonary  nodules. The central tracheobronchial tree is unremarkable. Upper Abdomen: No significant upper abdominal findings Musculoskeletal: No breast masses, supraclavicular or axillary adenopathy. The bony thorax is intact. Cervical fusion hardware is noted. Multilevel thoracic Schmorl's nodes. Review of the MIP images confirms the above findings. IMPRESSION: 1. No CT findings for pulmonary embolism. 2. Normal thoracic aorta. 3. Enlargement of the pulmonary arterial trunk and right and left main pulmonary arteries possibly suggesting pulmonary hypertension. 4. Borderline mediastinal hilar lymph nodes. Recommend follow-up chest CT in 4 months to re-evaluate. 5. Bibasilar subpleural atelectasis but no pulmonary infiltrates or pleural effusions. 6. No worrisome pulmonary lesions. Electronically Signed   By: PMarijo SanesM.D.   On: 07/08/2022 14:43     Assessment/Plan: Gram-negative bacteremia with sepsis. Unclear source No UTI Imaging of her entire spine no infectious finding Need 2 d echo  and may be TEE to look for endocarditis  Will need CT abd/pelvis   Continue cefepime.  Await ID of gram-negative bacteria and susceptibility   ACDF cervical spine- no infection   AKI resolved Mildly elevated AST   Low cortisol Check ACTH   Pt says she has crushed her pain meds and mainlined  a few times    Discussed the management with patient and hospitalist ID will follow her peripherally this weekend. Call if needed

## 2022-07-11 DIAGNOSIS — R7881 Bacteremia: Secondary | ICD-10-CM | POA: Diagnosis not present

## 2022-07-11 LAB — ACTH: C206 ACTH: 28.9 pg/mL (ref 7.2–63.3)

## 2022-07-11 LAB — CULTURE, BLOOD (ROUTINE X 2): Special Requests: ADEQUATE

## 2022-07-11 LAB — GLUCOSE, CAPILLARY
Glucose-Capillary: 94 mg/dL (ref 70–99)
Glucose-Capillary: 111 mg/dL — ABNORMAL HIGH (ref 70–99)

## 2022-07-11 LAB — ACTH STIMULATION, 3 TIME POINTS
Cortisol, 30 Min: 14.1 ug/dL
Cortisol, 60 Min: 16.1 ug/dL
Cortisol, Base: 2.7 ug/dL

## 2022-07-11 LAB — HCV AB W REFLEX TO QUANT PCR: HCV Ab: NONREACTIVE

## 2022-07-11 LAB — HCV INTERPRETATION

## 2022-07-11 MED ORDER — SODIUM CHLORIDE 0.9 % IV SOLN
2.0000 g | INTRAVENOUS | Status: DC
  Administered 2022-07-12 – 2022-07-13 (×2): 2 g via INTRAVENOUS
  Filled 2022-07-11 (×3): qty 20
  Filled 2022-07-11: qty 2

## 2022-07-11 MED ORDER — SODIUM CHLORIDE 0.9 % IV SOLN
2.0000 g | Freq: Two times a day (BID) | INTRAVENOUS | Status: AC
  Administered 2022-07-11: 2 g via INTRAVENOUS
  Filled 2022-07-11: qty 12.5

## 2022-07-11 NOTE — Progress Notes (Signed)
   [facility] has been requested to perform a transesophageal echocardiogram on Terrance Mass for bacteremia.  After careful review of history and examination, the risks and benefits of transesophageal echocardiogram have been explained including risks of esophageal damage, perforation (1:10,000 risk), bleeding, pharyngeal hematoma as well as other potential complications associated with conscious sedation including aspiration, arrhythmia, respiratory failure and death. Alternatives to treatment were discussed, questions were answered. Patient is willing to proceed.   Shira Bobst Ninfa Meeker, PA-C  07/11/2022 11:05 AM

## 2022-07-11 NOTE — Progress Notes (Signed)
PROGRESS NOTE    [name]  WJX:[id number] DOB: 1966/05/01 DOA: 07/08/2022 PCP: Sandi Mariscal, MD      Brief Narrative:   From admission h and p [name] is a 56 y.o. female with medical history significant for Bipolar disorder, anxiety, depression, thyroid disease who presents to the ER for evaluation of fever and shortness of breath. Patient states that she has had symptoms intermittently over months which she describes as fever, chills, generalized fatigue and confusion.  She also complains of worsening low back pain mostly in the lower lumbar region.  She has a history of chronic back pain and states that it is worse today.  She rates her pain an 8 x 10 in intensity at its worst and denies having any associated lower extremity weakness, no urinary or fecal incontinence and denies having saddle anesthesia.  She denies any recent trauma.  She complains of shortness of breath but denies having any cough or chest pain. She denies having any nausea, no vomiting, no abdominal pain, no dizziness, no lightheadedness, no headache, no blurred vision no focal deficit. She had a fever with a Tmax of 103 upon arrival to the ER, she was tachycardic and tachypneic.  Lactic acid was elevated at 2.8.  Slightly elevated AST level at 63 and an AKI with increase in her serum creatinine from 0.9-1.3.   Assessment & Plan:   Principal Problem:   Gram-negative bacteremia Active Problems:   Bipolar disorder ([place])   Alcoholism ([place])   GERD   Back pain   Sepsis ([place])   AKI (acute kidney injury) ([place])   Opioid dependence ([place])   IVDU (intravenous drug user)  # Sepsis 2/2 bacteremia, by fever and tachycardia and lactic acidosis. Lactate normalized w/ fluids, hemodynamically stable. Sepsis phsyiology resolved.  # Pantoea bacteremia Secondary to IVDU, no other clear sources of infection. TTE no vegetations. MRI c/t/l spine negative - cont cefepime through today per ID, then switch to  ceftriaxone - cardiology consulted for TEE, scheduled for 9/18 - ID following  # IVDU Did not admit to this on presentation but now admits to occasional IVDU (opioids). Hiv, hbv, hcv, rpr neg - monitor for s/s withdrawal - TOC consulted  # Low cortisol - acth stim results pending  # Joint pain Reports of joint pain and swelling multiple finger joints and legs. Nothing obvious on exam - f/u ana, rf  # Chronic pain # Opioid dependence - cont home oxy, gabapentin, flexeril  # Bipolar - cont home cariprazine  # ADHD - cont home adderall  # Alcohol use Reports couple of drinks a day, no signs withdrawal - d/c ciwa - vitamins   DVT prophylaxis: lovenox Code Status: full Family Communication: husband updated @ bedside 9/16  Level of care: Med-Surg Status is: Inpatient Remains inpatient appropriate because: severity of illness    Consultants:  ID  Procedures: none  Antimicrobials:  Vanc/cefepime/flagyl>cefepime    Subjective: Feels well, no fevers or chills  Objective: Vitals:   07/10/22 2101 07/11/22 0612 07/11/22 0759 07/11/22 1201  BP: (!) 149/78 133/87 138/73 125/73  Pulse: 71 69 67 72  Resp: '16 16 17 17  '$ Temp: 98.9 F (37.2 C) 98.5 F (36.9 C) 97.8 F (36.6 C) 98.8 F (37.1 C)  TempSrc:      SpO2: 98% 97% 98% 98%  Weight:      Height:        Intake/Output Summary (Last 24 hours) at 07/11/2022 1418 Last data filed at  07/11/2022 0900 Gross per 24 hour  Intake 1103.33 ml  Output --  Net 1103.33 ml   Filed Weights   07/08/22 1400  Weight: 57.6 kg    Examination:  General exam: Appears calm and comfortable  Respiratory system: Clear to auscultation. Respiratory effort normal. Cardiovascular system: S1 & S2 heard, RRR. No JVD, murmurs, rubs, gallops or clicks. No pedal edema. Gastrointestinal system: Abdomen is nondistended, soft and nontender. No organomegaly or masses felt. Normal bowel sounds heard. Central nervous system: Alert and  oriented. No focal neurological deficits. Extremities: Symmetric 5 x 5 power. Skin: No rashes, lesions or ulcers Psychiatry: Judgement and insight appear normal. Mood & affect appropriate.     Data Reviewed: I have personally reviewed following labs and imaging studies  CBC: Recent Labs  Lab 07/08/22 0915  WBC 5.3  NEUTROABS 4.4  HGB 11.0*  HCT 33.7*  MCV 89.2  PLT 128*   Basic Metabolic Panel: Recent Labs  Lab 07/08/22 0915 07/09/22 0436  NA 140 143  K 3.7 3.9  CL 109 111  CO2 22 26  GLUCOSE 99 93  BUN 19 14  CREATININE 1.30* 0.96  CALCIUM 9.0 8.7*   GFR: Estimated Creatinine Clearance: 52.8 mL/min (by C-G formula based on SCr of 0.96 mg/dL). Liver Function Tests: Recent Labs  Lab 07/08/22 0915 07/09/22 0436  AST 63* 36  ALT 32 24  ALKPHOS 172* 94  BILITOT 0.6 0.7  PROT 6.4* 5.8*  ALBUMIN 3.2* 2.7*   No results for input(s): "LIPASE", "AMYLASE" in the last 168 hours. No results for input(s): "AMMONIA" in the last 168 hours. Coagulation Profile: Recent Labs  Lab 07/08/22 0915 07/09/22 0436  INR 0.9 1.0   Cardiac Enzymes: No results for input(s): "CKTOTAL", "CKMB", "CKMBINDEX", "TROPONINI" in the last 168 hours. BNP (last 3 results) No results for input(s): "PROBNP" in the last 8760 hours. HbA1C: No results for input(s): "HGBA1C" in the last 72 hours. CBG: Recent Labs  Lab 07/09/22 1243 07/09/22 2032 07/10/22 2106 07/11/22 0808 07/11/22 1203  GLUCAP 99 112* 91 94 111*   Lipid Profile: No results for input(s): "CHOL", "HDL", "LDLCALC", "TRIG", "CHOLHDL", "LDLDIRECT" in the last 72 hours. Thyroid Function Tests: Recent Labs    07/10/22 0450  TSH 1.270   Anemia Panel: No results for input(s): "VITAMINB12", "FOLATE", "FERRITIN", "TIBC", "IRON", "RETICCTPCT" in the last 72 hours. Urine analysis:    Component Value Date/Time   COLORURINE YELLOW (A) 07/08/2022 0920   APPEARANCEUR HAZY (A) 07/08/2022 0920   LABSPEC 1.019 07/08/2022 0920    PHURINE 5.0 07/08/2022 0920   GLUCOSEU NEGATIVE 07/08/2022 0920   HGBUR SMALL (A) 07/08/2022 0920   BILIRUBINUR NEGATIVE 07/08/2022 0920   KETONESUR NEGATIVE 07/08/2022 0920   PROTEINUR 100 (A) 07/08/2022 0920   UROBILINOGEN 0.2 05/26/2015 0722   NITRITE NEGATIVE 07/08/2022 0920   LEUKOCYTESUR NEGATIVE 07/08/2022 0920   Sepsis Labs: '@LABRCNTIP'$ (procalcitonin:4,lacticidven:4)  ) Recent Results (from the past 240 hour(s))  Resp Panel by RT-PCR (Flu A&B, Covid) Anterior Nasal Swab     Status: None   Collection Time: 07/08/22  9:15 AM   Specimen: Anterior Nasal Swab  Result Value Ref Range Status   SARS Coronavirus 2 by RT PCR NEGATIVE NEGATIVE Final    Comment: (NOTE) SARS-CoV-2 target nucleic acids are NOT DETECTED.  The SARS-CoV-2 RNA is generally detectable in upper respiratory specimens during the acute phase of infection. The lowest concentration of SARS-CoV-2 viral copies this assay can detect is 138 copies/mL. A negative result does  not preclude SARS-Cov-2 infection and should not be used as the sole basis for treatment or other patient management decisions. A negative result may occur with  improper specimen collection/handling, submission of specimen other than nasopharyngeal swab, presence of viral mutation(s) within the areas targeted by this assay, and inadequate number of viral copies(<138 copies/mL). A negative result must be combined with clinical observations, patient history, and epidemiological information. The expected result is Negative.  Fact Sheet for Patients:  EntrepreneurPulse.com.au  Fact Sheet for Healthcare Providers:  IncredibleEmployment.be  This test is no t yet approved or cleared by the [place] FDA and  has been authorized for detection and/or diagnosis of SARS-CoV-2 by FDA under an Emergency Use Authorization (EUA). This EUA will remain  in effect (meaning this test can be used) for the duration of  the COVID-19 declaration under Section 564(b)(1) of the Act, 21 U.S.C.section 360bbb-3(b)(1), unless the authorization is terminated  or revoked sooner.       Influenza A by PCR NEGATIVE NEGATIVE Final   Influenza B by PCR NEGATIVE NEGATIVE Final    Comment: (NOTE) The Xpert Xpress SARS-CoV-2/FLU/RSV plus assay is intended as an aid in the diagnosis of influenza from Nasopharyngeal swab specimens and should not be used as a sole basis for treatment. Nasal washings and aspirates are unacceptable for Xpert Xpress SARS-CoV-2/FLU/RSV testing.  Fact Sheet for Patients: EntrepreneurPulse.com.au  Fact Sheet for Healthcare Providers: IncredibleEmployment.be  This test is not yet approved or cleared by the [place] FDA and has been authorized for detection and/or diagnosis of SARS-CoV-2 by FDA under an Emergency Use Authorization (EUA). This EUA will remain in effect (meaning this test can be used) for the duration of the COVID-19 declaration under Section 564(b)(1) of the Act, 21 U.S.C. section 360bbb-3(b)(1), unless the authorization is terminated or revoked.  Performed at [facility], [address] [place], [place] [address]   Blood Culture (routine x 2)     Status: Abnormal   Collection Time: 07/08/22  9:15 AM   Specimen: BLOOD  Result Value Ref Range Status   Specimen Description   Final    BLOOD BLOOD LEFT WRIST Performed at [facility], [address], [place], [place] [address]    Special Requests   Final    BOTTLES DRAWN AEROBIC AND ANAEROBIC Blood Culture adequate volume Performed at [facility], [address], [place], [place] [address]    Culture  Setup Time   Final    GRAM NEGATIVE RODS IN BOTH AEROBIC AND ANAEROBIC BOTTLES CRITICAL RESULT CALLED TO, READ BACK BY AND VERIFIED WITH: JASON ROBBINS 07/09/22 0703 MW    Culture (A)  Final    PANTOEA SPECIES SUSCEPTIBILITIES PERFORMED ON  PREVIOUS CULTURE WITHIN THE LAST 5 DAYS. Performed at [facility], [place] [address], [place], [place] [address]    Report Status 07/11/2022 FINAL  Final  Blood Culture (routine x 2)     Status: Abnormal   Collection Time: 07/08/22  9:15 AM   Specimen: BLOOD  Result Value Ref Range Status   Specimen Description   Final    BLOOD BLOOD RIGHT FOREARM Performed at [facility], [address] [place], [place] [address]    Special Requests   Final    BOTTLES DRAWN AEROBIC AND ANAEROBIC Blood Culture results may not be optimal due to an excessive volume of blood received in culture bottles Performed at [facility], [address], [place], [place] [address]  Culture  Setup Time   Final    GRAM NEGATIVE RODS IN BOTH AEROBIC AND ANAEROBIC BOTTLES CRITICAL RESULT CALLED TO, READ BACK BY AND VERIFIED WITH: JASON ROBBINS 07/09/22 0703 MW Performed at [facility], [address] [place], [place] [address]    Culture PANTOEA SPECIES (A)  Final   Report Status 07/11/2022 FINAL  Final   Organism ID, Bacteria PANTOEA SPECIES  Final      Susceptibility   Pantoea species - MIC*    CEFAZOLIN >=64 RESISTANT Resistant     CEFEPIME <=0.12 SENSITIVE Sensitive     CEFTAZIDIME <=1 SENSITIVE Sensitive     CEFTRIAXONE <=0.25 SENSITIVE Sensitive     CIPROFLOXACIN <=0.25 SENSITIVE Sensitive     GENTAMICIN <=1 SENSITIVE Sensitive     IMIPENEM <=0.25 SENSITIVE Sensitive     TRIMETH/SULFA <=20 SENSITIVE Sensitive     PIP/TAZO <=4 SENSITIVE Sensitive     * PANTOEA SPECIES  Blood Culture ID Panel (Reflexed)     Status: Abnormal   Collection Time: 07/08/22  9:15 AM  Result Value Ref Range Status   Enterococcus faecalis NOT DETECTED NOT DETECTED Final   Enterococcus Faecium NOT DETECTED NOT DETECTED Final   Listeria monocytogenes NOT DETECTED NOT DETECTED Final   Staphylococcus species NOT DETECTED NOT DETECTED Final   Staphylococcus aureus (BCID) NOT DETECTED NOT  DETECTED Final   Staphylococcus epidermidis NOT DETECTED NOT DETECTED Final   Staphylococcus lugdunensis NOT DETECTED NOT DETECTED Final   Streptococcus species NOT DETECTED NOT DETECTED Final   Streptococcus agalactiae NOT DETECTED NOT DETECTED Final   Streptococcus pneumoniae NOT DETECTED NOT DETECTED Final   Streptococcus pyogenes NOT DETECTED NOT DETECTED Final   A.calcoaceticus-baumannii NOT DETECTED NOT DETECTED Final   Bacteroides fragilis NOT DETECTED NOT DETECTED Final   Enterobacterales DETECTED (A) NOT DETECTED Final    Comment: Enterobacterales represent a large order of gram negative bacteria, not a single organism. Refer to culture for further identification. CRITICAL RESULT CALLED TO, READ BACK BY AND VERIFIED WITH: JASON ROBBINS 07/09/22 0703 MW    Enterobacter cloacae complex NOT DETECTED NOT DETECTED Final   Escherichia coli NOT DETECTED NOT DETECTED Final   Klebsiella aerogenes NOT DETECTED NOT DETECTED Final   Klebsiella oxytoca NOT DETECTED NOT DETECTED Final   Klebsiella pneumoniae NOT DETECTED NOT DETECTED Final   Proteus species NOT DETECTED NOT DETECTED Final   Salmonella species NOT DETECTED NOT DETECTED Final   Serratia marcescens NOT DETECTED NOT DETECTED Final   Haemophilus influenzae NOT DETECTED NOT DETECTED Final   Neisseria meningitidis NOT DETECTED NOT DETECTED Final   Pseudomonas aeruginosa NOT DETECTED NOT DETECTED Final   Stenotrophomonas maltophilia NOT DETECTED NOT DETECTED Final   Candida albicans NOT DETECTED NOT DETECTED Final   Candida auris NOT DETECTED NOT DETECTED Final   Candida glabrata NOT DETECTED NOT DETECTED Final   Candida krusei NOT DETECTED NOT DETECTED Final   Candida parapsilosis NOT DETECTED NOT DETECTED Final   Candida tropicalis NOT DETECTED NOT DETECTED Final   Cryptococcus neoformans/gattii NOT DETECTED NOT DETECTED Final   CTX-M ESBL NOT DETECTED NOT DETECTED Final   Carbapenem resistance IMP NOT DETECTED NOT DETECTED  Final   Carbapenem resistance KPC NOT DETECTED NOT DETECTED Final   Carbapenem resistance NDM NOT DETECTED NOT DETECTED Final   Carbapenem resist OXA 48 LIKE NOT DETECTED NOT DETECTED Final   Carbapenem resistance VIM NOT DETECTED NOT DETECTED Final    Comment: Performed at [facility], [address],  [place], [place] [address]  Urine Culture     Status: None   Collection Time: 07/08/22  9:20 AM   Specimen: In/Out Cath Urine  Result Value Ref Range Status   Specimen Description   Final    IN/OUT CATH URINE Performed at [facility], [address] [place], [place] [address]    Special Requests   Final    NONE Performed at [facility], [address] [place], [place] [address]    Culture   Final    NO GROWTH Performed at [facility], [place] [address] [place], [place] [address]    Report Status 07/09/2022 FINAL  Final  Respiratory (~20 pathogens) panel by PCR     Status: None   Collection Time: 07/08/22  1:00 PM   Specimen: Nasopharyngeal Swab; Respiratory  Result Value Ref Range Status   Adenovirus NOT DETECTED NOT DETECTED Final   Coronavirus 229E NOT DETECTED NOT DETECTED Final    Comment: (NOTE) The Coronavirus on the Respiratory Panel, DOES NOT test for the novel  Coronavirus (2019 nCoV)    Coronavirus HKU1 NOT DETECTED NOT DETECTED Final   Coronavirus NL63 NOT DETECTED NOT DETECTED Final   Coronavirus OC43 NOT DETECTED NOT DETECTED Final   Metapneumovirus NOT DETECTED NOT DETECTED Final   Rhinovirus / Enterovirus NOT DETECTED NOT DETECTED Final   Influenza A NOT DETECTED NOT DETECTED Final   Influenza B NOT DETECTED NOT DETECTED Final   Parainfluenza Virus 1 NOT DETECTED NOT DETECTED Final   Parainfluenza Virus 2 NOT DETECTED NOT DETECTED Final   Parainfluenza Virus 3 NOT DETECTED NOT DETECTED Final   Parainfluenza Virus 4 NOT DETECTED NOT DETECTED Final   Respiratory Syncytial Virus NOT DETECTED NOT DETECTED Final   Bordetella  pertussis NOT DETECTED NOT DETECTED Final   Bordetella Parapertussis NOT DETECTED NOT DETECTED Final   Chlamydophila pneumoniae NOT DETECTED NOT DETECTED Final   Mycoplasma pneumoniae NOT DETECTED NOT DETECTED Final    Comment: Performed at [facility], [place] [address], [place], [place] [address]  Culture, blood (Routine X 2) w Reflex to ID Panel     Status: None (Preliminary result)   Collection Time: 07/10/22  4:50 AM   Specimen: BLOOD  Result Value Ref Range Status   Specimen Description BLOOD RIGHT ARM  Final   Special Requests   Final    BOTTLES DRAWN AEROBIC AND ANAEROBIC Blood Culture adequate volume   Culture   Final    NO GROWTH < 24 HOURS Performed at [facility], [address] [place], [place] [address]    Report Status PENDING  Incomplete  Culture, blood (Routine X 2) w Reflex to ID Panel     Status: None (Preliminary result)   Collection Time: 07/10/22  4:50 AM   Specimen: BLOOD  Result Value Ref Range Status   Specimen Description BLOOD RIGHT HAND  Final   Special Requests   Final    BOTTLES DRAWN AEROBIC AND ANAEROBIC Blood Culture adequate volume   Culture   Final    NO GROWTH < 24 HOURS Performed at [facility], [name] [place], [place] [address]    Report Status PENDING  Incomplete         Radiology Studies: No results found.      Scheduled Meds:  amphetamine-dextroamphetamine  10 mg Oral BID   cariprazine  4.5 mg Oral Daily   DULoxetine  30 mg Oral BID   enoxaparin (LOVENOX) injection  40 mg Subcutaneous Q24H   famotidine  20 mg Oral BID   folic acid  1 mg Oral Daily   gabapentin  300 mg Oral TID   influenza vac split quadrivalent PF  0.5 mL Intramuscular Tomorrow-1000   LORazepam  0-4 mg Oral Q12H   multivitamin with minerals  1 tablet Oral Daily   pneumococcal 20-valent conjugate vaccine  0.5 mL Intramuscular Tomorrow-1000   thiamine  100 mg Oral Daily   Or   thiamine  100 mg Intravenous Daily    Continuous Infusions:  ceFEPime (MAXIPIME) IV 2 g (07/11/22 1000)     LOS: 3 days     Desma Maxim, MD [facility]   If 7PM-7AM, please contact night-coverage [email or web address] Password [email or web address] 07/11/2022, 2:18 PM

## 2022-07-12 DIAGNOSIS — F1990 Other psychoactive substance use, unspecified, uncomplicated: Secondary | ICD-10-CM | POA: Diagnosis not present

## 2022-07-12 DIAGNOSIS — R7881 Bacteremia: Secondary | ICD-10-CM | POA: Diagnosis not present

## 2022-07-12 LAB — RHEUMATOID FACTOR: Rheumatoid fact SerPl-aCnc: <10 IU/mL (ref <14.0)

## 2022-07-12 MED ORDER — SODIUM CHLORIDE 0.9 % IV SOLN: INTRAVENOUS | Status: DC

## 2022-07-12 NOTE — Progress Notes (Signed)
   Date of Admission:  07/08/2022       ID: [name] is a 56 y.o. female Principal Problem:   Gram-negative bacteremia Active Problems:   Bipolar disorder ([name])   Alcoholism ([name])   GERD   Back pain   Sepsis ([name])   AKI (acute kidney injury) ([name])   Opioid dependence ([name])   IVDU (intravenous drug user)    Subjective: No specific complaints  Medications:   amphetamine-dextroamphetamine  10 mg Oral BID   cariprazine  4.5 mg Oral Daily   DULoxetine  30 mg Oral BID   enoxaparin (LOVENOX) injection  40 mg Subcutaneous Q24H   famotidine  20 mg Oral BID   folic acid  1 mg Oral Daily   gabapentin  300 mg Oral TID   influenza vac split quadrivalent PF  0.5 mL Intramuscular Tomorrow-1000   multivitamin with minerals  1 tablet Oral Daily   pneumococcal 20-valent conjugate vaccine  0.5 mL Intramuscular Tomorrow-1000   thiamine  100 mg Oral Daily   Or   thiamine  100 mg Intravenous Daily    Objective: Vital signs in last 24 hours: Temp:  [97.8 F (36.6 C)-98.8 F (37.1 C)] 98.1 F (36.7 C) (09/17 0809) Pulse Rate:  [66-88] 71 (09/17 0809) Resp:  [16-19] 18 (09/17 0809) BP: (115-137)/(69-80) 115/69 (09/17 0809) SpO2:  [96 %-99 %] 97 % (09/17 0809)    PHYSICAL EXAM:  General: Alert, cooperative, no distress, appears stated age.  Lungs: Clear to auscultation bilaterally. No Wheezing or Rhonchi. No rales. Heart: Regular rate and rhythm, no murmur, rub or gallop. Abdomen: Soft, non-tender,not distended. Bowel sounds normal. No masses Extremities: atraumatic, no cyanosis. No edema. No clubbing Skin: multiple excoriations Lymph: Cervical, supraclavicular normal. Neurologic: Grossly non-focal    Microbiology: 07/08/22- pantoea bacteremia 07/10/22 NG    Assessment/Plan: Pantoea bacteremia with sepsis. Unclear source but very likely due to IVDA No UTI Imaging of her entire spine no infectious finding TEE to look for endocarditis Continue ceftriaxone Will  decide on OP antibiotic after TEE Pt says she has crushed her pain meds and mainlined  a few times     ACDF cervical spine- no infection   AKI resolved Mildly elevated AST   Low cortisol ACTH N Stim test N     Discussed the management with patient and hospitalist

## 2022-07-12 NOTE — Progress Notes (Signed)
PROGRESS NOTE    [name]  YWV:[id number] DOB: 31-May-1966 DOA: 07/08/2022 PCP: Sandi Mariscal, MD      Brief Narrative:   From admission h and p [name] is a 56 y.o. female with medical history significant for Bipolar disorder, anxiety, depression, thyroid disease who presents to the ER for evaluation of fever and shortness of breath. Patient states that she has had symptoms intermittently over months which she describes as fever, chills, generalized fatigue and confusion.  She also complains of worsening low back pain mostly in the lower lumbar region.  She has a history of chronic back pain and states that it is worse today.  She rates her pain an 8 x 10 in intensity at its worst and denies having any associated lower extremity weakness, no urinary or fecal incontinence and denies having saddle anesthesia.  She denies any recent trauma.  She complains of shortness of breath but denies having any cough or chest pain. She denies having any nausea, no vomiting, no abdominal pain, no dizziness, no lightheadedness, no headache, no blurred vision no focal deficit. She had a fever with a Tmax of 103 upon arrival to the ER, she was tachycardic and tachypneic.  Lactic acid was elevated at 2.8.  Slightly elevated AST level at 63 and an AKI with increase in her serum creatinine from 0.9-1.3.   Assessment & Plan:   Principal Problem:   Gram-negative bacteremia Active Problems:   Sepsis ([place])   Back pain   AKI (acute kidney injury) ([place])   Bipolar disorder ([place])   Alcoholism ([place])   GERD   Opioid dependence ([place])   IVDU (intravenous drug user)  # Sepsis 2/2 bacteremia, by fever and tachycardia and lactic acidosis. Lactate normalized w/ fluids, hemodynamically stable. Sepsis phsyiology resolved.  # Pantoea bacteremia Secondary to IVDU, no other clear sources of infection. TTE no vegetations. MRI c/t/l spine negative - cont ceftriaxone (on cefepime until today) - cardiology  consulted for TEE, scheduled for 9/18 - ID following  # IVDU Did not admit to this on presentation but now admits to occasional IVDU (opioids). Hiv, hbv, hcv, rpr neg - monitor for s/s withdrawal - TOC consulted  # Low cortisol - acth stim equivocal, will f/u dheas  # Joint pain Reports of joint pain and swelling multiple finger joints and legs. Nothing obvious on exam - f/u ana, rf, which are pending  # Chronic pain # Opioid dependence - cont home oxy, gabapentin, flexeril  # Bipolar - cont home cariprazine  # ADHD - cont home adderall  # Alcohol use Reports couple of drinks a day, no signs withdrawal - vitamins   DVT prophylaxis: lovenox Code Status: full Family Communication: husband updated @ bedside 9/16  Level of care: Med-Surg Status is: Inpatient Remains inpatient appropriate because: severity of illness    Consultants:  ID  Procedures: none  Antimicrobials:  Vanc/cefepime/flagyl>cefepime>ceftriaxone   Subjective: Feels well, no fevers or chills  Objective: Vitals:   07/11/22 1546 07/11/22 2124 07/12/22 0530 07/12/22 0809  BP: 120/72 137/74 134/80 115/69  Pulse: 70 88 66 71  Resp: '19 16 16 18  '$ Temp: 98.4 F (36.9 C) 98.2 F (36.8 C) 97.8 F (36.6 C) 98.1 F (36.7 C)  TempSrc:  Oral Oral   SpO2: 97% 96% 99% 97%  Weight:      Height:        Intake/Output Summary (Last 24 hours) at 07/12/2022 1339 Last data filed at 07/12/2022 1300 Gross per  24 hour  Intake 1056.67 ml  Output --  Net 1056.67 ml   Filed Weights   07/08/22 1400  Weight: 57.6 kg    Examination:  General exam: Appears calm and comfortable  Respiratory system: Clear to auscultation. Respiratory effort normal. Cardiovascular system: S1 & S2 heard, RRR. No JVD, murmurs, rubs, gallops or clicks. No pedal edema. Gastrointestinal system: Abdomen is nondistended, soft and nontender. No organomegaly or masses felt. Normal bowel sounds heard. Central nervous system: Alert  and oriented. No focal neurological deficits. Extremities: Symmetric 5 x 5 power. Skin: No rashes, lesions or ulcers Psychiatry: Judgement and insight appear normal. Mood & affect appropriate.     Data Reviewed: I have personally reviewed following labs and imaging studies  CBC: Recent Labs  Lab 07/08/22 0915  WBC 5.3  NEUTROABS 4.4  HGB 11.0*  HCT 33.7*  MCV 89.2  PLT 449*   Basic Metabolic Panel: Recent Labs  Lab 07/08/22 0915 07/09/22 0436  NA 140 143  K 3.7 3.9  CL 109 111  CO2 22 26  GLUCOSE 99 93  BUN 19 14  CREATININE 1.30* 0.96  CALCIUM 9.0 8.7*   GFR: Estimated Creatinine Clearance: 52.8 mL/min (by C-G formula based on SCr of 0.96 mg/dL). Liver Function Tests: Recent Labs  Lab 07/08/22 0915 07/09/22 0436  AST 63* 36  ALT 32 24  ALKPHOS 172* 94  BILITOT 0.6 0.7  PROT 6.4* 5.8*  ALBUMIN 3.2* 2.7*   No results for input(s): "LIPASE", "AMYLASE" in the last 168 hours. No results for input(s): "AMMONIA" in the last 168 hours. Coagulation Profile: Recent Labs  Lab 07/08/22 0915 07/09/22 0436  INR 0.9 1.0   Cardiac Enzymes: No results for input(s): "CKTOTAL", "CKMB", "CKMBINDEX", "TROPONINI" in the last 168 hours. BNP (last 3 results) No results for input(s): "PROBNP" in the last 8760 hours. HbA1C: No results for input(s): "HGBA1C" in the last 72 hours. CBG: Recent Labs  Lab 07/09/22 1243 07/09/22 2032 07/10/22 2106 07/11/22 0808 07/11/22 1203  GLUCAP 99 112* 91 94 111*   Lipid Profile: No results for input(s): "CHOL", "HDL", "LDLCALC", "TRIG", "CHOLHDL", "LDLDIRECT" in the last 72 hours. Thyroid Function Tests: Recent Labs    07/10/22 0450  TSH 1.270   Anemia Panel: No results for input(s): "VITAMINB12", "FOLATE", "FERRITIN", "TIBC", "IRON", "RETICCTPCT" in the last 72 hours. Urine analysis:    Component Value Date/Time   COLORURINE YELLOW (A) 07/08/2022 0920   APPEARANCEUR HAZY (A) 07/08/2022 0920   LABSPEC 1.019 07/08/2022  0920   PHURINE 5.0 07/08/2022 0920   GLUCOSEU NEGATIVE 07/08/2022 0920   HGBUR SMALL (A) 07/08/2022 0920   BILIRUBINUR NEGATIVE 07/08/2022 0920   KETONESUR NEGATIVE 07/08/2022 0920   PROTEINUR 100 (A) 07/08/2022 0920   UROBILINOGEN 0.2 05/26/2015 0722   NITRITE NEGATIVE 07/08/2022 0920   LEUKOCYTESUR NEGATIVE 07/08/2022 0920   Sepsis Labs: '@LABRCNTIP'$ (procalcitonin:4,lacticidven:4)  ) Recent Results (from the past 240 hour(s))  Resp Panel by RT-PCR (Flu A&B, Covid) Anterior Nasal Swab     Status: None   Collection Time: 07/08/22  9:15 AM   Specimen: Anterior Nasal Swab  Result Value Ref Range Status   SARS Coronavirus 2 by RT PCR NEGATIVE NEGATIVE Final    Comment: (NOTE) SARS-CoV-2 target nucleic acids are NOT DETECTED.  The SARS-CoV-2 RNA is generally detectable in upper respiratory specimens during the acute phase of infection. The lowest concentration of SARS-CoV-2 viral copies this assay can detect is 138 copies/mL. A negative result does not preclude SARS-Cov-2 infection  and should not be used as the sole basis for treatment or other patient management decisions. A negative result may occur with  improper specimen collection/handling, submission of specimen other than nasopharyngeal swab, presence of viral mutation(s) within the areas targeted by this assay, and inadequate number of viral copies(<138 copies/mL). A negative result must be combined with clinical observations, patient history, and epidemiological information. The expected result is Negative.  Fact Sheet for Patients:  EntrepreneurPulse.com.au  Fact Sheet for Healthcare Providers:  IncredibleEmployment.be  This test is no t yet approved or cleared by the [place] FDA and  has been authorized for detection and/or diagnosis of SARS-CoV-2 by FDA under an Emergency Use Authorization (EUA). This EUA will remain  in effect (meaning this test can be used) for the  duration of the COVID-19 declaration under Section 564(b)(1) of the Act, 21 U.S.C.section 360bbb-3(b)(1), unless the authorization is terminated  or revoked sooner.       Influenza A by PCR NEGATIVE NEGATIVE Final   Influenza B by PCR NEGATIVE NEGATIVE Final    Comment: (NOTE) The Xpert Xpress SARS-CoV-2/FLU/RSV plus assay is intended as an aid in the diagnosis of influenza from Nasopharyngeal swab specimens and should not be used as a sole basis for treatment. Nasal washings and aspirates are unacceptable for Xpert Xpress SARS-CoV-2/FLU/RSV testing.  Fact Sheet for Patients: EntrepreneurPulse.com.au  Fact Sheet for Healthcare Providers: IncredibleEmployment.be  This test is not yet approved or cleared by the [place] FDA and has been authorized for detection and/or diagnosis of SARS-CoV-2 by FDA under an Emergency Use Authorization (EUA). This EUA will remain in effect (meaning this test can be used) for the duration of the COVID-19 declaration under Section 564(b)(1) of the Act, 21 U.S.C. section 360bbb-3(b)(1), unless the authorization is terminated or revoked.  Performed at [facility], [address] [place], [place] [address]   Blood Culture (routine x 2)     Status: Abnormal   Collection Time: 07/08/22  9:15 AM   Specimen: BLOOD  Result Value Ref Range Status   Specimen Description   Final    BLOOD BLOOD LEFT WRIST Performed at [facility], [address] [place], [place] [address]    Special Requests   Final    BOTTLES DRAWN AEROBIC AND ANAEROBIC Blood Culture adequate volume Performed at [facility], [address] [place], [place] [address]    Culture  Setup Time   Final    GRAM NEGATIVE RODS IN BOTH AEROBIC AND ANAEROBIC BOTTLES CRITICAL RESULT CALLED TO, READ BACK BY AND VERIFIED WITH: JASON ROBBINS 07/09/22 0703 MW    Culture (A)  Final    PANTOEA SPECIES SUSCEPTIBILITIES  PERFORMED ON PREVIOUS CULTURE WITHIN THE LAST 5 DAYS. Performed at [facility], [place] [address] [place], [place] [address]    Report Status 07/11/2022 FINAL  Final  Blood Culture (routine x 2)     Status: Abnormal   Collection Time: 07/08/22  9:15 AM   Specimen: BLOOD  Result Value Ref Range Status   Specimen Description   Final    BLOOD BLOOD RIGHT FOREARM Performed at [facility], [address] [place], [place] [address]    Special Requests   Final    BOTTLES DRAWN AEROBIC AND ANAEROBIC Blood Culture results may not be optimal due to an excessive volume of blood received in culture bottles Performed at [facility], [address], [place],  [address]    Culture  Setup  Time   Final    GRAM NEGATIVE RODS IN BOTH AEROBIC AND ANAEROBIC BOTTLES CRITICAL RESULT CALLED TO, READ BACK BY AND VERIFIED WITH: JASON ROBBINS 07/09/22 0703 MW Performed at [facility], [address] [place], [place] [address]    Culture PANTOEA SPECIES (A)  Final   Report Status 07/11/2022 FINAL  Final   Organism ID, Bacteria PANTOEA SPECIES  Final      Susceptibility   Pantoea species - MIC*    CEFAZOLIN >=64 RESISTANT Resistant     CEFEPIME <=0.12 SENSITIVE Sensitive     CEFTAZIDIME <=1 SENSITIVE Sensitive     CEFTRIAXONE <=0.25 SENSITIVE Sensitive     CIPROFLOXACIN <=0.25 SENSITIVE Sensitive     GENTAMICIN <=1 SENSITIVE Sensitive     IMIPENEM <=0.25 SENSITIVE Sensitive     TRIMETH/SULFA <=20 SENSITIVE Sensitive     PIP/TAZO <=4 SENSITIVE Sensitive     * PANTOEA SPECIES  Blood Culture ID Panel (Reflexed)     Status: Abnormal   Collection Time: 07/08/22  9:15 AM  Result Value Ref Range Status   Enterococcus faecalis NOT DETECTED NOT DETECTED Final   Enterococcus Faecium NOT DETECTED NOT DETECTED Final   Listeria monocytogenes NOT DETECTED NOT DETECTED Final   Staphylococcus species NOT DETECTED NOT DETECTED Final   Staphylococcus aureus (BCID) NOT  DETECTED NOT DETECTED Final   Staphylococcus epidermidis NOT DETECTED NOT DETECTED Final   Staphylococcus lugdunensis NOT DETECTED NOT DETECTED Final   Streptococcus species NOT DETECTED NOT DETECTED Final   Streptococcus agalactiae NOT DETECTED NOT DETECTED Final   Streptococcus pneumoniae NOT DETECTED NOT DETECTED Final   Streptococcus pyogenes NOT DETECTED NOT DETECTED Final   A.calcoaceticus-baumannii NOT DETECTED NOT DETECTED Final   Bacteroides fragilis NOT DETECTED NOT DETECTED Final   Enterobacterales DETECTED (A) NOT DETECTED Final    Comment: Enterobacterales represent a large order of gram negative bacteria, not a single organism. Refer to culture for further identification. CRITICAL RESULT CALLED TO, READ BACK BY AND VERIFIED WITH: JASON ROBBINS 07/09/22 0703 MW    Enterobacter cloacae complex NOT DETECTED NOT DETECTED Final   Escherichia coli NOT DETECTED NOT DETECTED Final   Klebsiella aerogenes NOT DETECTED NOT DETECTED Final   Klebsiella oxytoca NOT DETECTED NOT DETECTED Final   Klebsiella pneumoniae NOT DETECTED NOT DETECTED Final   Proteus species NOT DETECTED NOT DETECTED Final   Salmonella species NOT DETECTED NOT DETECTED Final   Serratia marcescens NOT DETECTED NOT DETECTED Final   Haemophilus influenzae NOT DETECTED NOT DETECTED Final   Neisseria meningitidis NOT DETECTED NOT DETECTED Final   Pseudomonas aeruginosa NOT DETECTED NOT DETECTED Final   Stenotrophomonas maltophilia NOT DETECTED NOT DETECTED Final   Candida albicans NOT DETECTED NOT DETECTED Final   Candida auris NOT DETECTED NOT DETECTED Final   Candida glabrata NOT DETECTED NOT DETECTED Final   Candida krusei NOT DETECTED NOT DETECTED Final   Candida parapsilosis NOT DETECTED NOT DETECTED Final   Candida tropicalis NOT DETECTED NOT DETECTED Final   Cryptococcus neoformans/gattii NOT DETECTED NOT DETECTED Final   CTX-M ESBL NOT DETECTED NOT DETECTED Final   Carbapenem resistance IMP NOT DETECTED NOT  DETECTED Final   Carbapenem resistance KPC NOT DETECTED NOT DETECTED Final   Carbapenem resistance NDM NOT DETECTED NOT DETECTED Final   Carbapenem resist OXA 48 LIKE NOT DETECTED NOT DETECTED Final   Carbapenem resistance VIM NOT DETECTED NOT DETECTED Final    Comment: Performed at [facility], [address] [place], [place] [address]  Urine Culture     Status: None   Collection Time: 07/08/22  9:20 AM   Specimen: In/Out Cath Urine  Result Value Ref Range Status   Specimen Description   Final    IN/OUT CATH URINE Performed at [facility], [address], [place], [place] [address]    Special Requests   Final    NONE Performed at [facility], [name], [place], [place] [address]    Culture   Final    NO GROWTH Performed at [facility], [place] [address], [place], [place] [address]    Report Status 07/09/2022 FINAL  Final  Respiratory (~20 pathogens) panel by PCR     Status: None   Collection Time: 07/08/22  1:00 PM   Specimen: Nasopharyngeal Swab; Respiratory  Result Value Ref Range Status   Adenovirus NOT DETECTED NOT DETECTED Final   Coronavirus 229E NOT DETECTED NOT DETECTED Final    Comment: (NOTE) The Coronavirus on the Respiratory Panel, DOES NOT test for the novel  Coronavirus (2019 nCoV)    Coronavirus HKU1 NOT DETECTED NOT DETECTED Final   Coronavirus NL63 NOT DETECTED NOT DETECTED Final   Coronavirus OC43 NOT DETECTED NOT DETECTED Final   Metapneumovirus NOT DETECTED NOT DETECTED Final   Rhinovirus / Enterovirus NOT DETECTED NOT DETECTED Final   Influenza A NOT DETECTED NOT DETECTED Final   Influenza B NOT DETECTED NOT DETECTED Final   Parainfluenza Virus 1 NOT DETECTED NOT DETECTED Final   Parainfluenza Virus 2 NOT DETECTED NOT DETECTED Final   Parainfluenza Virus 3 NOT DETECTED NOT DETECTED Final   Parainfluenza Virus 4 NOT DETECTED NOT DETECTED Final   Respiratory Syncytial Virus NOT DETECTED NOT DETECTED Final    Bordetella pertussis NOT DETECTED NOT DETECTED Final   Bordetella Parapertussis NOT DETECTED NOT DETECTED Final   Chlamydophila pneumoniae NOT DETECTED NOT DETECTED Final   Mycoplasma pneumoniae NOT DETECTED NOT DETECTED Final    Comment: Performed at [name], [place] [address], [place], [place] [address]  Culture, blood (Routine X 2) w Reflex to ID Panel     Status: None (Preliminary result)   Collection Time: 07/10/22  4:50 AM   Specimen: BLOOD  Result Value Ref Range Status   Specimen Description BLOOD RIGHT ARM  Final   Special Requests   Final    BOTTLES DRAWN AEROBIC AND ANAEROBIC Blood Culture adequate volume   Culture   Final    NO GROWTH 2 DAYS Performed at [facility], [address] [place], [place] [address]    Report Status PENDING  Incomplete  Culture, blood (Routine X 2) w Reflex to ID Panel     Status: None (Preliminary result)   Collection Time: 07/10/22  4:50 AM   Specimen: BLOOD  Result Value Ref Range Status   Specimen Description BLOOD RIGHT HAND  Final   Special Requests   Final    BOTTLES DRAWN AEROBIC AND ANAEROBIC Blood Culture adequate volume   Culture   Final    NO GROWTH 2 DAYS Performed at [facility], [address] [place], [place] [address]    Report Status PENDING  Incomplete         Radiology Studies: No results found.      Scheduled Meds:  amphetamine-dextroamphetamine  10 mg Oral BID   cariprazine  4.5 mg Oral Daily   DULoxetine  30 mg Oral BID   enoxaparin (LOVENOX) injection  40 mg Subcutaneous Q24H  famotidine  20 mg Oral BID   folic acid  1 mg Oral Daily   gabapentin  300 mg Oral TID   influenza vac split quadrivalent PF  0.5 mL Intramuscular Tomorrow-1000   multivitamin with minerals  1 tablet Oral Daily   pneumococcal 20-valent conjugate vaccine  0.5 mL Intramuscular Tomorrow-1000   thiamine  100 mg Oral Daily   Or   thiamine  100 mg Intravenous Daily   Continuous Infusions:   cefTRIAXone (ROCEPHIN)  IV 2 g (07/12/22 0918)     LOS: 4 days     Desma Maxim, MD [facility]   If 7PM-7AM, please contact night-coverage [email or web address] Password [email or web address] 07/12/2022, 1:39 PM

## 2022-07-13 ENCOUNTER — Encounter: Payer: Self-pay | Admitting: Internal Medicine

## 2022-07-13 ENCOUNTER — Inpatient Hospital Stay: Payer: Medicare Other | Admitting: Anesthesiology

## 2022-07-13 ENCOUNTER — Inpatient Hospital Stay: Payer: Medicare Other

## 2022-07-13 ENCOUNTER — Inpatient Hospital Stay (HOSPITAL_COMMUNITY)
Admit: 2022-07-13 | Discharge: 2022-07-13 | Disposition: A | Discharge status: Home / Self Care | Payer: Medicare Other | Attending: Cardiovascular Disease | Admitting: Cardiovascular Disease

## 2022-07-13 ENCOUNTER — Encounter
Admission: EM | Disposition: A | Discharge status: Home / Self Care | Payer: Self-pay | Source: Home / Self Care | Attending: Obstetrics and Gynecology

## 2022-07-13 DIAGNOSIS — R7881 Bacteremia: Secondary | ICD-10-CM

## 2022-07-13 DIAGNOSIS — I340 Nonrheumatic mitral (valve) insufficiency: Secondary | ICD-10-CM

## 2022-07-13 DIAGNOSIS — I351 Nonrheumatic aortic (valve) insufficiency: Secondary | ICD-10-CM

## 2022-07-13 DIAGNOSIS — I361 Nonrheumatic tricuspid (valve) insufficiency: Secondary | ICD-10-CM

## 2022-07-13 HISTORY — PX: TEE WITHOUT CARDIOVERSION: SHX5443

## 2022-07-13 LAB — COMPREHENSIVE METABOLIC PANEL
ALT: 23 U/L (ref 0–44)
AST: 40 U/L (ref 15–41)
Albumin: 3.4 g/dL — ABNORMAL LOW (ref 3.5–5.0)
Alkaline Phosphatase: 78 U/L (ref 38–126)
Anion gap: 11 (ref 5–15)
BUN: 13 mg/dL (ref 6–20)
CO2: 25 mmol/L (ref 22–32)
Calcium: 9.3 mg/dL (ref 8.9–10.3)
Chloride: 103 mmol/L (ref 98–111)
Creatinine, Ser: 0.78 mg/dL (ref 0.44–1.00)
GFR, Estimated: >60 mL/min (ref >60)
Glucose, Bld: 86 mg/dL (ref 70–99)
Potassium: 4.9 mmol/L (ref 3.5–5.1)
Sodium: 139 mmol/L (ref 135–145)
Total Bilirubin: 0.5 mg/dL (ref 0.3–1.2)
Total Protein: 7.1 g/dL (ref 6.5–8.1)

## 2022-07-13 LAB — C-REACTIVE PROTEIN: CRP: 1.8 mg/dL — ABNORMAL HIGH (ref <1.0)

## 2022-07-13 LAB — ANA W/REFLEX IF POSITIVE: Anti Nuclear Antibody (ANA): NEGATIVE

## 2022-07-13 LAB — SEDIMENTATION RATE: Sed Rate: 60 mm/hr — ABNORMAL HIGH (ref 0–30)

## 2022-07-13 SURGERY — ECHOCARDIOGRAM, TRANSESOPHAGEAL
Anesthesia: Moderate Sedation

## 2022-07-13 SURGERY — ECHOCARDIOGRAM, TRANSESOPHAGEAL
Anesthesia: General

## 2022-07-13 MED ORDER — SULFAMETHOXAZOLE-TRIMETHOPRIM 800-160 MG PO TABS: 1.0000 | ORAL_TABLET | Freq: Three times a day (TID) | ORAL | 0 refills | Status: DC

## 2022-07-13 MED ORDER — PROPOFOL 10 MG/ML IV BOLUS
INTRAVENOUS | Status: DC | PRN
  Administered 2022-07-13: 40 mg via INTRAVENOUS
  Administered 2022-07-13: 30 mg via INTRAVENOUS
  Administered 2022-07-13: 80 mg via INTRAVENOUS
  Administered 2022-07-13: 20 mg via INTRAVENOUS

## 2022-07-13 MED ORDER — SULFAMETHOXAZOLE-TRIMETHOPRIM 800-160 MG PO TABS: 1.0000 | ORAL_TABLET | Freq: Three times a day (TID) | ORAL | 0 refills | Status: AC

## 2022-07-13 MED ORDER — IOHEXOL 300 MG/ML  SOLN
100.0000 mL | Freq: Once | INTRAMUSCULAR | Status: AC | PRN
  Administered 2022-07-13: 100 mL via INTRAVENOUS

## 2022-07-13 MED ORDER — IOHEXOL 9 MG/ML PO SOLN
500.0000 mL | Freq: Once | ORAL | Status: DC | PRN
  Administered 2022-07-13: 500 mL via ORAL

## 2022-07-13 MED ORDER — LEVOFLOXACIN 750 MG PO TABS: 750.0000 mg | ORAL_TABLET | Freq: Every day | ORAL | 0 refills | Status: DC

## 2022-07-13 MED ORDER — ONDANSETRON HCL 4 MG/2ML IJ SOLN: 4.0000 mg | Freq: Once | INTRAMUSCULAR | Status: DC | PRN

## 2022-07-13 MED ORDER — DEXMEDETOMIDINE HCL IN NACL 80 MCG/20ML IV SOLN
INTRAVENOUS | Status: AC
  Filled 2022-07-13: qty 20

## 2022-07-13 MED ORDER — BUTAMBEN-TETRACAINE-BENZOCAINE 2-2-14 % EX AERO
INHALATION_SPRAY | CUTANEOUS | Status: AC
  Filled 2022-07-13: qty 5

## 2022-07-13 MED ORDER — PROPOFOL 10 MG/ML IV BOLUS
INTRAVENOUS | Status: AC
  Filled 2022-07-13: qty 40

## 2022-07-13 MED ORDER — SODIUM CHLORIDE FLUSH 0.9 % IV SOLN
INTRAVENOUS | Status: AC
  Filled 2022-07-13: qty 10

## 2022-07-13 MED ORDER — LIDOCAINE VISCOUS HCL 2 % MT SOLN
OROMUCOSAL | Status: AC
  Filled 2022-07-13: qty 15

## 2022-07-13 MED ORDER — FENTANYL CITRATE (PF) 100 MCG/2ML IJ SOLN: 25.0000 ug | INTRAMUSCULAR | Status: DC | PRN

## 2022-07-13 MED ORDER — DEXMEDETOMIDINE HCL IN NACL 200 MCG/50ML IV SOLN
INTRAVENOUS | Status: DC | PRN
  Administered 2022-07-13: 12 ug via INTRAVENOUS
  Administered 2022-07-13: 20 ug via INTRAVENOUS

## 2022-07-13 NOTE — Anesthesia Preprocedure Evaluation (Signed)
Anesthesia Evaluation  Patient identified by MRN, date of birth, ID band Patient awake    Reviewed: Allergy & Precautions, H&P , NPO status , Patient's Chart, lab work & pertinent test results, reviewed documented beta blocker date and time   Airway Mallampati: II   Neck ROM: full    Dental  (+) Poor Dentition   Pulmonary neg pulmonary ROS,    Pulmonary exam normal        Cardiovascular Exercise Tolerance: Poor negative cardio ROS Normal cardiovascular exam Rhythm:regular Rate:Normal     Neuro/Psych Seizures -, Well Controlled,  PSYCHIATRIC DISORDERS Anxiety Depression Bipolar Disorder    GI/Hepatic Neg liver ROS, GERD  Medicated,  Endo/Other  negative endocrine ROS  Renal/GU Renal disease  negative genitourinary   Musculoskeletal   Abdominal   Peds  Hematology  (+) Blood dyscrasia, anemia ,   Anesthesia Other Findings Past Medical History: No date: Abnormal uterine bleeding No date: ADHD (attention deficit hyperactivity disorder) No date: Allergy No date: Anxiety No date: Bipolar 1 disorder (HCC) No date: Bipolar disorder ([place]) No date: Cancer ([place])     Comment:  uterine cancer No date: DDD (degenerative disc disease) No date: Depression No date: HSV infection No date: Hx: UTI (urinary tract infection) No date: Pertussis No date: Seizures ([place])     Comment:  last seizure 12/2014  No date: Thyroid disease Past Surgical History: No date: ABDOMINAL HYSTERECTOMY No date: CERVICAL SPINE SURGERY     Comment:  x 3 2001,2005,2007 c5-6 2003: CESAREAN SECTION 12/16/2017: EMBOLIZATION; N/A     Comment:  Procedure: EMBOLIZATION;  Surgeon: Algernon Huxley, MD;                Location: [facility];  Service: Cardiovascular;              Laterality: N/A; 11/01/2019: ESOPHAGOGASTRODUODENOSCOPY (EGD) WITH PROPOFOL; N/A     Comment:  Procedure: ESOPHAGOGASTRODUODENOSCOPY (EGD) WITH               PROPOFOL;   Surgeon: Robert Bellow, MD;  Location:               [facility];  Service: Endoscopy;  Laterality: N/A;                POSITIVE ON 09/29/19 and 10/06/19 10/11/2017: HYSTEROSCOPY; N/A     Comment:  Procedure: HYSTEROSCOPY;  Surgeon: Harlin Heys,               MD;  Location: [facility];  Service: Gynecology;                Laterality: N/A; 10/11/2017: HYSTEROSCOPY WITH D & C; N/A     Comment:  Procedure: DILATATION AND CURETTAGE /HYSTEROSCOPY;                Surgeon: Harlin Heys, MD;  Location: [facility];                Service: Gynecology;  Laterality: N/A; 12/15/2017: LAPAROSCOPIC BILATERAL SALPINGO OOPHERECTOMY; Bilateral     Comment:  Procedure: LAPAROSCOPIC BILATERAL SALPINGO OOPHORECTOMY;              Surgeon: Mellody Drown, MD;  Location: [facility];                Service: Gynecology;  Laterality: Bilateral; 12/15/2017: LAPAROSCOPIC HYSTERECTOMY; Bilateral     Comment:  Procedure: HYSTERECTOMY TOTAL LAPAROSCOPIC;  Surgeon:  Mellody Drown, MD;  Location: [facility];  Service:               Gynecology;  Laterality: Bilateral; 12/15/2017: SENTINEL NODE BIOPSY; N/A     Comment:  Procedure: SENTINEL NODE JJHERD,EYCXKGY;  Surgeon:               Mellody Drown, MD;  Location: [facility];  Service:               Gynecology;  Laterality: N/A; No date: TONSILLECTOMY No date: TUBAL LIGATION BMI    Body Mass Index: 23.99 kg/m     Reproductive/Obstetrics negative OB ROS                             Anesthesia Physical Anesthesia Plan  ASA: 3  Anesthesia Plan: General   Post-op Pain Management:    Induction:   PONV Risk Score and Plan:   Airway Management Planned:   Additional Equipment:   Intra-op Plan:   Post-operative Plan:   Informed Consent: I have reviewed the patients History and Physical, chart, labs and discussed the procedure including the risks, benefits and alternatives for the proposed anesthesia with the  patient or authorized representative who has indicated his/her understanding and acceptance.     Dental Advisory Given  Plan Discussed with: CRNA  Anesthesia Plan Comments:         Anesthesia Quick Evaluation

## 2022-07-13 NOTE — Discharge Summary (Addendum)
[name] Wearing NIO:[id number] DOB: Dec 01, 1965 DOA: 07/08/2022  PCP: Sandi Mariscal, MD  Admit date: 07/08/2022 Discharge date: 07/13/2022  Time spent: 40 minutes  Recommendations for Outpatient Follow-up:  ID f/u 9/26 Pcp f/u  F/u DHEAS (ordered and pending, see below)    Discharge Diagnoses:  Principal Problem:   Gram-negative bacteremia Active Problems:   Sepsis ([place])   Back pain   AKI (acute kidney injury) ([place])   Bipolar disorder ([place])   Alcoholism ([place])   GERD   Opioid dependence ([place])   IVDU (intravenous drug user)   Bacteremia   Discharge Condition: stable  Diet recommendation: regular  Filed Weights   07/08/22 1400  Weight: 57.6 kg    History of present illness:  From admission h and p [name] is a 56 y.o. female with medical history significant for Bipolar disorder, anxiety, depression, thyroid disease who presents to the ER for evaluation of fever and shortness of breath. Patient states that she has had symptoms intermittently over months which she describes as fever, chills, generalized fatigue and confusion.  She also complains of worsening low back pain mostly in the lower lumbar region.  She has a history of chronic back pain and states that it is worse today.  She rates her pain an 8 x 10 in intensity at its worst and denies having any associated lower extremity weakness, no urinary or fecal incontinence and denies having saddle anesthesia.  She denies any recent trauma.  She complains of shortness of breath but denies having any cough or chest pain. She denies having any nausea, no vomiting, no abdominal pain, no dizziness, no lightheadedness, no headache, no blurred vision no focal deficit. She had a fever with a Tmax of 103 upon arrival to the ER, she was tachycardic and tachypneic.  Lactic acid was elevated at 2.8.  Slightly elevated AST level at 63 and an AKI with increase in her serum creatinine from 0.9-1.3.  Hospital Course:   #  Sepsis 2/2 bacteremia, by fever and tachycardia and lactic acidosis. Lactate normalized w/ fluids, hemodynamically stable. Sepsis phsyiology resolved.   # [place] bacteremia Secondary to IVDU, no other clear sources of infection. TTE/TEE no vegetations. MRI c/t/l spine negative. CT abdomen/pelvis no infectious complications - treated with cefepime, then ceftriaxone, ID advising levofloxacin through 9/25   # IVDU Did not admit to this on presentation but now admits to occasional IVDU (opioids). Hiv, hbv, hcv, rpr neg - TOC consulted for substance abuse resources   # Low cortisol - acth stim equivocal, will f/u dheas   # Joint pain Reports of joint pain and swelling multiple finger joints and legs. Nothing obvious on exam. ANA and RF negative   # Chronic pain # Opioid dependence - cont home oxy, gabapentin, flexeril   # Bipolar - cont home cariprazine   # ADHD - cont home adderall   # Alcohol use Reports couple of drinks a day, no signs withdrawal  Procedures: none   Consultations: ID  Discharge Exam: Vitals:   07/13/22 0839 07/13/22 0859  BP: 108/64 (!) 109/57  Pulse: 62 62  Resp: 12 16  Temp:  97.7 F (36.5 C)  SpO2: 95% 96%    General: NAD Cardiovascular: RRR Respiratory: CTAB  Discharge Instructions   Discharge Instructions     Diet - low sodium heart healthy   Complete by: As directed    Increase activity slowly   Complete by: As directed       Allergies as  of 07/13/2022       Reactions   Naproxen Anaphylaxis   Morphine And Related Itching        Medication List     STOP taking these medications    aspirin EC 81 MG tablet   guaiFENesin-dextromethorphan 100-10 MG/5ML syrup Commonly known as: ROBITUSSIN DM   traZODone 100 MG tablet Commonly known as: DESYREL       TAKE these medications    acetaminophen 500 MG tablet Commonly known as: TYLENOL Take 500-1,000 mg by mouth every 6 (six) hours as needed for mild pain or  headache.   amphetamine-dextroamphetamine 10 MG tablet Commonly known as: ADDERALL Take 10 mg by mouth 2 (two) times daily.   cyclobenzaprine 10 MG tablet Commonly known as: FLEXERIL Take 10 mg by mouth 3 (three) times daily as needed for muscle spasms.   diphenhydrAMINE 25 mg capsule Commonly known as: Benadryl Allergy Take 1 capsule (25 mg total) by mouth every 6 (six) hours as needed for up to 5 days.   DULoxetine 30 MG capsule Commonly known as: CYMBALTA Take 30 mg by mouth 2 (two) times daily.   EPINEPHrine 0.3 mg/0.3 mL Soaj injection Commonly known as: EPI-PEN Inject 0.3 mg into the muscle once as needed (severe allergic reaction).   famotidine 20 MG tablet Commonly known as: PEPCID Take 1 tablet (20 mg total) by mouth 2 (two) times daily for 5 days.   gabapentin 300 MG capsule Commonly known as: NEURONTIN Take 300 mg by mouth 3 (three) times daily.   Oxycodone HCl 10 MG Tabs Take 10 mg by mouth 4 (four) times daily as needed (moderate to severe pain).   sulfamethoxazole-trimethoprim 800-160 MG tablet Commonly known as: BACTRIM DS Take 1 tablet by mouth 3 (three) times daily for 7 days. Start taking on: July 14, 2022   Vraylar 4.5 MG Caps Generic drug: Cariprazine HCl Take by mouth.       Allergies  Allergen Reactions   Naproxen Anaphylaxis   Morphine And Related Itching    Follow-up Information     Sandi Mariscal, MD. Schedule an appointment as soon as possible for a visit.   Specialty: Internal Medicine Contact information: [place] [address] [phone]                  The results of significant diagnostics from this hospitalization (including imaging, microbiology, ancillary and laboratory) are listed below for reference.    Significant Diagnostic Studies: CT ABDOMEN PELVIS W CONTRAST  Result Date: 07/13/2022 CLINICAL DATA:  Bacteremia EXAM: CT ABDOMEN AND PELVIS WITH CONTRAST TECHNIQUE: Multidetector CT  imaging of the abdomen and pelvis was performed using the standard protocol following bolus administration of intravenous contrast. RADIATION DOSE REDUCTION: This exam was performed according to the departmental dose-optimization program which includes automated exposure control, adjustment of the mA and/or kV according to patient size and/or use of iterative reconstruction technique. CONTRAST:  16m OMNIPAQUE IOHEXOL 300 MG/ML  SOLN COMPARISON:  01/04/2018 FINDINGS: Lower chest: Trace bilateral pleural effusions. Dependent bibasilar atelectasis. Hepatobiliary: No focal hepatic abnormality. Gallbladder unremarkable. Pancreas: No focal abnormality or ductal dilatation. Spleen: No focal abnormality.  Normal size. Adrenals/Urinary Tract: No adrenal abnormality. No focal renal abnormality. No stones or hydronephrosis. Urinary bladder is unremarkable. Stomach/Bowel: Moderate stool burden throughout the colon. Stomach, large and small bowel grossly unremarkable. Vascular/Lymphatic: No evidence of aneurysm or adenopathy. Reproductive: Prior hysterectomy.  No adnexal masses. Other: Trace free fluid in the cul-de-sac.  No free air. Musculoskeletal:  No acute bony abnormality. IMPRESSION: Trace bilateral pleural effusions with bibasilar atelectasis. Moderate stool burden in the colon. Trace free fluid in the cul-de-sac. Otherwise no acute findings in the abdomen or pelvis. Electronically Signed   By: Rolm Baptise M.D.   On: 07/13/2022 13:02   ECHOCARDIOGRAM COMPLETE BUBBLE STUDY  Result Date: 07/10/2022    ECHOCARDIOGRAM REPORT   Patient Name:   [name] Date of Exam: 07/09/2022 Medical Rec #:  [id number]           Height:       61.0 in Accession #:    [id number]          Weight:       127.0 lb Date of Birth:  Mar 28, 1966           BSA:          1.557 m Patient Age:    56 years            BP:           128/79 mmHg Patient Gender: F                   HR:           74 bpm. Exam Location:  [facility] Procedure: 2D Echo,  Cardiac Doppler, Color Doppler and Saline Contrast Bubble            Study Indications:     Bacteremia  History:         Patient has prior history of Echocardiogram examinations, most                  recent 11/21/2015. Signs/Symptoms:Syncope. Anxiety.  Sonographer:     Sherrie Sport Referring Phys:  Ailene Rud IEPP Diagnosing Phys: Nathalie  1. Left ventricular ejection fraction, by estimation, is 60 to 65%. The left ventricle has normal function. The left ventricle has no regional wall motion abnormalities. There is mild left ventricular hypertrophy. Left ventricular diastolic parameters were normal.  2. Right ventricular systolic function is normal. The right ventricular size is normal.  3. Left atrial size was severely dilated.  4. Right atrial size was moderately dilated.  5. The mitral valve is normal in structure. Mild to moderate mitral valve regurgitation. No evidence of mitral stenosis.  6. Tricuspid valve regurgitation is mild to moderate.  7. The aortic valve is normal in structure. Aortic valve regurgitation is moderate. No aortic stenosis is present.  8. The inferior vena cava is normal in size with greater than 50% respiratory variability, suggesting right atrial pressure of 3 mmHg. FINDINGS  Left Ventricle: Left ventricular ejection fraction, by estimation, is 60 to 65%. The left ventricle has normal function. The left ventricle has no regional wall motion abnormalities. The left ventricular internal cavity size was normal in size. There is  mild left ventricular hypertrophy. Left ventricular diastolic parameters were normal. Right Ventricle: The right ventricular size is normal. No increase in right ventricular wall thickness. Right ventricular systolic function is normal. Left Atrium: Left atrial size was severely dilated. Right Atrium: Right atrial size was moderately dilated. Pericardium: There is no evidence of pericardial effusion. Mitral Valve: The mitral valve is normal in  structure. Mild to moderate mitral valve regurgitation. No evidence of mitral valve stenosis. Tricuspid Valve: The tricuspid valve is normal in structure. Tricuspid valve regurgitation is mild to moderate. No evidence of tricuspid stenosis. Aortic Valve: The aortic valve is normal in structure. Aortic valve regurgitation is  moderate. Aortic regurgitation PHT measures 399 msec. No aortic stenosis is present. Aortic valve mean gradient measures 3.0 mmHg. Aortic valve peak gradient measures 5.3 mmHg. Aortic valve area, by VTI measures 2.30 cm. Pulmonic Valve: The pulmonic valve was normal in structure. Pulmonic valve regurgitation is mild. No evidence of pulmonic stenosis. Aorta: The aortic root is normal in size and structure. Venous: The inferior vena cava is normal in size with greater than 50% respiratory variability, suggesting right atrial pressure of 3 mmHg. IAS/Shunts: No atrial level shunt detected by color flow Doppler. Agitated saline contrast was given intravenously to evaluate for intracardiac shunting.  LEFT VENTRICLE PLAX 2D LVIDd:         4.20 cm   Diastology LVIDs:         2.80 cm   LV e' medial:    12.50 cm/s LV PW:         1.00 cm   LV E/e' medial:  8.1 LV IVS:        1.00 cm   LV e' lateral:   10.10 cm/s LVOT diam:     1.80 cm   LV E/e' lateral: 10.0 LV SV:         53 LV SV Index:   34 LVOT Area:     2.54 cm  RIGHT VENTRICLE RV Basal diam:  3.10 cm RV Mid diam:    2.00 cm RV S prime:     15.30 cm/s TAPSE (M-mode): 2.8 cm LEFT ATRIUM             Index        RIGHT ATRIUM           Index LA diam:        3.30 cm 2.12 cm/m   RA Area:     12.30 cm LA Vol (A2C):   53.2 ml 34.16 ml/m  RA Volume:   25.60 ml  16.44 ml/m LA Vol (A4C):   39.3 ml 25.24 ml/m LA Biplane Vol: 48.7 ml 31.27 ml/m  AORTIC VALVE                    PULMONIC VALVE AV Area (Vmax):    2.28 cm     PR End Diast Vel: 6.76 msec AV Area (Vmean):   2.20 cm AV Area (VTI):     2.30 cm AV Vmax:           115.00 cm/s AV Vmean:           78.600 cm/s AV VTI:            0.230 m AV Peak Grad:      5.3 mmHg AV Mean Grad:      3.0 mmHg LVOT Vmax:         103.00 cm/s LVOT Vmean:        68.100 cm/s LVOT VTI:          0.208 m LVOT/AV VTI ratio: 0.90 AI PHT:            399 msec  AORTA Ao Root diam: 3.00 cm MITRAL VALVE                TRICUSPID VALVE MV Area (PHT): 4.86 cm     TR Peak grad:   29.4 mmHg MV Decel Time: 156 msec     TR Vmax:        271.00 cm/s MV E velocity: 101.00 cm/s MV A velocity: 89.10 cm/s   SHUNTS MV E/A  ratio:  1.13         Systemic VTI:  0.21 m                             Systemic Diam: 1.80 cm Neoma Laming Electronically signed by Neoma Laming Signature Date/Time: 07/10/2022/9:25:00 AM    Final    US Venous Img Lower Bilateral (DVT)  Result Date: 07/08/2022 CLINICAL DATA:  Short of breath and fever.  Leg swelling EXAM: BILATERAL LOWER EXTREMITY VENOUS DOPPLER ULTRASOUND TECHNIQUE: Gray-scale sonography with graded compression, as well as color Doppler and duplex ultrasound were performed to evaluate the lower extremity deep venous systems from the level of the common femoral vein and including the common femoral, femoral, profunda femoral, popliteal and calf veins including the posterior tibial, peroneal and gastrocnemius veins when visible. The superficial great saphenous vein was also interrogated. Spectral Doppler was utilized to evaluate flow at rest and with distal augmentation maneuvers in the common femoral, femoral and popliteal veins. COMPARISON:  None Available. FINDINGS: RIGHT LOWER EXTREMITY Common Femoral Vein: No evidence of thrombus. Normal compressibility, respiratory phasicity and response to augmentation. Saphenofemoral Junction: No evidence of thrombus. Normal compressibility and flow on color Doppler imaging. Profunda Femoral Vein: No evidence of thrombus. Normal compressibility and flow on color Doppler imaging. Femoral Vein: No evidence of thrombus. Normal compressibility, respiratory phasicity and response to  augmentation. Popliteal Vein: No evidence of thrombus. Normal compressibility, respiratory phasicity and response to augmentation. Calf Veins: No evidence of thrombus. Normal compressibility and flow on color Doppler imaging. Superficial Great Saphenous Vein: No evidence of thrombus. Normal compressibility. Venous Reflux:  None. Other Findings:  None. LEFT LOWER EXTREMITY Common Femoral Vein: No evidence of thrombus. Normal compressibility, respiratory phasicity and response to augmentation. Saphenofemoral Junction: No evidence of thrombus. Normal compressibility and flow on color Doppler imaging. Profunda Femoral Vein: No evidence of thrombus. Normal compressibility and flow on color Doppler imaging. Femoral Vein: No evidence of thrombus. Normal compressibility, respiratory phasicity and response to augmentation. Popliteal Vein: No evidence of thrombus. Normal compressibility, respiratory phasicity and response to augmentation. Calf Veins: No evidence of thrombus. Normal compressibility and flow on color Doppler imaging. Superficial Great Saphenous Vein: No evidence of thrombus. Normal compressibility. Venous Reflux:  None. Other Findings:  None. IMPRESSION: No evidence of deep venous thrombosis in either lower extremity. Electronically Signed   By: Franchot Gallo M.D.   On: 07/08/2022 15:44   CT Angio Chest Pulmonary Embolism (PE) W or WO Contrast  Result Date: 07/08/2022 CLINICAL DATA:  Shortness of breath, fever and confusion. EXAM: CT ANGIOGRAPHY CHEST WITH CONTRAST TECHNIQUE: Multidetector CT imaging of the chest was performed using the standard protocol during bolus administration of intravenous contrast. Multiplanar CT image reconstructions and MIPs were obtained to evaluate the vascular anatomy. RADIATION DOSE REDUCTION: This exam was performed according to the departmental dose-optimization program which includes automated exposure control, adjustment of the mA and/or kV according to patient size  and/or use of iterative reconstruction technique. CONTRAST:  43m OMNIPAQUE IOHEXOL 350 MG/ML SOLN COMPARISON:  None Available. FINDINGS: Cardiovascular: The heart is normal in size. No pericardial effusion. The thoracic aorta is normal in caliber. No dissection. Enlargement of the pulmonary arterial trunk and right and left main pulmonary arteries possibly suggesting pulmonary hypertension. The pulmonary arteries are well opacified. No filling defects to suggest pulmonary embolism. Mediastinum/Nodes: Borderline enlarged mediastinal hilar lymph nodes. The esophagus is grossly no. Lungs/Pleura: Bibasilar subpleural atelectasis. No  pulmonary infiltrates or pleural effusions. No worrisome pulmonary lesions or pulmonary nodules. The central tracheobronchial tree is unremarkable. Upper Abdomen: No significant upper abdominal findings Musculoskeletal: No breast masses, supraclavicular or axillary adenopathy. The bony thorax is intact. Cervical fusion hardware is noted. Multilevel thoracic Schmorl's nodes. Review of the MIP images confirms the above findings. IMPRESSION: 1. No CT findings for pulmonary embolism. 2. Normal thoracic aorta. 3. Enlargement of the pulmonary arterial trunk and right and left main pulmonary arteries possibly suggesting pulmonary hypertension. 4. Borderline mediastinal hilar lymph nodes. Recommend follow-up chest CT in 4 months to re-evaluate. 5. Bibasilar subpleural atelectasis but no pulmonary infiltrates or pleural effusions. 6. No worrisome pulmonary lesions. Electronically Signed   By: Marijo Sanes M.D.   On: 07/08/2022 14:43   MR Lumbar Spine W Wo Contrast  Result Date: 07/08/2022 CLINICAL DATA:  Back pain.  Fever and confusion. EXAM: MRI LUMBAR SPINE WITHOUT AND WITH CONTRAST TECHNIQUE: Multiplanar and multiecho pulse sequences of the lumbar spine were obtained without and with intravenous contrast. CONTRAST:  75m GADAVIST GADOBUTROL 1 MMOL/ML IV SOLN COMPARISON:  Radiography  12/12/2015.  MRI 01/04/2014. FINDINGS: Segmentation:  S1 is a transitional vertebra as numbered previously. Alignment: 1-2 mm of degenerative anterolisthesis at L5-S1 and S1-S2. Vertebrae:  No fracture or focal bone lesion. Conus medullaris and cauda equina: Conus extends to the L1-2 level. Conus and cauda equina appear normal. Paraspinal and other soft tissues: Negative Disc levels: L1-2 and L2-3: Normal L3-4: Minimal disc bulge.  No stenosis. L4-5: Minimal disc bulge.  No stenosis. L5-S1: Bilateral facet arthropathy with 1-2 mm of anterolisthesis. Mild bulging of the disc. Small synovial cyst associated with the right facet joint. Stenosis of both lateral recesses that could possibly be symptomatic. Edematous and enhancing changes of the facet joints could relate to back pain or referred facet syndrome pain. The appearance does not look like infectious disease. 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 Minimal bulging of the disc. No compressive stenosis. IMPRESSION: No sign of infection in the lumbar or upper sacral region. S1 is a transitional vertebra. Worsening of bilateral facet degeneration at L5-S1. 1-2 mm of anterolisthesis. Bulging of the disc. Mild stenosis of the lateral recesses that could possibly be symptomatic. Edema and enhancement of the facet joints that could relate to back pain or referred facet syndrome pain. The disease does not look like infectious inflammatory disease however. S1-2 shows chronic facet arthritis with 1-2 mm of anterolisthesis. No stenosis. Electronically Signed   By: MNelson ChimesM.D.   On: 07/08/2022 14:33   MR THORACIC SPINE W WO CONTRAST  Result Date: 07/08/2022 CLINICAL DATA:  Mid back pain EXAM: MRI THORACIC WITHOUT AND WITH CONTRAST TECHNIQUE: Multiplanar and multiecho pulse sequences of the thoracic spine were obtained without and with intravenous contrast. CONTRAST:  642mGADAVIST GADOBUTROL 1 MMOL/ML IV SOLN COMPARISON:   Thoracic spine radiographs 07/12/2009 FINDINGS: Alignment:  Normal alignment.  Mild scoliosis. Vertebrae: Negative for fracture or mass. Disc degeneration and multiple Schmorl's nodes in the mid and lower thoracic spine. Cord:  Normal signal and morphology Paraspinal and other soft tissues: No paraspinous mass or fluid collection Disc levels: Multilevel disc degeneration in the thoracic spine with Schmorl's nodes and mild spurring. Small central disc protrusion T5-6. Negative for thoracic spinal stenosis. IMPRESSION: Thoracic disc degeneration.  No significant stenosis Negative for fracture Electronically Signed   By: ChFranchot Gallo.D.   On: 07/08/2022 14:32   MR Cervical Spine W or Wo  Contrast  Result Date: 07/08/2022 CLINICAL DATA:  Neck pain.  Fever and confusion. EXAM: MRI CERVICAL SPINE WITHOUT AND WITH CONTRAST TECHNIQUE: Multiplanar and multiecho pulse sequences of the cervical spine, to include the craniocervical junction and cervicothoracic junction, were obtained without and with intravenous contrast. CONTRAST:  77m GADAVIST GADOBUTROL 1 MMOL/ML IV SOLN COMPARISON:  Radiography 12/12/2015.  CT 11/19/2015. FINDINGS: Alignment: No significant malalignment. 1-2 mm of degenerative anterolisthesis at C7-T1. Vertebrae: Previous ACDF C4 through C7. No finding to suggest spinal infection. Cord: No cord compression or focal cord lesion. Posterior Fossa, vertebral arteries, paraspinal tissues: Negative Disc levels: Foramen magnum and C1-2 are normal. C2-3: Chronic facet fusion on the right. Wide patency of the canal and foramina. C3-4: Endplate osteophytes and bulging of the disc. No central canal stenosis. Mild bony foraminal narrowing on the left. C4 through C7: Previous ACDF has a good appearance with apparent solid union and wide patency of the canal and foramina. C7-T1: Bilateral facet osteoarthritis with 1-2 mm of anterolisthesis. No compressive narrowing of the canal or foramina. IMPRESSION: No sign of  infection in the cervical region. Previous ACDF C4 through C7. Solid union with wide patency of the canal and foramina. C7-T1 facet arthritis with 1-2 mm of anterolisthesis. No compressive stenosis. Mild adjacent segment degenerative changes C3-4 without compressive stenosis. Chronic facet joint fusion on the right at C2-3. Electronically Signed   By: MNelson ChimesM.D.   On: 07/08/2022 14:29   CT HEAD WO CONTRAST (5MM)  Result Date: 07/08/2022 CLINICAL DATA:  Head injury. EXAM: CT HEAD WITHOUT CONTRAST TECHNIQUE: Contiguous axial images were obtained from the base of the skull through the vertex without intravenous contrast. RADIATION DOSE REDUCTION: This exam was performed according to the departmental dose-optimization program which includes automated exposure control, adjustment of the mA and/or kV according to patient size and/or use of iterative reconstruction technique. COMPARISON:  None Available. FINDINGS: Brain: No evidence of acute infarction, hemorrhage, hydrocephalus, extra-axial collection or mass lesion/mass effect. Vascular: No hyperdense vessel or unexpected calcification. Skull: Normal. Negative for fracture or focal lesion. Sinuses/Orbits: No acute finding. Other: None. IMPRESSION: No acute intracranial abnormality seen. Electronically Signed   By: JMarijo ConceptionM.D.   On: 07/08/2022 11:15   DG Chest Port 1 View  Result Date: 07/08/2022 CLINICAL DATA:  Question sepsis EXAM: PORTABLE CHEST 1 VIEW COMPARISON:  02/23/2022 FINDINGS: The heart size and mediastinal contours are within normal limits. Both lungs are clear. The visualized skeletal structures are unremarkable. IMPRESSION: No active disease. Electronically Signed   By: MNelson ChimesM.D.   On: 07/08/2022 09:56    Microbiology: Recent Results (from the past 240 hour(s))  Resp Panel by RT-PCR (Flu A&B, Covid) Anterior Nasal Swab     Status: None   Collection Time: 07/08/22  9:15 AM   Specimen: Anterior Nasal Swab  Result Value  Ref Range Status   SARS Coronavirus 2 by RT PCR NEGATIVE NEGATIVE Final    Comment: (NOTE) SARS-CoV-2 target nucleic acids are NOT DETECTED.  The SARS-CoV-2 RNA is generally detectable in upper respiratory specimens during the acute phase of infection. The lowest concentration of SARS-CoV-2 viral copies this assay can detect is 138 copies/mL. A negative result does not preclude SARS-Cov-2 infection and should not be used as the sole basis for treatment or other patient management decisions. A negative result may occur with  improper specimen collection/handling, submission of specimen other than nasopharyngeal swab, presence of viral mutation(s) within the areas targeted by this  assay, and inadequate number of viral copies(<138 copies/mL). A negative result must be combined with clinical observations, patient history, and epidemiological information. The expected result is Negative.  Fact Sheet for Patients:  EntrepreneurPulse.com.au  Fact Sheet for Healthcare Providers:  IncredibleEmployment.be  This test is no t yet approved or cleared by the [place] FDA and  has been authorized for detection and/or diagnosis of SARS-CoV-2 by FDA under an Emergency Use Authorization (EUA). This EUA will remain  in effect (meaning this test can be used) for the duration of the COVID-19 declaration under Section 564(b)(1) of the Act, 21 U.S.C.section 360bbb-3(b)(1), unless the authorization is terminated  or revoked sooner.       Influenza A by PCR NEGATIVE NEGATIVE Final   Influenza B by PCR NEGATIVE NEGATIVE Final    Comment: (NOTE) The Xpert Xpress SARS-CoV-2/FLU/RSV plus assay is intended as an aid in the diagnosis of influenza from Nasopharyngeal swab specimens and should not be used as a sole basis for treatment. Nasal washings and aspirates are unacceptable for Xpert Xpress SARS-CoV-2/FLU/RSV testing.  Fact Sheet for  Patients: EntrepreneurPulse.com.au  Fact Sheet for Healthcare Providers: IncredibleEmployment.be  This test is not yet approved or cleared by the [place] FDA and has been authorized for detection and/or diagnosis of SARS-CoV-2 by FDA under an Emergency Use Authorization (EUA). This EUA will remain in effect (meaning this test can be used) for the duration of the COVID-19 declaration under Section 564(b)(1) of the Act, 21 U.S.C. section 360bbb-3(b)(1), unless the authorization is terminated or revoked.  Performed at [facility], [address] [place], [place] [address]   Blood Culture (routine x 2)     Status: Abnormal   Collection Time: 07/08/22  9:15 AM   Specimen: BLOOD  Result Value Ref Range Status   Specimen Description   Final    BLOOD BLOOD LEFT WRIST Performed at [facility], [address] [place] [address], [place], [place] [address]    Special Requests   Final    BOTTLES DRAWN AEROBIC AND ANAEROBIC Blood Culture adequate volume Performed at [facility], [address], [place], [place] [address]    Culture  Setup Time   Final    GRAM NEGATIVE RODS IN BOTH AEROBIC AND ANAEROBIC BOTTLES CRITICAL RESULT CALLED TO, READ BACK BY AND VERIFIED WITH: JASON ROBBINS 07/09/22 0703 MW    Culture (A)  Final    PANTOEA SPECIES SUSCEPTIBILITIES PERFORMED ON PREVIOUS CULTURE WITHIN THE LAST 5 DAYS. Performed at [facility], [place] [address] [place], [place] [address]    Report Status 07/11/2022 FINAL  Final  Blood Culture (routine x 2)     Status: Abnormal   Collection Time: 07/08/22  9:15 AM   Specimen: BLOOD  Result Value Ref Range Status   Specimen Description   Final    BLOOD BLOOD RIGHT FOREARM Performed at [facility], [address] [place], [place] [address]    Special Requests   Final    BOTTLES DRAWN AEROBIC AND ANAEROBIC Blood Culture results may not be optimal due to an excessive volume  of blood received in culture bottles Performed at [facility], [address] [place], [place] [address]    Culture  Setup Time   Final    GRAM NEGATIVE RODS IN BOTH AEROBIC AND ANAEROBIC BOTTLES CRITICAL RESULT CALLED TO, READ BACK BY AND VERIFIED WITH: Violeta Gelinas 07/09/22 0703 MW Performed at [facility], [address] [place], [place] [address]  Culture PANTOEA SPECIES (A)  Final   Report Status 07/11/2022 FINAL  Final   Organism ID, Bacteria PANTOEA SPECIES  Final      Susceptibility   Pantoea species - MIC*    CEFAZOLIN >=64 RESISTANT Resistant     CEFEPIME <=0.12 SENSITIVE Sensitive     CEFTAZIDIME <=1 SENSITIVE Sensitive     CEFTRIAXONE <=0.25 SENSITIVE Sensitive     CIPROFLOXACIN <=0.25 SENSITIVE Sensitive     GENTAMICIN <=1 SENSITIVE Sensitive     IMIPENEM <=0.25 SENSITIVE Sensitive     TRIMETH/SULFA <=20 SENSITIVE Sensitive     PIP/TAZO <=4 SENSITIVE Sensitive     * PANTOEA SPECIES  Blood Culture ID Panel (Reflexed)     Status: Abnormal   Collection Time: 07/08/22  9:15 AM  Result Value Ref Range Status   Enterococcus faecalis NOT DETECTED NOT DETECTED Final   Enterococcus Faecium NOT DETECTED NOT DETECTED Final   Listeria monocytogenes NOT DETECTED NOT DETECTED Final   Staphylococcus species NOT DETECTED NOT DETECTED Final   Staphylococcus aureus (BCID) NOT DETECTED NOT DETECTED Final   Staphylococcus epidermidis NOT DETECTED NOT DETECTED Final   Staphylococcus lugdunensis NOT DETECTED NOT DETECTED Final   Streptococcus species NOT DETECTED NOT DETECTED Final   Streptococcus agalactiae NOT DETECTED NOT DETECTED Final   Streptococcus pneumoniae NOT DETECTED NOT DETECTED Final   Streptococcus pyogenes NOT DETECTED NOT DETECTED Final   A.calcoaceticus-baumannii NOT DETECTED NOT DETECTED Final   Bacteroides fragilis NOT DETECTED NOT DETECTED Final   Enterobacterales DETECTED (A) NOT DETECTED Final    Comment: Enterobacterales represent a  large order of gram negative bacteria, not a single organism. Refer to culture for further identification. CRITICAL RESULT CALLED TO, READ BACK BY AND VERIFIED WITH: JASON ROBBINS 07/09/22 0703 MW    Enterobacter cloacae complex NOT DETECTED NOT DETECTED Final   Escherichia coli NOT DETECTED NOT DETECTED Final   Klebsiella aerogenes NOT DETECTED NOT DETECTED Final   Klebsiella oxytoca NOT DETECTED NOT DETECTED Final   Klebsiella pneumoniae NOT DETECTED NOT DETECTED Final   Proteus species NOT DETECTED NOT DETECTED Final   Salmonella species NOT DETECTED NOT DETECTED Final   Serratia marcescens NOT DETECTED NOT DETECTED Final   Haemophilus influenzae NOT DETECTED NOT DETECTED Final   Neisseria meningitidis NOT DETECTED NOT DETECTED Final   Pseudomonas aeruginosa NOT DETECTED NOT DETECTED Final   Stenotrophomonas maltophilia NOT DETECTED NOT DETECTED Final   Candida albicans NOT DETECTED NOT DETECTED Final   Candida auris NOT DETECTED NOT DETECTED Final   Candida glabrata NOT DETECTED NOT DETECTED Final   Candida krusei NOT DETECTED NOT DETECTED Final   Candida parapsilosis NOT DETECTED NOT DETECTED Final   Candida tropicalis NOT DETECTED NOT DETECTED Final   Cryptococcus neoformans/gattii NOT DETECTED NOT DETECTED Final   CTX-M ESBL NOT DETECTED NOT DETECTED Final   Carbapenem resistance IMP NOT DETECTED NOT DETECTED Final   Carbapenem resistance KPC NOT DETECTED NOT DETECTED Final   Carbapenem resistance NDM NOT DETECTED NOT DETECTED Final   Carbapenem resist OXA 48 LIKE NOT DETECTED NOT DETECTED Final   Carbapenem resistance VIM NOT DETECTED NOT DETECTED Final    Comment: Performed at [facility], [address], [place], [place] [address]  Urine Culture     Status: None   Collection Time: 07/08/22  9:20 AM   Specimen: In/Out Cath Urine  Result Value Ref Range Status   Specimen Description   Final    IN/OUT CATH URINE Performed at [facility],  [place], [place] [address]    Special Requests   Final    NONE Performed at [facility], [address] [place], [place] [address]    Culture   Final    NO GROWTH Performed at [facility], [place] [address] [place], [place] [address]    Report Status 07/09/2022 FINAL  Final  Respiratory (~20 pathogens) panel by PCR     Status: None   Collection Time: 07/08/22  1:00 PM   Specimen: Nasopharyngeal Swab; Respiratory  Result Value Ref Range Status   Adenovirus NOT DETECTED NOT DETECTED Final   Coronavirus 229E NOT DETECTED NOT DETECTED Final    Comment: (NOTE) The Coronavirus on the Respiratory Panel, DOES NOT test for the novel  Coronavirus (2019 nCoV)    Coronavirus HKU1 NOT DETECTED NOT DETECTED Final   Coronavirus NL63 NOT DETECTED NOT DETECTED Final   Coronavirus OC43 NOT DETECTED NOT DETECTED Final   Metapneumovirus NOT DETECTED NOT DETECTED Final   Rhinovirus / Enterovirus NOT DETECTED NOT DETECTED Final   Influenza A NOT DETECTED NOT DETECTED Final   Influenza B NOT DETECTED NOT DETECTED Final   Parainfluenza Virus 1 NOT DETECTED NOT DETECTED Final   Parainfluenza Virus 2 NOT DETECTED NOT DETECTED Final   Parainfluenza Virus 3 NOT DETECTED NOT DETECTED Final   Parainfluenza Virus 4 NOT DETECTED NOT DETECTED Final   Respiratory Syncytial Virus NOT DETECTED NOT DETECTED Final   Bordetella pertussis NOT DETECTED NOT DETECTED Final   Bordetella Parapertussis NOT DETECTED NOT DETECTED Final   Chlamydophila pneumoniae NOT DETECTED NOT DETECTED Final   Mycoplasma pneumoniae NOT DETECTED NOT DETECTED Final    Comment: Performed at [facility], [place] [address] [place], [place] [address]  Culture, blood (Routine X 2) w Reflex to ID Panel     Status: None (Preliminary result)   Collection Time: 07/10/22  4:50 AM   Specimen: BLOOD  Result Value Ref Range Status   Specimen Description BLOOD RIGHT ARM  Final   Special Requests   Final    BOTTLES DRAWN  AEROBIC AND ANAEROBIC Blood Culture adequate volume   Culture   Final    NO GROWTH 2 DAYS Performed at [facility], [address] [place], [place] [address]    Report Status PENDING  Incomplete  Culture, blood (Routine X 2) w Reflex to ID Panel     Status: None (Preliminary result)   Collection Time: 07/10/22  4:50 AM   Specimen: BLOOD  Result Value Ref Range Status   Specimen Description BLOOD RIGHT HAND  Final   Special Requests   Final    BOTTLES DRAWN AEROBIC AND ANAEROBIC Blood Culture adequate volume   Culture   Final    NO GROWTH 2 DAYS Performed at [facility], [address], [place],  [address]    Report Status PENDING  Incomplete     Labs: Basic Metabolic Panel: Recent Labs  Lab 07/08/22 0915 07/09/22 0436  NA 140 143  K 3.7 3.9  CL 109 111  CO2 22 26  GLUCOSE 99 93  BUN 19 14  CREATININE 1.30* 0.96  CALCIUM 9.0 8.7*   Liver Function Tests: Recent Labs  Lab 07/08/22 0915 07/09/22 0436  AST 63* 36  ALT 32 24  ALKPHOS 172* 94  BILITOT 0.6 0.7  PROT 6.4* 5.8*  ALBUMIN 3.2* 2.7*   No results for input(s): "LIPASE", "AMYLASE" in the last 168 hours. No  results for input(s): "AMMONIA" in the last 168 hours. CBC: Recent Labs  Lab 07/08/22 0915  WBC 5.3  NEUTROABS 4.4  HGB 11.0*  HCT 33.7*  MCV 89.2  PLT 134*   Cardiac Enzymes: No results for input(s): "CKTOTAL", "CKMB", "CKMBINDEX", "TROPONINI" in the last 168 hours. BNP: BNP (last 3 results) No results for input(s): "BNP" in the last 8760 hours.  ProBNP (last 3 results) No results for input(s): "PROBNP" in the last 8760 hours.  CBG: Recent Labs  Lab 07/09/22 1243 07/09/22 2032 07/10/22 2106 07/11/22 0808 07/11/22 1203  GLUCAP 99 112* 91 94 111*       Signed:  Desma Maxim MD.  [facility] 07/13/2022, 2:31 PM

## 2022-07-13 NOTE — Progress Notes (Signed)
ID Pt doing well No pain, no fever Tee done this morning neg for vegetations  O/e awake and alert Patient Vitals for the past 24 hrs:  BP Temp Temp src Pulse Resp SpO2  07/13/22 0859 (!) 109/57 97.7 F (36.5 C) -- 62 16 96 %  07/13/22 0839 108/64 -- -- 62 12 95 %  07/13/22 0830 96/61 -- -- 64 14 97 %  07/13/22 0815 100/65 -- -- 64 11 97 %  07/13/22 0806 109/62 -- -- 65 13 100 %  07/13/22 0757 115/65 -- -- -- 16 100 %  07/13/22 0428 111/65 97.8 F (36.6 C) Oral 71 18 95 %  07/12/22 2112 110/69 98.7 F (37.1 C) -- 73 16 96 %  07/12/22 1729 127/71 98.2 F (36.8 C) Oral 70 16 --   Chest b/l air entry [id number] Abd soft CNS non focal Excoriations over extremities , face healing   Labs    Latest Ref Rng & Units 07/08/2022    9:15 AM 02/23/2022    3:53 PM 10/30/2021    5:27 PM  CBC  WBC 4.0 - 10.5 K/uL 5.3  9.3  6.3   Hemoglobin 12.0 - 15.0 g/dL 11.0  14.9  12.1   Hematocrit 36.0 - 46.0 % 33.7  46.2  36.9   Platelets 150 - 400 K/uL 134  320  215        Latest Ref Rng & Units 07/13/2022    2:23 PM 07/09/2022    4:36 AM 07/08/2022    9:15 AM  CMP  Glucose 70 - 99 mg/dL 86  93  99   BUN 6 - 20 mg/dL '13  14  19   ' Creatinine 0.44 - 1.00 mg/dL 0.78  0.96  1.30   Sodium 135 - 145 mmol/L 139  143  140   Potassium 3.5 - 5.1 mmol/L 4.9  3.9  3.7   Chloride 98 - 111 mmol/L 103  111  109   CO2 22 - 32 mmol/L '25  26  22   ' Calcium 8.9 - 10.3 mg/dL 9.3  8.7  9.0   Total Protein 6.5 - 8.1 g/dL 7.1  5.8  6.4   Total Bilirubin 0.3 - 1.2 mg/dL 0.5  0.7  0.6   Alkaline Phos 38 - 126 U/L 78  94  172   AST 15 - 41 U/L 40  36  63   ALT 0 - 44 U/L 23  24  32      ESR 60 CRP 1.8  Micro Microbiology: 07/08/22- pantoea bacteremia 07/10/22 NG   Imaging CT Abdomen- N with no intraabdominal abscess  MRI thoracic, lumbar and cervical spine- no infection   Assessment /plan  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

## 2022-07-13 NOTE — Plan of Care (Signed)
  Problem: Education: Goal: Knowledge of General Education information will improve Description: Including pain rating scale, medication(s)/side effects and non-pharmacologic comfort measures 07/13/2022 0438 by Abigail Butts, RN Outcome: Progressing 07/12/2022 2301 by Abigail Butts, RN Outcome: Progressing   Problem: Health Behavior/Discharge Planning: Goal: Ability to manage health-related needs will improve 07/13/2022 0438 by Abigail Butts, RN Outcome: Progressing 07/12/2022 2301 by Abigail Butts, RN Outcome: Progressing   Problem: Clinical Measurements: Goal: Ability to maintain clinical measurements within normal limits will improve 07/13/2022 0438 by Abigail Butts, RN Outcome: Progressing 07/12/2022 2301 by Abigail Butts, RN Outcome: Progressing Goal: Will remain free from infection 07/13/2022 0438 by Abigail Butts, RN Outcome: Progressing 07/12/2022 2301 by Abigail Butts, RN Outcome: Progressing Goal: Diagnostic test results will improve 07/13/2022 0438 by Abigail Butts, RN Outcome: Progressing 07/12/2022 2301 by Abigail Butts, RN Outcome: Progressing Goal: Respiratory complications will improve 07/13/2022 0438 by Abigail Butts, RN Outcome: Progressing 07/12/2022 2301 by Abigail Butts, RN Outcome: Progressing Goal: Cardiovascular complication will be avoided 07/13/2022 0438 by Abigail Butts, RN Outcome: Progressing 07/12/2022 2301 by Abigail Butts, RN Outcome: Progressing   Problem: Activity: Goal: Risk for activity intolerance will decrease 07/13/2022 0438 by Abigail Butts, RN Outcome: Progressing 07/12/2022 2301 by Abigail Butts, RN Outcome: Progressing   Problem: Nutrition: Goal: Adequate nutrition will be maintained 07/13/2022 0438 by Abigail Butts, RN Outcome: Progressing 07/12/2022 2301 by Abigail Butts, RN Outcome: Progressing   Problem: Coping: Goal: Level of anxiety will decrease 07/13/2022 0438 by Abigail Butts, RN Outcome: Progressing 07/12/2022 2301 by Abigail Butts, RN Outcome: Progressing   Problem: Elimination: Goal: Will not experience complications related to bowel motility 07/13/2022 0438 by Abigail Butts, RN Outcome: Progressing 07/12/2022 2301 by Abigail Butts, RN Outcome: Progressing Goal: Will not experience complications related to urinary retention 07/13/2022 0438 by Abigail Butts, RN Outcome: Progressing 07/12/2022 2301 by Abigail Butts, RN Outcome: Progressing   Problem: Pain Managment: Goal: General experience of comfort will improve 07/13/2022 0438 by Abigail Butts, RN Outcome: Progressing 07/12/2022 2301 by Abigail Butts, RN Outcome: Progressing   Problem: Safety: Goal: Ability to remain free from injury will improve 07/13/2022 0438 by Abigail Butts, RN Outcome: Progressing 07/12/2022 2301 by Abigail Butts, RN Outcome: Progressing   Problem: Skin Integrity: Goal: Risk for impaired skin integrity will decrease 07/13/2022 0438 by Abigail Butts, RN Outcome: Progressing 07/12/2022 2301 by Abigail Butts, RN Outcome: Progressing   Problem: Fluid Volume: Goal: Hemodynamic stability will improve 07/13/2022 0438 by Abigail Butts, RN Outcome: Progressing 07/12/2022 2301 by Abigail Butts, RN Outcome: Progressing   Problem: Clinical Measurements: Goal: Diagnostic test results will improve 07/13/2022 0438 by Abigail Butts, RN Outcome: Progressing 07/12/2022 2301 by Abigail Butts, RN Outcome: Progressing Goal: Signs and symptoms of infection will decrease 07/13/2022 0438 by Abigail Butts, RN Outcome: Progressing 07/12/2022 2301 by Abigail Butts, RN Outcome: Progressing   Problem: Respiratory: Goal: Ability to maintain adequate ventilation will improve 07/13/2022 0438 by Abigail Butts, RN Outcome: Progressing 07/12/2022 2301 by Abigail Butts, RN Outcome: Progressing

## 2022-07-13 NOTE — Progress Notes (Signed)
Transesophageal Echocardiogram :  Indication: Bacteremia, rule out endocarditis Requesting/ordering  physician:   Procedure: Benzocaine spray x2 and 2 mls x 2 of viscous lidocaine were given orally to provide local anesthesia to the oropharynx. The patient was positioned supine on the left side, bite block provided. The patient was moderately sedated with the doses of versed and fentanyl as detailed below.  Using digital technique an omniplane probe was advanced into the distal esophagus without incident.   Moderate sedation: 1. Sedation used: Propofol per anesthesia  See report in EPIC  for complete details: In brief,  No valve vegetation noted transgastric imaging revealed normal LV function with no RWMAs and no mural apical thrombus. Estimated ejection fraction was 55%.  Right sided cardiac chambers were normal with no evidence of pulmonary hypertension.  Mild to moderate aortic valve regurgitation Trace PR Mild TR Mild MR  Imaging of the septum showed no ASD or VSD Bubble study was negative for shunt 2D and color flow confirmed no PFO  The LA was well visualized in orthogonal views.  There was no spontaneous contrast and no thrombus in the LA and LA appendage   The descending thoracic aorta had no  mural aortic debris with no evidence of aneurysmal dilation or disection . mild aortic atherosclerosis in the descending aorta, arch   Ida Rogue 07/13/2022 8:04 AM

## 2022-07-13 NOTE — Progress Notes (Signed)
*  PRELIMINARY RESULTS* Echocardiogram Echocardiogram Transesophageal has been performed.  [name] 07/13/2022, 8:09 AM

## 2022-07-13 NOTE — Transfer of Care (Signed)
Immediate Anesthesia Transfer of Care Note  Patient: [name]  Procedure(s) Performed: TRANSESOPHAGEAL ECHOCARDIOGRAM (TEE)  Patient Location: PACU and Nursing Unit  Anesthesia Type:General  Level of Consciousness: drowsy and patient cooperative  Airway & Oxygen Therapy: Patient Spontanous Breathing and Patient connected to nasal cannula oxygen  Post-op Assessment: Report given to RN and Post -op Vital signs reviewed and stable  Post vital signs: Reviewed and stable  Last Vitals:  Vitals Value Taken Time  BP 115/65 07/13/22 0757  Temp    Pulse    Resp 16 07/13/22 0757  SpO2 100 % 07/13/22 0757    Last Pain:  Vitals:   07/13/22 0428  TempSrc: Oral  PainSc:       Patients Stated Pain Goal: 2 (91/50/41 3643)  Complications: No notable events documented.

## 2022-07-13 NOTE — Care Management Important Message (Signed)
Important Message  Patient Details  Name: [name] MRN: [id number] Date of Birth: Oct 25, 1966   Medicare Important Message Given:  Yes     [name] A [name] 07/13/2022, 2:57 PM

## 2022-07-14 ENCOUNTER — Encounter: Payer: Self-pay | Admitting: Cardiovascular Disease

## 2022-07-14 LAB — DHEA-SULFATE: DHEA-SO4: 15 ug/dL — ABNORMAL LOW (ref 29.4–220.5)

## 2022-07-14 NOTE — Anesthesia Postprocedure Evaluation (Signed)
Anesthesia Post Note  Patient: [name]  Procedure(s) Performed: TRANSESOPHAGEAL ECHOCARDIOGRAM (TEE)     Patient location during evaluation: PACU Anesthesia Type: General Level of consciousness: awake and alert Pain management: pain level controlled Vital Signs Assessment: post-procedure vital signs reviewed and stable Respiratory status: spontaneous breathing, nonlabored ventilation, respiratory function stable and patient connected to nasal cannula oxygen Cardiovascular status: blood pressure returned to baseline and stable Postop Assessment: no apparent nausea or vomiting Anesthetic complications: no   No notable events documented.  Molli Barrows

## 2022-07-15 ENCOUNTER — Telehealth: Payer: Self-pay | Admitting: Obstetrics and Gynecology

## 2022-07-15 DIAGNOSIS — E2740 Unspecified adrenocortical insufficiency: Secondary | ICD-10-CM | POA: Insufficient documentation

## 2022-07-15 NOTE — Telephone Encounter (Signed)
Dheas low suggestive of adrenal insufficiency, called patient and informed, shared further w/u likely necessary, advised alerting pcp, will also place endo referral.

## 2022-07-18 LAB — CULTURE, BLOOD (ROUTINE X 2)
Culture: NO GROWTH
Culture: NO GROWTH
Special Requests: ADEQUATE
Special Requests: ADEQUATE

## 2022-07-21 ENCOUNTER — Inpatient Hospital Stay: Payer: Medicare Other | Admitting: Infectious Diseases

## 2022-07-28 ENCOUNTER — Inpatient Hospital Stay: Payer: Medicare Other | Admitting: Infectious Diseases

## 2022-07-30 ENCOUNTER — Other Ambulatory Visit
Admission: RE | Admit: 2022-07-30 | Discharge: 2022-07-30 | Disposition: A | Discharge status: Home / Self Care | Payer: Medicare Other | Attending: Infectious Diseases | Admitting: Infectious Diseases

## 2022-07-30 ENCOUNTER — Ambulatory Visit: Payer: Medicare Other | Attending: Infectious Diseases | Admitting: Infectious Diseases

## 2022-07-30 ENCOUNTER — Encounter: Payer: Self-pay | Admitting: Infectious Diseases

## 2022-07-30 VITALS — BP 143/84 | HR 84 | Temp 98.2°F | Ht 61.0 in | Wt 135.0 lb

## 2022-07-30 DIAGNOSIS — E079 Disorder of thyroid, unspecified: Secondary | ICD-10-CM | POA: Insufficient documentation

## 2022-07-30 DIAGNOSIS — R7881 Bacteremia: Secondary | ICD-10-CM | POA: Insufficient documentation

## 2022-07-30 DIAGNOSIS — F319 Bipolar disorder, unspecified: Secondary | ICD-10-CM | POA: Diagnosis not present

## 2022-07-30 DIAGNOSIS — R0602 Shortness of breath: Secondary | ICD-10-CM | POA: Diagnosis not present

## 2022-07-30 DIAGNOSIS — T148XXA Other injury of unspecified body region, initial encounter: Secondary | ICD-10-CM

## 2022-07-30 DIAGNOSIS — R509 Fever, unspecified: Secondary | ICD-10-CM | POA: Diagnosis not present

## 2022-07-30 DIAGNOSIS — F419 Anxiety disorder, unspecified: Secondary | ICD-10-CM | POA: Diagnosis not present

## 2022-07-30 LAB — CBC WITH DIFFERENTIAL/PLATELET
Abs Immature Granulocytes: 0.02 10*3/uL (ref 0.00–0.07)
Basophils Absolute: 0 10*3/uL (ref 0.0–0.1)
Basophils Relative: 0 %
Eosinophils Absolute: 0.3 10*3/uL (ref 0.0–0.5)
Eosinophils Relative: 4 %
HCT: 33.1 % — ABNORMAL LOW (ref 36.0–46.0)
Hemoglobin: 11 g/dL — ABNORMAL LOW (ref 12.0–15.0)
Immature Granulocytes: 0 %
Lymphocytes Relative: 40 %
Lymphs Abs: 2.7 10*3/uL (ref 0.7–4.0)
MCH: 29 pg (ref 26.0–34.0)
MCHC: 33.2 g/dL (ref 30.0–36.0)
MCV: 87.3 fL (ref 80.0–100.0)
Monocytes Absolute: 0.4 10*3/uL (ref 0.1–1.0)
Monocytes Relative: 6 %
Neutro Abs: 3.4 10*3/uL (ref 1.7–7.7)
Neutrophils Relative %: 50 %
Platelets: 212 10*3/uL (ref 150–400)
RBC: 3.79 MIL/uL — ABNORMAL LOW (ref 3.87–5.11)
RDW: 13.8 % (ref 11.5–15.5)
WBC: 6.9 10*3/uL (ref 4.0–10.5)
nRBC: 0 % (ref 0.0–0.2)

## 2022-07-30 LAB — COMPREHENSIVE METABOLIC PANEL
ALT: 17 U/L (ref 0–44)
AST: 34 U/L (ref 15–41)
Albumin: 4.1 g/dL (ref 3.5–5.0)
Alkaline Phosphatase: 69 U/L (ref 38–126)
Anion gap: 12 (ref 5–15)
BUN: 20 mg/dL (ref 6–20)
CO2: 24 mmol/L (ref 22–32)
Calcium: 9.7 mg/dL (ref 8.9–10.3)
Chloride: 103 mmol/L (ref 98–111)
Creatinine, Ser: 0.95 mg/dL (ref 0.44–1.00)
GFR, Estimated: >60 mL/min (ref >60)
Glucose, Bld: 128 mg/dL — ABNORMAL HIGH (ref 70–99)
Potassium: 4 mmol/L (ref 3.5–5.1)
Sodium: 139 mmol/L (ref 135–145)
Total Bilirubin: 0.6 mg/dL (ref 0.3–1.2)
Total Protein: 7.6 g/dL (ref 6.5–8.1)

## 2022-07-30 NOTE — Progress Notes (Signed)
NAME: [name]  DOB: [birth year]  MRN: [id number]  Date/Time: 07/30/2022 10:48 AM   Subjective:   ?follow up after recent hospitalization She was in [facility] between 07/08/22-07/13/22  [name] is a 56 y.o. with a history of Bipolar disorder, anxiety, depression, thyroid disease was hospitalized for fever and shortness of breath. She had these symptoms of fever , fatigue intermittently for months She also had some confusion. She was c/o low back pain Work up int he hospital revealed a positive blood culture for pantoea CT of the head is normal. CT of the chest showed enlargement of the pulmonary arterial trunk and right and left main pulmonary arteries suggesting pulmonary hypertension.  There was borderline mediastinal hilar lymph nodes.  There was bibasilar subpleural atelectasis. Urine toxicology was positive for opiate. EKG showed borderline prolonged QT interval and sinus tachycardia. The lumbar spine showed worsening of bilateral facet degeneration at L5-S1.  There was no sign of infection in the lumbar upper sacral region.  MRI of the thoracic spine was negative for any infection.  MRI of the cervical spine showed no sign of infection in the cervical region.  There was previous ACDF C4-C7 with solid union and wide patency of the canal and foramina. TEE was neg for endocarditis Serum cortisol levels at 5 am were low at 4.6 and 5.5  ACTH was okay at 28.9- cosyntropin test was inadequate   Latest Reference Range & Units 07/11/22 06:04  Cortisol, Base ug/dL 2.7  Cortisol, 30 Min ug/dL 14.1  Cortisol, 60 Min ug/dL 16.1   This was being managed by hospitalist who was investigating her labs  I had seen her for the bacteremia and she was on IV ceftriaxone for 5 days and on discharge switched to PO bactrim DS 1 tid- TEE and repeat blood culture were negative she took if for 2 days and then developed vomiting and stopped She has had no fever Her main concern is the itchy  spots on her face which she has had for a few years- had seen dermatologist at [facility] in 2002- she had asked for biopsy but they did not think it would be helpful- she would like to see a dermatologist and pursue biopsy She also clarified that she never did IV drugs as she felt there was a miscommunication with doctors when she was in the hospital about crushing pain pills and using it by IV.   Past Medical History:  Diagnosis Date   Abnormal uterine bleeding    ADHD (attention deficit hyperactivity disorder)    Allergy    Anxiety    Bipolar 1 disorder (HCC)    Bipolar disorder (HCC)    Cancer ([place])    uterine cancer   DDD (degenerative disc disease)    Depression    HSV infection    Hx: UTI (urinary tract infection)    Pertussis    Seizures ([place])    last seizure 12/2014    Thyroid disease     Past Surgical History:  Procedure Laterality Date   ABDOMINAL HYSTERECTOMY     CERVICAL SPINE SURGERY     x 3 2001,2005,2007 c5-6   CESAREAN SECTION  2003   EMBOLIZATION N/A 12/16/2017   Procedure: EMBOLIZATION;  Surgeon: Algernon Huxley, MD;  Location: [place] [facility];  Service: Cardiovascular;  Laterality: N/A;   ESOPHAGOGASTRODUODENOSCOPY (EGD) WITH PROPOFOL N/A 11/01/2019   Procedure: ESOPHAGOGASTRODUODENOSCOPY (EGD) WITH PROPOFOL;  Surgeon: Robert Bellow, MD;  Location: [facility];  Service: Endoscopy;  Laterality: N/A;  POSITIVE ON 09/29/19 and 10/06/19   HYSTEROSCOPY N/A 10/11/2017   Procedure: HYSTEROSCOPY;  Surgeon: Harlin Heys, MD;  Location: [facility];  Service: Gynecology;  Laterality: N/A;   HYSTEROSCOPY WITH D & C N/A 10/11/2017   Procedure: DILATATION AND CURETTAGE /HYSTEROSCOPY;  Surgeon: Harlin Heys, MD;  Location: [facility];  Service: Gynecology;  Laterality: N/A;   LAPAROSCOPIC BILATERAL SALPINGO OOPHERECTOMY Bilateral 12/15/2017   Procedure: LAPAROSCOPIC BILATERAL SALPINGO OOPHORECTOMY;  Surgeon: Mellody Drown, MD;  Location: [facility];  Service:  Gynecology;  Laterality: Bilateral;   LAPAROSCOPIC HYSTERECTOMY Bilateral 12/15/2017   Procedure: HYSTERECTOMY TOTAL LAPAROSCOPIC;  Surgeon: Mellody Drown, MD;  Location: [facility];  Service: Gynecology;  Laterality: Bilateral;   SENTINEL NODE BIOPSY N/A 12/15/2017   Procedure: SENTINEL NODE HGDJME,QASTMHD;  Surgeon: Mellody Drown, MD;  Location: [facility];  Service: Gynecology;  Laterality: N/A;   TEE WITHOUT CARDIOVERSION N/A 07/13/2022   Procedure: TRANSESOPHAGEAL ECHOCARDIOGRAM (TEE);  Surgeon: Minna Merritts, MD;  Location: [facility];  Service: Cardiovascular;  Laterality: N/A;   TONSILLECTOMY     TUBAL LIGATION      Social History   Socioeconomic History   Marital status: Married    Spouse name: Not on file   Number of children: 4   Years of education: Not on file   Highest education level: Not on file  Occupational History   Not on file  Tobacco Use   Smoking status: Never   Smokeless tobacco: Never  Vaping Use   Vaping Use: Some days  Substance and Sexual Activity   Alcohol use: Yes    Comment: occassionally    Drug use: Not Currently    Comment: last used in 1990's   Sexual activity: Yes    Birth control/protection: Surgical  Other Topics Concern   Not on file  Social History Narrative   Not on file   Social Determinants of Health   Financial Resource Strain: Not on file  Food Insecurity: Food Insecurity Present (07/08/2022)   Hunger Vital Sign    Worried About Running Out of Food in the Last Year: Never true    Ran Out of Food in the Last Year: Sometimes true  Transportation Needs: No Transportation Needs (07/08/2022)   PRAPARE - Hydrologist (Medical): No    Lack of Transportation (Non-Medical): No  Physical Activity: Not on file  Stress: Not on file  Social Connections: Not on file  Intimate Partner Violence: Not At Risk (07/08/2022)   Humiliation, Afraid, Rape, and Kick questionnaire    Fear of Current or Ex-Partner: No     Emotionally Abused: No    Physically Abused: No    Sexually Abused: No    Family History  Problem Relation Age of Onset   Cervical cancer Mother    Cervical cancer Maternal Grandmother    Polycythemia Maternal Grandmother    Stomach cancer Father    Colon cancer Paternal Grandmother    Cervical cancer Cousin    Cervical cancer Cousin    Diabetes Mellitus II Neg Hx    CAD Neg Hx    Colon polyps Neg Hx    Heart disease Neg Hx    Rectal cancer Neg Hx    Allergies  Allergen Reactions   Naproxen Anaphylaxis   Morphine And Related Itching   I? Current Outpatient Medications  Medication Sig Dispense Refill   acetaminophen (TYLENOL) 500 MG tablet Take 500-1,000 mg by mouth every 6 (six) hours  as needed for mild pain or headache.      amphetamine-dextroamphetamine (ADDERALL) 10 MG tablet Take 10 mg by mouth 2 (two) times daily.     cyclobenzaprine (FLEXERIL) 10 MG tablet Take 10 mg by mouth 3 (three) times daily as needed for muscle spasms.  2   DULoxetine (CYMBALTA) 30 MG capsule Take 30 mg by mouth 2 (two) times daily.     EPINEPHrine 0.3 mg/0.3 mL IJ SOAJ injection Inject 0.3 mg into the muscle once as needed (severe allergic reaction).     gabapentin (NEURONTIN) 300 MG capsule Take 300 mg by mouth 3 (three) times daily.     Oxycodone HCl 10 MG TABS Take 10 mg by mouth 4 (four) times daily as needed (moderate to severe pain).     No current facility-administered medications for this visit.     Abtx:  Anti-infectives (From admission, onward)    None       REVIEW OF SYSTEMS:  Const: negative fever, negative chills, negative weight loss Eyes: negative diplopia or visual changes, negative eye pain ENT: negative coryza, negative sore throat Resp: negative cough, hemoptysis, dyspnea Cards: negative for chest pain, palpitations, lower extremity edema GU: negative for frequency, dysuria and hematuria GI: Negative for abdominal pain, diarrhea, bleeding, constipation Skin: face  itchy spots and lesions  Heme: negative for easy bruising and gum/nose bleeding MS: doing well Neurolo:negative for headaches, dizziness, vertigo, memory problems  Psych:  anxiety, depression  Endocrine:  thyroid,  Allergy/Immunology-as above: Objective:  VITALS:  BP (!) 143/84   Pulse 84   Temp 98.2 F (36.8 C) (Temporal)   Ht '5\' 1"'$  (1.549 m)   Wt 135 lb (61.2 kg)   LMP 09/22/2017 (Exact Date)   BMI 25.51 kg/m   PHYSICAL EXAM:  General: Alert, cooperative, no distress, appears stated age.  Head: Normocephalic, without obvious abnormality, atraumatic. Eyes: Conjunctivae clear, anicteric sclerae. Pupils are equal ENT Nares normal. No drainage or sinus tenderness. Lips, mucosa, and tongue normal. No Thrush Neck: Supple, symmetrical, no adenopathy, thyroid: non tender no carotid bruit and no JVD. Back: No CVA tenderness. Lungs: Clear to auscultation bilaterally. No Wheezing or Rhonchi. No rales. Heart: Regular rate and rhythm, no murmur, rub or gallop. Abdomen: Soft, non-tender,not distended. Bowel sounds normal. No masses Extremities: atraumatic, no cyanosis. No edema. No clubbing Skin:over face- some excoriations, small lesions          Lymph: Cervical, supraclavicular normal. Neurologic: Grossly non-focal Pertinent Labs Lab Results CBC    Component Value Date/Time   WBC 5.3 07/08/2022 0915   RBC 3.78 (L) 07/08/2022 0915   HGB 11.0 (L) 07/08/2022 0915   HCT 33.7 (L) 07/08/2022 0915   PLT 134 (L) 07/08/2022 0915   MCV 89.2 07/08/2022 0915   MCH 29.1 07/08/2022 0915   MCHC 32.6 07/08/2022 0915   RDW 14.7 07/08/2022 0915   LYMPHSABS 0.7 07/08/2022 0915   MONOABS 0.0 (L) 07/08/2022 0915   EOSABS 0.1 07/08/2022 0915   BASOSABS 0.0 07/08/2022 0915       Latest Ref Rng & Units 07/13/2022    2:23 PM 07/09/2022    4:36 AM 07/08/2022    9:15 AM  CMP  Glucose 70 - 99 mg/dL 86  93  99   BUN 6 - 20 mg/dL '13  14  19   '$ Creatinine 0.44 - 1.00 mg/dL 0.78  0.96  1.30    Sodium 135 - 145 mmol/L 139  143  140   Potassium 3.5 -  5.1 mmol/L 4.9  3.9  3.7   Chloride 98 - 111 mmol/L 103  111  109   CO2 22 - 32 mmol/L '25  26  22   '$ Calcium 8.9 - 10.3 mg/dL 9.3  8.7  9.0   Total Protein 6.5 - 8.1 g/dL 7.1  5.8  6.4   Total Bilirubin 0.3 - 1.2 mg/dL 0.5  0.7  0.6   Alkaline Phos 38 - 126 U/L 78  94  172   AST 15 - 41 U/L 40  36  63   ALT 0 - 44 U/L 23  24  32    ? Impression/Recommendation Recent bacteremia with pantoea- a gram neg rod which is an environmental organism in water/soil She was treated with 5 days of iV and then sent home on Po bactrim for a week, but she took for 2 days only and stopped due to vomiting She has been  feeling well with no symptoms She had neg TEE, spine imaging and repeat blood culture when in the hospital Today will do labs- she will restart bactrim and if blood culture done today is neg she will stop it  Low cortisol - will have to make appt with endocrinologist and she will call them  Face leisons- will refer to Dermatologist Dr.Moye ? ?Discussed the management with patient Will let her know the labs  ? ___________________________________________________ P.S cbc/CMP okay Blood culture pending Note:  This document was prepared using Dragon voice recognition software and may include unintentional dictation errors.

## 2022-07-30 NOTE — Patient Instructions (Addendum)
You had a bacterial infection with a bacteria called pantoea . You were sent home on bactrim but you did not take it after a day as you had vomiting and thought you had a stomach bug Will do labs today- and restart Bactrim DS 1 pill twice a day I am referring you to [facility] Dermatologist for the skin lesions on your face [place]   [place] [id number]

## 2022-08-04 LAB — CULTURE, BLOOD (ROUTINE X 2): Culture: NO GROWTH

## 2022-08-11 ENCOUNTER — Encounter: Payer: Self-pay | Admitting: Emergency Medicine

## 2022-08-11 ENCOUNTER — Emergency Department: Payer: Medicare Other

## 2022-08-11 ENCOUNTER — Other Ambulatory Visit: Payer: Self-pay

## 2022-08-11 ENCOUNTER — Inpatient Hospital Stay
Admission: EM | Admit: 2022-08-11 | Discharge: 2022-08-19 | DRG: 872 | Disposition: A | Discharge status: Home / Self Care | Payer: Medicare Other | Attending: Internal Medicine | Admitting: Internal Medicine

## 2022-08-11 DIAGNOSIS — K219 Gastro-esophageal reflux disease without esophagitis: Secondary | ICD-10-CM | POA: Diagnosis present

## 2022-08-11 DIAGNOSIS — A419 Sepsis, unspecified organism: Secondary | ICD-10-CM | POA: Diagnosis not present

## 2022-08-11 DIAGNOSIS — G40909 Epilepsy, unspecified, not intractable, without status epilepticus: Secondary | ICD-10-CM | POA: Diagnosis present

## 2022-08-11 DIAGNOSIS — G8929 Other chronic pain: Secondary | ICD-10-CM | POA: Diagnosis present

## 2022-08-11 DIAGNOSIS — R569 Unspecified convulsions: Secondary | ICD-10-CM

## 2022-08-11 DIAGNOSIS — Z886 Allergy status to analgesic agent status: Secondary | ICD-10-CM

## 2022-08-11 DIAGNOSIS — F17290 Nicotine dependence, other tobacco product, uncomplicated: Secondary | ICD-10-CM | POA: Diagnosis present

## 2022-08-11 DIAGNOSIS — F909 Attention-deficit hyperactivity disorder, unspecified type: Secondary | ICD-10-CM | POA: Diagnosis present

## 2022-08-11 DIAGNOSIS — I959 Hypotension, unspecified: Secondary | ICD-10-CM | POA: Diagnosis present

## 2022-08-11 DIAGNOSIS — Z885 Allergy status to narcotic agent status: Secondary | ICD-10-CM

## 2022-08-11 DIAGNOSIS — R7881 Bacteremia: Secondary | ICD-10-CM

## 2022-08-11 DIAGNOSIS — F319 Bipolar disorder, unspecified: Secondary | ICD-10-CM | POA: Diagnosis present

## 2022-08-11 DIAGNOSIS — R6520 Severe sepsis without septic shock: Secondary | ICD-10-CM | POA: Diagnosis present

## 2022-08-11 DIAGNOSIS — R55 Syncope and collapse: Secondary | ICD-10-CM | POA: Diagnosis not present

## 2022-08-11 DIAGNOSIS — B9689 Other specified bacterial agents as the cause of diseases classified elsewhere: Secondary | ICD-10-CM | POA: Diagnosis present

## 2022-08-11 DIAGNOSIS — Z1152 Encounter for screening for COVID-19: Secondary | ICD-10-CM

## 2022-08-11 DIAGNOSIS — F1020 Alcohol dependence, uncomplicated: Secondary | ICD-10-CM | POA: Diagnosis present

## 2022-08-11 DIAGNOSIS — Z8542 Personal history of malignant neoplasm of other parts of uterus: Secondary | ICD-10-CM

## 2022-08-11 DIAGNOSIS — D649 Anemia, unspecified: Secondary | ICD-10-CM | POA: Diagnosis present

## 2022-08-11 DIAGNOSIS — N179 Acute kidney failure, unspecified: Secondary | ICD-10-CM | POA: Diagnosis present

## 2022-08-11 LAB — CBC
HCT: 34.7 % — ABNORMAL LOW (ref 36.0–46.0)
Hemoglobin: 11.3 g/dL — ABNORMAL LOW (ref 12.0–15.0)
MCH: 28.7 pg (ref 26.0–34.0)
MCHC: 32.6 g/dL (ref 30.0–36.0)
MCV: 88.1 fL (ref 80.0–100.0)
Platelets: 331 10*3/uL (ref 150–400)
RBC: 3.94 MIL/uL (ref 3.87–5.11)
RDW: 14.6 % (ref 11.5–15.5)
WBC: 12.2 10*3/uL — ABNORMAL HIGH (ref 4.0–10.5)
nRBC: 0 % (ref 0.0–0.2)

## 2022-08-11 LAB — BASIC METABOLIC PANEL
Anion gap: 9 (ref 5–15)
BUN: 16 mg/dL (ref 6–20)
CO2: 18 mmol/L — ABNORMAL LOW (ref 22–32)
Calcium: 8.9 mg/dL (ref 8.9–10.3)
Chloride: 109 mmol/L (ref 98–111)
Creatinine, Ser: 1 mg/dL (ref 0.44–1.00)
GFR, Estimated: >60 mL/min (ref >60)
Glucose, Bld: 96 mg/dL (ref 70–99)
Potassium: 3.9 mmol/L (ref 3.5–5.1)
Sodium: 136 mmol/L (ref 135–145)

## 2022-08-11 LAB — URINALYSIS, ROUTINE W REFLEX MICROSCOPIC
Bacteria, UA: NONE SEEN
Bilirubin Urine: NEGATIVE
Glucose, UA: NEGATIVE mg/dL
Hgb urine dipstick: NEGATIVE
Ketones, ur: NEGATIVE mg/dL
Leukocytes,Ua: NEGATIVE
Nitrite: NEGATIVE
Protein, ur: 100 mg/dL — AB
Specific Gravity, Urine: 1.019 (ref 1.005–1.030)
pH: 5 (ref 5.0–8.0)

## 2022-08-11 LAB — RESP PANEL BY RT-PCR (FLU A&B, COVID) ARPGX2
Influenza A by PCR: NEGATIVE
Influenza B by PCR: NEGATIVE
SARS Coronavirus 2 by RT PCR: NEGATIVE

## 2022-08-11 LAB — TROPONIN I (HIGH SENSITIVITY): Troponin I (High Sensitivity): 6 ng/L (ref <18)

## 2022-08-11 LAB — LACTIC ACID, PLASMA: Lactic Acid, Venous: 2.6 mmol/L (ref 0.5–1.9)

## 2022-08-11 MED ORDER — SODIUM CHLORIDE 0.9 % IV BOLUS
1000.0000 mL | Freq: Once | INTRAVENOUS | Status: AC
  Administered 2022-08-11: 1000 mL via INTRAVENOUS

## 2022-08-11 MED ORDER — VANCOMYCIN HCL 1250 MG/250ML IV SOLN
1250.0000 mg | Freq: Once | INTRAVENOUS | Status: AC
  Administered 2022-08-11: 1250 mg via INTRAVENOUS
  Filled 2022-08-11: qty 250

## 2022-08-11 MED ORDER — IOHEXOL 350 MG/ML SOLN
75.0000 mL | Freq: Once | INTRAVENOUS | Status: AC | PRN
  Administered 2022-08-11: 75 mL via INTRAVENOUS

## 2022-08-11 MED ORDER — SODIUM CHLORIDE 0.9 % IV SOLN
2.0000 g | Freq: Once | INTRAVENOUS | Status: AC
  Administered 2022-08-11: 2 g via INTRAVENOUS
  Filled 2022-08-11: qty 12.5

## 2022-08-11 NOTE — ED Provider Notes (Signed)
-----------------------------------------   11:10 PM on 08/11/2022 -----------------------------------------  Blood pressure (!) 97/52, pulse 84, temperature 99.4 F (37.4 C), temperature source Oral, resp. rate 13, last menstrual period 09/22/2017, SpO2 99 %.  Assuming care from Dr. Jacelyn Grip.  In short, [name] is a 56 y.o. female with a chief complaint of Loss of Consciousness .  Refer to the original H&P for additional details.  The current plan of care is to follow-up sepsis workup and CTA chest, anticipate admission.  ----------------------------------------- 12:30 AM on 08/12/2022 ----------------------------------------- CTA chest is negative for acute process, case discussed with hospitalist for admission for concern for recurrent bacteremia, no clear source at this time.    Blake Divine, MD 08/12/22 [phone]

## 2022-08-11 NOTE — ED Provider Notes (Signed)
[facility] Provider Note    Event Date/Time   First MD Initiated Contact with Patient 08/11/22 2239     (approximate)   History   Loss of Consciousness   HPI  [name] is a 56 y.o. female   Past medical history of IVDU, anxiety, bipolar, depression, who presents to the emergency department with lightheadedness, tremors, chills, generalized weakness and fatigue, transient chest pressure and shortness of breath and a syncopal episode earlier today, atraumatic and caught by her family members.  She was recently discharged from the hospital within this past month for sepsis, endocarditis work-up was negative, and completed antibiotics.  She says that this feeling was similar to when she presented for sepsis last time.  He denies focal infectious symptoms like cough, fever, abdominal pain, nausea or vomiting, dysuria.  She states that she has not used IV drugs since her last discharge.  History was obtained via the patient and a review of external medical notes including discharge summary from last month.      Physical Exam   Triage Vital Signs: ED Triage Vitals  Enc Vitals Group     BP 08/11/22 1553 99/62     Pulse Rate 08/11/22 1553 (!) 108     Resp 08/11/22 1553 18     Temp 08/11/22 1553 99.6 F (37.6 C)     Temp Source 08/11/22 1553 Oral     SpO2 08/11/22 1553 96 %     Weight --      Height --      Head Circumference --      Peak Flow --      Pain Score 08/11/22 1559 8     Pain Loc --      Pain Edu? --      Excl. in [place]? --     Most recent vital signs: Vitals:   08/11/22 1553 08/11/22 2230  BP: 99/62 (!) 97/52  Pulse: (!) 108 84  Resp: 18 13  Temp: 99.6 F (37.6 C) 99.4 F (37.4 C)  SpO2: 96% 99%    General: Awake, no distress.  Alert and oriented CV:  Good peripheral perfusion.  No murmurs. Resp:  Normal effort.  Clear to auscultation Abd:  No distention.  Nontender Other:  Hypotensive 90s over 60s and tachycardic  in the low 100s, borderline temperature 99.6 and no hypoxia, appears nontoxic and comfortable.   ED Results / Procedures / Treatments   Labs (all labs ordered are listed, but only abnormal results are displayed) Labs Reviewed  BASIC METABOLIC PANEL - Abnormal; Notable for the following components:      Result Value   CO2 18 (*)    All other components within normal limits  CBC - Abnormal; Notable for the following components:   WBC 12.2 (*)    Hemoglobin 11.3 (*)    HCT 34.7 (*)    All other components within normal limits  URINALYSIS, ROUTINE W REFLEX MICROSCOPIC - Abnormal; Notable for the following components:   Color, Urine YELLOW (*)    APPearance CLOUDY (*)    Protein, ur 100 (*)    All other components within normal limits  LACTIC ACID, PLASMA - Abnormal; Notable for the following components:   Lactic Acid, Venous 2.6 (*)    All other components within normal limits  RESP PANEL BY RT-PCR (FLU A&B, COVID) ARPGX2  CULTURE, BLOOD (SINGLE)  CULTURE, BLOOD (ROUTINE X 2)  CULTURE, BLOOD (ROUTINE X 2)  URINE CULTURE  LACTIC ACID, PLASMA  POC URINE PREG, ED  CBG MONITORING, ED  TROPONIN I (HIGH SENSITIVITY)     I reviewed labs and they are notable for lactic acid of 2.6  EKG  ED ECG REPORT I, Lucillie Garfinkel, the attending physician, personally viewed and interpreted this ECG.   Date: 08/11/2022  EKG Time: 1622  Rate: 106  Rhythm: sinus tachycardia  Axis: nl  Intervals:no ischemic changes      RADIOLOGY I independently reviewed and interpreted chest x-ray and see no obvious focalities or pneumothorax   PROCEDURES:  Critical Care performed: Yes, see critical care procedure note(s)  .Critical Care  Performed by: Lucillie Garfinkel, MD Authorized by: Lucillie Garfinkel, MD   Critical care provider statement:    Critical care time (minutes):  30   Critical care was necessary to treat or prevent imminent or life-threatening deterioration of the following conditions:   Sepsis   Critical care was time spent personally by me on the following activities:  Development of treatment plan with patient or surrogate, discussions with consultants, evaluation of patient's response to treatment, examination of patient, ordering and review of laboratory studies, ordering and review of radiographic studies, ordering and performing treatments and interventions, pulse oximetry, re-evaluation of patient's condition and review of old charts    MEDICATIONS ORDERED IN ED: Medications  vancomycin (VANCOREADY) IVPB 1250 mg/250 mL (has no administration in time range)  sodium chloride 0.9 % bolus 1,000 mL (1,000 mLs Intravenous New Bag/Given 08/11/22 2243)  ceFEPIme (MAXIPIME) 2 g in sodium chloride 0.9 % 100 mL IVPB (0 g Intravenous Stopped 08/11/22 2316)  sodium chloride 0.9 % bolus 1,000 mL (1,000 mLs Intravenous New Bag/Given 08/11/22 2317)  iohexol (OMNIPAQUE) 350 MG/ML injection 75 mL (75 mLs Intravenous Contrast Given 08/11/22 2319)     IMPRESSION / MDM / [place] / ED COURSE  I reviewed the triage vital signs and the nursing notes.                              Differential diagnosis includes, but is not limited to, infection, sepsis, metabolic derangement, AKI or dehydration, cardiac dysrhythmia, PE   The patient is on the cardiac monitor to evaluate for evidence of arrhythmia and/or significant heart rate changes.  MDM: Patient with recent sepsis with symptoms similar to prior and a high lactic acid and hypotension, tachycardia, concern for sepsis so initiated sepsis protocol with IV crystalloid infusion 30 cc per ideal body weight and broad-spectrum antibiotics at this time.  Also with some chest pressure, will obtain CTA chest to rule out pulmonary embolism.  Will require admission.   Patient's presentation is most consistent with acute presentation with potential threat to life or bodily function.   Clinical Course as of 08/11/22 2330  Tue Aug 11, 2022  1734 Troponin I (High Sensitivity) [JM]    Clinical Course User Index [JM] Menshew, Dannielle Karvonen, PA-C     FINAL CLINICAL IMPRESSION(S) / ED DIAGNOSES   Final diagnoses:  Sepsis, due to unspecified organism, unspecified whether acute organ dysfunction present [facility])     Rx / DC Orders   ED Discharge Orders     None        Note:  This document was prepared using Dragon voice recognition software and may include unintentional dictation errors.    Lucillie Garfinkel, MD 08/11/22 2330

## 2022-08-11 NOTE — ED Triage Notes (Signed)
Patient to ED via POV for syncope, chest pressure, and tremors. Pt's visitor said she had episode of slurred speech. Patient states she had similar symptoms a couple of weeks ago with a blood infection.

## 2022-08-11 NOTE — ED Notes (Signed)
EDP notified about the patients BP - see orders for intervention. Pt advised that she NPO until otherwise informed by EDP - pt verbalized understanding.

## 2022-08-11 NOTE — ED Notes (Signed)
Pt to CT

## 2022-08-11 NOTE — ED Notes (Signed)
LA result taken from lab, MD Padchuowski aware of result.

## 2022-08-11 NOTE — Progress Notes (Signed)
PHARMACY -  BRIEF ANTIBIOTIC NOTE   Pharmacy has received consult(s) for Vancomycin from an ED provider.  The patient's profile has been reviewed for ht/wt/allergies/indication/available labs.    One time order(s) placed for Vancomycin 1250 mg per pt wt: 61.2 kg.  Further antibiotics/pharmacy consults should be ordered by admitting physician if indicated.                       Thank you, Renda Rolls, PharmD, [facility] 08/11/2022 10:56 PM

## 2022-08-11 NOTE — ED Provider Triage Note (Cosign Needed Addendum)
Emergency Medicine Provider Triage Evaluation Note  [name], a 56 y.o. female  was evaluated in triage.  Pt complains of neurolyse weakness with a syncopal episode.  Patient also reports some pressure in her chest as well as some tremors.  Patient has similar symptoms a couple weeks ago when she was being treated for sepsis.  Patient with a history of bulimia, GERD, seizure disorder (not on medications), is being followed by ID.   Review of Systems  Positive: Syncope, chest pressure Negative: FCS  Physical Exam  BP 99/62 (BP Location: Left Arm)   Pulse (!) 108   Temp 99.6 F (37.6 C) (Oral)   Resp 18   LMP 09/22/2017 (Exact Date)   SpO2 96%  Gen:   Awake, no distress  NAD Resp:  Normal effort CTA MSK:   Moves extremities without difficulty  CVS:  Tachy RRR  Medical Decision Making  Medically screening exam initiated at 4:04 PM.  Appropriate orders placed.  Terrance Mass was informed that the remainder of the evaluation will be completed by another provider, this initial triage assessment does not replace that evaluation, and the importance of remaining in the ED until their evaluation is complete.  Patient with a history of bacteremia presents to the ED for evaluation a syncopal episode and chest pressure.    Melvenia Needles, PA-C 08/11/22 1610

## 2022-08-12 ENCOUNTER — Observation Stay: Payer: Medicare Other

## 2022-08-12 ENCOUNTER — Emergency Department: Payer: Medicare Other

## 2022-08-12 DIAGNOSIS — R55 Syncope and collapse: Secondary | ICD-10-CM

## 2022-08-12 LAB — PROCALCITONIN: Procalcitonin: 15.48 ng/mL

## 2022-08-12 LAB — TYPE AND SCREEN
ABO/RH(D): A POS
Antibody Screen: NEGATIVE

## 2022-08-12 LAB — BLOOD CULTURE ID PANEL (REFLEXED) - BCID2

## 2022-08-12 LAB — HEPATIC FUNCTION PANEL
ALT: 15 U/L (ref 0–44)
AST: 26 U/L (ref 15–41)
Albumin: 3.1 g/dL — ABNORMAL LOW (ref 3.5–5.0)
Alkaline Phosphatase: 62 U/L (ref 38–126)
Bilirubin, Direct: 0.3 mg/dL — ABNORMAL HIGH (ref 0.0–0.2)
Indirect Bilirubin: 0.6 mg/dL (ref 0.3–0.9)
Total Bilirubin: 0.9 mg/dL (ref 0.3–1.2)
Total Protein: 6.4 g/dL — ABNORMAL LOW (ref 6.5–8.1)

## 2022-08-12 LAB — LACTIC ACID, PLASMA: Lactic Acid, Venous: 1.5 mmol/L (ref 0.5–1.9)

## 2022-08-12 LAB — CBG MONITORING, ED: Glucose-Capillary: 112 mg/dL — ABNORMAL HIGH (ref 70–99)

## 2022-08-12 LAB — CK: Total CK: 45 U/L (ref 38–234)

## 2022-08-12 LAB — ETHANOL: Alcohol, Ethyl (B): <10 mg/dL (ref <10)

## 2022-08-12 MED ORDER — ACETAMINOPHEN 500 MG PO TABS
500.0000 mg | ORAL_TABLET | Freq: Four times a day (QID) | ORAL | Status: DC | PRN
  Administered 2022-08-12 – 2022-08-15 (×6): 1000 mg via ORAL
  Administered 2022-08-16: 500 mg via ORAL
  Administered 2022-08-17 – 2022-08-18 (×3): 1000 mg via ORAL
  Filled 2022-08-12 (×3): qty 2
  Filled 2022-08-12: qty 1
  Filled 2022-08-12 (×6): qty 2

## 2022-08-12 MED ORDER — OXYCODONE HCL 5 MG PO TABS
5.0000 mg | ORAL_TABLET | Freq: Once | ORAL | Status: AC
  Administered 2022-08-12: 5 mg via ORAL
  Filled 2022-08-12: qty 1

## 2022-08-12 MED ORDER — LACTATED RINGERS IV SOLN: INTRAVENOUS | Status: DC

## 2022-08-12 MED ORDER — CYCLOBENZAPRINE HCL 10 MG PO TABS
10.0000 mg | ORAL_TABLET | Freq: Three times a day (TID) | ORAL | Status: DC | PRN
  Administered 2022-08-12 – 2022-08-16 (×3): 10 mg via ORAL
  Filled 2022-08-12 (×4): qty 1

## 2022-08-12 MED ORDER — HEPARIN SODIUM (PORCINE) 5000 UNIT/ML IJ SOLN
5000.0000 [IU] | Freq: Three times a day (TID) | INTRAMUSCULAR | Status: DC
  Administered 2022-08-12 – 2022-08-19 (×18): 5000 [IU] via SUBCUTANEOUS
  Filled 2022-08-12 (×20): qty 1

## 2022-08-12 MED ORDER — SODIUM CHLORIDE 0.9% FLUSH
3.0000 mL | Freq: Two times a day (BID) | INTRAVENOUS | Status: DC
  Administered 2022-08-12 – 2022-08-19 (×16): 3 mL via INTRAVENOUS

## 2022-08-12 MED ORDER — SODIUM CHLORIDE 0.9 % IV SOLN
2.0000 g | Freq: Two times a day (BID) | INTRAVENOUS | Status: DC
  Administered 2022-08-12 – 2022-08-13 (×3): 2 g via INTRAVENOUS
  Filled 2022-08-12: qty 12.5
  Filled 2022-08-12 (×2): qty 2

## 2022-08-12 MED ORDER — THIAMINE HCL 100 MG/ML IJ SOLN
100.0000 mg | Freq: Every day | INTRAMUSCULAR | Status: DC
  Administered 2022-08-12 – 2022-08-19 (×8): 100 mg via INTRAVENOUS
  Filled 2022-08-12 (×8): qty 2

## 2022-08-12 MED ORDER — POTASSIUM CHLORIDE CRYS ER 20 MEQ PO TBCR
40.0000 meq | EXTENDED_RELEASE_TABLET | Freq: Once | ORAL | Status: AC
  Administered 2022-08-12: 40 meq via ORAL
  Filled 2022-08-12: qty 2

## 2022-08-12 MED ORDER — SODIUM CHLORIDE 0.9 % IV SOLN: 2.0000 g | Freq: Three times a day (TID) | INTRAVENOUS | Status: DC

## 2022-08-12 MED ORDER — DULOXETINE HCL 30 MG PO CPEP
30.0000 mg | ORAL_CAPSULE | Freq: Two times a day (BID) | ORAL | Status: DC
  Administered 2022-08-12 – 2022-08-19 (×15): 30 mg via ORAL
  Filled 2022-08-12 (×16): qty 1

## 2022-08-12 MED ORDER — VANCOMYCIN HCL IN DEXTROSE 1-5 GM/200ML-% IV SOLN
1000.0000 mg | INTRAVENOUS | Status: DC
  Administered 2022-08-12: 1000 mg via INTRAVENOUS
  Filled 2022-08-12: qty 200

## 2022-08-12 MED ORDER — PANTOPRAZOLE SODIUM 40 MG IV SOLR
40.0000 mg | Freq: Two times a day (BID) | INTRAVENOUS | Status: DC
  Administered 2022-08-12 – 2022-08-19 (×16): 40 mg via INTRAVENOUS
  Filled 2022-08-12 (×16): qty 10

## 2022-08-12 MED ORDER — OXYCODONE HCL 5 MG PO TABS
10.0000 mg | ORAL_TABLET | Freq: Four times a day (QID) | ORAL | Status: DC | PRN
  Administered 2022-08-12 – 2022-08-17 (×17): 10 mg via ORAL
  Filled 2022-08-12 (×18): qty 2

## 2022-08-12 MED ORDER — GABAPENTIN 300 MG PO CAPS
300.0000 mg | ORAL_CAPSULE | Freq: Three times a day (TID) | ORAL | Status: DC
  Administered 2022-08-12 – 2022-08-19 (×23): 300 mg via ORAL
  Filled 2022-08-12 (×23): qty 1

## 2022-08-12 MED ORDER — AMPHETAMINE-DEXTROAMPHETAMINE 10 MG PO TABS
10.0000 mg | ORAL_TABLET | Freq: Two times a day (BID) | ORAL | Status: DC
  Administered 2022-08-12 – 2022-08-19 (×15): 10 mg via ORAL
  Filled 2022-08-12 (×15): qty 1

## 2022-08-12 NOTE — Progress Notes (Signed)
       CROSS COVER NOTE  NAME: EMILYANN BANKA MRN: [id number] DOB : 04-05-1966 ATTENDING PHYSICIAN: '@ATTENDING'$ @    Date of Service   08/12/2022   HPI/Events of Note   Medication request received to address chronic pain. PDMP checked.  Interventions   Assessment/Plan:  Oxycodone '5mg'$  x1     This document was prepared using Dragon voice recognition software and may include unintentional dictation errors.  Neomia Glass DNP, MBA, FNP-BC Nurse Practitioner [facility] Pager [phone]

## 2022-08-12 NOTE — Care Management Obs Status (Signed)
[place] NOTIFICATION   Patient Details  Name: [name] MRN: [id number] Date of Birth: May 07, 1966   Medicare Observation Status Notification Given:  Yes    Colen Darling, [place] 08/12/2022, 2:19 PM

## 2022-08-12 NOTE — Progress Notes (Signed)
PHARMACY - PHYSICIAN COMMUNICATION CRITICAL VALUE ALERT - BLOOD CULTURE IDENTIFICATION (BCID)  Results for orders placed or performed during the hospital encounter of 08/11/22  Resp Panel by RT-PCR (Flu A&B, Covid) Anterior Nasal Swab     Status: None   Collection Time: 08/11/22  4:01 PM   Specimen: Anterior Nasal Swab  Result Value Ref Range Status   SARS Coronavirus 2 by RT PCR NEGATIVE NEGATIVE Final    Comment: (NOTE) SARS-CoV-2 target nucleic acids are NOT DETECTED.  The SARS-CoV-2 RNA is generally detectable in upper respiratory specimens during the acute phase of infection. The lowest concentration of SARS-CoV-2 viral copies this assay can detect is 138 copies/mL. A negative result does not preclude SARS-Cov-2 infection and should not be used as the sole basis for treatment or other patient management decisions. A negative result may occur with  improper specimen collection/handling, submission of specimen other than nasopharyngeal swab, presence of viral mutation(s) within the areas targeted by this assay, and inadequate number of viral copies(<138 copies/mL). A negative result must be combined with clinical observations, patient history, and epidemiological information. The expected result is Negative.  Fact Sheet for Patients:  EntrepreneurPulse.com.au  Fact Sheet for Healthcare Providers:  IncredibleEmployment.be  This test is no t yet approved or cleared by the [place] FDA and  has been authorized for detection and/or diagnosis of SARS-CoV-2 by FDA under an Emergency Use Authorization (EUA). This EUA will remain  in effect (meaning this test can be used) for the duration of the COVID-19 declaration under Section 564(b)(1) of the Act, 21 U.S.C.section 360bbb-3(b)(1), unless the authorization is terminated  or revoked sooner.       Influenza A by PCR NEGATIVE NEGATIVE Final   Influenza B by PCR NEGATIVE NEGATIVE Final     Comment: (NOTE) The Xpert Xpress SARS-CoV-2/FLU/RSV plus assay is intended as an aid in the diagnosis of influenza from Nasopharyngeal swab specimens and should not be used as a sole basis for treatment. Nasal washings and aspirates are unacceptable for Xpert Xpress SARS-CoV-2/FLU/RSV testing.  Fact Sheet for Patients: EntrepreneurPulse.com.au  Fact Sheet for Healthcare Providers: IncredibleEmployment.be  This test is not yet approved or cleared by the [place] FDA and has been authorized for detection and/or diagnosis of SARS-CoV-2 by FDA under an Emergency Use Authorization (EUA). This EUA will remain in effect (meaning this test can be used) for the duration of the COVID-19 declaration under Section 564(b)(1) of the Act, 21 U.S.C. section 360bbb-3(b)(1), unless the authorization is terminated or revoked.  Performed at [facility], [address] [place], [place] [address]   Blood culture (single)     Status: None (Preliminary result)   Collection Time: 08/11/22  4:02 PM   Specimen: BLOOD  Result Value Ref Range Status   Specimen Description BLOOD BLOOD RIGHT HAND  Final   Special Requests   Final    BOTTLES DRAWN AEROBIC AND ANAEROBIC Blood Culture adequate volume   Culture   Final    NO GROWTH < 24 HOURS Performed at [facility], [address] [place], [place] [address]    Report Status PENDING  Incomplete  Blood Culture (routine x 2)     Status: None (Preliminary result)   Collection Time: 08/11/22 10:41 PM   Specimen: BLOOD  Result Value Ref Range Status   Specimen Description BLOOD LEFT ANTECUBITAL  Final   Special Requests   Final    BOTTLES DRAWN AEROBIC AND ANAEROBIC Blood Culture adequate volume  Culture  Setup Time   Final    Organism ID to follow GRAM NEGATIVE RODS ANAEROBIC BOTTLE ONLY CRITICAL RESULT CALLED TO, READ BACK BY AND VERIFIED WITH: Miel Wisener AT 2239 ON 08/12/22 BY  SS Performed at [facility], [address] [place], [place] [address]    Culture GRAM NEGATIVE RODS  Final   Report Status PENDING  Incomplete  Blood Culture ID Panel (Reflexed)     Status: Abnormal   Collection Time: 08/11/22 10:41 PM  Result Value Ref Range Status   Enterococcus faecalis NOT DETECTED NOT DETECTED Final   Enterococcus Faecium NOT DETECTED NOT DETECTED Final   Listeria monocytogenes NOT DETECTED NOT DETECTED Final   Staphylococcus species NOT DETECTED NOT DETECTED Final   Staphylococcus aureus (BCID) NOT DETECTED NOT DETECTED Final   Staphylococcus epidermidis NOT DETECTED NOT DETECTED Final   Staphylococcus lugdunensis NOT DETECTED NOT DETECTED Final   Streptococcus species NOT DETECTED NOT DETECTED Final   Streptococcus agalactiae NOT DETECTED NOT DETECTED Final   Streptococcus pneumoniae NOT DETECTED NOT DETECTED Final   Streptococcus pyogenes NOT DETECTED NOT DETECTED Final   A.calcoaceticus-baumannii NOT DETECTED NOT DETECTED Final   Bacteroides fragilis NOT DETECTED NOT DETECTED Final   Enterobacterales DETECTED (A) NOT DETECTED Final    Comment: Enterobacterales represent a large order of gram negative bacteria, not a single organism. Refer to culture for further identification. CRITICAL RESULT CALLED TO, READ BACK BY AND VERIFIED WITH: [name] AT 2239 ON 08/12/22 BY SS    Enterobacter cloacae complex NOT DETECTED NOT DETECTED Final   Escherichia coli NOT DETECTED NOT DETECTED Final   Klebsiella aerogenes NOT DETECTED NOT DETECTED Final   Klebsiella oxytoca NOT DETECTED NOT DETECTED Final   Klebsiella pneumoniae NOT DETECTED NOT DETECTED Final   Proteus species NOT DETECTED NOT DETECTED Final   Salmonella species NOT DETECTED NOT DETECTED Final   Serratia marcescens NOT DETECTED NOT DETECTED Final   Haemophilus influenzae NOT DETECTED NOT DETECTED Final   Neisseria meningitidis NOT DETECTED NOT DETECTED Final   Pseudomonas aeruginosa NOT  DETECTED NOT DETECTED Final   Stenotrophomonas maltophilia NOT DETECTED NOT DETECTED Final   Candida albicans NOT DETECTED NOT DETECTED Final   Candida auris NOT DETECTED NOT DETECTED Final   Candida glabrata NOT DETECTED NOT DETECTED Final   Candida krusei NOT DETECTED NOT DETECTED Final   Candida parapsilosis NOT DETECTED NOT DETECTED Final   Candida tropicalis NOT DETECTED NOT DETECTED Final   Cryptococcus neoformans/gattii NOT DETECTED NOT DETECTED Final   CTX-M ESBL NOT DETECTED NOT DETECTED Final   Carbapenem resistance IMP NOT DETECTED NOT DETECTED Final   Carbapenem resistance KPC NOT DETECTED NOT DETECTED Final   Carbapenem resistance NDM NOT DETECTED NOT DETECTED Final   Carbapenem resist OXA 48 LIKE NOT DETECTED NOT DETECTED Final   Carbapenem resistance VIM NOT DETECTED NOT DETECTED Final    Comment: Performed at [facility], [address] [place], [place] [address]   Initial BCID results:  1 (anaerobic) of 4 bottles w/ Enterobacterales.  Pt currently on Cefepime & Vancomycin.  ID consulted.  Name of provider contacted: Morton Amy, NP   Changes to prescribed antibiotics required: No changes at this time, pending F/U by ID.  Renda Rolls, PharmD, [facility] 08/12/2022 11:23 PM

## 2022-08-12 NOTE — ED Notes (Signed)
Pt requesting pain med for chronic pain, provider notified, awaiting orders

## 2022-08-12 NOTE — Progress Notes (Signed)
Brief hospitalist update note.  This is a nonbillable note.  Please see same-day H&P for full billable details.  Briefly, this is a 56 year old female who presents for evaluation after syncopal event.  Unclear etiology of syncope.  Orthostatics negative.  At this time unable to exclude infection.  Patient was previously on long-term IV antibiotics for bacteremia.  Source at that time was unclear.  Procalcitonin markedly elevated.  Urinalysis not indicative of infection, chest x-ray negative.  Blood cultures taken on admission and currently pending.  Given elevated procalcitonin and syncopal event we will choose to treat empirically with broad-spectrum IV antibiotics for the next 24 hours.  Monitor vitals and fever curve.  Recheck labs and procalcitonin in AM.  If no clear source of infection consider discharge on 10/19.  Ralene Muskrat MD  No charge

## 2022-08-12 NOTE — Assessment & Plan Note (Addendum)
Pt meets SIRS criteria do not suspect infection. Pt has received IV abx due to lactic acid level. We will continue and follow.  MIVF LR at 75 cc/ hour.

## 2022-08-12 NOTE — ED Notes (Signed)
Dr. Posey Pronto @ the bedside.

## 2022-08-12 NOTE — Assessment & Plan Note (Signed)
Patient is at risk for syncope as she is on Cymbalta in addition to flexeril putting her at risk for falls.  Fall precaution.

## 2022-08-12 NOTE — Assessment & Plan Note (Signed)
Lab Results  Component Value Date   CREATININE 1.00 08/11/2022   CREATININE 0.95 07/30/2022   CREATININE 0.78 07/13/2022  resolved.  We will follow and monitor.

## 2022-08-12 NOTE — Care Management CC44 (Signed)
Condition Code 44 Documentation Completed  Patient Details  Name: [name] MRN: [id number] Date of Birth: [birth year]   Condition Code 44 given:  Yes Patient signature on Condition Code 44 notice:  Yes Documentation of 2 MD's agreement:  Yes Code 44 added to claim:  Yes    Colen Darling, [place] 08/12/2022, 2:19 PM

## 2022-08-12 NOTE — ED Notes (Signed)
Pt to and from bathroom independently and without difficulty

## 2022-08-12 NOTE — Assessment & Plan Note (Signed)
H?o seizure d/o no meds in chart.a dn to d/c her neurology to start keppra. eeg

## 2022-08-12 NOTE — Assessment & Plan Note (Signed)
We will start pt on thiamine.  Ethanol level pending. Suspect AG from lactic acid level.

## 2022-08-12 NOTE — Assessment & Plan Note (Addendum)
Vitals:   08/11/22 1553 08/11/22 2230 08/11/22 2335 08/12/22 0000  BP: 99/62 (!) 97/52 (!) 104/52 100/65   We will be cautious with meds that lower bp  MIVF overnight.  Type and screen. IV ppi.  Orthostatic negative. MRI brain pending.

## 2022-08-12 NOTE — ED Notes (Signed)
Pt back from MRI 

## 2022-08-12 NOTE — ED Notes (Signed)
Assumed care of pt. Pt ambulates to restroom with steady gait

## 2022-08-12 NOTE — H&P (Addendum)
History and Physical    Chief Complaint: Syncope   HISTORY OF PRESENT ILLNESS: [name] is an 55 y.o. female brought to the emergency room for syncope. Patient is alert awake and oriented and gives history in the emergency room states that she went up to use the bathroom And passed out and the next thing she knew her children were at her side and calling EMS. She was sent unconscious for long but she was not unconscious,And had no  incontinence of urine or stool or tongue biting. Patient does report that she feels like she has a pain all over. CPK is pending. Patient was recently discharged after being treated for bacteremia and was afraid that she has a bacterial infection again. Patient has a history of drug abuse.  Pt has PMH as below: Past Medical History:  Diagnosis Date   Abnormal uterine bleeding    ADHD (attention deficit hyperactivity disorder)    Allergy    Anxiety    Bipolar 1 disorder (HCC)    Bipolar disorder (HCC)    Cancer (HCC)    uterine cancer   DDD (degenerative disc disease)    Depression    HSV infection    Hx: UTI (urinary tract infection)    Pertussis    Seizures ([place])    last seizure 12/2014    Thyroid disease      Review of Systems  Musculoskeletal:  Positive for myalgias.  Neurological:  Positive for syncope.  All other systems reviewed and are negative.     Allergies  Allergen Reactions   Naproxen Anaphylaxis   Morphine And Related Itching     Past Surgical History:  Procedure Laterality Date   ABDOMINAL HYSTERECTOMY     CERVICAL SPINE SURGERY     x 3 2001,2005,2007 c5-6   CESAREAN SECTION  2003   EMBOLIZATION N/A 12/16/2017   Procedure: EMBOLIZATION;  Surgeon: Algernon Huxley, MD;  Location: [facility];  Service: Cardiovascular;  Laterality: N/A;   ESOPHAGOGASTRODUODENOSCOPY (EGD) WITH PROPOFOL N/A 11/01/2019   Procedure: ESOPHAGOGASTRODUODENOSCOPY (EGD) WITH PROPOFOL;  Surgeon: Robert Bellow, MD;  Location:  [facility];  Service: Endoscopy;  Laterality: N/A;  POSITIVE ON 09/29/19 and 10/06/19   HYSTEROSCOPY N/A 10/11/2017   Procedure: HYSTEROSCOPY;  Surgeon: Harlin Heys, MD;  Location: [facility];  Service: Gynecology;  Laterality: N/A;   HYSTEROSCOPY WITH D & C N/A 10/11/2017   Procedure: DILATATION AND CURETTAGE /HYSTEROSCOPY;  Surgeon: Harlin Heys, MD;  Location: [facility];  Service: Gynecology;  Laterality: N/A;   LAPAROSCOPIC BILATERAL SALPINGO OOPHERECTOMY Bilateral 12/15/2017   Procedure: LAPAROSCOPIC BILATERAL SALPINGO OOPHORECTOMY;  Surgeon: Mellody Drown, MD;  Location: [facility];  Service: Gynecology;  Laterality: Bilateral;   LAPAROSCOPIC HYSTERECTOMY Bilateral 12/15/2017   Procedure: HYSTERECTOMY TOTAL LAPAROSCOPIC;  Surgeon: Mellody Drown, MD;  Location: [facility];  Service: Gynecology;  Laterality: Bilateral;   SENTINEL NODE BIOPSY N/A 12/15/2017   Procedure: SENTINEL NODE OMVEHM,CNOBSJG;  Surgeon: Mellody Drown, MD;  Location: [facility];  Service: Gynecology;  Laterality: N/A;   TEE WITHOUT CARDIOVERSION N/A 07/13/2022   Procedure: TRANSESOPHAGEAL ECHOCARDIOGRAM (TEE);  Surgeon: Minna Merritts, MD;  Location: [facility];  Service: Cardiovascular;  Laterality: N/A;   TONSILLECTOMY     TUBAL LIGATION        Social History   Socioeconomic History   Marital status: Married    Spouse name: Not on file   Number of children: 4   Years of education: Not on file  Highest education level: Not on file  Occupational History   Not on file  Tobacco Use   Smoking status: Never   Smokeless tobacco: Never  Vaping Use   Vaping Use: Some days  Substance and Sexual Activity   Alcohol use: Yes    Comment: occassionally    Drug use: Not Currently    Comment: last used in 1990's   Sexual activity: Yes    Birth control/protection: Surgical  Other Topics Concern   Not on file  Social History Narrative   Not on file   Social Determinants of Health   Financial  Resource Strain: Not on file  Food Insecurity: Food Insecurity Present (07/08/2022)   Hunger Vital Sign    Worried About Running Out of Food in the Last Year: Never true    Ran Out of Food in the Last Year: Sometimes true  Transportation Needs: No Transportation Needs (07/08/2022)   PRAPARE - Hydrologist (Medical): No    Lack of Transportation (Non-Medical): No  Physical Activity: Not on file  Stress: Not on file  Social Connections: Not on file      CURRENT MEDS:     Current Outpatient Medications (Cardiovascular):    EPINEPHrine 0.3 mg/0.3 mL IJ SOAJ injection, Inject 0.3 mg into the muscle once as needed (severe allergic reaction).     Current Outpatient Medications (Analgesics):    acetaminophen (TYLENOL) 500 MG tablet, Take 500-1,000 mg by mouth every 6 (six) hours as needed for mild pain or headache.    Oxycodone HCl 10 MG TABS, Take 10 mg by mouth 4 (four) times daily as needed (moderate to severe pain).  Current Facility-Administered Medications (Hematological):    heparin injection 5,000 Units   Current Facility-Administered Medications (Other):    pantoprazole (PROTONIX) injection 40 mg   sodium chloride flush (NS) 0.9 % injection 3 mL   thiamine (VITAMIN B1) injection 100 mg  Current Outpatient Medications (Other):    amphetamine-dextroamphetamine (ADDERALL) 10 MG tablet, Take 10 mg by mouth 2 (two) times daily.   cyclobenzaprine (FLEXERIL) 10 MG tablet, Take 10 mg by mouth 3 (three) times daily as needed for muscle spasms.   DULoxetine (CYMBALTA) 30 MG capsule, Take 30 mg by mouth 2 (two) times daily.   gabapentin (NEURONTIN) 300 MG capsule, Take 300 mg by mouth 3 (three) times daily.    ED Course: Pt in Ed alert awake oriented afebrile O2 sats within normal limits. Orthostatic vitals negative.  Vitals:   08/11/22 2335 08/12/22 0000 08/12/22 0026 08/12/22 0156  BP: (!) 104/52 100/65    Pulse: 87 95    Resp: 16 16    Temp:    98.5 F (36.9 C)   TempSrc:   Oral   SpO2: 100% 99%    Weight:    62 kg  Height:    '5\' 1"'$  (1.549 m)   Total I/O In: 2343.8 [IV Piggyback:2343.8] Out: 600 [Urine:600] SpO2: 99 % Blood work in ed shows: Serum bicarb of 18, kidney functions. Lactic acid level of 2.6 which is resolved at 1.5. CBC shows leukocytosis of 12.2, hemoglobin of 11.3 and normal platelet count of 331. No clear source of infection. Results for orders placed or performed during the hospital encounter of 08/11/22 (from the past 48 hour(s))  Resp Panel by RT-PCR (Flu A&B, Covid) Anterior Nasal Swab     Status: None   Collection Time: 08/11/22  4:01 PM   Specimen: Anterior Nasal Swab  Result  Value Ref Range   SARS Coronavirus 2 by RT PCR NEGATIVE NEGATIVE    Comment: (NOTE) SARS-CoV-2 target nucleic acids are NOT DETECTED.  The SARS-CoV-2 RNA is generally detectable in upper respiratory specimens during the acute phase of infection. The lowest concentration of SARS-CoV-2 viral copies this assay can detect is 138 copies/mL. A negative result does not preclude SARS-Cov-2 infection and should not be used as the sole basis for treatment or other patient management decisions. A negative result may occur with  improper specimen collection/handling, submission of specimen other than nasopharyngeal swab, presence of viral mutation(s) within the areas targeted by this assay, and inadequate number of viral copies(<138 copies/mL). A negative result must be combined with clinical observations, patient history, and epidemiological information. The expected result is Negative.  Fact Sheet for Patients:  EntrepreneurPulse.com.au  Fact Sheet for Healthcare Providers:  IncredibleEmployment.be  This test is no t yet approved or cleared by the [place] FDA and  has been authorized for detection and/or diagnosis of SARS-CoV-2 by FDA under an Emergency Use Authorization (EUA). This EUA  will remain  in effect (meaning this test can be used) for the duration of the COVID-19 declaration under Section 564(b)(1) of the Act, 21 U.S.C.section 360bbb-3(b)(1), unless the authorization is terminated  or revoked sooner.       Influenza A by PCR NEGATIVE NEGATIVE   Influenza B by PCR NEGATIVE NEGATIVE    Comment: (NOTE) The Xpert Xpress SARS-CoV-2/FLU/RSV plus assay is intended as an aid in the diagnosis of influenza from Nasopharyngeal swab specimens and should not be used as a sole basis for treatment. Nasal washings and aspirates are unacceptable for Xpert Xpress SARS-CoV-2/FLU/RSV testing.  Fact Sheet for Patients: EntrepreneurPulse.com.au  Fact Sheet for Healthcare Providers: IncredibleEmployment.be  This test is not yet approved or cleared by the [place] FDA and has been authorized for detection and/or diagnosis of SARS-CoV-2 by FDA under an Emergency Use Authorization (EUA). This EUA will remain in effect (meaning this test can be used) for the duration of the COVID-19 declaration under Section 564(b)(1) of the Act, 21 U.S.C. section 360bbb-3(b)(1), unless the authorization is terminated or revoked.  Performed at [facility], [place], [place] [address]   Troponin I (High Sensitivity)     Status: None   Collection Time: 08/11/22  4:01 PM  Result Value Ref Range   Troponin I (High Sensitivity) 6 <18 ng/L    Comment: (NOTE) Elevated high sensitivity troponin I (hsTnI) values and significant  changes across serial measurements may suggest ACS but many other  chronic and acute conditions are known to elevate hsTnI results.  Refer to the "Links" section for chest pain algorithms and additional  guidance. Performed at [facility], [address] [place], [place] [address]   Basic metabolic panel     Status: Abnormal   Collection Time: 08/11/22  4:02 PM  Result Value Ref Range    Sodium 136 135 - 145 mmol/L   Potassium 3.9 3.5 - 5.1 mmol/L   Chloride 109 98 - 111 mmol/L   CO2 18 (L) 22 - 32 mmol/L   Glucose, Bld 96 70 - 99 mg/dL    Comment: Glucose reference range applies only to samples taken after fasting for at least 8 hours.   BUN 16 6 - 20 mg/dL   Creatinine, Ser 1.00 0.44 - 1.00 mg/dL   Calcium 8.9 8.9 - 10.3 mg/dL   GFR, Estimated >60 >60 mL/min  Comment: (NOTE) Calculated using the CKD-EPI Creatinine Equation (2021)    Anion gap 9 5 - 15    Comment: Performed at [facility], [address] [place], [place] [address]  CBC     Status: Abnormal   Collection Time: 08/11/22  4:02 PM  Result Value Ref Range   WBC 12.2 (H) 4.0 - 10.5 K/uL   RBC 3.94 3.87 - 5.11 MIL/uL   Hemoglobin 11.3 (L) 12.0 - 15.0 g/dL   HCT 34.7 (L) 36.0 - 46.0 %   MCV 88.1 80.0 - 100.0 fL   MCH 28.7 26.0 - 34.0 pg   MCHC 32.6 30.0 - 36.0 g/dL   RDW 14.6 11.5 - 15.5 %   Platelets 331 150 - 400 K/uL   nRBC 0.0 0.0 - 0.2 %    Comment: Performed at [facility], [address] [place], [place] [address]  Urinalysis, Routine w reflex microscopic     Status: Abnormal   Collection Time: 08/11/22  4:02 PM  Result Value Ref Range   Color, Urine YELLOW (A) YELLOW   APPearance CLOUDY (A) CLEAR   Specific Gravity, Urine 1.019 1.005 - 1.030   pH 5.0 5.0 - 8.0   Glucose, UA NEGATIVE NEGATIVE mg/dL   Hgb urine dipstick NEGATIVE NEGATIVE   Bilirubin Urine NEGATIVE NEGATIVE   Ketones, ur NEGATIVE NEGATIVE mg/dL   Protein, ur 100 (A) NEGATIVE mg/dL   Nitrite NEGATIVE NEGATIVE   Leukocytes,Ua NEGATIVE NEGATIVE   RBC / HPF 0-5 0 - 5 RBC/hpf   WBC, UA 6-10 0 - 5 WBC/hpf   Bacteria, UA NONE SEEN NONE SEEN   Squamous Epithelial / LPF 0-5 0 - 5   Mucus PRESENT    Hyaline Casts, UA PRESENT     Comment: Performed at [facility], [address] [place], [place] [address]  Lactic acid, plasma     Status: Abnormal   Collection Time: 08/11/22  4:02 PM  Result  Value Ref Range   Lactic Acid, Venous 2.6 (HH) 0.5 - 1.9 mmol/L    Comment: CRITICAL RESULT CALLED TO, READ BACK BY AND VERIFIED WITH JACKY FERGUSON AT 1710 ON 08/11/22 BY SS Performed at [facility], [address] [place], [place] [address]   Lactic acid, plasma     Status: None   Collection Time: 08/11/22 11:36 PM  Result Value Ref Range   Lactic Acid, Venous 1.5 0.5 - 1.9 mmol/L    Comment: Performed at [facility], [address] [place], [place] [address]    In Ed pt received  Meds ordered this encounter  Medications   sodium chloride 0.9 % bolus 1,000 mL   ceFEPIme (MAXIPIME) 2 g in sodium chloride 0.9 % 100 mL IVPB    Order Specific Question:   Antibiotic Indication:    Answer:   Sepsis   vancomycin (VANCOREADY) IVPB 1250 mg/250 mL    Order Specific Question:   Indication:    Answer:   Sepsis   sodium chloride 0.9 % bolus 1,000 mL   iohexol (OMNIPAQUE) 350 MG/ML injection 75 mL   sodium chloride flush (NS) 0.9 % injection 3 mL   heparin injection 5,000 Units   pantoprazole (PROTONIX) injection 40 mg   thiamine (VITAMIN B1) injection 100 mg    Unresulted Labs (From admission, onward)     Start     Ordered   08/13/22 0500  Procalcitonin  Daily at 5am,   URGENT  08/12/22 0028   08/12/22 0228  Ethanol  Add-on,   AD        08/12/22 0227   08/12/22 0119  CK  Add-on,   AD        08/12/22 0118   08/12/22 0116  Type and screen  Once,   STAT        08/12/22 0115   08/12/22 0104  Hepatic function panel  Add-on,   AD        08/12/22 0103   08/12/22 0030  High sensitivity CRP  Once,   URGENT        08/12/22 0029   08/12/22 0029  Procalcitonin - Baseline  ONCE - URGENT,   URGENT        08/12/22 0028   08/11/22 2241  Blood Culture (routine x 2)  (Undifferentiated presentation (screening labs and basic nursing orders))  BLOOD CULTURE X 2,   STAT      08/11/22 2242   08/11/22 2241  Urine Culture  (Undifferentiated presentation (screening labs and  basic nursing orders))  ONCE - URGENT,   URGENT       Question:  Indication  Answer:  Sepsis   08/11/22 2242   08/11/22 1600  Blood culture (single)  Once,   STAT        08/11/22 1559             Admission Imaging : MR BRAIN WO CONTRAST  Result Date: 08/12/2022 CLINICAL DATA:  Initial evaluation for syncope/presyncope. EXAM: MRI HEAD WITHOUT CONTRAST TECHNIQUE: Multiplanar, multiecho pulse sequences of the brain and surrounding structures were obtained without intravenous contrast. COMPARISON:  Prior study from 07/08/2022. FINDINGS: Brain: Cerebral volume within normal limits for age. No focal parenchymal signal abnormality. No abnormal foci of restricted diffusion to suggest acute or subacute ischemia. Gray-white matter differentiation well maintained. No encephalomalacia to suggest chronic cortical infarction or other insult. No foci of susceptibility artifact indicative of acute or chronic intracranial blood products. No mass lesion, midline shift or mass effect. Ventricles normal in size and morphology without hydrocephalus. No extra-axial fluid collection. Pituitary gland and suprasellar region within normal limits. Vascular: Major intracranial vascular flow voids are well maintained. Skull and upper cervical spine: Craniocervical junction within normal limits. Visualized upper cervical spine demonstrates no significant finding. Bone marrow signal intensity within normal limits. No scalp soft tissue abnormality. Sinuses/Orbits: Globes and orbital soft tissues are within normal limits. Scattered mucosal thickening noted about the ethmoidal air cells. Trace left mastoid effusion, of doubtful significance. Other: None. IMPRESSION: Normal brain MRI. No acute intracranial abnormality identified. Electronically Signed   By: Jeannine Boga M.D.   On: 08/12/2022 02:14   CT Angio Chest PE W/Cm &/Or Wo Cm  Result Date: 08/11/2022 CLINICAL DATA:  High probability for PE. Syncope and chest  pressure. EXAM: CT ANGIOGRAPHY CHEST WITH CONTRAST TECHNIQUE: Multidetector CT imaging of the chest was performed using the standard protocol during bolus administration of intravenous contrast. Multiplanar CT image reconstructions and MIPs were obtained to evaluate the vascular anatomy. RADIATION DOSE REDUCTION: This exam was performed according to the departmental dose-optimization program which includes automated exposure control, adjustment of the mA and/or kV according to patient size and/or use of iterative reconstruction technique. CONTRAST:  24m OMNIPAQUE IOHEXOL 350 MG/ML SOLN COMPARISON:  CT angiogram chest 07/08/2022 FINDINGS: Cardiovascular: Satisfactory opacification of the pulmonary arteries to the segmental level. No evidence of pulmonary embolism. Normal heart size. No pericardial effusion. Mediastinum/Nodes: No enlarged mediastinal, hilar, or axillary  lymph nodes. Thyroid gland, trachea, and esophagus demonstrate no significant findings. Lungs/Pleura: There is a calcified granuloma in the left upper lobe. The lungs are otherwise clear. There is no pleural effusion or pneumothorax. Upper Abdomen: No acute abnormality. Musculoskeletal: Cervical spinal fusion plate is partially visualized. No acute fractures. Review of the MIP images confirms the above findings. IMPRESSION: No evidence for pulmonary embolism or other acute cardiopulmonary process. Electronically Signed   By: Ronney Asters M.D.   On: 08/11/2022 23:31   DG Chest 2 View  Result Date: 08/11/2022 CLINICAL DATA:  Syncopal episode and chest pain. EXAM: CHEST - 2 VIEW COMPARISON:  July 08, 2022 FINDINGS: Cardiomediastinal contours and hilar structures are normal. Lungs are clear. No pneumothorax. No pleural effusion. On limited assessment no acute skeletal findings. IMPRESSION: No active cardiopulmonary disease. Electronically Signed   By: Zetta Bills M.D.   On: 08/11/2022 16:34      Physical Examination: Vitals:   08/11/22  2335 08/12/22 0000 08/12/22 0026 08/12/22 0156  BP: (!) 104/52 100/65    Pulse: 87 95    Temp:   98.5 F (36.9 C)   Resp: 16 16    Height:    '5\' 1"'$  (1.549 m)  Weight:    62 kg  SpO2: 100% 99%    TempSrc:   Oral   BMI (Calculated):    25.84   Physical Exam Vitals and nursing note reviewed.  Constitutional:      General: She is not in acute distress.    Appearance: Normal appearance. She is not ill-appearing, toxic-appearing or diaphoretic.  HENT:     Head: Normocephalic and atraumatic.     Right Ear: Hearing and external ear normal.     Left Ear: Hearing and external ear normal.     Nose: Nose normal. No nasal deformity.     Mouth/Throat:     Lips: Pink.     Mouth: Mucous membranes are moist.     Tongue: No lesions.     Pharynx: Oropharynx is clear.  Eyes:     Extraocular Movements: Extraocular movements intact.     Pupils: Pupils are equal, round, and reactive to light.  Neck:     Vascular: No carotid bruit.  Cardiovascular:     Rate and Rhythm: Normal rate and regular rhythm.     Pulses: Normal pulses.     Heart sounds: Normal heart sounds.  Pulmonary:     Effort: Pulmonary effort is normal.     Breath sounds: Normal breath sounds.  Abdominal:     General: Bowel sounds are normal. There is no distension.     Palpations: Abdomen is soft. There is no mass.     Tenderness: There is no abdominal tenderness. There is no guarding.     Hernia: No hernia is present.  Musculoskeletal:     Right lower leg: No edema.     Left lower leg: No edema.  Skin:    General: Skin is warm.  Neurological:     General: No focal deficit present.     Mental Status: She is alert and oriented to person, place, and time.     Cranial Nerves: Cranial nerves 2-12 are intact.     Motor: Motor function is intact.  Psychiatric:        Attention and Perception: Attention normal.        Mood and Affect: Mood normal.        Speech: Speech normal.  Behavior: Behavior normal. Behavior is  cooperative.        Cognition and Memory: Cognition normal.        Assessment and Plan: * Syncope Patient is at risk for syncope as she is on Cymbalta in addition to flexeril putting her at risk for falls.  Fall precaution.   Sepsis ([place]) Pt meets SIRS criteria do not suspect infection. Pt has received IV abx due to lactic acid level. We will continue and follow.  MIVF LR at 75 cc/ hour.   AKI (acute kidney injury) [facility]) Lab Results  Component Value Date   CREATININE 1.00 08/11/2022   CREATININE 0.95 07/30/2022   CREATININE 0.78 07/13/2022  resolved.  We will follow and monitor.     Hypotension Vitals:   08/11/22 1553 08/11/22 2230 08/11/22 2335 08/12/22 0000  BP: 99/62 (!) 97/52 (!) 104/52 100/65   We will be cautious with meds that lower bp  MIVF overnight.  Type and screen. IV ppi.  Orthostatic negative. MRI brain pending.   Seizure ([place]) H?o seizure d/o no meds in chart.a dn to d/c her neurology to start keppra. eeg   Alcoholism ([place]) We will start pt on thiamine.  Ethanol level pending. Suspect AG from lactic acid level.       DVT prophylaxis:  Heparin     Code Status:  Full code    Family Communication:  Cortasia, Screws (Spouse)  [phone] (Home Phone)   Disposition Plan:  Home    Consults called:  None   Admission status: Inpatient    Unit/ Expected LOS: Med teel/ 2 days.   Para Skeans MD [facility]  6 PM- 2 AM. Please contact me via secure Chat 6 PM-2 AM. [phone] ( Pager ) To contact the [facility] Attending or Consulting provider [place] or covering provider during after hours [place], for this patient.   Check the care team in [facility] and look for a) attending/consulting [facility] provider listed and b) the [facility] team listed Log into [email or web address] and use 's universal password to access. If you do not have the password, please contact the hospital operator. Locate the [facility] provider you are looking for under [facility] and page to a number that you can be directly reached. If you still have difficulty reaching the provider, please page the [facility] (Director on Call) for the Hospitalists listed on amion for assistance. [email or web address] 08/12/2022, 2:30 AM

## 2022-08-12 NOTE — Consult Note (Signed)
Pharmacy Antibiotic Note  [name] is a 56 y.o. female admitted on 08/11/2022 with sepsis.  Pharmacy has been consulted for Vancomycin and cefepime dosing.  Plan: Cefepime 2gm q 12 hrs (CrCl 52 ml/min)  @ Vancomycin '1250mg'$  given 10/17 @ 2338, then  Vancomycin 1000 mg IV Q 24 hrs.  Goal AUC 400-550. Expected AUC: 517 SCr used: 1  Height: '5\' 1"'$  (154.9 cm) Weight: 62 kg (136 lb 11 oz) IBW/kg (Calculated) : 47.8  Temp (24hrs), Avg:98.8 F (37.1 C), Min:98 F (36.7 C), Max:99.6 F (37.6 C)  Recent Labs  Lab 08/11/22 1602 08/11/22 2336  WBC 12.2*  --   CREATININE 1.00  --   LATICACIDVEN 2.6* 1.5    Estimated Creatinine Clearance: 52.4 mL/min (by C-G formula based on SCr of 1 mg/dL).    Allergies  Allergen Reactions   Naproxen Anaphylaxis   Morphine And Related Itching    Antimicrobials this admission: Vancomycin 10/17 >>  cefepime 10.17 >>   Dose adjustments this admission: none  Microbiology results: 10/17 BCx: in process 10/17 UCx: in process   Thank you for allowing pharmacy to be a part of this patient's care.  Chanley Mcenery Rodriguez-Guzman PharmD, BCPS 08/12/2022 8:51 AM

## 2022-08-13 DIAGNOSIS — F319 Bipolar disorder, unspecified: Secondary | ICD-10-CM | POA: Diagnosis present

## 2022-08-13 DIAGNOSIS — A419 Sepsis, unspecified organism: Secondary | ICD-10-CM | POA: Diagnosis present

## 2022-08-13 DIAGNOSIS — G40909 Epilepsy, unspecified, not intractable, without status epilepticus: Secondary | ICD-10-CM | POA: Diagnosis present

## 2022-08-13 DIAGNOSIS — F17290 Nicotine dependence, other tobacco product, uncomplicated: Secondary | ICD-10-CM | POA: Diagnosis present

## 2022-08-13 DIAGNOSIS — B9689 Other specified bacterial agents as the cause of diseases classified elsewhere: Secondary | ICD-10-CM | POA: Diagnosis present

## 2022-08-13 DIAGNOSIS — R6520 Severe sepsis without septic shock: Secondary | ICD-10-CM | POA: Diagnosis present

## 2022-08-13 DIAGNOSIS — Z1152 Encounter for screening for COVID-19: Secondary | ICD-10-CM | POA: Diagnosis not present

## 2022-08-13 DIAGNOSIS — G8929 Other chronic pain: Secondary | ICD-10-CM | POA: Diagnosis present

## 2022-08-13 DIAGNOSIS — R7881 Bacteremia: Secondary | ICD-10-CM | POA: Diagnosis not present

## 2022-08-13 DIAGNOSIS — I351 Nonrheumatic aortic (valve) insufficiency: Secondary | ICD-10-CM | POA: Diagnosis not present

## 2022-08-13 DIAGNOSIS — F909 Attention-deficit hyperactivity disorder, unspecified type: Secondary | ICD-10-CM | POA: Diagnosis present

## 2022-08-13 DIAGNOSIS — Z885 Allergy status to narcotic agent status: Secondary | ICD-10-CM | POA: Diagnosis not present

## 2022-08-13 DIAGNOSIS — Z886 Allergy status to analgesic agent status: Secondary | ICD-10-CM | POA: Diagnosis not present

## 2022-08-13 DIAGNOSIS — K219 Gastro-esophageal reflux disease without esophagitis: Secondary | ICD-10-CM | POA: Diagnosis present

## 2022-08-13 DIAGNOSIS — N179 Acute kidney failure, unspecified: Secondary | ICD-10-CM | POA: Diagnosis present

## 2022-08-13 DIAGNOSIS — I340 Nonrheumatic mitral (valve) insufficiency: Secondary | ICD-10-CM | POA: Diagnosis not present

## 2022-08-13 DIAGNOSIS — Z8542 Personal history of malignant neoplasm of other parts of uterus: Secondary | ICD-10-CM | POA: Diagnosis not present

## 2022-08-13 DIAGNOSIS — D649 Anemia, unspecified: Secondary | ICD-10-CM | POA: Diagnosis present

## 2022-08-13 DIAGNOSIS — R55 Syncope and collapse: Secondary | ICD-10-CM | POA: Diagnosis present

## 2022-08-13 DIAGNOSIS — F1020 Alcohol dependence, uncomplicated: Secondary | ICD-10-CM | POA: Diagnosis present

## 2022-08-13 DIAGNOSIS — I959 Hypotension, unspecified: Secondary | ICD-10-CM | POA: Diagnosis not present

## 2022-08-13 LAB — BASIC METABOLIC PANEL
Anion gap: 6 (ref 5–15)
BUN: 14 mg/dL (ref 6–20)
CO2: 24 mmol/L (ref 22–32)
Calcium: 8.7 mg/dL — ABNORMAL LOW (ref 8.9–10.3)
Chloride: 109 mmol/L (ref 98–111)
Creatinine, Ser: 0.82 mg/dL (ref 0.44–1.00)
GFR, Estimated: >60 mL/min (ref >60)
Glucose, Bld: 95 mg/dL (ref 70–99)
Potassium: 4.2 mmol/L (ref 3.5–5.1)
Sodium: 139 mmol/L (ref 135–145)

## 2022-08-13 LAB — CBC WITH DIFFERENTIAL/PLATELET
Abs Immature Granulocytes: 0.01 10*3/uL (ref 0.00–0.07)
Basophils Absolute: 0 10*3/uL (ref 0.0–0.1)
Basophils Relative: 1 %
Eosinophils Absolute: 0.3 10*3/uL (ref 0.0–0.5)
Eosinophils Relative: 5 %
HCT: 28.5 % — ABNORMAL LOW (ref 36.0–46.0)
Hemoglobin: 9.4 g/dL — ABNORMAL LOW (ref 12.0–15.0)
Immature Granulocytes: 0 %
Lymphocytes Relative: 44 %
Lymphs Abs: 2.4 10*3/uL (ref 0.7–4.0)
MCH: 29.5 pg (ref 26.0–34.0)
MCHC: 33 g/dL (ref 30.0–36.0)
MCV: 89.3 fL (ref 80.0–100.0)
Monocytes Absolute: 0.4 10*3/uL (ref 0.1–1.0)
Monocytes Relative: 8 %
Neutro Abs: 2.3 10*3/uL (ref 1.7–7.7)
Neutrophils Relative %: 42 %
Platelets: 149 10*3/uL — ABNORMAL LOW (ref 150–400)
RBC: 3.19 MIL/uL — ABNORMAL LOW (ref 3.87–5.11)
RDW: 14.6 % (ref 11.5–15.5)
WBC: 5.4 10*3/uL (ref 4.0–10.5)
nRBC: 0 % (ref 0.0–0.2)

## 2022-08-13 LAB — GLUCOSE, CAPILLARY
Glucose-Capillary: 57 mg/dL — ABNORMAL LOW (ref 70–99)
Glucose-Capillary: 99 mg/dL (ref 70–99)
Glucose-Capillary: 103 mg/dL — ABNORMAL HIGH (ref 70–99)
Glucose-Capillary: 109 mg/dL — ABNORMAL HIGH (ref 70–99)
Glucose-Capillary: 81 mg/dL (ref 70–99)

## 2022-08-13 LAB — URINE CULTURE: Culture: 30000 — AB

## 2022-08-13 LAB — PROCALCITONIN: Procalcitonin: 8.33 ng/mL

## 2022-08-13 LAB — HIGH SENSITIVITY CRP: CRP, High Sensitivity: 58.63 mg/L — ABNORMAL HIGH (ref 0.00–3.00)

## 2022-08-13 LAB — CORTISOL: Cortisol, Plasma: 6.3 ug/dL

## 2022-08-13 MED ORDER — SODIUM CHLORIDE 0.9 % IV SOLN
2.0000 g | Freq: Three times a day (TID) | INTRAVENOUS | Status: DC
  Administered 2022-08-13 – 2022-08-15 (×6): 2 g via INTRAVENOUS
  Filled 2022-08-13 (×7): qty 12.5
  Filled 2022-08-13: qty 2

## 2022-08-13 NOTE — Plan of Care (Signed)

## 2022-08-13 NOTE — Progress Notes (Signed)
PROGRESS NOTE    [name]  HKV:[id number] DOB: Nov 29, 1965 DOA: 08/11/2022 PCP: Sandi Mariscal, MD    Brief Narrative:  56 year old female who presents for evaluation after syncopal event.  Unclear etiology of syncope.  Orthostatics negative.  At this time unable to exclude infection.  Patient was previously on long-term IV antibiotics for bacteremia.  Source at that time was unclear.  Procalcitonin markedly elevated.  Urinalysis not indicative of infection, chest x-ray negative.  Blood cultures taken on admission   Now growing Enterobacter species.  Vancomycin discontinued.  Cefepime continued.  Infectious disease consulted.  Recommendations pending.   Assessment & Plan:   Principal Problem:   Syncope Active Problems:   Sepsis ([place])   AKI (acute kidney injury) ([place])   Alcoholism ([place])   Seizure ([place])   Hypotension  Syncope No recurrent episodes.  Home Cymbalta and Flexeril and pain regimen have been held.  Orthostatics negative. Plan: Therapy as tolerated Fall precautions Okay for home regimen Intravenous fluids  Sepsis Gram negative bacteremia Patient presented with markedly elevated procalcitonin.  No clear source of infection.  Patient has a recent long-term antibiotic course for bacteremia of unclear etiology.  Current blood cultures positive for Enterobacter.  Lactobacillus growing in the urine, questionable contaminant. Plan: Cefepime Discontinue vancomycin IV fluids as above Infectious disease consult  Acute kidney injury Mild IV fluids as above  History of seizure disorder No meds on admission Outpatient follow-up  Alcohol use disorder Daily thiamine  DVT prophylaxis: SQ heparin Code Status: Full Family Communication: None today Disposition Plan: Status is: Observation The patient will require care spanning > 2 midnights and should be moved to inpatient because: Sepsis.  Positive blood cultures   Level of care: Med-Surg  Consultants:   ID  Procedures:  None  Antimicrobials: Cefepime   Subjective: Seen and examined peer resting in bed.  No visible distress.  No recurrence of syncopal or presyncopal events.  No pain complaints.  Objective: Vitals:   08/12/22 2012 08/13/22 0452 08/13/22 0500 08/13/22 0857  BP: 116/72 130/83  114/72  Pulse: 83 80  78  Resp: '20 18  16  '$ Temp: 97.9 F (36.6 C) 97.8 F (36.6 C)  98.7 F (37.1 C)  TempSrc: Oral     SpO2: 100% 98%  98%  Weight:   60.7 kg   Height:        Intake/Output Summary (Last 24 hours) at 08/13/2022 1045 Last data filed at 08/13/2022 0900 Gross per 24 hour  Intake 1130.91 ml  Output --  Net 1130.91 ml   Filed Weights   08/12/22 0156 08/13/22 0500  Weight: 62 kg 60.7 kg    Examination:  General exam: Appears calm and comfortable  Respiratory system: Clear to auscultation. Respiratory effort normal. Cardiovascular system: S1-S2, RRR, no murmurs, no pedal edema Gastrointestinal system: Soft, NT/ND, normal bowel sounds Central nervous system: Alert and oriented. No focal neurological deficits. Extremities: Symmetric 5 x 5 power. Skin: No rashes, lesions or ulcers Psychiatry: Judgement and insight appear normal. Mood & affect appropriate.     Data Reviewed: I have personally reviewed following labs and imaging studies  CBC: Recent Labs  Lab 08/11/22 1602 08/13/22 0445  WBC 12.2* 5.4  NEUTROABS  --  2.3  HGB 11.3* 9.4*  HCT 34.7* 28.5*  MCV 88.1 89.3  PLT 331 564*   Basic Metabolic Panel: Recent Labs  Lab 08/11/22 1602 08/13/22 0445  NA 136 139  K 3.9 4.2  CL 109 109  CO2  18* 24  GLUCOSE 96 95  BUN 16 14  CREATININE 1.00 0.82  CALCIUM 8.9 8.7*   GFR: Estimated Creatinine Clearance: 63.3 mL/min (by C-G formula based on SCr of 0.82 mg/dL). Liver Function Tests: Recent Labs  Lab 08/12/22 0210  AST 26  ALT 15  ALKPHOS 62  BILITOT 0.9  PROT 6.4*  ALBUMIN 3.1*   No results for input(s): "LIPASE", "AMYLASE" in the last  168 hours. No results for input(s): "AMMONIA" in the last 168 hours. Coagulation Profile: No results for input(s): "INR", "PROTIME" in the last 168 hours. Cardiac Enzymes: Recent Labs  Lab 08/12/22 0210  CKTOTAL 45   BNP (last 3 results) No results for input(s): "PROBNP" in the last 8760 hours. HbA1C: No results for input(s): "HGBA1C" in the last 72 hours. CBG: Recent Labs  Lab 08/12/22 0518 08/13/22 0454 08/13/22 0546 08/13/22 0753  GLUCAP 112* 57* 99 103*   Lipid Profile: No results for input(s): "CHOL", "HDL", "LDLCALC", "TRIG", "CHOLHDL", "LDLDIRECT" in the last 72 hours. Thyroid Function Tests: No results for input(s): "TSH", "T4TOTAL", "FREET4", "T3FREE", "THYROIDAB" in the last 72 hours. Anemia Panel: No results for input(s): "VITAMINB12", "FOLATE", "FERRITIN", "TIBC", "IRON", "RETICCTPCT" in the last 72 hours. Sepsis Labs: Recent Labs  Lab 08/11/22 1602 08/11/22 2336 08/12/22 0210 08/13/22 0445  PROCALCITON  --   --  15.48 8.33  LATICACIDVEN 2.6* 1.5  --   --     Recent Results (from the past 240 hour(s))  Resp Panel by RT-PCR (Flu A&B, Covid) Anterior Nasal Swab     Status: None   Collection Time: 08/11/22  4:01 PM   Specimen: Anterior Nasal Swab  Result Value Ref Range Status   SARS Coronavirus 2 by RT PCR NEGATIVE NEGATIVE Final    Comment: (NOTE) SARS-CoV-2 target nucleic acids are NOT DETECTED.  The SARS-CoV-2 RNA is generally detectable in upper respiratory specimens during the acute phase of infection. The lowest concentration of SARS-CoV-2 viral copies this assay can detect is 138 copies/mL. A negative result does not preclude SARS-Cov-2 infection and should not be used as the sole basis for treatment or other patient management decisions. A negative result may occur with  improper specimen collection/handling, submission of specimen other than nasopharyngeal swab, presence of viral mutation(s) within the areas targeted by this assay, and  inadequate number of viral copies(<138 copies/mL). A negative result must be combined with clinical observations, patient history, and epidemiological information. The expected result is Negative.  Fact Sheet for Patients:  EntrepreneurPulse.com.au  Fact Sheet for Healthcare Providers:  IncredibleEmployment.be  This test is no t yet approved or cleared by the [place] FDA and  has been authorized for detection and/or diagnosis of SARS-CoV-2 by FDA under an Emergency Use Authorization (EUA). This EUA will remain  in effect (meaning this test can be used) for the duration of the COVID-19 declaration under Section 564(b)(1) of the Act, 21 U.S.C.section 360bbb-3(b)(1), unless the authorization is terminated  or revoked sooner.       Influenza A by PCR NEGATIVE NEGATIVE Final   Influenza B by PCR NEGATIVE NEGATIVE Final    Comment: (NOTE) The Xpert Xpress SARS-CoV-2/FLU/RSV plus assay is intended as an aid in the diagnosis of influenza from Nasopharyngeal swab specimens and should not be used as a sole basis for treatment. Nasal washings and aspirates are unacceptable for Xpert Xpress SARS-CoV-2/FLU/RSV testing.  Fact Sheet for Patients: EntrepreneurPulse.com.au  Fact Sheet for Healthcare Providers: IncredibleEmployment.be  This test is not yet approved or  cleared by the [place] and has been authorized for detection and/or diagnosis of SARS-CoV-2 by FDA under an Emergency Use Authorization (EUA). This EUA will remain in effect (meaning this test can be used) for the duration of the COVID-19 declaration under Section 564(b)(1) of the Act, 21 U.S.C. section 360bbb-3(b)(1), unless the authorization is terminated or revoked.  Performed at [facility], [address] [place], [place] [address]   Blood culture (single)     Status: None (Preliminary result)   Collection Time: 08/11/22   4:02 PM   Specimen: BLOOD  Result Value Ref Range Status   Specimen Description BLOOD BLOOD RIGHT HAND  Final   Special Requests   Final    BOTTLES DRAWN AEROBIC AND ANAEROBIC Blood Culture adequate volume   Culture   Final    NO GROWTH 2 DAYS Performed at [facility], [address] [place], [place] [address]    Report Status PENDING  Incomplete  Urine Culture     Status: Abnormal   Collection Time: 08/11/22  4:02 PM   Specimen: Urine, Random  Result Value Ref Range Status   Specimen Description   Final    URINE, RANDOM Performed at [facility], [address] [place], [place] [address]    Special Requests   Final    NONE Performed at [facility], [address], [place], [name] [address]    Culture (A)  Final    30,000 COLONIES/mL LACTOBACILLUS SPECIES Standardized susceptibility testing for this organism is not available. Performed at [facility], [place] [address], [place], [place] [address]    Report Status 08/13/2022 FINAL  Final  Blood Culture (routine x 2)     Status: None (Preliminary result)   Collection Time: 08/11/22 10:41 PM   Specimen: BLOOD  Result Value Ref Range Status   Specimen Description BLOOD LEFT ANTECUBITAL  Final   Special Requests   Final    BOTTLES DRAWN AEROBIC AND ANAEROBIC Blood Culture adequate volume   Culture  Setup Time   Final    Organism ID to follow GRAM NEGATIVE RODS ANAEROBIC BOTTLE ONLY CRITICAL RESULT CALLED TO, READ BACK BY AND VERIFIED WITH: NATHAN BELUE AT 2239 ON 08/12/22 BY SS Performed at [facility], [address] [place], [place] [address]    Culture GRAM NEGATIVE RODS  Final   Report Status PENDING  Incomplete  Blood Culture ID Panel (Reflexed)     Status: Abnormal   Collection Time: 08/11/22 10:41 PM  Result Value Ref Range Status   Enterococcus faecalis NOT DETECTED NOT DETECTED Final   Enterococcus Faecium NOT DETECTED NOT DETECTED Final   Listeria monocytogenes  NOT DETECTED NOT DETECTED Final   Staphylococcus species NOT DETECTED NOT DETECTED Final   Staphylococcus aureus (BCID) NOT DETECTED NOT DETECTED Final   Staphylococcus epidermidis NOT DETECTED NOT DETECTED Final   Staphylococcus lugdunensis NOT DETECTED NOT DETECTED Final   Streptococcus species NOT DETECTED NOT DETECTED Final   Streptococcus agalactiae NOT DETECTED NOT DETECTED Final   Streptococcus pneumoniae NOT DETECTED NOT DETECTED Final   Streptococcus pyogenes NOT DETECTED NOT DETECTED Final   A.calcoaceticus-baumannii NOT DETECTED NOT DETECTED Final   Bacteroides fragilis NOT DETECTED NOT DETECTED Final   Enterobacterales DETECTED (A) NOT DETECTED Final    Comment: Enterobacterales represent a large order of gram negative bacteria, not a single organism. Refer to culture for further identification. CRITICAL RESULT CALLED TO, READ BACK BY AND VERIFIED WITH: NATHAN  BELUE AT 2239 ON 08/12/22 BY SS    Enterobacter cloacae complex NOT DETECTED NOT DETECTED Final   Escherichia coli NOT DETECTED NOT DETECTED Final   Klebsiella aerogenes NOT DETECTED NOT DETECTED Final   Klebsiella oxytoca NOT DETECTED NOT DETECTED Final   Klebsiella pneumoniae NOT DETECTED NOT DETECTED Final   Proteus species NOT DETECTED NOT DETECTED Final   Salmonella species NOT DETECTED NOT DETECTED Final   Serratia marcescens NOT DETECTED NOT DETECTED Final   Haemophilus influenzae NOT DETECTED NOT DETECTED Final   Neisseria meningitidis NOT DETECTED NOT DETECTED Final   Pseudomonas aeruginosa NOT DETECTED NOT DETECTED Final   Stenotrophomonas maltophilia NOT DETECTED NOT DETECTED Final   Candida albicans NOT DETECTED NOT DETECTED Final   Candida auris NOT DETECTED NOT DETECTED Final   Candida glabrata NOT DETECTED NOT DETECTED Final   Candida krusei NOT DETECTED NOT DETECTED Final   Candida parapsilosis NOT DETECTED NOT DETECTED Final   Candida tropicalis NOT DETECTED NOT DETECTED Final   Cryptococcus  neoformans/gattii NOT DETECTED NOT DETECTED Final   CTX-M ESBL NOT DETECTED NOT DETECTED Final   Carbapenem resistance IMP NOT DETECTED NOT DETECTED Final   Carbapenem resistance KPC NOT DETECTED NOT DETECTED Final   Carbapenem resistance NDM NOT DETECTED NOT DETECTED Final   Carbapenem resist OXA 48 LIKE NOT DETECTED NOT DETECTED Final   Carbapenem resistance VIM NOT DETECTED NOT DETECTED Final    Comment: Performed at [facility], [address] [place], [place] [address]         Radiology Studies: US Venous Img Lower Bilateral (DVT)  Result Date: 08/12/2022 CLINICAL DATA:  Bilateral lower extremity pain. EXAM: BILATERAL LOWER EXTREMITY VENOUS DOPPLER ULTRASOUND TECHNIQUE: Gray-scale sonography with graded compression, as well as color Doppler and duplex ultrasound were performed to evaluate the lower extremity deep venous systems from the level of the common femoral vein and including the common femoral, femoral, profunda femoral, popliteal and calf veins including the posterior tibial, peroneal and gastrocnemius veins when visible. The superficial great saphenous vein was also interrogated. Spectral Doppler was utilized to evaluate flow at rest and with distal augmentation maneuvers in the common femoral, femoral and popliteal veins. COMPARISON:  July 08, 2022. FINDINGS: RIGHT LOWER EXTREMITY Common Femoral Vein: No evidence of thrombus. Normal compressibility, respiratory phasicity and response to augmentation. Saphenofemoral Junction: No evidence of thrombus. Normal compressibility and flow on color Doppler imaging. Profunda Femoral Vein: No evidence of thrombus. Normal compressibility and flow on color Doppler imaging. Femoral Vein: No evidence of thrombus. Normal compressibility, respiratory phasicity and response to augmentation. Popliteal Vein: No evidence of thrombus. Normal compressibility, respiratory phasicity and response to augmentation. Calf Veins: No evidence of  thrombus. Normal compressibility and flow on color Doppler imaging. Superficial Great Saphenous Vein: No evidence of thrombus. Normal compressibility. Venous Reflux:  None. Other Findings:  None. LEFT LOWER EXTREMITY Common Femoral Vein: No evidence of thrombus. Normal compressibility, respiratory phasicity and response to augmentation. Saphenofemoral Junction: No evidence of thrombus. Normal compressibility and flow on color Doppler imaging. Profunda Femoral Vein: No evidence of thrombus. Normal compressibility and flow on color Doppler imaging. Femoral Vein: No evidence of thrombus. Normal compressibility, respiratory phasicity and response to augmentation. Popliteal Vein: No evidence of thrombus. Normal compressibility, respiratory phasicity and response to augmentation. Calf Veins: No evidence of thrombus. Normal compressibility and flow on color Doppler imaging. Superficial Great Saphenous Vein: No evidence of thrombus. Normal compressibility. Venous Reflux:  None. Other Findings:  None. IMPRESSION: No evidence  of deep venous thrombosis in either lower extremity. Electronically Signed   By: Marijo Conception M.D.   On: 08/12/2022 09:57   MR BRAIN WO CONTRAST  Result Date: 08/12/2022 CLINICAL DATA:  Initial evaluation for syncope/presyncope. EXAM: MRI HEAD WITHOUT CONTRAST TECHNIQUE: Multiplanar, multiecho pulse sequences of the brain and surrounding structures were obtained without intravenous contrast. COMPARISON:  Prior study from 07/08/2022. FINDINGS: Brain: Cerebral volume within normal limits for age. No focal parenchymal signal abnormality. No abnormal foci of restricted diffusion to suggest acute or subacute ischemia. Gray-white matter differentiation well maintained. No encephalomalacia to suggest chronic cortical infarction or other insult. No foci of susceptibility artifact indicative of acute or chronic intracranial blood products. No mass lesion, midline shift or mass effect. Ventricles normal in  size and morphology without hydrocephalus. No extra-axial fluid collection. Pituitary gland and suprasellar region within normal limits. Vascular: Major intracranial vascular flow voids are well maintained. Skull and upper cervical spine: Craniocervical junction within normal limits. Visualized upper cervical spine demonstrates no significant finding. Bone marrow signal intensity within normal limits. No scalp soft tissue abnormality. Sinuses/Orbits: Globes and orbital soft tissues are within normal limits. Scattered mucosal thickening noted about the ethmoidal air cells. Trace left mastoid effusion, of doubtful significance. Other: None. IMPRESSION: Normal brain MRI. No acute intracranial abnormality identified. Electronically Signed   By: Jeannine Boga M.D.   On: 08/12/2022 02:14   CT Angio Chest PE W/Cm &/Or Wo Cm  Result Date: 08/11/2022 CLINICAL DATA:  High probability for PE. Syncope and chest pressure. EXAM: CT ANGIOGRAPHY CHEST WITH CONTRAST TECHNIQUE: Multidetector CT imaging of the chest was performed using the standard protocol during bolus administration of intravenous contrast. Multiplanar CT image reconstructions and MIPs were obtained to evaluate the vascular anatomy. RADIATION DOSE REDUCTION: This exam was performed according to the departmental dose-optimization program which includes automated exposure control, adjustment of the mA and/or kV according to patient size and/or use of iterative reconstruction technique. CONTRAST:  58m OMNIPAQUE IOHEXOL 350 MG/ML SOLN COMPARISON:  CT angiogram chest 07/08/2022 FINDINGS: Cardiovascular: Satisfactory opacification of the pulmonary arteries to the segmental level. No evidence of pulmonary embolism. Normal heart size. No pericardial effusion. Mediastinum/Nodes: No enlarged mediastinal, hilar, or axillary lymph nodes. Thyroid gland, trachea, and esophagus demonstrate no significant findings. Lungs/Pleura: There is a calcified granuloma in the  left upper lobe. The lungs are otherwise clear. There is no pleural effusion or pneumothorax. Upper Abdomen: No acute abnormality. Musculoskeletal: Cervical spinal fusion plate is partially visualized. No acute fractures. Review of the MIP images confirms the above findings. IMPRESSION: No evidence for pulmonary embolism or other acute cardiopulmonary process. Electronically Signed   By: ARonney AstersM.D.   On: 08/11/2022 23:31   DG Chest 2 View  Result Date: 08/11/2022 CLINICAL DATA:  Syncopal episode and chest pain. EXAM: CHEST - 2 VIEW COMPARISON:  July 08, 2022 FINDINGS: Cardiomediastinal contours and hilar structures are normal. Lungs are clear. No pneumothorax. No pleural effusion. On limited assessment no acute skeletal findings. IMPRESSION: No active cardiopulmonary disease. Electronically Signed   By: GZetta BillsM.D.   On: 08/11/2022 16:34        Scheduled Meds:  amphetamine-dextroamphetamine  10 mg Oral BID   DULoxetine  30 mg Oral BID   gabapentin  300 mg Oral TID   heparin  5,000 Units Subcutaneous Q8H   pantoprazole (PROTONIX) IV  40 mg Intravenous Q12H   sodium chloride flush  3 mL Intravenous Q12H   thiamine (VITAMIN  B1) injection  100 mg Intravenous Daily   Continuous Infusions:  ceFEPime (MAXIPIME) IV     lactated ringers 75 mL/hr at 08/12/22 1314     LOS: 1 day     Sidney Ace, MD [facility]   If 7PM-7AM, please contact night-coverage  08/13/2022, 10:45 AM

## 2022-08-13 NOTE — Consult Note (Signed)
NAME: [name]  DOB: 08/16/1966  MRN: [id number]  Date/Time: 08/13/2022 1:30 PM  REQUESTING PROVIDER: Dr.Sreenath Subjective:  REASON FOR CONSULT: bacteremia ? [name] is a 56 y.o. female with a history of Bipolar disorder, thyroid disease was recently in [place] and was treated with for pantoea bacteremia presents with sudden onset dizziness, shakes, chills and syncope. Pt was in [facility] 9/13-9/18 for shakes, fever, cough and sob.she had c/o intermittent symptoms for many months During that hospitalization she had pantoea bacteremia 4/4 on 07/08/22 and had extensive work up including TEE, MRI of cervical, lumbar,thoracic spine, CT abd/pelvis which were N CT chest showed no PE,  it showed b/l borderline mediastinal LN and enlarged pulmonary arteries. Cortisol am was low at 4.6 and repeat 5.5 Cosyntropin test was inadequate.  As repeat blood culture was neg on 07/10/22 she was discharged on 9/18 on PO bactrim to complete the course- She took 1 day of bactrim and stopped as she had vomiting due to another reason She came to [facility] on 07/30/22 as OP and was doing okay- I asked her to restart BActrim. repeated BC on 10/5 and it was negative CBC was okay except for anemia and CMP was okay as well- she said she completed the bactrim No travel 2 dogs at home [place] Lives with her husband who is doing fine No cough or sob Has some GERD like sensation   08/11/22  BP 104/52 !  Temp 99.4 F (37.4 C)  Pulse Rate 87  Resp 16  SpO2 100 %    Latest Reference Range & Units 08/11/22  WBC 4.0 - 10.5 K/uL 12.2 (H)  Hemoglobin 12.0 - 15.0 g/dL 11.3 (L)  HCT 36.0 - 46.0 % 34.7 (L)  Platelets 150 - 400 K/uL 331  Creatinine 0.44 - 1.00 mg/dL 1.00   Procal was high She was started on vanco and cefepime after sending blood culture As the blood culture came positive for enterobacteriaceae I am seeing her  Past Medical History:  Diagnosis Date   Abnormal uterine bleeding     ADHD (attention deficit hyperactivity disorder)    Allergy    Anxiety    Bipolar 1 disorder (HCC)    Bipolar disorder (HCC)    Cancer ([place])    uterine cancer   DDD (degenerative disc disease)    Depression    HSV infection    Hx: UTI (urinary tract infection)    Pertussis    Seizures ([place])    last seizure 12/2014    Thyroid disease    Hysterectomy in feb 2021 Complicated by a large abdominal wall hematoma . Bleeding from inferior epigastric vessels and needed coil  embolization She was again admitted for pelvic fluid and had a drain for a day but culture negative  Past Surgical History:  Procedure Laterality Date   ABDOMINAL HYSTERECTOMY     CERVICAL SPINE SURGERY     x 3 2001,2005,2007 c5-6   CESAREAN SECTION  2003   EMBOLIZATION N/A 12/16/2017   Procedure: EMBOLIZATION;  Surgeon: Algernon Huxley, MD;  Location: [facility];  Service: Cardiovascular;  Laterality: N/A;   ESOPHAGOGASTRODUODENOSCOPY (EGD) WITH PROPOFOL N/A 11/01/2019   Procedure: ESOPHAGOGASTRODUODENOSCOPY (EGD) WITH PROPOFOL;  Surgeon: Robert Bellow, MD;  Location: [facility];  Service: Endoscopy;  Laterality: N/A;  POSITIVE ON 09/29/19 and 10/06/19   HYSTEROSCOPY N/A 10/11/2017   Procedure: HYSTEROSCOPY;  Surgeon: Harlin Heys, MD;  Location: [facility];  Service: Gynecology;  Laterality: N/A;   HYSTEROSCOPY WITH D & C N/A 10/11/2017   Procedure: DILATATION AND CURETTAGE /HYSTEROSCOPY;  Surgeon: Harlin Heys, MD;  Location: [facility];  Service: Gynecology;  Laterality: N/A;   LAPAROSCOPIC BILATERAL SALPINGO OOPHERECTOMY Bilateral 12/15/2017   Procedure: LAPAROSCOPIC BILATERAL SALPINGO OOPHORECTOMY;  Surgeon: Mellody Drown, MD;  Location: [facility];  Service: Gynecology;  Laterality: Bilateral;   LAPAROSCOPIC HYSTERECTOMY Bilateral 12/15/2017   Procedure: HYSTERECTOMY TOTAL LAPAROSCOPIC;  Surgeon: Mellody Drown, MD;  Location: [facility];  Service: Gynecology;  Laterality: Bilateral;    SENTINEL NODE BIOPSY N/A 12/15/2017   Procedure: SENTINEL NODE JIRCVE,LFYBOFB;  Surgeon: Mellody Drown, MD;  Location: [facility];  Service: Gynecology;  Laterality: N/A;   TEE WITHOUT CARDIOVERSION N/A 07/13/2022   Procedure: TRANSESOPHAGEAL ECHOCARDIOGRAM (TEE);  Surgeon: Minna Merritts, MD;  Location: [facility];  Service: Cardiovascular;  Laterality: N/A;   TONSILLECTOMY     TUBAL LIGATION      Social History   Socioeconomic History   Marital status: Married    Spouse name: Not on file   Number of children: 4   Years of education: Not on file   Highest education level: Not on file  Occupational History   Not on file  Tobacco Use   Smoking status: Never   Smokeless tobacco: Never  Vaping Use   Vaping Use: Some days  Substance and Sexual Activity   Alcohol use: Yes    Comment: occassionally    Drug use: Not Currently    Comment: last used in 1990's   Sexual activity: Yes    Birth control/protection: Surgical  Other Topics Concern   Not on file  Social History Narrative   Not on file   Social Determinants of Health   Financial Resource Strain: Not on file  Food Insecurity: Food Insecurity Present (08/12/2022)   Hunger Vital Sign    Worried About Running Out of Food in the Last Year: Never true    Ran Out of Food in the Last Year: Sometimes true  Transportation Needs: No Transportation Needs (08/12/2022)   PRAPARE - Hydrologist (Medical): No    Lack of Transportation (Non-Medical): No  Physical Activity: Not on file  Stress: Not on file  Social Connections: Not on file  Intimate Partner Violence: Not At Risk (08/12/2022)   Humiliation, Afraid, Rape, and Kick questionnaire    Fear of Current or Ex-Partner: No    Emotionally Abused: No    Physically Abused: No    Sexually Abused: No    Family History  Problem Relation Age of Onset   Cervical cancer Mother    Cervical cancer Maternal Grandmother    Polycythemia Maternal Grandmother     Stomach cancer Father    Colon cancer Paternal Grandmother    Cervical cancer Cousin    Cervical cancer Cousin    Diabetes Mellitus II Neg Hx    CAD Neg Hx    Colon polyps Neg Hx    Heart disease Neg Hx    Rectal cancer Neg Hx    Allergies  Allergen Reactions   Naproxen Anaphylaxis   Morphine And Related Itching   I? Current Facility-Administered Medications  Medication Dose Route Frequency Provider Last Rate Last Admin   acetaminophen (TYLENOL) tablet 500-1,000 mg  500-1,000 mg Oral Q6H PRN Ralene Muskrat B, MD   1,000 mg at 08/13/22 0814   amphetamine-dextroamphetamine (ADDERALL) tablet 10 mg  10 mg Oral BID Sidney Ace, MD  10 mg at 08/13/22 0808   ceFEPIme (MAXIPIME) 2 g in sodium chloride 0.9 % 100 mL IVPB  2 g Intravenous Q8H Coulter, Carolyn, RPH       cyclobenzaprine (FLEXERIL) tablet 10 mg  10 mg Oral TID PRN Ralene Muskrat B, MD   10 mg at 08/13/22 0814   DULoxetine (CYMBALTA) DR capsule 30 mg  30 mg Oral BID Ralene Muskrat B, MD   30 mg at 08/13/22 3536   gabapentin (NEURONTIN) capsule 300 mg  300 mg Oral TID Ralene Muskrat B, MD   300 mg at 08/13/22 0808   heparin injection 5,000 Units  5,000 Units Subcutaneous Q8H Para Skeans, MD   5,000 Units at 08/13/22 0518   lactated ringers infusion   Intravenous Continuous Foust, Katy L, NP 75 mL/hr at 08/12/22 1314 New Bag at 08/12/22 1314   oxyCODONE (Oxy IR/ROXICODONE) immediate release tablet 10 mg  10 mg Oral QID PRN Ralene Muskrat B, MD   10 mg at 08/13/22 1043   pantoprazole (PROTONIX) injection 40 mg  40 mg Intravenous Q12H Para Skeans, MD   40 mg at 08/13/22 1443   sodium chloride flush (NS) 0.9 % injection 3 mL  3 mL Intravenous Q12H Para Skeans, MD   3 mL at 08/13/22 0815   thiamine (VITAMIN B1) injection 100 mg  100 mg Intravenous Daily Para Skeans, MD   100 mg at 08/13/22 0808     Abtx:  Anti-infectives (From admission, onward)    Start     Dose/Rate Route Frequency Ordered Stop    08/13/22 1400  ceFEPIme (MAXIPIME) 2 g in sodium chloride 0.9 % 100 mL IVPB        2 g 200 mL/hr over 30 Minutes Intravenous Every 8 hours 08/13/22 1020     08/12/22 1700  vancomycin (VANCOCIN) IVPB 1000 mg/200 mL premix  Status:  Discontinued        1,000 mg 200 mL/hr over 60 Minutes Intravenous Every 24 hours 08/12/22 1603 08/13/22 1016   08/12/22 1000  ceFEPIme (MAXIPIME) 2 g in sodium chloride 0.9 % 100 mL IVPB  Status:  Discontinued        2 g 200 mL/hr over 30 Minutes Intravenous Every 12 hours 08/12/22 0846 08/13/22 1020   08/12/22 0900  ceFEPIme (MAXIPIME) 2 g in sodium chloride 0.9 % 100 mL IVPB  Status:  Discontinued        2 g 200 mL/hr over 30 Minutes Intravenous Every 8 hours 08/12/22 0845 08/12/22 0846   08/11/22 2300  vancomycin (VANCOREADY) IVPB 1250 mg/250 mL        1,250 mg 166.7 mL/hr over 90 Minutes Intravenous  Once 08/11/22 2256 08/12/22 0108   08/11/22 2245  ceFEPIme (MAXIPIME) 2 g in sodium chloride 0.9 % 100 mL IVPB        2 g 200 mL/hr over 30 Minutes Intravenous  Once 08/11/22 2242 08/11/22 2316       REVIEW OF SYSTEMS:  Const: negative fever, ++ chills, negative weight loss Eyes: negative diplopia or visual changes, negative eye pain ENT: negative coryza, negative sore throat Resp: negative cough, hemoptysis, dyspnea Cards: negative for chest pain, palpitations, lower extremity edema GU: negative for frequency, dysuria and hematuria GI: Negative for abdominal pain, diarrhea, bleeding, constipation Skin: negative for rash and pruritus Heme: negative for easy bruising and gum/nose bleeding MS: weakness Neurolo: dizziness, syncope Psych:  anxiety, depression  Endocrine:  thyroid,  Allergy/Immunology- as above Objective:  VITALS:  BP (!) 98/55 (BP Location: Right Arm)   Pulse 67   Temp 97.9 F (36.6 C)   Resp 20   Ht '5\' 1"'$  (1.549 m)   Wt 60.7 kg   LMP 09/22/2017 (Exact Date)   SpO2 100%   BMI 25.28 kg/m   PHYSICAL EXAM:  General: Alert,  cooperative, no distress, appears stated age.  Head: Normocephalic, without obvious abnormality, atraumatic. Eyes: Conjunctivae clear, anicteric sclerae. Pupils are equal ENT Nares normal. No drainage or sinus tenderness. Lips, mucosa, and tongue normal. No Thrush Neck: Supple, symmetrical, no adenopathy, thyroid: non tender no carotid bruit and no JVD. Back: No CVA tenderness. Lungs: Clear to auscultation bilaterally. No Wheezing or Rhonchi. No rales. Heart: Regular rate and rhythm, no murmur, rub or gallop. Abdomen: Soft, non-tender,not distended. Bowel sounds normal. No masses Extremities: atraumatic, no cyanosis. No edema. No clubbing Skin: no excoriations on her face- all healed Lymph: Cervical, supraclavicular normal. Neurologic: Grossly non-focal Pertinent Labs Lab Results CBC    Component Value Date/Time   WBC 5.4 08/13/2022 0445   RBC 3.19 (L) 08/13/2022 0445   HGB 9.4 (L) 08/13/2022 0445   HCT 28.5 (L) 08/13/2022 0445   PLT 149 (L) 08/13/2022 0445   MCV 89.3 08/13/2022 0445   MCH 29.5 08/13/2022 0445   MCHC 33.0 08/13/2022 0445   RDW 14.6 08/13/2022 0445   LYMPHSABS 2.4 08/13/2022 0445   MONOABS 0.4 08/13/2022 0445   EOSABS 0.3 08/13/2022 0445   BASOSABS 0.0 08/13/2022 0445       Latest Ref Rng & Units 08/13/2022    4:45 AM 08/12/2022    2:10 AM 08/11/2022    4:02 PM  CMP  Glucose 70 - 99 mg/dL 95   96   BUN 6 - 20 mg/dL 14   16   Creatinine 0.44 - 1.00 mg/dL 0.82   1.00   Sodium 135 - 145 mmol/L 139   136   Potassium 3.5 - 5.1 mmol/L 4.2   3.9   Chloride 98 - 111 mmol/L 109   109   CO2 22 - 32 mmol/L 24   18   Calcium 8.9 - 10.3 mg/dL 8.7   8.9   Total Protein 6.5 - 8.1 g/dL  6.4    Total Bilirubin 0.3 - 1.2 mg/dL  0.9    Alkaline Phos 38 - 126 U/L  62    AST 15 - 41 U/L  26    ALT 0 - 44 U/L  15        Microbiology: Recent Results (from the past 240 hour(s))  Resp Panel by RT-PCR (Flu A&B, Covid) Anterior Nasal Swab     Status: None    Collection Time: 08/11/22  4:01 PM   Specimen: Anterior Nasal Swab  Result Value Ref Range Status   SARS Coronavirus 2 by RT PCR NEGATIVE NEGATIVE Final    Comment: (NOTE) SARS-CoV-2 target nucleic acids are NOT DETECTED.  The SARS-CoV-2 RNA is generally detectable in upper respiratory specimens during the acute phase of infection. The lowest concentration of SARS-CoV-2 viral copies this assay can detect is 138 copies/mL. A negative result does not preclude SARS-Cov-2 infection and should not be used as the sole basis for treatment or other patient management decisions. A negative result may occur with  improper specimen collection/handling, submission of specimen other than nasopharyngeal swab, presence of viral mutation(s) within the areas targeted by this assay, and inadequate number of viral copies(<138 copies/mL). A negative result must be  combined with clinical observations, patient history, and epidemiological information. The expected result is Negative.  Fact Sheet for Patients:  EntrepreneurPulse.com.au  Fact Sheet for Healthcare Providers:  IncredibleEmployment.be  This test is no t yet approved or cleared by the [place] FDA and  has been authorized for detection and/or diagnosis of SARS-CoV-2 by FDA under an Emergency Use Authorization (EUA). This EUA will remain  in effect (meaning this test can be used) for the duration of the COVID-19 declaration under Section 564(b)(1) of the Act, 21 U.S.C.section 360bbb-3(b)(1), unless the authorization is terminated  or revoked sooner.       Influenza A by PCR NEGATIVE NEGATIVE Final   Influenza B by PCR NEGATIVE NEGATIVE Final    Comment: (NOTE) The Xpert Xpress SARS-CoV-2/FLU/RSV plus assay is intended as an aid in the diagnosis of influenza from Nasopharyngeal swab specimens and should not be used as a sole basis for treatment. Nasal washings and aspirates are unacceptable for  Xpert Xpress SARS-CoV-2/FLU/RSV testing.  Fact Sheet for Patients: EntrepreneurPulse.com.au  Fact Sheet for Healthcare Providers: IncredibleEmployment.be  This test is not yet approved or cleared by the [place] FDA and has been authorized for detection and/or diagnosis of SARS-CoV-2 by FDA under an Emergency Use Authorization (EUA). This EUA will remain in effect (meaning this test can be used) for the duration of the COVID-19 declaration under Section 564(b)(1) of the Act, 21 U.S.C. section 360bbb-3(b)(1), unless the authorization is terminated or revoked.  Performed at [name], [address] [place], [place] [address]   Blood culture (single)     Status: None (Preliminary result)   Collection Time: 08/11/22  4:02 PM   Specimen: BLOOD  Result Value Ref Range Status   Specimen Description BLOOD BLOOD RIGHT HAND  Final   Special Requests   Final    BOTTLES DRAWN AEROBIC AND ANAEROBIC Blood Culture adequate volume   Culture   Final    NO GROWTH 2 DAYS Performed at [facility], [address] [place], [place] [address]    Report Status PENDING  Incomplete  Urine Culture     Status: Abnormal   Collection Time: 08/11/22  4:02 PM   Specimen: Urine, Random  Result Value Ref Range Status   Specimen Description   Final    URINE, RANDOM Performed at [facility], [address], [place], [place] [address]    Special Requests   Final    NONE Performed at [facility], [address] [place], [place] [address]    Culture (A)  Final    30,000 COLONIES/mL LACTOBACILLUS SPECIES Standardized susceptibility testing for this organism is not available. Performed at [facility], [place] [address] [place], [place] [address]    Report Status 08/13/2022 FINAL  Final  Blood Culture (routine x 2)     Status: None (Preliminary result)   Collection Time: 08/11/22 10:41 PM   Specimen: BLOOD   Result Value Ref Range Status   Specimen Description BLOOD LEFT ANTECUBITAL  Final   Special Requests   Final    BOTTLES DRAWN AEROBIC AND ANAEROBIC Blood Culture adequate volume   Culture  Setup Time   Final    Organism ID to follow GRAM NEGATIVE RODS ANAEROBIC BOTTLE ONLY CRITICAL RESULT CALLED TO, READ BACK BY AND VERIFIED WITH: NATHAN BELUE AT 2239 ON 08/12/22 BY SS Performed at [facility], [address], [place], [place] [address]    [place]  Final  Report Status PENDING  Incomplete  Blood Culture ID Panel (Reflexed)     Status: Abnormal   Collection Time: 08/11/22 10:41 PM  Result Value Ref Range Status   Enterococcus faecalis NOT DETECTED NOT DETECTED Final   Enterococcus Faecium NOT DETECTED NOT DETECTED Final   Listeria monocytogenes NOT DETECTED NOT DETECTED Final   Staphylococcus species NOT DETECTED NOT DETECTED Final   Staphylococcus aureus (BCID) NOT DETECTED NOT DETECTED Final   Staphylococcus epidermidis NOT DETECTED NOT DETECTED Final   Staphylococcus lugdunensis NOT DETECTED NOT DETECTED Final   Streptococcus species NOT DETECTED NOT DETECTED Final   Streptococcus agalactiae NOT DETECTED NOT DETECTED Final   Streptococcus pneumoniae NOT DETECTED NOT DETECTED Final   Streptococcus pyogenes NOT DETECTED NOT DETECTED Final   A.calcoaceticus-baumannii NOT DETECTED NOT DETECTED Final   Bacteroides fragilis NOT DETECTED NOT DETECTED Final   Enterobacterales DETECTED (A) NOT DETECTED Final    Comment: Enterobacterales represent a large order of gram negative bacteria, not a single organism. Refer to culture for further identification. CRITICAL RESULT CALLED TO, READ BACK BY AND VERIFIED WITH: NATHAN BELUE AT 2239 ON 08/12/22 BY SS    Enterobacter cloacae complex NOT DETECTED NOT DETECTED Final   Escherichia coli NOT DETECTED NOT DETECTED Final   Klebsiella aerogenes NOT DETECTED NOT DETECTED Final   Klebsiella oxytoca NOT DETECTED  NOT DETECTED Final   Klebsiella pneumoniae NOT DETECTED NOT DETECTED Final   Proteus species NOT DETECTED NOT DETECTED Final   Salmonella species NOT DETECTED NOT DETECTED Final   Serratia marcescens NOT DETECTED NOT DETECTED Final   Haemophilus influenzae NOT DETECTED NOT DETECTED Final   Neisseria meningitidis NOT DETECTED NOT DETECTED Final   Pseudomonas aeruginosa NOT DETECTED NOT DETECTED Final   Stenotrophomonas maltophilia NOT DETECTED NOT DETECTED Final   Candida albicans NOT DETECTED NOT DETECTED Final   Candida auris NOT DETECTED NOT DETECTED Final   Candida glabrata NOT DETECTED NOT DETECTED Final   Candida krusei NOT DETECTED NOT DETECTED Final   Candida parapsilosis NOT DETECTED NOT DETECTED Final   Candida tropicalis NOT DETECTED NOT DETECTED Final   Cryptococcus neoformans/gattii NOT DETECTED NOT DETECTED Final   CTX-M ESBL NOT DETECTED NOT DETECTED Final   Carbapenem resistance IMP NOT DETECTED NOT DETECTED Final   Carbapenem resistance KPC NOT DETECTED NOT DETECTED Final   Carbapenem resistance NDM NOT DETECTED NOT DETECTED Final   Carbapenem resist OXA 48 LIKE NOT DETECTED NOT DETECTED Final   Carbapenem resistance VIM NOT DETECTED NOT DETECTED Final    Comment: Performed at [facility], [address] [place], [place] [address]    IMAGING RESULTS: CTA chest N MRI brain N  I have personally reviewed the films ? Impression/Recommendation syncope  Chills High procal Recurrent Enterobacteriaceae bacteremia Previoulsy pantoea bacteremia in Sept 2023 with extensive work up ( TEE, MRI entire spine, CT abd/pelvis/chest/brain) were all negative  Unclear cause for the bacteremia Will need to do workup to see for any source or deep infection- A PET scan may be helpful  Anemia Low cortisol- recommend endocrine consult ? ?past h/o hysterectomy complicated by ant abd wall hematoma with bleeding from inferior epigastric vessels needing coil embolization in  feb 2021 ___________________________________________________ Discussed with patient, requesting provider Note:  This document was prepared using Dragon voice recognition software and may include unintentional dictation errors.

## 2022-08-13 NOTE — Progress Notes (Signed)
Hospitalist notified of FSBS 57 mg/dL. Pt given juice at this time. Pt asymptomatic.

## 2022-08-13 NOTE — Progress Notes (Signed)
Mobility Specialist - Progress Note   08/13/22 1048  Mobility  Activity Ambulated with assistance in hallway  Level of Assistance Standby assist, set-up cues, supervision of patient - no hands on  Assistive Device Other (Comment) (IV Pole)  Distance Ambulated (ft) 160 ft  Activity Response Tolerated well  $Mobility charge 1 Mobility   Pt supine in bed on RA upon arrival. Pt STS and ambulates 1 lap around NS Supervision. Pt has 1 LOB corrected by themselves. Pt denies dizziness. Pt returns to bed with needs in reach.   [name]  Mobility Specialist  08/13/22 10:50 AM

## 2022-08-13 NOTE — Consult Note (Addendum)
Pharmacy Antibiotic Note  [name] is a 56 y.o. female admitted on 08/11/2022 with sepsis. Presented after a syncopal episode with lightheadedness, tremors, chills, weakness/fatigue, transient chest pressure and SOB. She was discharged from [facility] on 07[facility]13/2022 following treatment for Pantoea bacteremia (~14 day tx with cefepime >> ceftriaxone >> bactrim). Questionable compliance to bactrim outpatient as patient reported GI upset after 2 doses. Patient was encouraged to resume bactrim at outpatient follow-up with ID physician. Pharmacy has been consulted for Cefepime dosing.  Plan: Day 2 of antibiotics Discontinue Vancomycin per discussion with ID physician Increase Cefepime from 2 g IV Q12H to 2g IV Q8H given CrCl >60 mL/min Continue to monitor renal function and follow culture results   Height: '5\' 1"'$  (154.9 cm) Weight: 60.7 kg (133 lb 13.1 oz) IBW/kg (Calculated) : 47.8  Temp (24hrs), Avg:98.1 F (36.7 C), Min:97.8 F (36.6 C), Max:98.7 F (37.1 C)  Recent Labs  Lab 08/11/22 1602 08/11/22 2336 08/13/22 0445  WBC 12.2*  --  5.4  CREATININE 1.00  --  0.82  LATICACIDVEN 2.6* 1.5  --      Estimated Creatinine Clearance: 63.3 mL/min (by C-G formula based on SCr of 0.82 mg/dL).    Allergies  Allergen Reactions   Naproxen Anaphylaxis   Morphine And Related Itching    Antimicrobials this admission: 10/17 Vancomycin >> 10/18 10/17 Cefepime >>   Microbiology results: 10/17 BCx: 1 of 4 (anaerobic) growing GNR, BCID Enterobacterales 10/17 UCx: 30k Lactobacillus spp.  Thank you for allowing pharmacy to be a part of this patient's care.  Gretel Acre, PharmD PGY1 Pharmacy Resident 08/13/2022 9:18 AM

## 2022-08-13 NOTE — Progress Notes (Signed)
Mobility Specialist - Progress Note   08/13/22 1416  Mobility  Activity Ambulated independently in hallway  Level of Assistance Independent  Assistive Device  (IV Pole)  Distance Ambulated (ft) 160 ft  Activity Response Tolerated well  $Mobility charge 1 Mobility   Pt supine in bed on RA upon arrival. Pt STS and ambulates 1 lap around NS Indep. Pt returns to bed with needs in reach.   Gretchen Short  Mobility Specialist  08/13/22 2:18 PM

## 2022-08-14 ENCOUNTER — Inpatient Hospital Stay: Payer: Medicare Other

## 2022-08-14 ENCOUNTER — Encounter: Payer: Self-pay | Admitting: Internal Medicine

## 2022-08-14 DIAGNOSIS — R55 Syncope and collapse: Secondary | ICD-10-CM | POA: Diagnosis not present

## 2022-08-14 DIAGNOSIS — R7881 Bacteremia: Secondary | ICD-10-CM | POA: Diagnosis not present

## 2022-08-14 LAB — BASIC METABOLIC PANEL
Anion gap: 8 (ref 5–15)
BUN: 15 mg/dL (ref 6–20)
CO2: 28 mmol/L (ref 22–32)
Calcium: 8.7 mg/dL — ABNORMAL LOW (ref 8.9–10.3)
Chloride: 103 mmol/L (ref 98–111)
Creatinine, Ser: 0.9 mg/dL (ref 0.44–1.00)
GFR, Estimated: >60 mL/min (ref >60)
Glucose, Bld: 86 mg/dL (ref 70–99)
Potassium: 3.8 mmol/L (ref 3.5–5.1)
Sodium: 139 mmol/L (ref 135–145)

## 2022-08-14 LAB — URINE DRUG SCREEN, QUALITATIVE (ARMC ONLY)
Amphetamines, Ur Screen: POSITIVE — AB
Barbiturates, Ur Screen: NOT DETECTED
Benzodiazepine, Ur Scrn: NOT DETECTED
Cannabinoid 50 Ng, Ur ~~LOC~~: NOT DETECTED
Cocaine Metabolite,Ur ~~LOC~~: NOT DETECTED
MDMA (Ecstasy)Ur Screen: NOT DETECTED
Methadone Scn, Ur: NOT DETECTED
Opiate, Ur Screen: NOT DETECTED
Phencyclidine (PCP) Ur S: NOT DETECTED
Tricyclic, Ur Screen: NOT DETECTED

## 2022-08-14 LAB — GLUCOSE, CAPILLARY: Glucose-Capillary: 118 mg/dL — ABNORMAL HIGH (ref 70–99)

## 2022-08-14 LAB — PROCALCITONIN: Procalcitonin: 5.27 ng/mL

## 2022-08-14 MED ORDER — IOHEXOL 300 MG/ML  SOLN
75.0000 mL | Freq: Once | INTRAMUSCULAR | Status: AC | PRN
  Administered 2022-08-14: 75 mL via INTRAVENOUS

## 2022-08-14 NOTE — Consult Note (Signed)
Pharmacy Antibiotic Note  [name] is a 56 y.o. female admitted on 08/11/2022 with sepsis. Presented after a syncopal episode with lightheadedness, tremors, chills, weakness/fatigue, transient chest pressure and SOB. She was discharged from [facility] on 07/13/2022 following treatment for Pantoea bacteremia (~14 day tx with cefepime >> ceftriaxone >> bactrim). Questionable compliance to bactrim outpatient as patient reported GI upset after 2 doses. Patient was encouraged to resume bactrim at outpatient follow-up with ID physician. Pharmacy has been consulted for Cefepime dosing.  Plan: Day 3 of antibiotics Continue Cefepime from 2g IV Q8H Continue to monitor renal function and follow culture results  Height: '5\' 1"'$  (154.9 cm) Weight: 60.7 kg (133 lb 13.1 oz) IBW/kg (Calculated) : 47.8  Temp (24hrs), Avg:97.9 F (36.6 C), Min:97.6 F (36.4 C), Max:98.3 F (36.8 C)  Recent Labs  Lab 08/11/22 1602 08/11/22 2336 08/13/22 0445 08/14/22 0504  WBC 12.2*  --  5.4  --   CREATININE 1.00  --  0.82 0.90  LATICACIDVEN 2.6* 1.5  --   --      Estimated Creatinine Clearance: 57.7 mL/min (by C-G formula based on SCr of 0.9 mg/dL).    Allergies  Allergen Reactions   Naproxen Anaphylaxis   Morphine And Related Itching    Antimicrobials this admission: 10/17 Vancomycin >> 10/18 10/17 Cefepime >>   Microbiology results: 10/17 BCx: 1 of 4 (anaerobic) growing GNR, BCID Enterobacterales 10/17 UCx: 30k Lactobacillus spp.  Thank you for allowing pharmacy to be a part of this patient's care.  Gretel Acre, PharmD PGY1 Pharmacy Resident 08/14/2022 9:36 AM

## 2022-08-14 NOTE — Plan of Care (Signed)

## 2022-08-14 NOTE — Progress Notes (Signed)
   Date of Admission:  08/11/2022     ID: [name] is a 56 y.o. female Principal Problem:   Syncope Active Problems:   Alcoholism ([place])   Seizure ([place])   Hypotension   Sepsis ([place])   AKI (acute kidney injury) ([place])    Subjective: Pt has no specific complaints I see three new excoriation on the face- she says she picked at it because it was itchy  Medications:   amphetamine-dextroamphetamine  10 mg Oral BID   DULoxetine  30 mg Oral BID   gabapentin  300 mg Oral TID   heparin  5,000 Units Subcutaneous Q8H   pantoprazole (PROTONIX) IV  40 mg Intravenous Q12H   sodium chloride flush  3 mL Intravenous Q12H   thiamine (VITAMIN B1) injection  100 mg Intravenous Daily    Objective: Vital signs in last 24 hours: Temp:  [97.6 F (36.4 C)-98.3 F (36.8 C)] 97.9 F (36.6 C) (10/20 0500) Pulse Rate:  [67-74] 74 (10/20 0500) Resp:  [20] 20 (10/19 1547) BP: (98-133)/(55-82) 133/82 (10/20 0500) SpO2:  [96 %-100 %] 98 % (10/20 0500) Weight:  [60.7 kg] 60.7 kg (10/20 0500)    PHYSICAL EXAM:  General: Alert, cooperative, no distress, appears stated age.  Face new excoriations- in the philtrum area, jawline Lungs: Clear to auscultation bilaterally. No Wheezing or Rhonchi. No rales. Heart: Regular rate and rhythm, no murmur, rub or gallop. Abdomen: Soft, non-tender,not distended. Bowel sounds normal. No masses Extremities: atraumatic, no cyanosis. No edema. No clubbing Skin: No rashes or lesions. Or bruising Lymph: Cervical, supraclavicular normal. Neurologic: Grossly non-focal  Lab Results Recent Labs    08/11/22 1602 08/13/22 0445 08/14/22 0504  WBC 12.2* 5.4  --   HGB 11.3* 9.4*  --   HCT 34.7* 28.5*  --   NA 136 139 139  K 3.9 4.2 3.8  CL 109 109 103  CO2 18* 24 28  BUN '16 14 15  '$ CREATININE 1.00 0.82 0.90   Liver Panel Recent Labs    08/12/22 0210  PROT 6.4*  ALBUMIN 3.1*  AST 26  ALT 15  ALKPHOS 62  BILITOT 0.9  BILIDIR 0.3*  IBILI 0.6    Sedimentation Rate No results for input(s): "ESRSEDRATE" in the last 72 hours. C-Reactive Protein No results for input(s): "CRP" in the last 72 hours.  Microbiology:  Studies/Results: No results found.   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 ?past h/o hysterectomy complicated by ant abd wall hematoma with bleeding from inferior epigastric vessels needing coil embolization in feb 2021  Discussed the mnagement with

## 2022-08-14 NOTE — Progress Notes (Signed)
PROGRESS NOTE    [name]  WCB:[id number] DOB: [birth year] DOA: 08/11/2022 PCP: Sandi Mariscal, MD    Brief Narrative:  56 year old female who presents for evaluation after syncopal event.  Unclear etiology of syncope.  Orthostatics negative.  At this time unable to exclude infection.  Patient was previously on long-term IV antibiotics for bacteremia.  Source at that time was unclear.  Procalcitonin markedly elevated.  Urinalysis not indicative of infection, chest x-ray negative.  Blood cultures taken on admission    Now growing Enterobacter species.  Vancomycin discontinued.  Cefepime continued.  Infectious disease consulted.  Recommendations appreciated.     Assessment & Plan:   Principal Problem:   Syncope Active Problems:   Sepsis ([place])   AKI (acute kidney injury) ([place])   Alcoholism ([place])   Seizure ([place])   Hypotension  Syncope No recurrent episodes.  Home Cymbalta and Flexeril and pain regimen have been held.  Orthostatics negative. Plan: Therapy as tolerated Fall precautions Okay for home regimen Discontinue IVF   Sepsis Gram negative bacteremia Patient presented with markedly elevated procalcitonin.  No clear source of infection.  Patient has a recent long-term antibiotic course for bacteremia of unclear etiology.  Current blood cultures positive for Enterobacter.  Lactobacillus growing in the urine, questionable contaminant. Plan: Continue cefepime DC IVF Check CT max face and CT soft tissue neck rule out submandibular abscess versus dental caries Infectious disease consult   Acute kidney injury Resolved.  Creatinine back to baseline   History of seizure disorder No meds on admission Outpatient follow-up   Alcohol use disorder Daily thiamine  DVT prophylaxis: SQ heparin Code Status: Full Family Communication: None today Disposition Plan: Status is: Inpatient Remains inpatient appropriate because: Bacteremia of unclear etiology on IV  antibiotics   Level of care: Med-Surg  Consultants:  ID  Procedures:  None  Antimicrobials: Cefepime   Subjective: Seen and examined.  Resting on family in bed.  No visible distress.  No complaints of pain.  No recurrence of syncopal event.  Objective: Vitals:   08/13/22 1547 08/13/22 1954 08/14/22 0500 08/14/22 0500  BP: 118/72 118/76  133/82  Pulse: 74 73  74  Resp: 20     Temp: 98.3 F (36.8 C) 97.6 F (36.4 C)  97.9 F (36.6 C)  TempSrc:  Oral  Oral  SpO2: 96% 98%  98%  Weight:   60.7 kg   Height:        Intake/Output Summary (Last 24 hours) at 08/14/2022 1034 Last data filed at 08/14/2022 0930 Gross per 24 hour  Intake 595.91 ml  Output --  Net 595.91 ml   Filed Weights   08/12/22 0156 08/13/22 0500 08/14/22 0500  Weight: 62 kg 60.7 kg 60.7 kg    Examination:  General exam: NAD.  Poor dentition Respiratory system: Clear to auscultation. Respiratory effort normal. Cardiovascular system: S1-S2, RRR, no murmurs, no pedal edema Gastrointestinal system: Thin, soft, NT/ND, normal bowel sounds Central nervous system: Alert and oriented. No focal neurological deficits. Extremities: Symmetric 5 x 5 power. Skin: No rashes, lesions or ulcers Psychiatry: Judgement and insight appear normal. Mood & affect appropriate.     Data Reviewed: I have personally reviewed following labs and imaging studies  CBC: Recent Labs  Lab 08/11/22 1602 08/13/22 0445  WBC 12.2* 5.4  NEUTROABS  --  2.3  HGB 11.3* 9.4*  HCT 34.7* 28.5*  MCV 88.1 89.3  PLT 331 616*   Basic Metabolic Panel: Recent Labs  Lab 08/11/22 1602  08/13/22 0445 08/14/22 0504  NA 136 139 139  K 3.9 4.2 3.8  CL 109 109 103  CO2 18* 24 28  GLUCOSE 96 95 86  BUN '16 14 15  '$ CREATININE 1.00 0.82 0.90  CALCIUM 8.9 8.7* 8.7*   GFR: Estimated Creatinine Clearance: 57.7 mL/min (by C-G formula based on SCr of 0.9 mg/dL). Liver Function Tests: Recent Labs  Lab 08/12/22 0210  AST 26  ALT 15   ALKPHOS 62  BILITOT 0.9  PROT 6.4*  ALBUMIN 3.1*   No results for input(s): "LIPASE", "AMYLASE" in the last 168 hours. No results for input(s): "AMMONIA" in the last 168 hours. Coagulation Profile: No results for input(s): "INR", "PROTIME" in the last 168 hours. Cardiac Enzymes: Recent Labs  Lab 08/12/22 0210  CKTOTAL 45   BNP (last 3 results) No results for input(s): "PROBNP" in the last 8760 hours. HbA1C: No results for input(s): "HGBA1C" in the last 72 hours. CBG: Recent Labs  Lab 08/13/22 0546 08/13/22 0753 08/13/22 1210 08/13/22 1543 08/14/22 0456  GLUCAP 99 103* 109* 81 118*   Lipid Profile: No results for input(s): "CHOL", "HDL", "LDLCALC", "TRIG", "CHOLHDL", "LDLDIRECT" in the last 72 hours. Thyroid Function Tests: No results for input(s): "TSH", "T4TOTAL", "FREET4", "T3FREE", "THYROIDAB" in the last 72 hours. Anemia Panel: No results for input(s): "VITAMINB12", "FOLATE", "FERRITIN", "TIBC", "IRON", "RETICCTPCT" in the last 72 hours. Sepsis Labs: Recent Labs  Lab 08/11/22 1602 08/11/22 2336 08/12/22 0210 08/13/22 0445 08/14/22 0504  PROCALCITON  --   --  15.48 8.33 5.27  LATICACIDVEN 2.6* 1.5  --   --   --     Recent Results (from the past 240 hour(s))  Resp Panel by RT-PCR (Flu A&B, Covid) Anterior Nasal Swab     Status: None   Collection Time: 08/11/22  4:01 PM   Specimen: Anterior Nasal Swab  Result Value Ref Range Status   SARS Coronavirus 2 by RT PCR NEGATIVE NEGATIVE Final    Comment: (NOTE) SARS-CoV-2 target nucleic acids are NOT DETECTED.  The SARS-CoV-2 RNA is generally detectable in upper respiratory specimens during the acute phase of infection. The lowest concentration of SARS-CoV-2 viral copies this assay can detect is 138 copies/mL. A negative result does not preclude SARS-Cov-2 infection and should not be used as the sole basis for treatment or other patient management decisions. A negative result may occur with  improper specimen  collection/handling, submission of specimen other than nasopharyngeal swab, presence of viral mutation(s) within the areas targeted by this assay, and inadequate number of viral copies(<138 copies/mL). A negative result must be combined with clinical observations, patient history, and epidemiological information. The expected result is Negative.  Fact Sheet for Patients:  EntrepreneurPulse.com.au  Fact Sheet for Healthcare Providers:  IncredibleEmployment.be  This test is no t yet approved or cleared by the [place] FDA and  has been authorized for detection and/or diagnosis of SARS-CoV-2 by FDA under an Emergency Use Authorization (EUA). This EUA will remain  in effect (meaning this test can be used) for the duration of the COVID-19 declaration under Section 564(b)(1) of the Act, 21 U.S.C.section 360bbb-3(b)(1), unless the authorization is terminated  or revoked sooner.       Influenza A by PCR NEGATIVE NEGATIVE Final   Influenza B by PCR NEGATIVE NEGATIVE Final    Comment: (NOTE) The Xpert Xpress SARS-CoV-2/FLU/RSV plus assay is intended as an aid in the diagnosis of influenza from Nasopharyngeal swab specimens and should not be used as a sole  basis for treatment. Nasal washings and aspirates are unacceptable for Xpert Xpress SARS-CoV-2/FLU/RSV testing.  Fact Sheet for Patients: EntrepreneurPulse.com.au  Fact Sheet for Healthcare Providers: IncredibleEmployment.be  This test is not yet approved or cleared by the [place] FDA and has been authorized for detection and/or diagnosis of SARS-CoV-2 by FDA under an Emergency Use Authorization (EUA). This EUA will remain in effect (meaning this test can be used) for the duration of the COVID-19 declaration under Section 564(b)(1) of the Act, 21 U.S.C. section 360bbb-3(b)(1), unless the authorization is terminated or revoked.  Performed at [facility], [address] [place], [place] [address]   Blood culture (single)     Status: None (Preliminary result)   Collection Time: 08/11/22  4:02 PM   Specimen: BLOOD  Result Value Ref Range Status   Specimen Description BLOOD BLOOD RIGHT HAND  Final   Special Requests   Final    BOTTLES DRAWN AEROBIC AND ANAEROBIC Blood Culture adequate volume   Culture   Final    NO GROWTH 3 DAYS Performed at [facility], [address], [place], [place] [address]    Report Status PENDING  Incomplete  Urine Culture     Status: Abnormal   Collection Time: 08/11/22  4:02 PM   Specimen: Urine, Random  Result Value Ref Range Status   Specimen Description   Final    URINE, RANDOM Performed at [facility], [address] [place], [place] [address]    Special Requests   Final    NONE Performed at [facility], [address], [place], [place] [address]    Culture (A)  Final    30,000 COLONIES/mL LACTOBACILLUS SPECIES Standardized susceptibility testing for this organism is not available. Performed at [facility], [place] [address], [place], [place] [address]    Report Status 08/13/2022 FINAL  Final  Blood Culture (routine x 2)     Status: None (Preliminary result)   Collection Time: 08/11/22 10:41 PM   Specimen: BLOOD  Result Value Ref Range Status   Specimen Description   Final    BLOOD LEFT ANTECUBITAL Performed at [facility], [address], [place], [place] [address]    Special Requests   Final    BOTTLES DRAWN AEROBIC AND ANAEROBIC Blood Culture adequate volume Performed at [facility], [address] [place], [place] [address]    Culture  Setup Time   Final    Organism ID to follow GRAM NEGATIVE RODS ANAEROBIC BOTTLE ONLY CRITICAL RESULT CALLED TO, READ BACK BY AND VERIFIED WITH: NATHAN BELUE AT 2239 ON 08/12/22 BY SS Performed at [facility], [address] [place], [place] [address]    Culture    Final    Lonell Grandchild NEGATIVE RODS IDENTIFICATION AND SUSCEPTIBILITIES TO FOLLOW Performed at [facility], [place] [address], [place], [place] [address]    Report Status PENDING  Incomplete  Blood Culture ID Panel (Reflexed)     Status: Abnormal   Collection Time: 08/11/22 10:41 PM  Result Value Ref Range Status   Enterococcus faecalis NOT DETECTED NOT DETECTED Final   Enterococcus Faecium NOT DETECTED NOT DETECTED Final   Listeria monocytogenes NOT DETECTED NOT DETECTED Final   Staphylococcus species NOT DETECTED NOT DETECTED Final   Staphylococcus aureus (BCID) NOT DETECTED NOT DETECTED Final   Staphylococcus epidermidis NOT DETECTED NOT DETECTED Final   Staphylococcus lugdunensis NOT DETECTED NOT DETECTED Final   Streptococcus species NOT DETECTED NOT  DETECTED Final   Streptococcus agalactiae NOT DETECTED NOT DETECTED Final   Streptococcus pneumoniae NOT DETECTED NOT DETECTED Final   Streptococcus pyogenes NOT DETECTED NOT DETECTED Final   A.calcoaceticus-baumannii NOT DETECTED NOT DETECTED Final   Bacteroides fragilis NOT DETECTED NOT DETECTED Final   Enterobacterales DETECTED (A) NOT DETECTED Final    Comment: Enterobacterales represent a large order of gram negative bacteria, not a single organism. Refer to culture for further identification. CRITICAL RESULT CALLED TO, READ BACK BY AND VERIFIED WITH: NATHAN BELUE AT 2239 ON 08/12/22 BY SS    Enterobacter cloacae complex NOT DETECTED NOT DETECTED Final   Escherichia coli NOT DETECTED NOT DETECTED Final   Klebsiella aerogenes NOT DETECTED NOT DETECTED Final   Klebsiella oxytoca NOT DETECTED NOT DETECTED Final   Klebsiella pneumoniae NOT DETECTED NOT DETECTED Final   Proteus species NOT DETECTED NOT DETECTED Final   Salmonella species NOT DETECTED NOT DETECTED Final   Serratia marcescens NOT DETECTED NOT DETECTED Final   Haemophilus influenzae NOT DETECTED NOT DETECTED Final   Neisseria meningitidis NOT DETECTED NOT DETECTED Final    Pseudomonas aeruginosa NOT DETECTED NOT DETECTED Final   Stenotrophomonas maltophilia NOT DETECTED NOT DETECTED Final   Candida albicans NOT DETECTED NOT DETECTED Final   Candida auris NOT DETECTED NOT DETECTED Final   Candida glabrata NOT DETECTED NOT DETECTED Final   Candida krusei NOT DETECTED NOT DETECTED Final   Candida parapsilosis NOT DETECTED NOT DETECTED Final   Candida tropicalis NOT DETECTED NOT DETECTED Final   Cryptococcus neoformans/gattii NOT DETECTED NOT DETECTED Final   CTX-M ESBL NOT DETECTED NOT DETECTED Final   Carbapenem resistance IMP NOT DETECTED NOT DETECTED Final   Carbapenem resistance KPC NOT DETECTED NOT DETECTED Final   Carbapenem resistance NDM NOT DETECTED NOT DETECTED Final   Carbapenem resist OXA 48 LIKE NOT DETECTED NOT DETECTED Final   Carbapenem resistance VIM NOT DETECTED NOT DETECTED Final    Comment: Performed at [facility], [address], [place], [place] [address]         Radiology Studies: No results found.      Scheduled Meds:  amphetamine-dextroamphetamine  10 mg Oral BID   DULoxetine  30 mg Oral BID   gabapentin  300 mg Oral TID   heparin  5,000 Units Subcutaneous Q8H   pantoprazole (PROTONIX) IV  40 mg Intravenous Q12H   sodium chloride flush  3 mL Intravenous Q12H   thiamine (VITAMIN B1) injection  100 mg Intravenous Daily   Continuous Infusions:  ceFEPime (MAXIPIME) IV 2 g (08/14/22 0539)     LOS: 2 days    Sidney Ace, MD [facility]   If 7PM-7AM, please contact night-coverage  08/14/2022, 10:34 AM

## 2022-08-15 DIAGNOSIS — R55 Syncope and collapse: Secondary | ICD-10-CM | POA: Diagnosis not present

## 2022-08-15 LAB — GLUCOSE, CAPILLARY
Glucose-Capillary: 82 mg/dL (ref 70–99)
Glucose-Capillary: 93 mg/dL (ref 70–99)
Glucose-Capillary: 74 mg/dL (ref 70–99)
Glucose-Capillary: 120 mg/dL — ABNORMAL HIGH (ref 70–99)

## 2022-08-15 LAB — CREATININE, SERUM
Creatinine, Ser: 0.91 mg/dL (ref 0.44–1.00)
GFR, Estimated: >60 mL/min (ref >60)

## 2022-08-15 LAB — CULTURE, BLOOD (ROUTINE X 2): Special Requests: ADEQUATE

## 2022-08-15 MED ORDER — STERILE WATER FOR INJECTION IJ SOLN
INTRAMUSCULAR | Status: AC
  Filled 2022-08-15: qty 10

## 2022-08-15 MED ORDER — SODIUM CHLORIDE 0.9 % IV SOLN: 2.0000 g | Freq: Two times a day (BID) | INTRAVENOUS | Status: DC

## 2022-08-15 MED ORDER — SODIUM CHLORIDE 0.9 % IV SOLN
2.0000 g | Freq: Three times a day (TID) | INTRAVENOUS | Status: DC
  Administered 2022-08-15 – 2022-08-19 (×13): 2 g via INTRAVENOUS
  Filled 2022-08-15 (×2): qty 12.5
  Filled 2022-08-15 (×3): qty 2
  Filled 2022-08-15 (×3): qty 12.5
  Filled 2022-08-15 (×3): qty 2
  Filled 2022-08-15: qty 12.5
  Filled 2022-08-15: qty 2
  Filled 2022-08-15: qty 12.5

## 2022-08-15 NOTE — Progress Notes (Signed)
PHARMACY - PHYSICIAN COMMUNICATION CRITICAL VALUE ALERT - BLOOD CULTURE IDENTIFICATION (BCID)  [name] is an 56 y.o. female who presented to [facility] on 08/11/2022 with a chief complaint of sepsis.  Assessment:  10/17 16:02 - 1 of 2 bottles (aerobic) growing GNR 10/17 22:41 - 1 of 2 bottles (anaerobic) growing Pantoea spp.  BCID detects Enterobacterales without resistance detected.  Because the two sets of blood cultures from 08/11/2022 were drawn >15 minutes apart, new growth on cultures was reported to pharmacy.  BCID previously run and reported 10/18. New growth has Gram stain consistent with previous Gram stain and BCID, so new BCID will not be run.  Name of physician (or Provider) Contacted: Ralene Muskrat, MD  Current antibiotics: Cefepime  Changes to prescribed antibiotics recommended: None. Patient is on recommended antibiotics - No changes needed  Results for orders placed or performed during the hospital encounter of 08/11/22  Blood Culture ID Panel (Reflexed) (Collected: 08/11/2022 10:41 PM)  Result Value Ref Range   Enterococcus faecalis NOT DETECTED NOT DETECTED   Enterococcus Faecium NOT DETECTED NOT DETECTED   Listeria monocytogenes NOT DETECTED NOT DETECTED   Staphylococcus species NOT DETECTED NOT DETECTED   Staphylococcus aureus (BCID) NOT DETECTED NOT DETECTED   Staphylococcus epidermidis NOT DETECTED NOT DETECTED   Staphylococcus lugdunensis NOT DETECTED NOT DETECTED   Streptococcus species NOT DETECTED NOT DETECTED   Streptococcus agalactiae NOT DETECTED NOT DETECTED   Streptococcus pneumoniae NOT DETECTED NOT DETECTED   Streptococcus pyogenes NOT DETECTED NOT DETECTED   A.calcoaceticus-baumannii NOT DETECTED NOT DETECTED   Bacteroides fragilis NOT DETECTED NOT DETECTED   Enterobacterales DETECTED (A) NOT DETECTED   Enterobacter cloacae complex NOT DETECTED NOT DETECTED   Escherichia coli NOT DETECTED NOT DETECTED   Klebsiella aerogenes NOT  DETECTED NOT DETECTED   Klebsiella oxytoca NOT DETECTED NOT DETECTED   Klebsiella pneumoniae NOT DETECTED NOT DETECTED   Proteus species NOT DETECTED NOT DETECTED   Salmonella species NOT DETECTED NOT DETECTED   Serratia marcescens NOT DETECTED NOT DETECTED   Haemophilus influenzae NOT DETECTED NOT DETECTED   Neisseria meningitidis NOT DETECTED NOT DETECTED   Pseudomonas aeruginosa NOT DETECTED NOT DETECTED   Stenotrophomonas maltophilia NOT DETECTED NOT DETECTED   Candida albicans NOT DETECTED NOT DETECTED   Candida auris NOT DETECTED NOT DETECTED   Candida glabrata NOT DETECTED NOT DETECTED   Candida krusei NOT DETECTED NOT DETECTED   Candida parapsilosis NOT DETECTED NOT DETECTED   Candida tropicalis NOT DETECTED NOT DETECTED   Cryptococcus neoformans/gattii NOT DETECTED NOT DETECTED   CTX-M ESBL NOT DETECTED NOT DETECTED   Carbapenem resistance IMP NOT DETECTED NOT DETECTED   Carbapenem resistance KPC NOT DETECTED NOT DETECTED   Carbapenem resistance NDM NOT DETECTED NOT DETECTED   Carbapenem resist OXA 48 LIKE NOT DETECTED NOT DETECTED   Carbapenem resistance VIM NOT DETECTED NOT DETECTED   Gretel Acre, PharmD PGY1 Pharmacy Resident 08/15/2022 10:51 AM

## 2022-08-15 NOTE — Plan of Care (Signed)

## 2022-08-15 NOTE — Progress Notes (Signed)
1910 patient alert x4 on room air able to make all needs known

## 2022-08-15 NOTE — Progress Notes (Signed)
PROGRESS NOTE    [name]  KDX:[id number] DOB: [birth year] DOA: 08/11/2022 PCP: Sandi Mariscal, MD    Brief Narrative:  56 year old female who presents for evaluation after syncopal event.  Unclear etiology of syncope.  Orthostatics negative.  At this time unable to exclude infection.  Patient was previously on long-term IV antibiotics for bacteremia.  Source at that time was unclear.  Procalcitonin markedly elevated.  Urinalysis not indicative of infection, chest x-ray negative.  Blood cultures taken on admission    Now growing Enterobacter species.  Vancomycin discontinued.  Cefepime continued. Infectious disease consulted.  Recommendations appreciated.     Assessment & Plan:   Principal Problem:   Syncope Active Problems:   Sepsis ([place])   AKI (acute kidney injury) ([place])   Alcoholism ([place])   Seizure ([place])   Hypotension   Bacteremia due to Gram-negative bacteria  Syncope No recurrent episodes.  Home Cymbalta and Flexeril and pain regimen have been held.  Orthostatics negative. Plan: Therapy as tolerated Fall precautions Okay for home regimen   Sepsis Gram negative bacteremia Patient presented with markedly elevated procalcitonin.  No clear source of infection.  Patient has a recent long-term antibiotic course for bacteremia of unclear etiology.  Current blood cultures positive for Pantoea.  Lactobacillus growing in the urine, questionable contaminant.  CT soft tissue neck negative for abscess or dental carry Plan: Continue cefepime Check tagged white cell scan (unclear whether this done at this facility) Monitor vitals and fever curve ID follow-up   Acute kidney injury Resolved.  Creatinine back to baseline.  IV fluids stopped   History of seizure disorder No meds on admission Outpatient follow-up   Alcohol use disorder Daily thiamine  DVT prophylaxis: SQ heparin Code Status: Full Family Communication: None today Disposition Plan: Status is:  Inpatient Remains inpatient appropriate because: Bacteremia of unclear etiology on IV antibiotics   Level of care: Med-Surg  Consultants:  ID  Procedures:  None  Antimicrobials: Cefepime   Subjective: Seen and examined.  Sitting comfortably in bed.  No visible distress  Objective: Vitals:   08/14/22 2239 08/15/22 0500 08/15/22 0635 08/15/22 0802  BP: (!) 140/76  115/66 118/64  Pulse: 81  68 70  Resp: '16  16 18  '$ Temp: 98.1 F (36.7 C)  98.1 F (36.7 C) 98.1 F (36.7 C)  TempSrc:      SpO2: 99%  96% 98%  Weight:  55.3 kg    Height:        Intake/Output Summary (Last 24 hours) at 08/15/2022 1103 Last data filed at 08/14/2022 1700 Gross per 24 hour  Intake 360 ml  Output 300 ml  Net 60 ml   Filed Weights   08/13/22 0500 08/14/22 0500 08/15/22 0500  Weight: 60.7 kg 60.7 kg 55.3 kg    Examination:  General exam: No acute distress Respiratory system: Lungs clear.  Normal work of breathing.  Room air Cardiovascular system: S1-S2, RRR, no murmurs, no pedal edema Gastrointestinal system: Thin, soft, NT/ND, normal bowel sounds Central nervous system: Alert and oriented. No focal neurological deficits. Extremities: Symmetric 5 x 5 power. Skin: No rashes, lesions or ulcers Psychiatry: Judgement and insight appear normal. Mood & affect appropriate.     Data Reviewed: I have personally reviewed following labs and imaging studies  CBC: Recent Labs  Lab 08/11/22 1602 08/13/22 0445  WBC 12.2* 5.4  NEUTROABS  --  2.3  HGB 11.3* 9.4*  HCT 34.7* 28.5*  MCV 88.1 89.3  PLT 331 149*  Basic Metabolic Panel: Recent Labs  Lab 08/11/22 1602 08/13/22 0445 08/14/22 0504 08/15/22 0841  NA 136 139 139  --   K 3.9 4.2 3.8  --   CL 109 109 103  --   CO2 18* 24 28  --   GLUCOSE 96 95 86  --   BUN '16 14 15  '$ --   CREATININE 1.00 0.82 0.90 0.91  CALCIUM 8.9 8.7* 8.7*  --    GFR: Estimated Creatinine Clearance: 51.5 mL/min (by C-G formula based on SCr of 0.91  mg/dL). Liver Function Tests: Recent Labs  Lab 08/12/22 0210  AST 26  ALT 15  ALKPHOS 62  BILITOT 0.9  PROT 6.4*  ALBUMIN 3.1*   No results for input(s): "LIPASE", "AMYLASE" in the last 168 hours. No results for input(s): "AMMONIA" in the last 168 hours. Coagulation Profile: No results for input(s): "INR", "PROTIME" in the last 168 hours. Cardiac Enzymes: Recent Labs  Lab 08/12/22 0210  CKTOTAL 45   BNP (last 3 results) No results for input(s): "PROBNP" in the last 8760 hours. HbA1C: No results for input(s): "HGBA1C" in the last 72 hours. CBG: Recent Labs  Lab 08/13/22 1210 08/13/22 1543 08/14/22 0456 08/15/22 0651 08/15/22 0805  GLUCAP 109* 81 118* 82 93   Lipid Profile: No results for input(s): "CHOL", "HDL", "LDLCALC", "TRIG", "CHOLHDL", "LDLDIRECT" in the last 72 hours. Thyroid Function Tests: No results for input(s): "TSH", "T4TOTAL", "FREET4", "T3FREE", "THYROIDAB" in the last 72 hours. Anemia Panel: No results for input(s): "VITAMINB12", "FOLATE", "FERRITIN", "TIBC", "IRON", "RETICCTPCT" in the last 72 hours. Sepsis Labs: Recent Labs  Lab 08/11/22 1602 08/11/22 2336 08/12/22 0210 08/13/22 0445 08/14/22 0504  PROCALCITON  --   --  15.48 8.33 5.27  LATICACIDVEN 2.6* 1.5  --   --   --     Recent Results (from the past 240 hour(s))  Resp Panel by RT-PCR (Flu A&B, Covid) Anterior Nasal Swab     Status: None   Collection Time: 08/11/22  4:01 PM   Specimen: Anterior Nasal Swab  Result Value Ref Range Status   SARS Coronavirus 2 by RT PCR NEGATIVE NEGATIVE Final    Comment: (NOTE) SARS-CoV-2 target nucleic acids are NOT DETECTED.  The SARS-CoV-2 RNA is generally detectable in upper respiratory specimens during the acute phase of infection. The lowest concentration of SARS-CoV-2 viral copies this assay can detect is 138 copies/mL. A negative result does not preclude SARS-Cov-2 infection and should not be used as the sole basis for treatment or other  patient management decisions. A negative result may occur with  improper specimen collection/handling, submission of specimen other than nasopharyngeal swab, presence of viral mutation(s) within the areas targeted by this assay, and inadequate number of viral copies(<138 copies/mL). A negative result must be combined with clinical observations, patient history, and epidemiological information. The expected result is Negative.  Fact Sheet for Patients:  EntrepreneurPulse.com.au  Fact Sheet for Healthcare Providers:  IncredibleEmployment.be  This test is no t yet approved or cleared by the [place] FDA and  has been authorized for detection and/or diagnosis of SARS-CoV-2 by FDA under an Emergency Use Authorization (EUA). This EUA will remain  in effect (meaning this test can be used) for the duration of the COVID-19 declaration under Section 564(b)(1) of the Act, 21 U.S.C.section 360bbb-3(b)(1), unless the authorization is terminated  or revoked sooner.       Influenza A by PCR NEGATIVE NEGATIVE Final   Influenza B by PCR NEGATIVE NEGATIVE Final  Comment: (NOTE) The Xpert Xpress SARS-CoV-2/FLU/RSV plus assay is intended as an aid in the diagnosis of influenza from Nasopharyngeal swab specimens and should not be used as a sole basis for treatment. Nasal washings and aspirates are unacceptable for Xpert Xpress SARS-CoV-2/FLU/RSV testing.  Fact Sheet for Patients: EntrepreneurPulse.com.au  Fact Sheet for Healthcare Providers: IncredibleEmployment.be  This test is not yet approved or cleared by the [place] FDA and has been authorized for detection and/or diagnosis of SARS-CoV-2 by FDA under an Emergency Use Authorization (EUA). This EUA will remain in effect (meaning this test can be used) for the duration of the COVID-19 declaration under Section 564(b)(1) of the Act, 21 U.S.C. section  360bbb-3(b)(1), unless the authorization is terminated or revoked.  Performed at [facility], [address] [place], [place] [address]   Blood culture (single)     Status: None (Preliminary result)   Collection Time: 08/11/22  4:02 PM   Specimen: BLOOD  Result Value Ref Range Status   Specimen Description BLOOD BLOOD RIGHT HAND  Final   Special Requests   Final    BOTTLES DRAWN AEROBIC AND ANAEROBIC Blood Culture adequate volume   Culture  Setup Time   Final    GRAM NEGATIVE RODS AEROBIC BOTTLE ONLY CRITICAL RESULT CALLED TO, READ BACK BY AND VERIFIED WITH: CAROLINE COULTER ON 08/15/22 AT 1038 QSD Performed at [facility], [address] [place], [place] [address]    Culture GRAM NEGATIVE RODS  Final   Report Status PENDING  Incomplete  Urine Culture     Status: Abnormal   Collection Time: 08/11/22  4:02 PM   Specimen: Urine, Random  Result Value Ref Range Status   Specimen Description   Final    URINE, RANDOM Performed at [facility], [address], [place], [place] [address]    Special Requests   Final    NONE Performed at [facility], [address] [place], [place] [address]    Culture (A)  Final    30,000 COLONIES/mL LACTOBACILLUS SPECIES Standardized susceptibility testing for this organism is not available. Performed at [facility], [place] [address], [place], [place] [address]    Report Status 08/13/2022 FINAL  Final  Blood Culture (routine x 2)     Status: Abnormal   Collection Time: 08/11/22 10:41 PM   Specimen: BLOOD  Result Value Ref Range Status   Specimen Description   Final    BLOOD LEFT ANTECUBITAL Performed at [facility], [address], [place], [place] [address]    Special Requests   Final    BOTTLES DRAWN AEROBIC AND ANAEROBIC Blood Culture adequate volume Performed at [facility], [address] [place], [place] [address]    Culture  Setup Time   Final    Organism ID  to follow GRAM NEGATIVE RODS ANAEROBIC BOTTLE ONLY CRITICAL RESULT CALLED TO, READ BACK BY AND VERIFIED WITH: NATHAN BELUE AT 2239 ON 08/12/22 BY SS Performed at [facility], [address] [place], [place] [address]    Culture PANTOEA SPECIES (A)  Final   Report Status 08/15/2022 FINAL  Final   Organism ID, Bacteria PANTOEA SPECIES  Final      Susceptibility   Pantoea species - MIC*    CEFAZOLIN >=64 RESISTANT Resistant     CEFEPIME <=0.12 SENSITIVE Sensitive     CEFTAZIDIME <=1 SENSITIVE Sensitive     CEFTRIAXONE <=0.25 SENSITIVE Sensitive     CIPROFLOXACIN <=0.25 SENSITIVE Sensitive  GENTAMICIN <=1 SENSITIVE Sensitive     IMIPENEM <=0.25 SENSITIVE Sensitive     TRIMETH/SULFA <=20 SENSITIVE Sensitive     PIP/TAZO <=4 SENSITIVE Sensitive     * PANTOEA SPECIES  Blood Culture ID Panel (Reflexed)     Status: Abnormal   Collection Time: 08/11/22 10:41 PM  Result Value Ref Range Status   Enterococcus faecalis NOT DETECTED NOT DETECTED Final   Enterococcus Faecium NOT DETECTED NOT DETECTED Final   Listeria monocytogenes NOT DETECTED NOT DETECTED Final   Staphylococcus species NOT DETECTED NOT DETECTED Final   Staphylococcus aureus (BCID) NOT DETECTED NOT DETECTED Final   Staphylococcus epidermidis NOT DETECTED NOT DETECTED Final   Staphylococcus lugdunensis NOT DETECTED NOT DETECTED Final   Streptococcus species NOT DETECTED NOT DETECTED Final   Streptococcus agalactiae NOT DETECTED NOT DETECTED Final   Streptococcus pneumoniae NOT DETECTED NOT DETECTED Final   Streptococcus pyogenes NOT DETECTED NOT DETECTED Final   A.calcoaceticus-baumannii NOT DETECTED NOT DETECTED Final   Bacteroides fragilis NOT DETECTED NOT DETECTED Final   Enterobacterales DETECTED (A) NOT DETECTED Final    Comment: Enterobacterales represent a large order of gram negative bacteria, not a single organism. Refer to culture for further identification. CRITICAL RESULT CALLED TO, READ BACK BY AND  VERIFIED WITH: NATHAN BELUE AT 2239 ON 08/12/22 BY SS    Enterobacter cloacae complex NOT DETECTED NOT DETECTED Final   Escherichia coli NOT DETECTED NOT DETECTED Final   Klebsiella aerogenes NOT DETECTED NOT DETECTED Final   Klebsiella oxytoca NOT DETECTED NOT DETECTED Final   Klebsiella pneumoniae NOT DETECTED NOT DETECTED Final   Proteus species NOT DETECTED NOT DETECTED Final   Salmonella species NOT DETECTED NOT DETECTED Final   Serratia marcescens NOT DETECTED NOT DETECTED Final   Haemophilus influenzae NOT DETECTED NOT DETECTED Final   Neisseria meningitidis NOT DETECTED NOT DETECTED Final   Pseudomonas aeruginosa NOT DETECTED NOT DETECTED Final   Stenotrophomonas maltophilia NOT DETECTED NOT DETECTED Final   Candida albicans NOT DETECTED NOT DETECTED Final   Candida auris NOT DETECTED NOT DETECTED Final   Candida glabrata NOT DETECTED NOT DETECTED Final   Candida krusei NOT DETECTED NOT DETECTED Final   Candida parapsilosis NOT DETECTED NOT DETECTED Final   Candida tropicalis NOT DETECTED NOT DETECTED Final   Cryptococcus neoformans/gattii NOT DETECTED NOT DETECTED Final   CTX-M ESBL NOT DETECTED NOT DETECTED Final   Carbapenem resistance IMP NOT DETECTED NOT DETECTED Final   Carbapenem resistance KPC NOT DETECTED NOT DETECTED Final   Carbapenem resistance NDM NOT DETECTED NOT DETECTED Final   Carbapenem resist OXA 48 LIKE NOT DETECTED NOT DETECTED Final   Carbapenem resistance VIM NOT DETECTED NOT DETECTED Final    Comment: Performed at [facility], [address] [place], [place] [address]  Culture, blood (Routine X 2) w Reflex to ID Panel     Status: None (Preliminary result)   Collection Time: 08/14/22 12:07 PM   Specimen: BLOOD  Result Value Ref Range Status   Specimen Description BLOOD BLOOD RIGHT WRIST  Final   Special Requests   Final    BOTTLES DRAWN AEROBIC AND ANAEROBIC Blood Culture adequate volume   Culture   Final    NO GROWTH < 24  HOURS Performed at [facility], [address] [place], [place] [address]    Report Status PENDING  Incomplete  Culture, blood (Routine X 2) w Reflex to ID Panel     Status: None (Preliminary result)   Collection Time:  08/14/22 12:16 PM   Specimen: BLOOD  Result Value Ref Range Status   Specimen Description BLOOD RIGHT ANTECUBITAL  Final   Special Requests   Final    BOTTLES DRAWN AEROBIC AND ANAEROBIC Blood Culture adequate volume   Culture   Final    NO GROWTH < 24 HOURS Performed at [facility], [address], [place], [place] [address]    Report Status PENDING  Incomplete         Radiology Studies: CT SOFT TISSUE NECK W CONTRAST  Result Date: 08/14/2022 CLINICAL DATA:  Concern for sublingual/submandibular abscess. EXAM: CT NECK WITH CONTRAST TECHNIQUE: Multidetector CT imaging of the neck was performed using the standard protocol following the bolus administration of intravenous contrast. RADIATION DOSE REDUCTION: This exam was performed according to the departmental dose-optimization program which includes automated exposure control, adjustment of the mA and/or kV according to patient size and/or use of iterative reconstruction technique. CONTRAST:  68m OMNIPAQUE IOHEXOL 300 MG/ML  SOLN COMPARISON:  None Available. FINDINGS: Pharynx and larynx: Normal. No mass or swelling. Salivary glands: No inflammation, mass, or stone. Thyroid: Normal. Lymph nodes: None enlarged or abnormal density. Vascular: Negative. Limited intracranial: Negative. Visualized orbits: Negative. Mastoids and visualized paranasal sinuses: Clear. Skeleton: Status post C4-C7 ACDF. Grade 1 anterolisthesis of C7 on T1. Spinal fusion hardware is intact. There is osseous fusion at the surgical levels. Upper chest: Negative. Other: None. IMPRESSION: No evidence of sublingual/submandibular abscess. No evidence of dental caries at the remaining teeth along the maxillary and mandibular arches. No of  periapical or soft tissue abscess. Electronically Signed   By: HMarin RobertsM.D.   On: 08/14/2022 13:14        Scheduled Meds:  amphetamine-dextroamphetamine  10 mg Oral BID   DULoxetine  30 mg Oral BID   gabapentin  300 mg Oral TID   heparin  5,000 Units Subcutaneous Q8H   pantoprazole (PROTONIX) IV  40 mg Intravenous Q12H   sodium chloride flush  3 mL Intravenous Q12H   sterile water (preservative free)       thiamine (VITAMIN B1) injection  100 mg Intravenous Daily   Continuous Infusions:  ceFEPime (MAXIPIME) IV 2 g (08/15/22 0550)     LOS: 3 days    SSidney Ace MD [facility]   If 7PM-7AM, please contact night-coverage  08/15/2022, 11:03 AM

## 2022-08-16 DIAGNOSIS — R55 Syncope and collapse: Secondary | ICD-10-CM | POA: Diagnosis not present

## 2022-08-16 LAB — GLUCOSE, CAPILLARY: Glucose-Capillary: 94 mg/dL (ref 70–99)

## 2022-08-16 MED ORDER — METHOCARBAMOL 500 MG PO TABS: 500.0000 mg | ORAL_TABLET | Freq: Three times a day (TID) | ORAL | Status: DC

## 2022-08-16 MED ORDER — CYCLOBENZAPRINE HCL 10 MG PO TABS
10.0000 mg | ORAL_TABLET | Freq: Three times a day (TID) | ORAL | Status: DC
  Administered 2022-08-16 – 2022-08-19 (×10): 10 mg via ORAL
  Filled 2022-08-16 (×10): qty 1

## 2022-08-16 NOTE — Plan of Care (Addendum)
  Problem: Education: Goal: Knowledge of condition and prescribed therapy will improve Outcome: Progressing   Problem: Cardiac: Goal: Will achieve and/or maintain adequate cardiac output Outcome: Progressing   Problem: Physical Regulation: Goal: Complications related to the disease process, condition or treatment will be avoided or minimized Outcome: Progressing   Problem: Education: Goal: Knowledge of General Education information will improve Description: Including pain rating scale, medication(s)/side effects and non-pharmacologic comfort measures Outcome: Progressing   Problem: Health Behavior/Discharge Planning: Goal: Ability to manage health-related needs will improve Outcome: Progressing   Problem: Clinical Measurements: Goal: Ability to maintain clinical measurements within normal limits will improve Outcome: Progressing Goal: Will remain free from infection Outcome: Progressing Goal: Diagnostic test results will improve Outcome: Progressing Goal: Respiratory complications will improve Outcome: Progressing Goal: Cardiovascular complication will be avoided Outcome: Progressing   Problem: Activity: Goal: Risk for activity intolerance will decrease Outcome: Progressing   Problem: Nutrition: Goal: Adequate nutrition will be maintained Outcome: Progressing   Problem: Coping: Goal: Level of anxiety will decrease Outcome: Progressing   Problem: Elimination: Goal: Will not experience complications related to bowel motility Outcome: Progressing Goal: Will not experience complications related to urinary retention Outcome: Progressing   Problem: Pain Managment: Goal: General experience of comfort will improve Outcome: Not Progressing Note: R/T chronic back pain   Problem: Safety: Goal: Ability to remain free from injury will improve Outcome: Progressing   Problem: Skin Integrity: Goal: Risk for impaired skin integrity will decrease Outcome: Progressing

## 2022-08-16 NOTE — Plan of Care (Signed)
  Problem: Education: Goal: Knowledge of condition and prescribed therapy will improve Outcome: Progressing   Problem: Cardiac: Goal: Will achieve and/or maintain adequate cardiac output Outcome: Progressing   Problem: Physical Regulation: Goal: Complications related to the disease process, condition or treatment will be avoided or minimized Outcome: Progressing   Problem: Education: Goal: Knowledge of General Education information will improve Description: Including pain rating scale, medication(s)/side effects and non-pharmacologic comfort measures Outcome: Progressing   Problem: Health Behavior/Discharge Planning: Goal: Ability to manage health-related needs will improve Outcome: Progressing   Problem: Clinical Measurements: Goal: Ability to maintain clinical measurements within normal limits will improve Outcome: Progressing Goal: Will remain free from infection Outcome: Progressing Goal: Diagnostic test results will improve Outcome: Progressing Goal: Respiratory complications will improve Outcome: Progressing Goal: Cardiovascular complication will be avoided Outcome: Progressing   Problem: Activity: Goal: Risk for activity intolerance will decrease Outcome: Progressing   Problem: Nutrition: Goal: Adequate nutrition will be maintained Outcome: Progressing   Problem: Coping: Goal: Level of anxiety will decrease Outcome: Progressing   Problem: Elimination: Goal: Will not experience complications related to bowel motility Outcome: Progressing Goal: Will not experience complications related to urinary retention Outcome: Progressing   Problem: Pain Managment: Goal: General experience of comfort will improve Outcome:  Not Progressing Patient has chronic back pain   Problem: Safety: Goal: Ability to remain free from injury will improve Outcome: Progressing   Problem: Skin Integrity: Goal: Risk for impaired skin integrity will decrease Outcome: Progressing

## 2022-08-16 NOTE — Progress Notes (Signed)
2000 patient alert able to make all needs known ambulated in room no complications

## 2022-08-16 NOTE — Progress Notes (Signed)
PROGRESS NOTE    [name]  BMW:[id number] DOB: August 14, 1966 DOA: 08/11/2022 PCP: Sandi Mariscal, MD    Brief Narrative:  56 year old female who presents for evaluation after syncopal event.  Unclear etiology of syncope.  Orthostatics negative.  At this time unable to exclude infection.  Patient was previously on long-term IV antibiotics for bacteremia.  Source at that time was unclear.  Procalcitonin markedly elevated.  Urinalysis not indicative of infection, chest x-ray negative.  Blood cultures taken on admission    Now growing Enterobacter species.  Vancomycin discontinued.  Cefepime continued. Infectious disease consulted.  Recommendations appreciated.  Nuclear medicine tagged white cell scan ordered.  Pending.     Assessment & Plan:   Principal Problem:   Syncope Active Problems:   Sepsis ([place])   AKI (acute kidney injury) ([place])   Alcoholism ([place])   Seizure ([place])   Hypotension   Bacteremia due to Gram-negative bacteria  Syncope No recurrent episodes.   Home Cymbalta and Flexeril and pain regimen have been held.   Orthostatics negative. Plan: Therapy as tolerated Fall precautions Okay for home pain regimen   Sepsis Gram negative bacteremia Patient presented with markedly elevated procalcitonin.  No clear source of infection.  Patient has a recent long-term antibiotic course for bacteremia of unclear etiology.  Current blood cultures positive for Pantoea.  Lactobacillus growing in the urine, questionable contaminant.  CT soft tissue neck negative for abscess or dental carry Plan: Continue cefepime Tagged white cell scan ordered, pending Monitor vitals and fever curve ID follow-up tomorrow   Acute kidney injury Resolved.  Creatinine back to baseline.  IV fluids stopped   History of seizure disorder No meds on admission Outpatient follow-up   Alcohol use disorder Daily thiamine  DVT prophylaxis: SQ heparin Code Status: Full Family Communication: Husband  via phone 10/21 Disposition Plan: Status is: Inpatient Remains inpatient appropriate because: Bacteremia of unclear etiology on IV antibiotics   Level of care: Med-Surg  Consultants:  ID  Procedures:  None  Antimicrobials: Cefepime   Subjective: Seen and examined.  Resting comfortably in bed.  No visible distress.  No pain complaints.  Objective: Vitals:   08/15/22 2025 08/16/22 0500 08/16/22 0535 08/16/22 0822  BP: 117/66  136/73 119/72  Pulse: 83  72 85  Resp: '16  16 15  '$ Temp: 98.5 F (36.9 C)  98.6 F (37 C) 97.8 F (36.6 C)  TempSrc: Oral     SpO2: 97%  96% 100%  Weight:  60.6 kg    Height:        Intake/Output Summary (Last 24 hours) at 08/16/2022 1055 Last data filed at 08/16/2022 0500 Gross per 24 hour  Intake 746.04 ml  Output --  Net 746.04 ml   Filed Weights   08/14/22 0500 08/15/22 0500 08/16/22 0500  Weight: 60.7 kg 55.3 kg 60.6 kg    Examination:  General exam: NAD Respiratory system: Lungs clear.  Normal work of breathing.  Room air Cardiovascular system: S1-S2, RRR, no murmurs, no pedal edema Gastrointestinal system: Thin, soft, NT/ND, normal bowel sounds Central nervous system: Alert and oriented. No focal neurological deficits. Extremities: Symmetric 5 x 5 power. Skin: Superficial lesions on face.  Poor dentition Psychiatry: Judgement and insight appear normal. Mood & affect appropriate.     Data Reviewed: I have personally reviewed following labs and imaging studies  CBC: Recent Labs  Lab 08/11/22 1602 08/13/22 0445  WBC 12.2* 5.4  NEUTROABS  --  2.3  HGB 11.3* 9.4*  HCT 34.7* 28.5*  MCV 88.1 89.3  PLT 331 250*   Basic Metabolic Panel: Recent Labs  Lab 08/11/22 1602 08/13/22 0445 08/14/22 0504 08/15/22 0841  NA 136 139 139  --   K 3.9 4.2 3.8  --   CL 109 109 103  --   CO2 18* 24 28  --   GLUCOSE 96 95 86  --   BUN '16 14 15  '$ --   CREATININE 1.00 0.82 0.90 0.91  CALCIUM 8.9 8.7* 8.7*  --    GFR: Estimated  Creatinine Clearance: 57 mL/min (by C-G formula based on SCr of 0.91 mg/dL). Liver Function Tests: Recent Labs  Lab 08/12/22 0210  AST 26  ALT 15  ALKPHOS 62  BILITOT 0.9  PROT 6.4*  ALBUMIN 3.1*   No results for input(s): "LIPASE", "AMYLASE" in the last 168 hours. No results for input(s): "AMMONIA" in the last 168 hours. Coagulation Profile: No results for input(s): "INR", "PROTIME" in the last 168 hours. Cardiac Enzymes: Recent Labs  Lab 08/12/22 0210  CKTOTAL 45   BNP (last 3 results) No results for input(s): "PROBNP" in the last 8760 hours. HbA1C: No results for input(s): "HGBA1C" in the last 72 hours. CBG: Recent Labs  Lab 08/15/22 0651 08/15/22 0805 08/15/22 1143 08/15/22 1536 08/16/22 0534  GLUCAP 82 93 74 120* 94   Lipid Profile: No results for input(s): "CHOL", "HDL", "LDLCALC", "TRIG", "CHOLHDL", "LDLDIRECT" in the last 72 hours. Thyroid Function Tests: No results for input(s): "TSH", "T4TOTAL", "FREET4", "T3FREE", "THYROIDAB" in the last 72 hours. Anemia Panel: No results for input(s): "VITAMINB12", "FOLATE", "FERRITIN", "TIBC", "IRON", "RETICCTPCT" in the last 72 hours. Sepsis Labs: Recent Labs  Lab 08/11/22 1602 08/11/22 2336 08/12/22 0210 08/13/22 0445 08/14/22 0504  PROCALCITON  --   --  15.48 8.33 5.27  LATICACIDVEN 2.6* 1.5  --   --   --     Recent Results (from the past 240 hour(s))  Resp Panel by RT-PCR (Flu A&B, Covid) Anterior Nasal Swab     Status: None   Collection Time: 08/11/22  4:01 PM   Specimen: Anterior Nasal Swab  Result Value Ref Range Status   SARS Coronavirus 2 by RT PCR NEGATIVE NEGATIVE Final    Comment: (NOTE) SARS-CoV-2 target nucleic acids are NOT DETECTED.  The SARS-CoV-2 RNA is generally detectable in upper respiratory specimens during the acute phase of infection. The lowest concentration of SARS-CoV-2 viral copies this assay can detect is 138 copies/mL. A negative result does not preclude SARS-Cov-2 infection  and should not be used as the sole basis for treatment or other patient management decisions. A negative result may occur with  improper specimen collection/handling, submission of specimen other than nasopharyngeal swab, presence of viral mutation(s) within the areas targeted by this assay, and inadequate number of viral copies(<138 copies/mL). A negative result must be combined with clinical observations, patient history, and epidemiological information. The expected result is Negative.  Fact Sheet for Patients:  EntrepreneurPulse.com.au  Fact Sheet for Healthcare Providers:  IncredibleEmployment.be  This test is no t yet approved or cleared by the [place] FDA and  has been authorized for detection and/or diagnosis of SARS-CoV-2 by FDA under an Emergency Use Authorization (EUA). This EUA will remain  in effect (meaning this test can be used) for the duration of the COVID-19 declaration under Section 564(b)(1) of the Act, 21 U.S.C.section 360bbb-3(b)(1), unless the authorization is terminated  or revoked sooner.       Influenza A by  PCR NEGATIVE NEGATIVE Final   Influenza B by PCR NEGATIVE NEGATIVE Final    Comment: (NOTE) The Xpert Xpress SARS-CoV-2/FLU/RSV plus assay is intended as an aid in the diagnosis of influenza from Nasopharyngeal swab specimens and should not be used as a sole basis for treatment. Nasal washings and aspirates are unacceptable for Xpert Xpress SARS-CoV-2/FLU/RSV testing.  Fact Sheet for Patients: EntrepreneurPulse.com.au  Fact Sheet for Healthcare Providers: IncredibleEmployment.be  This test is not yet approved or cleared by the [place] FDA and has been authorized for detection and/or diagnosis of SARS-CoV-2 by FDA under an Emergency Use Authorization (EUA). This EUA will remain in effect (meaning this test can be used) for the duration of the COVID-19 declaration  under Section 564(b)(1) of the Act, 21 U.S.C. section 360bbb-3(b)(1), unless the authorization is terminated or revoked.  Performed at [facility], [address] [place], [place] [address]   Blood culture (single)     Status: None (Preliminary result)   Collection Time: 08/11/22  4:02 PM   Specimen: BLOOD  Result Value Ref Range Status   Specimen Description   Final    BLOOD BLOOD RIGHT HAND Performed at [facility], [address] [place], [place] [address]    Special Requests   Final    BOTTLES DRAWN AEROBIC AND ANAEROBIC Blood Culture adequate volume Performed at [facility], [address] [place], [place] [address]    Culture  Setup Time   Final    GRAM NEGATIVE RODS AEROBIC BOTTLE ONLY CRITICAL RESULT CALLED TO, READ BACK BY AND VERIFIED WITH: CAROLINE COULTER ON 08/15/22 AT 1038 QSD CRITICAL VALUE NOTED.  VALUE IS CONSISTENT WITH PREVIOUSLY REPORTED AND CALLED VALUE. Performed at [facility], [place] [address] [place], [place] [address]    Culture GRAM NEGATIVE RODS  Final   Report Status PENDING  Incomplete  Urine Culture     Status: Abnormal   Collection Time: 08/11/22  4:02 PM   Specimen: Urine, Random  Result Value Ref Range Status   Specimen Description   Final    URINE, RANDOM Performed at [facility], [address] [place], [place] [address]    Special Requests   Final    NONE Performed at [facility], [address] [place], [place] [address]    Culture (A)  Final    30,000 COLONIES/mL LACTOBACILLUS SPECIES Standardized susceptibility testing for this organism is not available. Performed at [facility], [place] [address], [place], [place] [address]    Report Status 08/13/2022 FINAL  Final  Blood Culture (routine x 2)     Status: Abnormal   Collection Time: 08/11/22 10:41 PM   Specimen: BLOOD  Result Value Ref Range Status   Specimen Description   Final    BLOOD LEFT  ANTECUBITAL Performed at [facility], [address], [place], [place] [address]    Special Requests   Final    BOTTLES DRAWN AEROBIC AND ANAEROBIC Blood Culture adequate volume Performed at [facility], [address] [place], [place] [address]    Culture  Setup Time   Final    Organism ID to follow [place] BOTTLE ONLY CRITICAL RESULT CALLED TO, READ BACK BY AND VERIFIED WITH: NATHAN BELUE AT 2239 ON 08/12/22 BY SS Performed at [facility], [address] [place], [place] [address]    Culture PANTOEA SPECIES (A)  Final   Report Status 08/15/2022 FINAL  Final  Organism ID, Bacteria PANTOEA SPECIES  Final      Susceptibility   Pantoea species - MIC*    CEFAZOLIN >=64 RESISTANT Resistant     CEFEPIME <=0.12 SENSITIVE Sensitive     CEFTAZIDIME <=1 SENSITIVE Sensitive     CEFTRIAXONE <=0.25 SENSITIVE Sensitive     CIPROFLOXACIN <=0.25 SENSITIVE Sensitive     GENTAMICIN <=1 SENSITIVE Sensitive     IMIPENEM <=0.25 SENSITIVE Sensitive     TRIMETH/SULFA <=20 SENSITIVE Sensitive     PIP/TAZO <=4 SENSITIVE Sensitive     * PANTOEA SPECIES  Blood Culture ID Panel (Reflexed)     Status: Abnormal   Collection Time: 08/11/22 10:41 PM  Result Value Ref Range Status   Enterococcus faecalis NOT DETECTED NOT DETECTED Final   Enterococcus Faecium NOT DETECTED NOT DETECTED Final   Listeria monocytogenes NOT DETECTED NOT DETECTED Final   Staphylococcus species NOT DETECTED NOT DETECTED Final   Staphylococcus aureus (BCID) NOT DETECTED NOT DETECTED Final   Staphylococcus epidermidis NOT DETECTED NOT DETECTED Final   Staphylococcus lugdunensis NOT DETECTED NOT DETECTED Final   Streptococcus species NOT DETECTED NOT DETECTED Final   Streptococcus agalactiae NOT DETECTED NOT DETECTED Final   Streptococcus pneumoniae NOT DETECTED NOT DETECTED Final   Streptococcus pyogenes NOT DETECTED NOT DETECTED Final   A.calcoaceticus-baumannii NOT  DETECTED NOT DETECTED Final   Bacteroides fragilis NOT DETECTED NOT DETECTED Final   Enterobacterales DETECTED (A) NOT DETECTED Final    Comment: Enterobacterales represent a large order of gram negative bacteria, not a single organism. Refer to culture for further identification. CRITICAL RESULT CALLED TO, READ BACK BY AND VERIFIED WITH: NATHAN BELUE AT 2239 ON 08/12/22 BY SS    Enterobacter cloacae complex NOT DETECTED NOT DETECTED Final   Escherichia coli NOT DETECTED NOT DETECTED Final   Klebsiella aerogenes NOT DETECTED NOT DETECTED Final   Klebsiella oxytoca NOT DETECTED NOT DETECTED Final   Klebsiella pneumoniae NOT DETECTED NOT DETECTED Final   Proteus species NOT DETECTED NOT DETECTED Final   Salmonella species NOT DETECTED NOT DETECTED Final   Serratia marcescens NOT DETECTED NOT DETECTED Final   Haemophilus influenzae NOT DETECTED NOT DETECTED Final   Neisseria meningitidis NOT DETECTED NOT DETECTED Final   Pseudomonas aeruginosa NOT DETECTED NOT DETECTED Final   Stenotrophomonas maltophilia NOT DETECTED NOT DETECTED Final   Candida albicans NOT DETECTED NOT DETECTED Final   Candida auris NOT DETECTED NOT DETECTED Final   Candida glabrata NOT DETECTED NOT DETECTED Final   Candida krusei NOT DETECTED NOT DETECTED Final   Candida parapsilosis NOT DETECTED NOT DETECTED Final   Candida tropicalis NOT DETECTED NOT DETECTED Final   Cryptococcus neoformans/gattii NOT DETECTED NOT DETECTED Final   CTX-M ESBL NOT DETECTED NOT DETECTED Final   Carbapenem resistance IMP NOT DETECTED NOT DETECTED Final   Carbapenem resistance KPC NOT DETECTED NOT DETECTED Final   Carbapenem resistance NDM NOT DETECTED NOT DETECTED Final   Carbapenem resist OXA 48 LIKE NOT DETECTED NOT DETECTED Final   Carbapenem resistance VIM NOT DETECTED NOT DETECTED Final    Comment: Performed at [facility], [address] [place], [place] [address]  Culture, blood (Routine X 2) w Reflex to ID Panel      Status: None (Preliminary result)   Collection Time: 08/14/22 12:07 PM   Specimen: BLOOD  Result Value Ref Range Status   Specimen Description BLOOD BLOOD RIGHT WRIST  Final   Special Requests   Final    BOTTLES DRAWN AEROBIC AND  ANAEROBIC Blood Culture adequate volume   Culture   Final    NO GROWTH 2 DAYS Performed at [facility], [address] [place], [place] [address]    Report Status PENDING  Incomplete  Culture, blood (Routine X 2) w Reflex to ID Panel     Status: None (Preliminary result)   Collection Time: 08/14/22 12:16 PM   Specimen: BLOOD  Result Value Ref Range Status   Specimen Description BLOOD RIGHT ANTECUBITAL  Final   Special Requests   Final    BOTTLES DRAWN AEROBIC AND ANAEROBIC Blood Culture adequate volume   Culture   Final    NO GROWTH 2 DAYS Performed at [facility], [address] [place], [place] [address]    Report Status PENDING  Incomplete         Radiology Studies: CT SOFT TISSUE NECK W CONTRAST  Result Date: 08/14/2022 CLINICAL DATA:  Concern for sublingual/submandibular abscess. EXAM: CT NECK WITH CONTRAST TECHNIQUE: Multidetector CT imaging of the neck was performed using the standard protocol following the bolus administration of intravenous contrast. RADIATION DOSE REDUCTION: This exam was performed according to the departmental dose-optimization program which includes automated exposure control, adjustment of the mA and/or kV according to patient size and/or use of iterative reconstruction technique. CONTRAST:  13m OMNIPAQUE IOHEXOL 300 MG/ML  SOLN COMPARISON:  None Available. FINDINGS: Pharynx and larynx: Normal. No mass or swelling. Salivary glands: No inflammation, mass, or stone. Thyroid: Normal. Lymph nodes: None enlarged or abnormal density. Vascular: Negative. Limited intracranial: Negative. Visualized orbits: Negative. Mastoids and visualized paranasal sinuses: Clear. Skeleton: Status post C4-C7 ACDF. Grade 1  anterolisthesis of C7 on T1. Spinal fusion hardware is intact. There is osseous fusion at the surgical levels. Upper chest: Negative. Other: None. IMPRESSION: No evidence of sublingual/submandibular abscess. No evidence of dental caries at the remaining teeth along the maxillary and mandibular arches. No of periapical or soft tissue abscess. Electronically Signed   By: HMarin RobertsM.D.   On: 08/14/2022 13:14        Scheduled Meds:  amphetamine-dextroamphetamine  10 mg Oral BID   cyclobenzaprine  10 mg Oral TID   DULoxetine  30 mg Oral BID   gabapentin  300 mg Oral TID   heparin  5,000 Units Subcutaneous Q8H   pantoprazole (PROTONIX) IV  40 mg Intravenous Q12H   sodium chloride flush  3 mL Intravenous Q12H   thiamine (VITAMIN B1) injection  100 mg Intravenous Daily   Continuous Infusions:  ceFEPime (MAXIPIME) IV 2 g (08/16/22 0550)     LOS: 4 days    SSidney Ace MD [facility]   If 7PM-7AM, please contact night-coverage  08/16/2022, 10:55 AM

## 2022-08-17 DIAGNOSIS — R55 Syncope and collapse: Secondary | ICD-10-CM | POA: Diagnosis not present

## 2022-08-17 DIAGNOSIS — I959 Hypotension, unspecified: Secondary | ICD-10-CM | POA: Diagnosis not present

## 2022-08-17 DIAGNOSIS — R7881 Bacteremia: Secondary | ICD-10-CM | POA: Diagnosis not present

## 2022-08-17 LAB — CBC WITH DIFFERENTIAL/PLATELET
Abs Immature Granulocytes: 0.02 10*3/uL (ref 0.00–0.07)
Basophils Absolute: 0 10*3/uL (ref 0.0–0.1)
Basophils Relative: 0 %
Eosinophils Absolute: 0.5 10*3/uL (ref 0.0–0.5)
Eosinophils Relative: 9 %
HCT: 37.5 % (ref 36.0–46.0)
Hemoglobin: 12.2 g/dL (ref 12.0–15.0)
Immature Granulocytes: 0 %
Lymphocytes Relative: 42 %
Lymphs Abs: 2.2 10*3/uL (ref 0.7–4.0)
MCH: 29.4 pg (ref 26.0–34.0)
MCHC: 32.5 g/dL (ref 30.0–36.0)
MCV: 90.4 fL (ref 80.0–100.0)
Monocytes Absolute: 0.3 10*3/uL (ref 0.1–1.0)
Monocytes Relative: 5 %
Neutro Abs: 2.3 10*3/uL (ref 1.7–7.7)
Neutrophils Relative %: 44 %
Platelets: 215 10*3/uL (ref 150–400)
RBC: 4.15 MIL/uL (ref 3.87–5.11)
RDW: 14.7 % (ref 11.5–15.5)
WBC: 5.2 10*3/uL (ref 4.0–10.5)
nRBC: 0 % (ref 0.0–0.2)

## 2022-08-17 LAB — CULTURE, BLOOD (SINGLE): Special Requests: ADEQUATE

## 2022-08-17 LAB — GLUCOSE, CAPILLARY
Glucose-Capillary: 93 mg/dL (ref 70–99)
Glucose-Capillary: 96 mg/dL (ref 70–99)

## 2022-08-17 MED ORDER — OXYCODONE HCL 5 MG PO TABS
10.0000 mg | ORAL_TABLET | ORAL | Status: DC | PRN
  Administered 2022-08-17 – 2022-08-19 (×7): 10 mg via ORAL
  Filled 2022-08-17 (×7): qty 2

## 2022-08-17 NOTE — Progress Notes (Signed)
   Date of Admission:  08/11/2022     ID: [name] is a 56 y.o. female Principal Problem:   Syncope Active Problems:   Alcoholism ([place])   Seizure ([place])   Hypotension   Sepsis ([place])   AKI (acute kidney injury) ([place])   Bacteremia due to Gram-negative bacteria    Subjective: Pt has no specific complaints Doing well  Medications:   amphetamine-dextroamphetamine  10 mg Oral BID   cyclobenzaprine  10 mg Oral TID   DULoxetine  30 mg Oral BID   gabapentin  300 mg Oral TID   heparin  5,000 Units Subcutaneous Q8H   pantoprazole (PROTONIX) IV  40 mg Intravenous Q12H   sodium chloride flush  3 mL Intravenous Q12H   thiamine (VITAMIN B1) injection  100 mg Intravenous Daily    Objective: Vital signs in last 24 hours: Patient Vitals for the past 24 hrs:  BP Temp Temp src Pulse Resp SpO2 Weight  08/17/22 1557 (!) 102/50 98.4 F (36.9 C) -- 85 16 98 % --  08/17/22 1131 (!) 105/55 98.2 F (36.8 C) -- 90 16 100 % --  08/17/22 0700 97/63 97.6 F (36.4 C) Oral 74 17 98 % --  08/17/22 0543 121/70 97.9 F (36.6 C) -- 74 -- 98 % --  08/17/22 0500 -- -- -- -- -- -- 60.9 kg  08/16/22 2100 -- -- -- -- 19 -- --  08/16/22 2020 117/70 98.3 F (36.8 C) Oral 76 -- 100 % --       PHYSICAL EXAM:  Walking in the corridor  Lab Results Recent Labs    08/15/22 0841 08/17/22 0927  WBC  --  5.2  HGB  --  12.2  HCT  --  37.5  CREATININE 0.91  --       Assessment/Plan: syncope  Chills High procal Recurrent Pantoea bacteremia First episode  on 07/08/22 with extensive work up ( TEE, MRI entire spine, CT abd/pelvis/chest/brain) were all negative She took 5 days of IV in the hospital , repeat blood culture on 07/10/22 ws negative. She was sent ho e on PO bactrim for 7 days which she did not take, I saw her in my office on 07/30/22 and repeated blood culture which was negative She came back to the ED on 08/11/22 and blood culture was positive for PAntoea Will need to do workup to  see for any source or deep infection- A PET scan may be helpful,   cannot be done as in patient- will get a tagged WBC scan tomorrow Continue cefepime   Anemia  Low cortisol- management as per primary team  ADHD- on adderall ? ?past h/o hysterectomy complicated by anterior abd wall hematoma with bleeding from inferior epigastric vessels needing coil embolization in feb 2021  Discussed the mnagement with patient and hospitalist

## 2022-08-17 NOTE — Progress Notes (Signed)
PROGRESS NOTE    MARISELA LINE  OIN:[id number] DOB: May 23, 1966 DOA: 08/11/2022 PCP: Sandi Mariscal, MD    Brief Narrative:  56 year old female who presents for evaluation after syncopal event.  Unclear etiology of syncope.  Orthostatics negative.  At this time unable to exclude infection.  Patient was previously on long-term IV antibiotics for bacteremia.  Source at that time was unclear.  Procalcitonin markedly elevated.  Urinalysis not indicative of infection, chest x-ray negative.  Blood cultures taken on admission    Now growing Enterobacter species.  Vancomycin discontinued.  Cefepime continued. Infectious disease consulted.  Recommendations appreciated.  Nuclear medicine tagged white cell scan ordered.  Study scheduled for Tuesday 10/24.     Assessment & Plan:   Principal Problem:   Syncope Active Problems:   Sepsis ([place])   AKI (acute kidney injury) ([place])   Alcoholism ([place])   Seizure ([place])   Hypotension   Bacteremia due to Gram-negative bacteria  Syncope No recurrent episodes.   Home Cymbalta and Flexeril and pain regimen have been held.   Orthostatics negative. Plan: Therapy as tolerated Fall precautions Okay for home pain regimen   Sepsis Gram negative bacteremia Patient presented with markedly elevated procalcitonin.  No clear source of infection.  Patient has a recent long-term antibiotic course for bacteremia of unclear etiology.  Current blood cultures positive for Pantoea.  Lactobacillus growing in the urine, questionable contaminant.  CT soft tissue neck negative for abscess or dental carry Plan: Continue cefepime Tagged white cell scan ordered.  Discussed with nuclear medicine technician.  Plan for study 10/24. Monitor vitals and fever curve ID aware and following   Acute kidney injury Resolved.  Creatinine back to baseline.  IV fluids stopped   History of seizure disorder No antiepileptics noted on home [facility] Outpatient follow-up   Alcohol use  disorder Daily thiamine  DVT prophylaxis: SQ heparin Code Status: Full Family Communication: Husband via phone 10/21 Disposition Plan: Status is: Inpatient Remains inpatient appropriate because: Bacteremia of unclear etiology on IV antibiotics   Level of care: Med-Surg  Consultants:  ID  Procedures:  None  Antimicrobials: Cefepime   Subjective: Seen and examined.  Resting comfortably in bed.  No visible distress.  No pain complaints.  Objective: Vitals:   08/16/22 2100 08/17/22 0500 08/17/22 0543 08/17/22 0700  BP:   121/70 97/63  Pulse:   74 74  Resp: 19   17  Temp:   97.9 F (36.6 C) 97.6 F (36.4 C)  TempSrc:    Oral  SpO2:   98% 98%  Weight:  60.9 kg    Height:        Intake/Output Summary (Last 24 hours) at 08/17/2022 1121 Last data filed at 08/17/2022 1000 Gross per 24 hour  Intake 170 ml  Output --  Net 170 ml   Filed Weights   08/15/22 0500 08/16/22 0500 08/17/22 0500  Weight: 55.3 kg 60.6 kg 60.9 kg    Examination:  General exam: No acute distress Respiratory system: Lungs clear.  Normal work of breathing.  Room air Cardiovascular system: S1-S2, RRR, no murmurs, no pedal edema Gastrointestinal system: Thin, soft, NT/ND, normal bowel sounds Central nervous system: Alert and oriented. No focal neurological deficits. Extremities: Symmetric 5 x 5 power. Skin: Scattered excoriations on face.  Poor dentition. Psychiatry: Judgement and insight appear normal. Mood & affect appropriate.     Data Reviewed: I have personally reviewed following labs and imaging studies  CBC: Recent Labs  Lab 08/11/22 1602 08/13/22  0445 08/17/22 0927  WBC 12.2* 5.4 5.2  NEUTROABS  --  2.3 2.3  HGB 11.3* 9.4* 12.2  HCT 34.7* 28.5* 37.5  MCV 88.1 89.3 90.4  PLT 331 149* 500   Basic Metabolic Panel: Recent Labs  Lab 08/11/22 1602 08/13/22 0445 08/14/22 0504 08/15/22 0841  NA 136 139 139  --   K 3.9 4.2 3.8  --   CL 109 109 103  --   CO2 18* 24 28  --    GLUCOSE 96 95 86  --   BUN '16 14 15  '$ --   CREATININE 1.00 0.82 0.90 0.91  CALCIUM 8.9 8.7* 8.7*  --    GFR: Estimated Creatinine Clearance: 57.1 mL/min (by C-G formula based on SCr of 0.91 mg/dL). Liver Function Tests: Recent Labs  Lab 08/12/22 0210  AST 26  ALT 15  ALKPHOS 62  BILITOT 0.9  PROT 6.4*  ALBUMIN 3.1*   No results for input(s): "LIPASE", "AMYLASE" in the last 168 hours. No results for input(s): "AMMONIA" in the last 168 hours. Coagulation Profile: No results for input(s): "INR", "PROTIME" in the last 168 hours. Cardiac Enzymes: Recent Labs  Lab 08/12/22 0210  CKTOTAL 45   BNP (last 3 results) No results for input(s): "PROBNP" in the last 8760 hours. HbA1C: No results for input(s): "HGBA1C" in the last 72 hours. CBG: Recent Labs  Lab 08/15/22 0805 08/15/22 1143 08/15/22 1536 08/16/22 0534 08/17/22 0547  GLUCAP 93 74 120* 94 93   Lipid Profile: No results for input(s): "CHOL", "HDL", "LDLCALC", "TRIG", "CHOLHDL", "LDLDIRECT" in the last 72 hours. Thyroid Function Tests: No results for input(s): "TSH", "T4TOTAL", "FREET4", "T3FREE", "THYROIDAB" in the last 72 hours. Anemia Panel: No results for input(s): "VITAMINB12", "FOLATE", "FERRITIN", "TIBC", "IRON", "RETICCTPCT" in the last 72 hours. Sepsis Labs: Recent Labs  Lab 08/11/22 1602 08/11/22 2336 08/12/22 0210 08/13/22 0445 08/14/22 0504  PROCALCITON  --   --  15.48 8.33 5.27  LATICACIDVEN 2.6* 1.5  --   --   --     Recent Results (from the past 240 hour(s))  Resp Panel by RT-PCR (Flu A&B, Covid) Anterior Nasal Swab     Status: None   Collection Time: 08/11/22  4:01 PM   Specimen: Anterior Nasal Swab  Result Value Ref Range Status   SARS Coronavirus 2 by RT PCR NEGATIVE NEGATIVE Final    Comment: (NOTE) SARS-CoV-2 target nucleic acids are NOT DETECTED.  The SARS-CoV-2 RNA is generally detectable in upper respiratory specimens during the acute phase of infection. The  lowest concentration of SARS-CoV-2 viral copies this assay can detect is 138 copies/mL. A negative result does not preclude SARS-Cov-2 infection and should not be used as the sole basis for treatment or other patient management decisions. A negative result may occur with  improper specimen collection/handling, submission of specimen other than nasopharyngeal swab, presence of viral mutation(s) within the areas targeted by this assay, and inadequate number of viral copies(<138 copies/mL). A negative result must be combined with clinical observations, patient history, and epidemiological information. The expected result is Negative.  Fact Sheet for Patients:  EntrepreneurPulse.com.au  Fact Sheet for Healthcare Providers:  IncredibleEmployment.be  This test is no t yet approved or cleared by the [place] FDA and  has been authorized for detection and/or diagnosis of SARS-CoV-2 by FDA under an Emergency Use Authorization (EUA). This EUA will remain  in effect (meaning this test can be used) for the duration of the COVID-19 declaration under Section 564(b)(1)  of the Act, 21 U.S.C.section 360bbb-3(b)(1), unless the authorization is terminated  or revoked sooner.       Influenza A by PCR NEGATIVE NEGATIVE Final   Influenza B by PCR NEGATIVE NEGATIVE Final    Comment: (NOTE) The Xpert Xpress SARS-CoV-2/FLU/RSV plus assay is intended as an aid in the diagnosis of influenza from Nasopharyngeal swab specimens and should not be used as a sole basis for treatment. Nasal washings and aspirates are unacceptable for Xpert Xpress SARS-CoV-2/FLU/RSV testing.  Fact Sheet for Patients: EntrepreneurPulse.com.au  Fact Sheet for Healthcare Providers: IncredibleEmployment.be  This test is not yet approved or cleared by the [place] FDA and has been authorized for detection and/or diagnosis of SARS-CoV-2 by FDA under  an Emergency Use Authorization (EUA). This EUA will remain in effect (meaning this test can be used) for the duration of the COVID-19 declaration under Section 564(b)(1) of the Act, 21 U.S.C. section 360bbb-3(b)(1), unless the authorization is terminated or revoked.  Performed at [facility], [address] [place], [place] [address]   Blood culture (single)     Status: Abnormal   Collection Time: 08/11/22  4:02 PM   Specimen: BLOOD  Result Value Ref Range Status   Specimen Description   Final    BLOOD BLOOD RIGHT HAND Performed at [facility], [address], [place], [place] [address]    Special Requests   Final    BOTTLES DRAWN AEROBIC AND ANAEROBIC Blood Culture adequate volume Performed at [facility], [address] [place], [place] [address]    Culture  Setup Time   Final    GRAM NEGATIVE RODS AEROBIC BOTTLE ONLY CRITICAL RESULT CALLED TO, READ BACK BY AND VERIFIED WITH: CAROLINE COULTER ON 08/15/22 AT 1038 QSD CRITICAL VALUE NOTED.  VALUE IS CONSISTENT WITH PREVIOUSLY REPORTED AND CALLED VALUE.    Culture (A)  Final    PANTOEA SPECIES SUSCEPTIBILITIES PERFORMED ON PREVIOUS CULTURE WITHIN THE LAST 5 DAYS. Performed at [facility], [place] [address], [place], [place] [address]    Report Status 08/17/2022 FINAL  Final  Urine Culture     Status: Abnormal   Collection Time: 08/11/22  4:02 PM   Specimen: Urine, Random  Result Value Ref Range Status   Specimen Description   Final    URINE, RANDOM Performed at [facility], [address], [place], [place] [address]    Special Requests   Final    NONE Performed at [facility], [address] [place], [place] [address]    Culture (A)  Final    30,000 COLONIES/mL LACTOBACILLUS SPECIES Standardized susceptibility testing for this organism is not available. Performed at [facility], [place] [address] [place], [place] [address]    Report Status 08/13/2022  FINAL  Final  Blood Culture (routine x 2)     Status: Abnormal   Collection Time: 08/11/22 10:41 PM   Specimen: BLOOD  Result Value Ref Range Status   Specimen Description   Final    BLOOD LEFT ANTECUBITAL Performed at [facility], [address], [place],  [address]    Special Requests   Final    BOTTLES DRAWN AEROBIC AND ANAEROBIC Blood Culture adequate volume Performed at [facility], [address] [place],  [address]    Culture  Setup Time   Final    Organism ID to follow GRAM NEGATIVE RODS ANAEROBIC BOTTLE ONLY CRITICAL RESULT CALLED TO, READ BACK BY AND VERIFIED WITH:  NATHAN BELUE AT 2239 ON 08/12/22 BY SS Performed at [facility], [address] [place], [place] [address]    Culture PANTOEA SPECIES (A)  Final   Report Status 08/15/2022 FINAL  Final   Organism ID, Bacteria PANTOEA SPECIES  Final      Susceptibility   Pantoea species - MIC*    CEFAZOLIN >=64 RESISTANT Resistant     CEFEPIME <=0.12 SENSITIVE Sensitive     CEFTAZIDIME <=1 SENSITIVE Sensitive     CEFTRIAXONE <=0.25 SENSITIVE Sensitive     CIPROFLOXACIN <=0.25 SENSITIVE Sensitive     GENTAMICIN <=1 SENSITIVE Sensitive     IMIPENEM <=0.25 SENSITIVE Sensitive     TRIMETH/SULFA <=20 SENSITIVE Sensitive     PIP/TAZO <=4 SENSITIVE Sensitive     * PANTOEA SPECIES  Blood Culture ID Panel (Reflexed)     Status: Abnormal   Collection Time: 08/11/22 10:41 PM  Result Value Ref Range Status   Enterococcus faecalis NOT DETECTED NOT DETECTED Final   Enterococcus Faecium NOT DETECTED NOT DETECTED Final   Listeria monocytogenes NOT DETECTED NOT DETECTED Final   Staphylococcus species NOT DETECTED NOT DETECTED Final   Staphylococcus aureus (BCID) NOT DETECTED NOT DETECTED Final   Staphylococcus epidermidis NOT DETECTED NOT DETECTED Final   Staphylococcus lugdunensis NOT DETECTED NOT DETECTED Final   Streptococcus species NOT DETECTED NOT DETECTED Final   Streptococcus  agalactiae NOT DETECTED NOT DETECTED Final   Streptococcus pneumoniae NOT DETECTED NOT DETECTED Final   Streptococcus pyogenes NOT DETECTED NOT DETECTED Final   A.calcoaceticus-baumannii NOT DETECTED NOT DETECTED Final   Bacteroides fragilis NOT DETECTED NOT DETECTED Final   Enterobacterales DETECTED (A) NOT DETECTED Final    Comment: Enterobacterales represent a large order of gram negative bacteria, not a single organism. Refer to culture for further identification. CRITICAL RESULT CALLED TO, READ BACK BY AND VERIFIED WITH: NATHAN BELUE AT 2239 ON 08/12/22 BY SS    Enterobacter cloacae complex NOT DETECTED NOT DETECTED Final   Escherichia coli NOT DETECTED NOT DETECTED Final   Klebsiella aerogenes NOT DETECTED NOT DETECTED Final   Klebsiella oxytoca NOT DETECTED NOT DETECTED Final   Klebsiella pneumoniae NOT DETECTED NOT DETECTED Final   Proteus species NOT DETECTED NOT DETECTED Final   Salmonella species NOT DETECTED NOT DETECTED Final   Serratia marcescens NOT DETECTED NOT DETECTED Final   Haemophilus influenzae NOT DETECTED NOT DETECTED Final   Neisseria meningitidis NOT DETECTED NOT DETECTED Final   Pseudomonas aeruginosa NOT DETECTED NOT DETECTED Final   Stenotrophomonas maltophilia NOT DETECTED NOT DETECTED Final   Candida albicans NOT DETECTED NOT DETECTED Final   Candida auris NOT DETECTED NOT DETECTED Final   Candida glabrata NOT DETECTED NOT DETECTED Final   Candida krusei NOT DETECTED NOT DETECTED Final   Candida parapsilosis NOT DETECTED NOT DETECTED Final   Candida tropicalis NOT DETECTED NOT DETECTED Final   Cryptococcus neoformans/gattii NOT DETECTED NOT DETECTED Final   CTX-M ESBL NOT DETECTED NOT DETECTED Final   Carbapenem resistance IMP NOT DETECTED NOT DETECTED Final   Carbapenem resistance KPC NOT DETECTED NOT DETECTED Final   Carbapenem resistance NDM NOT DETECTED NOT DETECTED Final   Carbapenem resist OXA 48 LIKE NOT DETECTED NOT DETECTED Final   Carbapenem  resistance VIM NOT DETECTED NOT DETECTED Final    Comment: Performed at [facility], [address] [place], [place] [address]  Culture, blood (Routine X 2) w Reflex to ID Panel     Status: None (Preliminary result)  Collection Time: 08/14/22 12:07 PM   Specimen: BLOOD  Result Value Ref Range Status   Specimen Description BLOOD BLOOD RIGHT WRIST  Final   Special Requests   Final    BOTTLES DRAWN AEROBIC AND ANAEROBIC Blood Culture adequate volume   Culture   Final    NO GROWTH 3 DAYS Performed at [facility], [address], [place], [place] [address]    Report Status PENDING  Incomplete  Culture, blood (Routine X 2) w Reflex to ID Panel     Status: None (Preliminary result)   Collection Time: 08/14/22 12:16 PM   Specimen: BLOOD  Result Value Ref Range Status   Specimen Description BLOOD RIGHT ANTECUBITAL  Final   Special Requests   Final    BOTTLES DRAWN AEROBIC AND ANAEROBIC Blood Culture adequate volume   Culture   Final    NO GROWTH 3 DAYS Performed at [facility], [address], [place], [place] [address]    Report Status PENDING  Incomplete         Radiology Studies: No results found.      Scheduled Meds:  amphetamine-dextroamphetamine  10 mg Oral BID   cyclobenzaprine  10 mg Oral TID   DULoxetine  30 mg Oral BID   gabapentin  300 mg Oral TID   heparin  5,000 Units Subcutaneous Q8H   pantoprazole (PROTONIX) IV  40 mg Intravenous Q12H   sodium chloride flush  3 mL Intravenous Q12H   thiamine (VITAMIN B1) injection  100 mg Intravenous Daily   Continuous Infusions:  ceFEPime (MAXIPIME) IV 2 g (08/17/22 0605)     LOS: 4 days    Sidney Ace, MD [facility]   If 7PM-7AM, please contact night-coverage  08/17/2022, 11:21 AM

## 2022-08-17 NOTE — Care Management Important Message (Signed)
Important Message  Patient Details  Name: [name] MRN: [id number] Date of Birth: [birth year]   Medicare Important Message Given:  Yes     Dannette Barbara 08/17/2022, 12:37 PM

## 2022-08-17 NOTE — Consult Note (Signed)
Pharmacy Antibiotic Note  [name] is a 56 y.o. female admitted on 08/11/2022 with sepsis. Presented after a syncopal episode with lightheadedness, tremors, chills, weakness/fatigue, transient chest pressure and SOB. She was discharged from [facility] on 07/13/2022 following treatment for Pantoea bacteremia (~14 day tx with cefepime >> ceftriaxone >> bactrim). Questionable compliance to bactrim outpatient as patient reported GI upset after 2 doses. Patient was encouraged to resume bactrim at outpatient follow-up with ID physician. Pharmacy has been consulted for Cefepime dosing.  Plan: Day 6 of antibiotics Continue Cefepime from 2g IV Q8H Continue to monitor renal function and follow culture results  Height: '5\' 1"'$  (154.9 cm) Weight: 60.9 kg (134 lb 4.2 oz) IBW/kg (Calculated) : 47.8  Temp (24hrs), Avg:98.1 F (36.7 C), Min:97.8 F (36.6 C), Max:98.4 F (36.9 C)  Recent Labs  Lab 08/11/22 1602 08/11/22 2336 08/13/22 0445 08/14/22 0504 08/15/22 0841  WBC 12.2*  --  5.4  --   --   CREATININE 1.00  --  0.82 0.90 0.91  LATICACIDVEN 2.6* 1.5  --   --   --      Estimated Creatinine Clearance: 57.1 mL/min (by C-G formula based on SCr of 0.91 mg/dL).    Allergies  Allergen Reactions   Naproxen Anaphylaxis   Morphine And Related Itching    Antimicrobials this admission: 10/17 Vancomycin >> 10/18 10/17 Cefepime >>   Microbiology results: 10/17 BCx: 1 of 4 (anaerobic) growing GNR, BCID Enterobacterales 10/17 UCx: 30k Lactobacillus spp 10/20 BCx: NG x 3 days  Thank you for allowing pharmacy to be a part of this patient's care.  Darnelle Bos, PharmD 08/17/2022 7:12 AM

## 2022-08-18 ENCOUNTER — Inpatient Hospital Stay: Payer: Medicare Other

## 2022-08-18 DIAGNOSIS — R7881 Bacteremia: Secondary | ICD-10-CM | POA: Diagnosis not present

## 2022-08-18 DIAGNOSIS — R55 Syncope and collapse: Secondary | ICD-10-CM | POA: Diagnosis not present

## 2022-08-18 LAB — CREATININE, SERUM
Creatinine, Ser: 0.87 mg/dL (ref 0.44–1.00)
GFR, Estimated: >60 mL/min (ref >60)

## 2022-08-18 MED ORDER — TECHNETIUM TC 99M EXAMETAZIME IV KIT
10.7400 | PACK | Freq: Once | INTRAVENOUS | Status: AC | PRN
  Administered 2022-08-18: 10.74 via INTRAVENOUS

## 2022-08-18 NOTE — Plan of Care (Signed)

## 2022-08-18 NOTE — Progress Notes (Signed)
Date of Admission:  08/11/2022     ID: [name] is a 56 y.o. female Principal Problem:   Syncope Active Problems:   Alcoholism ([place])   Seizure ([place])   Hypotension   Sepsis ([place])   AKI (acute kidney injury) ([place])   Bacteremia due to Gram-negative bacteria    Subjective: Pt has no specific complaints Finished  Tagged wbc scan today  Medications:   amphetamine-dextroamphetamine  10 mg Oral BID   cyclobenzaprine  10 mg Oral TID   DULoxetine  30 mg Oral BID   gabapentin  300 mg Oral TID   heparin  5,000 Units Subcutaneous Q8H   pantoprazole (PROTONIX) IV  40 mg Intravenous Q12H   sodium chloride flush  3 mL Intravenous Q12H   thiamine (VITAMIN B1) injection  100 mg Intravenous Daily    Objective: Vital signs in last 24 hours: Temp:  [97.6 F (36.4 C)-98.4 F (36.9 C)] 98 F (36.7 C) (10/24 1612) Pulse Rate:  [73-78] 78 (10/24 1620) Resp:  [16-18] 18 (10/24 1612) BP: (90-115)/(50-76) 115/76 (10/24 1620) SpO2:  [98 %-100 %] 98 % (10/24 1612) Weight:  [60.3 kg] 60.3 kg (10/24 0413)    PHYSICAL EXAM:  General: Alert, cooperative, no distress, appears stated age.  Face multiple excoriations -Lungs: Clear to auscultation bilaterally. No Wheezing or Rhonchi. No rales. Heart: Regular rate and rhythm, no murmur, rub or gallop. Abdomen: Soft, non-tender,not distended. Bowel sounds normal. No masses Extremities: atraumatic, no cyanosis. No edema. No clubbing Skin: No rashes or lesions. Or bruising Lymph: Cervical, supraclavicular normal. Neurologic: Grossly non-focal  Lab Results Recent Labs    08/17/22 0927 08/18/22 0357  WBC 5.2  --   HGB 12.2  --   HCT 37.5  --   CREATININE  --  0.87      Microbiology: 08/11/22 BC- pantoea 08/14/22 BC- NG so far Studies/Results: NM WBC SCAN TUMOR LOC LIMITED  Result Date: 08/18/2022 CLINICAL DATA:  Gram-negative bacteremia of uncertain origin EXAM: NUCLEAR MEDICINE LEUKOCYTE SCAN TECHNIQUE: Following  intravenous administration of radiolabeled white blood cells, images of the head, neck, trunk, and extremities were obtained on subsequent days. RADIOPHARMACEUTICALS:  17.61 millicuries [id number] Ceretec labeled autologous leukocytes IV COMPARISON:  None Available. FINDINGS: Expected distribution of labeled leukocytes within spleen, liver, and bone marrow. No abnormal sites of labeled leukocyte localization are seen to suggest occult infection. IMPRESSION: Negative whole-body labeled leukocytes study. Electronically Signed   By: Lavonia Dana M.D.   On: 08/18/2022 16:19     Assessment/Plan: syncope  Chills High procal Recurrent Pantoea bacteremia Previoulsy in Sept 2023 with extensive work up ( TEE, MRI entire spine, CT abd/pelvis/chest/brain) were all negative She got 5 days of IV antibiotic while in  the hospital , repeat blood culture on 07/10/22 was negative. She was sent home on PO bactrim for 7 more days which she did not take, I saw her in my office on 07/30/22 and repeated blood culture which was negative. She got readmitted on 08/11/22 with syncope and blood culture positive again  Tagged WBC scan neg We will  have to repeat TEE  to look for vegetation Will keep patient NOP after midnight and inform cardiology Will then decide on duration of antibiotics and further tests including PET SCAN    Anemia  Low cortisol-   ADHD- on adderall ? ?past h/o hysterectomy complicated by ant abd wall hematoma with bleeding from inferior epigastric vessels needing coil embolization in feb 2021  Self inflicted facial  excoriations  Discussed the mnagement with patient and hospitalist

## 2022-08-18 NOTE — Progress Notes (Signed)
PROGRESS NOTE    [name]  CBJ:[id number] DOB: 09-13-1966 DOA: 08/11/2022 PCP: Sandi Mariscal, MD    Brief Narrative:  56 year old female who presents for evaluation after syncopal event.  Unclear etiology of syncope.  Orthostatics negative.  At this time unable to exclude infection.  Patient was previously on long-term IV antibiotics for bacteremia.  Source at that time was unclear.  Procalcitonin markedly elevated.  Urinalysis not indicative of infection, chest x-ray negative.  Blood cultures taken on admission    Now growing Enterobacter species.  Vancomycin discontinued.  Cefepime continued. Infectious disease consulted.  Recommendations appreciated.  Nuclear medicine tagged white cell scan ordered.  Study scheduled for Tuesday 10/24.     Assessment & Plan:   Principal Problem:   Syncope Active Problems:   Sepsis ([place])   AKI (acute kidney injury) ([place])   Alcoholism ([place])   Seizure ([place])   Hypotension   Bacteremia due to Gram-negative bacteria  Syncope No recurrent episodes.   Home Cymbalta and Flexeril and pain regimen have been held.   Orthostatics negative. Plan: Therapy as tolerated Fall precautions Okay for home pain regimen   Sepsis Gram negative bacteremia Patient presented with markedly elevated procalcitonin.  No clear source of infection.  Patient has a recent long-term antibiotic course for bacteremia of unclear etiology.  Current blood cultures positive for Pantoea.  Lactobacillus growing in the urine, questionable contaminant.  CT soft tissue neck negative for abscess or dental carry Plan: Continue cefepime Tagged WBC scan today Monitor vitals and fever curve ID aware and following If tagged white cell scan is unrevealing may need to referred to cancer center for outpatient PET scan   acute kidney injury Resolved.  Creatinine back to baseline.  IV fluids stopped   History of seizure disorder No antiepileptics noted on home [facility]  follow-up   Alcohol use disorder Daily thiamine  Low cortisol Noted on previous admission Unclear significance or relation to recurrent bacteremia Needs outpatient followup with endocrinology  DVT prophylaxis: SQ heparin Code Status: Full Family Communication: Husband via phone 10/21 Disposition Plan: Status is: Inpatient Remains inpatient appropriate because: Bacteremia of unclear etiology on IV antibiotics   Level of care: Med-Surg  Consultants:  ID  Procedures:  None  Antimicrobials: Cefepime   Subjective: Seen and examined.  Resting comfortably in bed.  No visible distress.  No pain complaints.  Objective: Vitals:   08/17/22 2016 08/18/22 0413 08/18/22 0420 08/18/22 0807  BP: 111/61  (!) 99/55 109/61  Pulse: 78  73 74  Resp: '16  16 18  '$ Temp: 98.4 F (36.9 C)  97.6 F (36.4 C) 98.4 F (36.9 C)  TempSrc:      SpO2: 100%  100% 100%  Weight:  60.3 kg    Height:        Intake/Output Summary (Last 24 hours) at 08/18/2022 1054 Last data filed at 08/17/2022 1900 Gross per 24 hour  Intake 120 ml  Output --  Net 120 ml   Filed Weights   08/16/22 0500 08/17/22 0500 08/18/22 0413  Weight: 60.6 kg 60.9 kg 60.3 kg    Examination:  General exam: NAD Respiratory system: Lungs clear.  Normal work of breathing.  Room air Cardiovascular system: S1-S2, RRR, no murmurs, no pedal edema Gastrointestinal system: Thin, soft, NT/ND, normal bowel sounds Central nervous system: Alert and oriented. No focal neurological deficits. Extremities: Symmetric 5 x 5 power. Skin: Scattered excoriations on face.  Poor dentition.  No other rashes or lesions noted  Psychiatry: Judgement and insight appear normal. Mood & affect appropriate.     Data Reviewed: I have personally reviewed following labs and imaging studies  CBC: Recent Labs  Lab 08/11/22 1602 08/13/22 0445 08/17/22 0927  WBC 12.2* 5.4 5.2  NEUTROABS  --  2.3 2.3  HGB 11.3* 9.4* 12.2  HCT 34.7* 28.5* 37.5   MCV 88.1 89.3 90.4  PLT 331 149* 419   Basic Metabolic Panel: Recent Labs  Lab 08/11/22 1602 08/13/22 0445 08/14/22 0504 08/15/22 0841 08/18/22 0357  NA 136 139 139  --   --   K 3.9 4.2 3.8  --   --   CL 109 109 103  --   --   CO2 18* 24 28  --   --   GLUCOSE 96 95 86  --   --   BUN '16 14 15  '$ --   --   CREATININE 1.00 0.82 0.90 0.91 0.87  CALCIUM 8.9 8.7* 8.7*  --   --    GFR: Estimated Creatinine Clearance: 59.5 mL/min (by C-G formula based on SCr of 0.87 mg/dL). Liver Function Tests: Recent Labs  Lab 08/12/22 0210  AST 26  ALT 15  ALKPHOS 62  BILITOT 0.9  PROT 6.4*  ALBUMIN 3.1*   No results for input(s): "LIPASE", "AMYLASE" in the last 168 hours. No results for input(s): "AMMONIA" in the last 168 hours. Coagulation Profile: No results for input(s): "INR", "PROTIME" in the last 168 hours. Cardiac Enzymes: Recent Labs  Lab 08/12/22 0210  CKTOTAL 45   BNP (last 3 results) No results for input(s): "PROBNP" in the last 8760 hours. HbA1C: No results for input(s): "HGBA1C" in the last 72 hours. CBG: Recent Labs  Lab 08/15/22 1143 08/15/22 1536 08/16/22 0534 08/17/22 0547 08/17/22 1946  GLUCAP 74 120* 94 93 96   Lipid Profile: No results for input(s): "CHOL", "HDL", "LDLCALC", "TRIG", "CHOLHDL", "LDLDIRECT" in the last 72 hours. Thyroid Function Tests: No results for input(s): "TSH", "T4TOTAL", "FREET4", "T3FREE", "THYROIDAB" in the last 72 hours. Anemia Panel: No results for input(s): "VITAMINB12", "FOLATE", "FERRITIN", "TIBC", "IRON", "RETICCTPCT" in the last 72 hours. Sepsis Labs: Recent Labs  Lab 08/11/22 1602 08/11/22 2336 08/12/22 0210 08/13/22 0445 08/14/22 0504  PROCALCITON  --   --  15.48 8.33 5.27  LATICACIDVEN 2.6* 1.5  --   --   --     Recent Results (from the past 240 hour(s))  Resp Panel by RT-PCR (Flu A&B, Covid) Anterior Nasal Swab     Status: None   Collection Time: 08/11/22  4:01 PM   Specimen: Anterior Nasal Swab  Result  Value Ref Range Status   SARS Coronavirus 2 by RT PCR NEGATIVE NEGATIVE Final    Comment: (NOTE) SARS-CoV-2 target nucleic acids are NOT DETECTED.  The SARS-CoV-2 RNA is generally detectable in upper respiratory specimens during the acute phase of infection. The lowest concentration of SARS-CoV-2 viral copies this assay can detect is 138 copies/mL. A negative result does not preclude SARS-Cov-2 infection and should not be used as the sole basis for treatment or other patient management decisions. A negative result may occur with  improper specimen collection/handling, submission of specimen other than nasopharyngeal swab, presence of viral mutation(s) within the areas targeted by this assay, and inadequate number of viral copies(<138 copies/mL). A negative result must be combined with clinical observations, patient history, and epidemiological information. The expected result is Negative.  Fact Sheet for Patients:  EntrepreneurPulse.com.au  Fact Sheet for Healthcare Providers:  IncredibleEmployment.be  This test is no t yet approved or cleared by the [place] and  has been authorized for detection and/or diagnosis of SARS-CoV-2 by FDA under an Emergency Use Authorization (EUA). This EUA will remain  in effect (meaning this test can be used) for the duration of the COVID-19 declaration under Section 564(b)(1) of the Act, 21 U.S.C.section 360bbb-3(b)(1), unless the authorization is terminated  or revoked sooner.       Influenza A by PCR NEGATIVE NEGATIVE Final   Influenza B by PCR NEGATIVE NEGATIVE Final    Comment: (NOTE) The Xpert Xpress SARS-CoV-2/FLU/RSV plus assay is intended as an aid in the diagnosis of influenza from Nasopharyngeal swab specimens and should not be used as a sole basis for treatment. Nasal washings and aspirates are unacceptable for Xpert Xpress SARS-CoV-2/FLU/RSV testing.  Fact Sheet for  Patients: EntrepreneurPulse.com.au  Fact Sheet for Healthcare Providers: IncredibleEmployment.be  This test is not yet approved or cleared by the [place] FDA and has been authorized for detection and/or diagnosis of SARS-CoV-2 by FDA under an Emergency Use Authorization (EUA). This EUA will remain in effect (meaning this test can be used) for the duration of the COVID-19 declaration under Section 564(b)(1) of the Act, 21 U.S.C. section 360bbb-3(b)(1), unless the authorization is terminated or revoked.  Performed at [facility], [address] [place], [place] [address]   Blood culture (single)     Status: Abnormal   Collection Time: 08/11/22  4:02 PM   Specimen: BLOOD  Result Value Ref Range Status   Specimen Description   Final    BLOOD BLOOD RIGHT HAND Performed at [facility], [address], [place], [place] [address]    Special Requests   Final    BOTTLES DRAWN AEROBIC AND ANAEROBIC Blood Culture adequate volume Performed at [facility], [address] [place], [place] [address]    Culture  Setup Time   Final    GRAM NEGATIVE RODS AEROBIC BOTTLE ONLY CRITICAL RESULT CALLED TO, READ BACK BY AND VERIFIED WITH: CAROLINE COULTER ON 08/15/22 AT 1038 QSD CRITICAL VALUE NOTED.  VALUE IS CONSISTENT WITH PREVIOUSLY REPORTED AND CALLED VALUE.    Culture (A)  Final    PANTOEA SPECIES SUSCEPTIBILITIES PERFORMED ON PREVIOUS CULTURE WITHIN THE LAST 5 DAYS. Performed at [facility], [place] [address], [place], [place] [address]    Report Status 08/17/2022 FINAL  Final  Urine Culture     Status: Abnormal   Collection Time: 08/11/22  4:02 PM   Specimen: Urine, Random  Result Value Ref Range Status   Specimen Description   Final    URINE, RANDOM Performed at [facility], [address], [place], [place] [address]    Special Requests   Final    NONE Performed at [facility], [address] [place], [place] [address]    Culture (A)  Final    30,000 COLONIES/mL LACTOBACILLUS SPECIES Standardized susceptibility testing for this organism is not available. Performed at [facility], [place] [address], [place], [place] [address]    Report Status 08/13/2022 FINAL  Final  Blood Culture (routine x 2)     Status: Abnormal   Collection Time: 08/11/22 10:41 PM   Specimen: BLOOD  Result Value Ref Range Status   Specimen Description   Final    BLOOD LEFT ANTECUBITAL Performed at [facility], [address], [place], [place] [address]    Special Requests  Final    BOTTLES DRAWN AEROBIC AND ANAEROBIC Blood Culture adequate volume Performed at [facility], [address] [place], [place] [address]    Culture  Setup Time   Final    Organism ID to follow GRAM NEGATIVE RODS ANAEROBIC BOTTLE ONLY CRITICAL RESULT CALLED TO, READ BACK BY AND VERIFIED WITH: NATHAN BELUE AT 2239 ON 08/12/22 BY SS Performed at [facility], [address] [place], [place] [address]    Culture PANTOEA SPECIES (A)  Final   Report Status 08/15/2022 FINAL  Final   Organism ID, Bacteria PANTOEA SPECIES  Final      Susceptibility   Pantoea species - MIC*    CEFAZOLIN >=64 RESISTANT Resistant     CEFEPIME <=0.12 SENSITIVE Sensitive     CEFTAZIDIME <=1 SENSITIVE Sensitive     CEFTRIAXONE <=0.25 SENSITIVE Sensitive     CIPROFLOXACIN <=0.25 SENSITIVE Sensitive     GENTAMICIN <=1 SENSITIVE Sensitive     IMIPENEM <=0.25 SENSITIVE Sensitive     TRIMETH/SULFA <=20 SENSITIVE Sensitive     PIP/TAZO <=4 SENSITIVE Sensitive     * PANTOEA SPECIES  Blood Culture ID Panel (Reflexed)     Status: Abnormal   Collection Time: 08/11/22 10:41 PM  Result Value Ref Range Status   Enterococcus faecalis NOT DETECTED NOT DETECTED Final   Enterococcus Faecium NOT DETECTED NOT DETECTED Final   Listeria monocytogenes NOT DETECTED NOT DETECTED Final   Staphylococcus species NOT  DETECTED NOT DETECTED Final   Staphylococcus aureus (BCID) NOT DETECTED NOT DETECTED Final   Staphylococcus epidermidis NOT DETECTED NOT DETECTED Final   Staphylococcus lugdunensis NOT DETECTED NOT DETECTED Final   Streptococcus species NOT DETECTED NOT DETECTED Final   Streptococcus agalactiae NOT DETECTED NOT DETECTED Final   Streptococcus pneumoniae NOT DETECTED NOT DETECTED Final   Streptococcus pyogenes NOT DETECTED NOT DETECTED Final   A.calcoaceticus-baumannii NOT DETECTED NOT DETECTED Final   Bacteroides fragilis NOT DETECTED NOT DETECTED Final   Enterobacterales DETECTED (A) NOT DETECTED Final    Comment: Enterobacterales represent a large order of gram negative bacteria, not a single organism. Refer to culture for further identification. CRITICAL RESULT CALLED TO, READ BACK BY AND VERIFIED WITH: NATHAN BELUE AT 2239 ON 08/12/22 BY SS    Enterobacter cloacae complex NOT DETECTED NOT DETECTED Final   Escherichia coli NOT DETECTED NOT DETECTED Final   Klebsiella aerogenes NOT DETECTED NOT DETECTED Final   Klebsiella oxytoca NOT DETECTED NOT DETECTED Final   Klebsiella pneumoniae NOT DETECTED NOT DETECTED Final   Proteus species NOT DETECTED NOT DETECTED Final   Salmonella species NOT DETECTED NOT DETECTED Final   Serratia marcescens NOT DETECTED NOT DETECTED Final   Haemophilus influenzae NOT DETECTED NOT DETECTED Final   Neisseria meningitidis NOT DETECTED NOT DETECTED Final   Pseudomonas aeruginosa NOT DETECTED NOT DETECTED Final   Stenotrophomonas maltophilia NOT DETECTED NOT DETECTED Final   Candida albicans NOT DETECTED NOT DETECTED Final   Candida auris NOT DETECTED NOT DETECTED Final   Candida glabrata NOT DETECTED NOT DETECTED Final   Candida krusei NOT DETECTED NOT DETECTED Final   Candida parapsilosis NOT DETECTED NOT DETECTED Final   Candida tropicalis NOT DETECTED NOT DETECTED Final   Cryptococcus neoformans/gattii NOT DETECTED NOT DETECTED Final   CTX-M ESBL NOT  DETECTED NOT DETECTED Final   Carbapenem resistance IMP NOT DETECTED NOT DETECTED Final   Carbapenem resistance KPC NOT DETECTED NOT DETECTED Final   Carbapenem resistance NDM NOT DETECTED NOT DETECTED Final  Carbapenem resist OXA 48 LIKE NOT DETECTED NOT DETECTED Final   Carbapenem resistance VIM NOT DETECTED NOT DETECTED Final    Comment: Performed at [facility], [address] [place], [place] [address]  Culture, blood (Routine X 2) w Reflex to ID Panel     Status: None (Preliminary result)   Collection Time: 08/14/22 12:07 PM   Specimen: BLOOD  Result Value Ref Range Status   Specimen Description BLOOD BLOOD RIGHT WRIST  Final   Special Requests   Final    BOTTLES DRAWN AEROBIC AND ANAEROBIC Blood Culture adequate volume   Culture   Final    NO GROWTH 3 DAYS Performed at [facility], [address] [place], [place] [address]    Report Status PENDING  Incomplete  Culture, blood (Routine X 2) w Reflex to ID Panel     Status: None (Preliminary result)   Collection Time: 08/14/22 12:16 PM   Specimen: BLOOD  Result Value Ref Range Status   Specimen Description BLOOD RIGHT ANTECUBITAL  Final   Special Requests   Final    BOTTLES DRAWN AEROBIC AND ANAEROBIC Blood Culture adequate volume   Culture   Final    NO GROWTH 3 DAYS Performed at [facility], [address] [place], [place] [address]    Report Status PENDING  Incomplete         Radiology Studies: No results found.      Scheduled Meds:  amphetamine-dextroamphetamine  10 mg Oral BID   cyclobenzaprine  10 mg Oral TID   DULoxetine  30 mg Oral BID   gabapentin  300 mg Oral TID   heparin  5,000 Units Subcutaneous Q8H   pantoprazole (PROTONIX) IV  40 mg Intravenous Q12H   sodium chloride flush  3 mL Intravenous Q12H   thiamine (VITAMIN B1) injection  100 mg Intravenous Daily   Continuous Infusions:  ceFEPime (MAXIPIME) IV 2 g (08/18/22 0515)     LOS: 5 days    Sidney Ace, MD [facility]   If 7PM-7AM, please contact night-coverage  08/18/2022, 10:54 AM

## 2022-08-19 ENCOUNTER — Inpatient Hospital Stay (HOSPITAL_COMMUNITY)
Admit: 2022-08-19 | Discharge: 2022-08-19 | Disposition: A | Discharge status: Home / Self Care | Payer: Medicare Other | Attending: Physician Assistant | Admitting: Physician Assistant

## 2022-08-19 ENCOUNTER — Encounter: Payer: Self-pay | Admitting: Internal Medicine

## 2022-08-19 ENCOUNTER — Other Ambulatory Visit: Payer: Self-pay | Admitting: Infectious Diseases

## 2022-08-19 ENCOUNTER — Encounter
Admission: EM | Disposition: A | Discharge status: Home / Self Care | Payer: Self-pay | Source: Home / Self Care | Attending: Internal Medicine

## 2022-08-19 DIAGNOSIS — R55 Syncope and collapse: Secondary | ICD-10-CM | POA: Diagnosis not present

## 2022-08-19 DIAGNOSIS — I340 Nonrheumatic mitral (valve) insufficiency: Secondary | ICD-10-CM

## 2022-08-19 DIAGNOSIS — R7881 Bacteremia: Secondary | ICD-10-CM

## 2022-08-19 DIAGNOSIS — I351 Nonrheumatic aortic (valve) insufficiency: Secondary | ICD-10-CM

## 2022-08-19 DIAGNOSIS — N179 Acute kidney failure, unspecified: Secondary | ICD-10-CM | POA: Diagnosis not present

## 2022-08-19 DIAGNOSIS — A419 Sepsis, unspecified organism: Secondary | ICD-10-CM | POA: Diagnosis not present

## 2022-08-19 HISTORY — PX: TEE WITHOUT CARDIOVERSION: SHX5443

## 2022-08-19 LAB — CULTURE, BLOOD (ROUTINE X 2)
Culture: NO GROWTH
Culture: NO GROWTH
Special Requests: ADEQUATE
Special Requests: ADEQUATE

## 2022-08-19 LAB — C-REACTIVE PROTEIN: CRP: 0.6 mg/dL (ref <1.0)

## 2022-08-19 LAB — SEDIMENTATION RATE: Sed Rate: 48 mm/hr — ABNORMAL HIGH (ref 0–30)

## 2022-08-19 SURGERY — ECHOCARDIOGRAM, TRANSESOPHAGEAL
Anesthesia: Moderate Sedation

## 2022-08-19 MED ORDER — MIDAZOLAM HCL 2 MG/2ML IJ SOLN
INTRAMUSCULAR | Status: AC | PRN
  Administered 2022-08-19: 1 mg via INTRAVENOUS

## 2022-08-19 MED ORDER — SODIUM CHLORIDE 0.9 % IV SOLN: INTRAVENOUS | Status: DC

## 2022-08-19 MED ORDER — MIDAZOLAM HCL 5 MG/5ML IJ SOLN
INTRAMUSCULAR | Status: AC | PRN
  Administered 2022-08-19: 1 mg via INTRAVENOUS

## 2022-08-19 MED ORDER — FENTANYL CITRATE (PF) 100 MCG/2ML IJ SOLN
INTRAMUSCULAR | Status: AC | PRN
  Administered 2022-08-19 (×2): 25 ug via INTRAVENOUS

## 2022-08-19 MED ORDER — CIPROFLOXACIN HCL 500 MG PO TABS: 500.0000 mg | ORAL_TABLET | Freq: Two times a day (BID) | ORAL | 0 refills | Status: AC

## 2022-08-19 MED ORDER — LIDOCAINE VISCOUS HCL 2 % MT SOLN
OROMUCOSAL | Status: AC
  Administered 2022-08-19: 15 mL
  Filled 2022-08-19: qty 15

## 2022-08-19 MED ORDER — FENTANYL CITRATE (PF) 100 MCG/2ML IJ SOLN
INTRAMUSCULAR | Status: AC
  Filled 2022-08-19: qty 2

## 2022-08-19 MED ORDER — BUTAMBEN-TETRACAINE-BENZOCAINE 2-2-14 % EX AERO
INHALATION_SPRAY | CUTANEOUS | Status: AC
  Filled 2022-08-19: qty 5

## 2022-08-19 MED ORDER — MIDAZOLAM HCL 2 MG/2ML IJ SOLN
INTRAMUSCULAR | Status: AC
  Filled 2022-08-19: qty 4

## 2022-08-19 NOTE — Progress Notes (Signed)
To special procedures per bed for TEE.

## 2022-08-19 NOTE — Progress Notes (Signed)
   [facility] has been requested to perform a transesophageal echocardiogram on Terrance Mass for recurrent bacteremia in the context of antibiotic nonadherence.  After careful review of history and examination, the risks and benefits of transesophageal echocardiogram have been explained including risks of esophageal damage, perforation (1:10,000 risk), bleeding, pharyngeal hematoma as well as other potential complications associated with anesthesia including aspiration, arrhythmia, respiratory failure and death. Alternatives to treatment were discussed, questions were answered. Patient is willing to proceed.   Christell Faith, PA-C 08/19/2022 7:38 AM

## 2022-08-19 NOTE — Discharge Summary (Incomplete)
Physician Discharge Summary   Patient: [name] MRN: [id number] DOB: 12/25/1965  Admit date:     08/11/2022  Discharge date: {dischdate:26783}  Discharge Physician: Jennye Boroughs   PCP: Sandi Mariscal, MD   Recommendations at discharge:  {Tip this will not be part of the note when signed- Example include specific recommendations for outpatient follow-up, pending tests to follow-up on. (Optional):26781}  ***  Discharge Diagnoses: Principal Problem:   Syncope Active Problems:   Sepsis ([place])   AKI (acute kidney injury) ([place])   Alcoholism ([place])   Seizure ([place])   Hypotension   Bacteremia due to Gram-negative bacteria  Resolved Problems:   * No resolved hospital problems. [facility] Course: No notes on file  Assessment and Plan: * Syncope Patient is at risk for syncope as she is on Cymbalta in addition to flexeril putting her at risk for falls.  Fall precaution.   Sepsis ([place]) Pt meets SIRS criteria do not suspect infection. Pt has received IV abx due to lactic acid level. We will continue and follow.  MIVF LR at 75 cc/ hour.   AKI (acute kidney injury) [facility]) Lab Results  Component Value Date   CREATININE 1.00 08/11/2022   CREATININE 0.95 07/30/2022   CREATININE 0.78 07/13/2022  resolved.  We will follow and monitor.     Hypotension Vitals:   08/11/22 1553 08/11/22 2230 08/11/22 2335 08/12/22 0000  BP: 99/62 (!) 97/52 (!) 104/52 100/65   We will be cautious with meds that lower bp  MIVF overnight.  Type and screen. IV ppi.  Orthostatic negative. MRI brain pending.   Seizure ([place]) H?o seizure d/o no meds in chart.a dn to d/c her neurology to start keppra. eeg   Alcoholism ([place]) We will start pt on thiamine.  Ethanol level pending. Suspect AG from lactic acid level.         {Tip this will not be part of the note when signed Body mass index is 25.03 kg/m. , ,  (Optional):26781}  {(NOTE) Pain control PDMP Statment  (Optional):26782} Consultants: *** Procedures performed: ***  Disposition: {Plan; Disposition:26390} Diet recommendation:  Discharge Diet Orders (From admission, onward)     Start     Ordered   08/19/22 0000  Diet - low sodium heart healthy        08/19/22 1519           {Diet_Plan:26776} DISCHARGE MEDICATION: Allergies as of 08/19/2022       Reactions   Naproxen Anaphylaxis   Morphine And Related Itching        Medication List     TAKE these medications    acetaminophen 500 MG tablet Commonly known as: TYLENOL Take 500-1,000 mg by mouth every 6 (six) hours as needed for mild pain or headache.   amphetamine-dextroamphetamine 10 MG tablet Commonly known as: ADDERALL Take 10 mg by mouth 2 (two) times daily.   ciprofloxacin 500 MG tablet Commonly known as: Cipro Take 1 tablet (500 mg total) by mouth 2 (two) times daily for 28 days.   cyclobenzaprine 10 MG tablet Commonly known as: FLEXERIL Take 10 mg by mouth 3 (three) times daily as needed for muscle spasms.   DULoxetine 30 MG capsule Commonly known as: CYMBALTA Take 30 mg by mouth 2 (two) times daily.   EPINEPHrine 0.3 mg/0.3 mL Soaj injection Commonly known as: EPI-PEN Inject 0.3 mg into the muscle once as needed (severe allergic reaction).   gabapentin 300 MG capsule Commonly known as: NEURONTIN Take 300  mg by mouth 3 (three) times daily.   Oxycodone HCl 10 MG Tabs Take 10 mg by mouth 4 (four) times daily as needed (moderate to severe pain).        Follow-up Information     Tsosie Billing, MD. Schedule an appointment as soon as possible for a visit in 2 week(s).   Specialty: Infectious Diseases Contact information: [place] [address] [phone]                Discharge Exam: Danley Danker Weights   08/18/22 0413 08/19/22 0335 08/19/22 1200  Weight: 60.3 kg 60.1 kg 60.1 kg   ***  Condition at discharge: {DC Condition:26389}  The results of significant  diagnostics from this hospitalization (including imaging, microbiology, ancillary and laboratory) are listed below for reference.   Imaging Studies: ECHO TEE  Result Date: 08/19/2022    TRANSESOPHOGEAL ECHO REPORT   Patient Name:   [name] Date of Exam: 08/19/2022 Medical Rec #:  [id number]           Height:       61.0 in Accession #:    [id number]          Weight:       132.5 lb Date of Birth:  11/02/1965           BSA:          1.586 m Patient Age:    56 years            BP:           103/80 mmHg Patient Gender: F                   HR:           83 bpm. Exam Location:  [facility] Procedure: Transesophageal Echo, Cardiac Doppler and Color Doppler Indications:     bacteremia R78.81  History:         Patient has prior history of Echocardiogram examinations, most                  recent 07/13/2022. Stroke. Bacteremia, seizures.  Sonographer:     Sherrie Sport Referring Phys:  [id number] [place] Diagnosing Phys: Kate Sable MD PROCEDURE: The transesophogeal probe was passed without difficulty through the esophogus of the patient. Sedation performed by performing physician. The patient developed no complications during the procedure. IMPRESSIONS  1. Left ventricular ejection fraction, by estimation, is 60 to 65%. The left ventricle has normal function.  2. Right ventricular systolic function is normal. The right ventricular size is normal.  3. No left atrial/left atrial appendage thrombus was detected.  4. The mitral valve is normal in structure. Mild mitral valve regurgitation.  5. The aortic valve is tricuspid. Aortic valve regurgitation is mild. Conclusion(s)/Recommendation(s): No evidence of vegetation/infective endocarditis on this transesophageael echocardiogram. FINDINGS  Left Ventricle: Left ventricular ejection fraction, by estimation, is 60 to 65%. The left ventricle has normal function. The left ventricular internal cavity size was normal in size. Right Ventricle: The right ventricular size is  normal. No increase in right ventricular wall thickness. Right ventricular systolic function is normal. Left Atrium: Left atrial size was normal in size. No left atrial/left atrial appendage thrombus was detected. Right Atrium: Right atrial size was normal in size. Pericardium: There is no evidence of pericardial effusion. Mitral Valve: The mitral valve is normal in structure. Mild mitral valve regurgitation. Tricuspid Valve: The tricuspid valve is normal in structure. Tricuspid  valve regurgitation is trivial. Aortic Valve: The aortic valve is tricuspid. Aortic valve regurgitation is mild. Pulmonic Valve: The pulmonic valve was normal in structure. Pulmonic valve regurgitation is trivial. Aorta: The aortic root is normal in size and structure. IAS/Shunts: No atrial level shunt detected by color flow Doppler. Kate Sable MD Electronically signed by Kate Sable MD Signature Date/Time: 08/19/2022/1:15:38 PM    Final (Updated)    NM WBC SCAN TUMOR LOC LIMITED  Result Date: 08/18/2022 CLINICAL DATA:  Gram-negative bacteremia of uncertain origin EXAM: NUCLEAR MEDICINE LEUKOCYTE SCAN TECHNIQUE: Following intravenous administration of radiolabeled white blood cells, images of the head, neck, trunk, and extremities were obtained on subsequent days. RADIOPHARMACEUTICALS:  74.12 millicuries IN-86V Ceretec labeled autologous leukocytes IV COMPARISON:  None Available. FINDINGS: Expected distribution of labeled leukocytes within spleen, liver, and bone marrow. No abnormal sites of labeled leukocyte localization are seen to suggest occult infection. IMPRESSION: Negative whole-body labeled leukocytes study. Electronically Signed   By: Lavonia Dana M.D.   On: 08/18/2022 16:19   CT SOFT TISSUE NECK W CONTRAST  Result Date: 08/14/2022 CLINICAL DATA:  Concern for sublingual/submandibular abscess. EXAM: CT NECK WITH CONTRAST TECHNIQUE: Multidetector CT imaging of the neck was performed using the standard protocol  following the bolus administration of intravenous contrast. RADIATION DOSE REDUCTION: This exam was performed according to the departmental dose-optimization program which includes automated exposure control, adjustment of the mA and/or kV according to patient size and/or use of iterative reconstruction technique. CONTRAST:  70m OMNIPAQUE IOHEXOL 300 MG/ML  SOLN COMPARISON:  None Available. FINDINGS: Pharynx and larynx: Normal. No mass or swelling. Salivary glands: No inflammation, mass, or stone. Thyroid: Normal. Lymph nodes: None enlarged or abnormal density. Vascular: Negative. Limited intracranial: Negative. Visualized orbits: Negative. Mastoids and visualized paranasal sinuses: Clear. Skeleton: Status post C4-C7 ACDF. Grade 1 anterolisthesis of C7 on T1. Spinal fusion hardware is intact. There is osseous fusion at the surgical levels. Upper chest: Negative. Other: None. IMPRESSION: No evidence of sublingual/submandibular abscess. No evidence of dental caries at the remaining teeth along the maxillary and mandibular arches. No of periapical or soft tissue abscess. Electronically Signed   By: HMarin RobertsM.D.   On: 08/14/2022 13:14   UKoreaVenous Img Lower Bilateral (DVT)  Result Date: 08/12/2022 CLINICAL DATA:  Bilateral lower extremity pain. EXAM: BILATERAL LOWER EXTREMITY VENOUS DOPPLER ULTRASOUND TECHNIQUE: Gray-scale sonography with graded compression, as well as color Doppler and duplex ultrasound were performed to evaluate the lower extremity deep venous systems from the level of the common femoral vein and including the common femoral, femoral, profunda femoral, popliteal and calf veins including the posterior tibial, peroneal and gastrocnemius veins when visible. The superficial great saphenous vein was also interrogated. Spectral Doppler was utilized to evaluate flow at rest and with distal augmentation maneuvers in the common femoral, femoral and popliteal veins. COMPARISON:  July 08, 2022.  FINDINGS: RIGHT LOWER EXTREMITY Common Femoral Vein: No evidence of thrombus. Normal compressibility, respiratory phasicity and response to augmentation. Saphenofemoral Junction: No evidence of thrombus. Normal compressibility and flow on color Doppler imaging. Profunda Femoral Vein: No evidence of thrombus. Normal compressibility and flow on color Doppler imaging. Femoral Vein: No evidence of thrombus. Normal compressibility, respiratory phasicity and response to augmentation. Popliteal Vein: No evidence of thrombus. Normal compressibility, respiratory phasicity and response to augmentation. Calf Veins: No evidence of thrombus. Normal compressibility and flow on color Doppler imaging. Superficial Great Saphenous Vein: No evidence of thrombus. Normal compressibility. Venous Reflux:  None. Other Findings:  None. LEFT LOWER EXTREMITY Common Femoral Vein: No evidence of thrombus. Normal compressibility, respiratory phasicity and response to augmentation. Saphenofemoral Junction: No evidence of thrombus. Normal compressibility and flow on color Doppler imaging. Profunda Femoral Vein: No evidence of thrombus. Normal compressibility and flow on color Doppler imaging. Femoral Vein: No evidence of thrombus. Normal compressibility, respiratory phasicity and response to augmentation. Popliteal Vein: No evidence of thrombus. Normal compressibility, respiratory phasicity and response to augmentation. Calf Veins: No evidence of thrombus. Normal compressibility and flow on color Doppler imaging. Superficial Great Saphenous Vein: No evidence of thrombus. Normal compressibility. Venous Reflux:  None. Other Findings:  None. IMPRESSION: No evidence of deep venous thrombosis in either lower extremity. Electronically Signed   By: Marijo Conception M.D.   On: 08/12/2022 09:57   MR BRAIN WO CONTRAST  Result Date: 08/12/2022 CLINICAL DATA:  Initial evaluation for syncope/presyncope. EXAM: MRI HEAD WITHOUT CONTRAST TECHNIQUE:  Multiplanar, multiecho pulse sequences of the brain and surrounding structures were obtained without intravenous contrast. COMPARISON:  Prior study from 07/08/2022. FINDINGS: Brain: Cerebral volume within normal limits for age. No focal parenchymal signal abnormality. No abnormal foci of restricted diffusion to suggest acute or subacute ischemia. Gray-white matter differentiation well maintained. No encephalomalacia to suggest chronic cortical infarction or other insult. No foci of susceptibility artifact indicative of acute or chronic intracranial blood products. No mass lesion, midline shift or mass effect. Ventricles normal in size and morphology without hydrocephalus. No extra-axial fluid collection. Pituitary gland and suprasellar region within normal limits. Vascular: Major intracranial vascular flow voids are well maintained. Skull and upper cervical spine: Craniocervical junction within normal limits. Visualized upper cervical spine demonstrates no significant finding. Bone marrow signal intensity within normal limits. No scalp soft tissue abnormality. Sinuses/Orbits: Globes and orbital soft tissues are within normal limits. Scattered mucosal thickening noted about the ethmoidal air cells. Trace left mastoid effusion, of doubtful significance. Other: None. IMPRESSION: Normal brain MRI. No acute intracranial abnormality identified. Electronically Signed   By: Jeannine Boga M.D.   On: 08/12/2022 02:14   CT Angio Chest PE W/Cm &/Or Wo Cm  Result Date: 08/11/2022 CLINICAL DATA:  High probability for PE. Syncope and chest pressure. EXAM: CT ANGIOGRAPHY CHEST WITH CONTRAST TECHNIQUE: Multidetector CT imaging of the chest was performed using the standard protocol during bolus administration of intravenous contrast. Multiplanar CT image reconstructions and MIPs were obtained to evaluate the vascular anatomy. RADIATION DOSE REDUCTION: This exam was performed according to the departmental dose-optimization  program which includes automated exposure control, adjustment of the mA and/or kV according to patient size and/or use of iterative reconstruction technique. CONTRAST:  71m OMNIPAQUE IOHEXOL 350 MG/ML SOLN COMPARISON:  CT angiogram chest 07/08/2022 FINDINGS: Cardiovascular: Satisfactory opacification of the pulmonary arteries to the segmental level. No evidence of pulmonary embolism. Normal heart size. No pericardial effusion. Mediastinum/Nodes: No enlarged mediastinal, hilar, or axillary lymph nodes. Thyroid gland, trachea, and esophagus demonstrate no significant findings. Lungs/Pleura: There is a calcified granuloma in the left upper lobe. The lungs are otherwise clear. There is no pleural effusion or pneumothorax. Upper Abdomen: No acute abnormality. Musculoskeletal: Cervical spinal fusion plate is partially visualized. No acute fractures. Review of the MIP images confirms the above findings. IMPRESSION: No evidence for pulmonary embolism or other acute cardiopulmonary process. Electronically Signed   By: ARonney AstersM.D.   On: 08/11/2022 23:31   DG Chest 2 View  Result Date: 08/11/2022 CLINICAL DATA:  Syncopal episode and chest pain. EXAM: CHEST - 2 VIEW  COMPARISON:  July 08, 2022 FINDINGS: Cardiomediastinal contours and hilar structures are normal. Lungs are clear. No pneumothorax. No pleural effusion. On limited assessment no acute skeletal findings. IMPRESSION: No active cardiopulmonary disease. Electronically Signed   By: Zetta Bills M.D.   On: 08/11/2022 16:34    Microbiology: Results for orders placed or performed during the hospital encounter of 08/11/22  Resp Panel by RT-PCR (Flu A&B, Covid) Anterior Nasal Swab     Status: None   Collection Time: 08/11/22  4:01 PM   Specimen: Anterior Nasal Swab  Result Value Ref Range Status   SARS Coronavirus 2 by RT PCR NEGATIVE NEGATIVE Final    Comment: (NOTE) SARS-CoV-2 target nucleic acids are NOT DETECTED.  The SARS-CoV-2 RNA is  generally detectable in upper respiratory specimens during the acute phase of infection. The lowest concentration of SARS-CoV-2 viral copies this assay can detect is 138 copies/mL. A negative result does not preclude SARS-Cov-2 infection and should not be used as the sole basis for treatment or other patient management decisions. A negative result may occur with  improper specimen collection/handling, submission of specimen other than nasopharyngeal swab, presence of viral mutation(s) within the areas targeted by this assay, and inadequate number of viral copies(<138 copies/mL). A negative result must be combined with clinical observations, patient history, and epidemiological information. The expected result is Negative.  Fact Sheet for Patients:  EntrepreneurPulse.com.au  Fact Sheet for Healthcare Providers:  IncredibleEmployment.be  This test is no t yet approved or cleared by the [place] FDA and  has been authorized for detection and/or diagnosis of SARS-CoV-2 by FDA under an Emergency Use Authorization (EUA). This EUA will remain  in effect (meaning this test can be used) for the duration of the COVID-19 declaration under Section 564(b)(1) of the Act, 21 U.S.C.section 360bbb-3(b)(1), unless the authorization is terminated  or revoked sooner.       Influenza A by PCR NEGATIVE NEGATIVE Final   Influenza B by PCR NEGATIVE NEGATIVE Final    Comment: (NOTE) The Xpert Xpress SARS-CoV-2/FLU/RSV plus assay is intended as an aid in the diagnosis of influenza from Nasopharyngeal swab specimens and should not be used as a sole basis for treatment. Nasal washings and aspirates are unacceptable for Xpert Xpress SARS-CoV-2/FLU/RSV testing.  Fact Sheet for Patients: EntrepreneurPulse.com.au  Fact Sheet for Healthcare Providers: IncredibleEmployment.be  This test is not yet approved or cleared by the [place] FDA and has been authorized for detection and/or diagnosis of SARS-CoV-2 by FDA under an Emergency Use Authorization (EUA). This EUA will remain in effect (meaning this test can be used) for the duration of the COVID-19 declaration under Section 564(b)(1) of the Act, 21 U.S.C. section 360bbb-3(b)(1), unless the authorization is terminated or revoked.  Performed at [facility], [address] [place], [place] [address]   Blood culture (single)     Status: Abnormal   Collection Time: 08/11/22  4:02 PM   Specimen: BLOOD  Result Value Ref Range Status   Specimen Description   Final    BLOOD BLOOD RIGHT HAND Performed at [facility], [address], [place], [place] [address]    Special Requests   Final    BOTTLES DRAWN AEROBIC AND ANAEROBIC Blood Culture adequate volume Performed at [facility], [address] [place], [place] [address]    Culture  Setup Time   Final    GRAM NEGATIVE RODS AEROBIC BOTTLE ONLY CRITICAL RESULT CALLED TO, READ BACK BY AND VERIFIED WITH:  CAROLINE COULTER ON 08/15/22 AT 1038 QSD CRITICAL VALUE NOTED.  VALUE IS CONSISTENT WITH PREVIOUSLY REPORTED AND CALLED VALUE.    Culture (A)  Final    PANTOEA SPECIES SUSCEPTIBILITIES PERFORMED ON PREVIOUS CULTURE WITHIN THE LAST 5 DAYS. Performed at [facility], [place] [address], [place], [place] [address]    Report Status 08/17/2022 FINAL  Final  Urine Culture     Status: Abnormal   Collection Time: 08/11/22  4:02 PM   Specimen: Urine, Random  Result Value Ref Range Status   Specimen Description   Final    URINE, RANDOM Performed at [facility], [address], [place], [place] [address]    Special Requests   Final    NONE Performed at [facility], [address] [place], [place] [address]    Culture (A)  Final    30,000 COLONIES/mL LACTOBACILLUS SPECIES Standardized susceptibility testing for this organism is not available. Performed at  [facility], [place] [address] [place], [place] [address]    Report Status 08/13/2022 FINAL  Final  Blood Culture (routine x 2)     Status: Abnormal   Collection Time: 08/11/22 10:41 PM   Specimen: BLOOD  Result Value Ref Range Status   Specimen Description   Final    BLOOD LEFT ANTECUBITAL Performed at [facility], [address] [place], [place] [address]    Special Requests   Final    BOTTLES DRAWN AEROBIC AND ANAEROBIC Blood Culture adequate volume Performed at [facility], [address] [place], [place] [address]    Culture  Setup Time   Final    Organism ID to follow [place] BOTTLE ONLY CRITICAL RESULT CALLED TO, READ BACK BY AND VERIFIED WITH: NATHAN BELUE AT 2239 ON 08/12/22 BY SS Performed at [facility], [address] [place], [place] [address]    Culture PANTOEA SPECIES (A)  Final   Report Status 08/15/2022 FINAL  Final   Organism ID, Bacteria PANTOEA SPECIES  Final      Susceptibility   Pantoea species - MIC*    CEFAZOLIN >=64 RESISTANT Resistant     CEFEPIME <=0.12 SENSITIVE Sensitive     CEFTAZIDIME <=1 SENSITIVE Sensitive     CEFTRIAXONE <=0.25 SENSITIVE Sensitive     CIPROFLOXACIN <=0.25 SENSITIVE Sensitive     GENTAMICIN <=1 SENSITIVE Sensitive     IMIPENEM <=0.25 SENSITIVE Sensitive     TRIMETH/SULFA <=20 SENSITIVE Sensitive     PIP/TAZO <=4 SENSITIVE Sensitive     * PANTOEA SPECIES  Blood Culture ID Panel (Reflexed)     Status: Abnormal   Collection Time: 08/11/22 10:41 PM  Result Value Ref Range Status   Enterococcus faecalis NOT DETECTED NOT DETECTED Final   Enterococcus Faecium NOT DETECTED NOT DETECTED Final   Listeria monocytogenes NOT DETECTED NOT DETECTED Final   Staphylococcus species NOT DETECTED NOT DETECTED Final   Staphylococcus aureus (BCID) NOT DETECTED NOT DETECTED Final   Staphylococcus epidermidis NOT DETECTED NOT DETECTED Final   Staphylococcus lugdunensis NOT DETECTED  NOT DETECTED Final   Streptococcus species NOT DETECTED NOT DETECTED Final   Streptococcus agalactiae NOT DETECTED NOT DETECTED Final   Streptococcus pneumoniae NOT DETECTED NOT DETECTED Final   Streptococcus pyogenes NOT DETECTED NOT DETECTED Final   A.calcoaceticus-baumannii NOT DETECTED NOT DETECTED Final   Bacteroides fragilis NOT DETECTED NOT DETECTED Final   Enterobacterales DETECTED (A) NOT DETECTED Final    Comment: Enterobacterales represent a large  order of gram negative bacteria, not a single organism. Refer to culture for further identification. CRITICAL RESULT CALLED TO, READ BACK BY AND VERIFIED WITH: NATHAN BELUE AT 2239 ON 08/12/22 BY SS    Enterobacter cloacae complex NOT DETECTED NOT DETECTED Final   Escherichia coli NOT DETECTED NOT DETECTED Final   Klebsiella aerogenes NOT DETECTED NOT DETECTED Final   Klebsiella oxytoca NOT DETECTED NOT DETECTED Final   Klebsiella pneumoniae NOT DETECTED NOT DETECTED Final   Proteus species NOT DETECTED NOT DETECTED Final   Salmonella species NOT DETECTED NOT DETECTED Final   Serratia marcescens NOT DETECTED NOT DETECTED Final   Haemophilus influenzae NOT DETECTED NOT DETECTED Final   Neisseria meningitidis NOT DETECTED NOT DETECTED Final   Pseudomonas aeruginosa NOT DETECTED NOT DETECTED Final   Stenotrophomonas maltophilia NOT DETECTED NOT DETECTED Final   Candida albicans NOT DETECTED NOT DETECTED Final   Candida auris NOT DETECTED NOT DETECTED Final   Candida glabrata NOT DETECTED NOT DETECTED Final   Candida krusei NOT DETECTED NOT DETECTED Final   Candida parapsilosis NOT DETECTED NOT DETECTED Final   Candida tropicalis NOT DETECTED NOT DETECTED Final   Cryptococcus neoformans/gattii NOT DETECTED NOT DETECTED Final   CTX-M ESBL NOT DETECTED NOT DETECTED Final   Carbapenem resistance IMP NOT DETECTED NOT DETECTED Final   Carbapenem resistance KPC NOT DETECTED NOT DETECTED Final   Carbapenem resistance NDM NOT DETECTED NOT  DETECTED Final   Carbapenem resist OXA 48 LIKE NOT DETECTED NOT DETECTED Final   Carbapenem resistance VIM NOT DETECTED NOT DETECTED Final    Comment: Performed at [facility], [address] [place], [place] [address]  Culture, blood (Routine X 2) w Reflex to ID Panel     Status: None   Collection Time: 08/14/22 12:07 PM   Specimen: BLOOD  Result Value Ref Range Status   Specimen Description BLOOD BLOOD RIGHT WRIST  Final   Special Requests   Final    BOTTLES DRAWN AEROBIC AND ANAEROBIC Blood Culture adequate volume   Culture   Final    NO GROWTH 5 DAYS Performed at [facility], [address] [place], [name] [address]    Report Status 08/19/2022 FINAL  Final  Culture, blood (Routine X 2) w Reflex to ID Panel     Status: None   Collection Time: 08/14/22 12:16 PM   Specimen: BLOOD  Result Value Ref Range Status   Specimen Description BLOOD RIGHT ANTECUBITAL  Final   Special Requests   Final    BOTTLES DRAWN AEROBIC AND ANAEROBIC Blood Culture adequate volume   Culture   Final    NO GROWTH 5 DAYS Performed at [facility], [address] [place], [place] [address]    Report Status 08/19/2022 FINAL  Final    Labs: CBC: Recent Labs  Lab 08/13/22 0445 08/17/22 0927  WBC 5.4 5.2  NEUTROABS 2.3 2.3  HGB 9.4* 12.2  HCT 28.5* 37.5  MCV 89.3 90.4  PLT 149* 732   Basic Metabolic Panel: Recent Labs  Lab 08/13/22 0445 08/14/22 0504 08/15/22 0841 08/18/22 0357  NA 139 139  --   --   K 4.2 3.8  --   --   CL 109 103  --   --   CO2 24 28  --   --   GLUCOSE 95 86  --   --   BUN 14 15  --   --   CREATININE 0.82 0.90 0.91 0.87  CALCIUM 8.7*  8.7*  --   --    Liver Function Tests: No results for input(s): "AST", "ALT", "ALKPHOS", "BILITOT", "PROT", "ALBUMIN" in the last 168 hours. CBG: Recent Labs  Lab 08/15/22 1143 08/15/22 1536 08/16/22 0534 08/17/22 0547 08/17/22 1946  GLUCAP 74 120* 94 93 96    Discharge time spent: {LESS  THAN/GREATER THAN:26388} 30 minutes.  Signed: Jennye Boroughs, MD [facility] 08/19/2022

## 2022-08-19 NOTE — Progress Notes (Signed)
*  PRELIMINARY RESULTS* Echocardiogram Echocardiogram Transesophageal has been performed.  [name] 08/19/2022, 12:57 PM

## 2022-08-19 NOTE — Progress Notes (Signed)
*  PRELIMINARY RESULTS* Echocardiogram 2D Echocardiogram has been performed.  [name] 08/19/2022, 12:57 PM

## 2022-08-19 NOTE — Progress Notes (Signed)
Progress Note    JERELENE SALAAM  KGM:[id number] DOB: [birth year]  DOA: 08/11/2022 PCP: Sandi Mariscal, MD      Brief Narrative:    Medical records reviewed and are as summarized below:  [name] is a 56 y.o. female with medical history significant for ADHD, bipolar disorder, depression, seizure disorder, degenerative disc disease, uterine cancer, pertussis, recentlt treated for Pantoea bacteremia at [facility] hospital in September 2023.  At that time, extensive work-up in the hospital was unrevealing.  Repeat blood culture on 07/10/2022 was negative so she was discharged on oral Bactrim.  However, she only took Bactrim for 1 day and stopped treatment.  She presented to the hospital with chills, dizziness and syncope. Etiology of syncope is not clear.  Orthostatic vital signs was negative.  Blood culture showed Pantoea bacteremia         Assessment/Plan:   Principal Problem:   Syncope Active Problems:   Sepsis ([place])   AKI (acute kidney injury) ([place])   Alcoholism ([place])   Seizure ([place])   Hypotension   Bacteremia due to Gram-negative bacteria    Body [name] index is 25.03 kg/m.  S/p syncope: Orthostatic vital signs negative.    Sepsis, pantoea bacteremia: Continue IV cefepime.  ID is on board.  Plan for TEE today.  Follow-up with cardiologist.  Bipolar disorder, depression, ADHD: Continue Adderall and Cymbalta  Low cortisol (5.5) on previous admission but cortisol on 08/13/2022 was 6.3.  AKI: Resolved  Other comorbidities include degenerative disc disease, alcohol use    Diet Order             Diet NPO time specified  Diet effective now                            Consultants: Infectious disease Cardiologist  Procedures: Plan for TEE today    Medications:    fentaNYL       lidocaine       midazolam       [MAR Hold] amphetamine-dextroamphetamine  10 mg Oral BID   [MAR Hold] cyclobenzaprine  10 mg Oral TID   [MAR Hold]  DULoxetine  30 mg Oral BID   [MAR Hold] gabapentin  300 mg Oral TID   [MAR Hold] heparin  5,000 Units Subcutaneous Q8H   [MAR Hold] pantoprazole (PROTONIX) IV  40 mg Intravenous Q12H   [MAR Hold] sodium chloride flush  3 mL Intravenous Q12H   [MAR Hold] thiamine (VITAMIN B1) injection  100 mg Intravenous Daily   Continuous Infusions:  sodium chloride 20 mL/hr at 08/19/22 1206   [MAR Hold] ceFEPime (MAXIPIME) IV 2 g (08/19/22 0645)     Anti-infectives (From admission, onward)    Start     Dose/Rate Route Frequency Ordered Stop   08/15/22 2200  ceFEPIme (MAXIPIME) 2 g in sodium chloride 0.9 % 100 mL IVPB  Status:  Discontinued        2 g 200 mL/hr over 30 Minutes Intravenous Every 12 hours 08/15/22 1148 08/15/22 1148   08/15/22 1400  [MAR Hold]  ceFEPIme (MAXIPIME) 2 g in sodium chloride 0.9 % 100 mL IVPB        (MAR Hold since Wed 08/19/2022 at 1157.Hold Reason: Transfer to a Procedural area)   2 g 200 mL/hr over 30 Minutes Intravenous Every 8 hours 08/15/22 1148     08/13/22 1400  ceFEPIme (MAXIPIME) 2 g in sodium chloride 0.9 % 100 mL  IVPB  Status:  Discontinued        2 g 200 mL/hr over 30 Minutes Intravenous Every 8 hours 08/13/22 1020 08/15/22 1148   08/12/22 1700  vancomycin (VANCOCIN) IVPB 1000 mg/200 mL premix  Status:  Discontinued        1,000 mg 200 mL/hr over 60 Minutes Intravenous Every 24 hours 08/12/22 1603 08/13/22 1016   08/12/22 1000  ceFEPIme (MAXIPIME) 2 g in sodium chloride 0.9 % 100 mL IVPB  Status:  Discontinued        2 g 200 mL/hr over 30 Minutes Intravenous Every 12 hours 08/12/22 0846 08/13/22 1020   08/12/22 0900  ceFEPIme (MAXIPIME) 2 g in sodium chloride 0.9 % 100 mL IVPB  Status:  Discontinued        2 g 200 mL/hr over 30 Minutes Intravenous Every 8 hours 08/12/22 0845 08/12/22 0846   08/11/22 2300  vancomycin (VANCOREADY) IVPB 1250 mg/250 mL        1,250 mg 166.7 mL/hr over 90 Minutes Intravenous  Once 08/11/22 2256 08/12/22 0108   08/11/22 2245   ceFEPIme (MAXIPIME) 2 g in sodium chloride 0.9 % 100 mL IVPB        2 g 200 mL/hr over 30 Minutes Intravenous  Once 08/11/22 2242 08/11/22 2316              Family Communication/Anticipated D/C date and plan/Code Status   DVT prophylaxis: heparin injection 5,000 Units Start: 08/12/22 0200     Code Status: Full Code  Family Communication: None Disposition Plan: Plan to discharge home in 1 to 2 days   Status is: Inpatient Remains inpatient appropriate because: Pantoea bacteremia with plan for TEE today       Subjective:   Interval events noted. No fever, chills, cough, abdominal pain or urinary symptoms  Objective:    Vitals:   08/19/22 0335 08/19/22 0354 08/19/22 0735 08/19/22 1200  BP:  106/60 (!) 101/51 101/68  Pulse:  78 71 76  Resp:  '16 18 18  '$ Temp:  97.9 F (36.6 C) 98.2 F (36.8 C) 98.6 F (37 C)  TempSrc:    Oral  SpO2:  97% 98% 100%  Weight: 60.1 kg   60.1 kg  Height:    '5\' 1"'$  (1.549 m)   No data found.  No intake or output data in the 24 hours ending 08/19/22 1219 Filed Weights   08/18/22 0413 08/19/22 0335 08/19/22 1200  Weight: 60.3 kg 60.1 kg 60.1 kg    Exam:  GEN: NAD SKIN: No rash EYES: EOMI ENT: MMM CV: RRR PULM: CTA B ABD: soft, ND, NT, +BS CNS: AAO x 3, non focal EXT: No edema or tenderness         Data Reviewed:   I have personally reviewed following labs and imaging studies:  Labs: Labs show the following:   Basic Metabolic Panel: Recent Labs  Lab 08/13/22 0445 08/14/22 0504 08/15/22 0841 08/18/22 0357  NA 139 139  --   --   K 4.2 3.8  --   --   CL 109 103  --   --   CO2 24 28  --   --   GLUCOSE 95 86  --   --   BUN 14 15  --   --   CREATININE 0.82 0.90 0.91 0.87  CALCIUM 8.7* 8.7*  --   --    GFR Estimated Creatinine Clearance: 59.4 mL/min (by C-G formula based on SCr of 0.87 mg/dL).  Liver Function Tests: No results for input(s): "AST", "ALT", "ALKPHOS", "BILITOT", "PROT", "ALBUMIN" in the last  168 hours. No results for input(s): "LIPASE", "AMYLASE" in the last 168 hours. No results for input(s): "AMMONIA" in the last 168 hours. Coagulation profile No results for input(s): "INR", "PROTIME" in the last 168 hours.  CBC: Recent Labs  Lab 08/13/22 0445 08/17/22 0927  WBC 5.4 5.2  NEUTROABS 2.3 2.3  HGB 9.4* 12.2  HCT 28.5* 37.5  MCV 89.3 90.4  PLT 149* 215   Cardiac Enzymes: No results for input(s): "CKTOTAL", "CKMB", "CKMBINDEX", "TROPONINI" in the last 168 hours. BNP (last 3 results) No results for input(s): "PROBNP" in the last 8760 hours. CBG: Recent Labs  Lab 08/15/22 1143 08/15/22 1536 08/16/22 0534 08/17/22 0547 08/17/22 1946  GLUCAP 74 120* 94 93 96   D-Dimer: No results for input(s): "DDIMER" in the last 72 hours. Hgb A1c: No results for input(s): "HGBA1C" in the last 72 hours. Lipid Profile: No results for input(s): "CHOL", "HDL", "LDLCALC", "TRIG", "CHOLHDL", "LDLDIRECT" in the last 72 hours. Thyroid function studies: No results for input(s): "TSH", "T4TOTAL", "T3FREE", "THYROIDAB" in the last 72 hours.  Invalid input(s): "FREET3" Anemia work up: No results for input(s): "VITAMINB12", "FOLATE", "FERRITIN", "TIBC", "IRON", "RETICCTPCT" in the last 72 hours. Sepsis Labs: Recent Labs  Lab 08/13/22 0445 08/14/22 0504 08/17/22 0927  PROCALCITON 8.33 5.27  --   WBC 5.4  --  5.2    Microbiology Recent Results (from the past 240 hour(s))  Resp Panel by RT-PCR (Flu A&B, Covid) Anterior Nasal Swab     Status: None   Collection Time: 08/11/22  4:01 PM   Specimen: Anterior Nasal Swab  Result Value Ref Range Status   SARS Coronavirus 2 by RT PCR NEGATIVE NEGATIVE Final    Comment: (NOTE) SARS-CoV-2 target nucleic acids are NOT DETECTED.  The SARS-CoV-2 RNA is generally detectable in upper respiratory specimens during the acute phase of infection. The lowest concentration of SARS-CoV-2 viral copies this assay can detect is 138 copies/mL. A  negative result does not preclude SARS-Cov-2 infection and should not be used as the sole basis for treatment or other patient management decisions. A negative result may occur with  improper specimen collection/handling, submission of specimen other than nasopharyngeal swab, presence of viral mutation(s) within the areas targeted by this assay, and inadequate number of viral copies(<138 copies/mL). A negative result must be combined with clinical observations, patient history, and epidemiological information. The expected result is Negative.  Fact Sheet for Patients:  EntrepreneurPulse.com.au  Fact Sheet for Healthcare Providers:  IncredibleEmployment.be  This test is no t yet approved or cleared by the [place] FDA and  has been authorized for detection and/or diagnosis of SARS-CoV-2 by FDA under an Emergency Use Authorization (EUA). This EUA will remain  in effect (meaning this test can be used) for the duration of the COVID-19 declaration under Section 564(b)(1) of the Act, 21 U.S.C.section 360bbb-3(b)(1), unless the authorization is terminated  or revoked sooner.       Influenza A by PCR NEGATIVE NEGATIVE Final   Influenza B by PCR NEGATIVE NEGATIVE Final    Comment: (NOTE) The Xpert Xpress SARS-CoV-2/FLU/RSV plus assay is intended as an aid in the diagnosis of influenza from Nasopharyngeal swab specimens and should not be used as a sole basis for treatment. Nasal washings and aspirates are unacceptable for Xpert Xpress SARS-CoV-2/FLU/RSV testing.  Fact Sheet for Patients: EntrepreneurPulse.com.au  Fact Sheet for Healthcare Providers: IncredibleEmployment.be  This test is  not yet approved or cleared by the [place] and has been authorized for detection and/or diagnosis of SARS-CoV-2 by FDA under an Emergency Use Authorization (EUA). This EUA will remain in effect (meaning this test can  be used) for the duration of the COVID-19 declaration under Section 564(b)(1) of the Act, 21 U.S.C. section 360bbb-3(b)(1), unless the authorization is terminated or revoked.  Performed at [facility], [address] [place], [place] [address]   Blood culture (single)     Status: Abnormal   Collection Time: 08/11/22  4:02 PM   Specimen: BLOOD  Result Value Ref Range Status   Specimen Description   Final    BLOOD BLOOD RIGHT HAND Performed at [facility], [address], [place], [place] [address]    Special Requests   Final    BOTTLES DRAWN AEROBIC AND ANAEROBIC Blood Culture adequate volume Performed at [facility], [address] [place], [place] [address]    Culture  Setup Time   Final    GRAM NEGATIVE RODS AEROBIC BOTTLE ONLY CRITICAL RESULT CALLED TO, READ BACK BY AND VERIFIED WITH: CAROLINE COULTER ON 08/15/22 AT 1038 QSD CRITICAL VALUE NOTED.  VALUE IS CONSISTENT WITH PREVIOUSLY REPORTED AND CALLED VALUE.    Culture (A)  Final    PANTOEA SPECIES SUSCEPTIBILITIES PERFORMED ON PREVIOUS CULTURE WITHIN THE LAST 5 DAYS. Performed at [facility], [place] [address], [name], [place] [address]    Report Status 08/17/2022 FINAL  Final  Urine Culture     Status: Abnormal   Collection Time: 08/11/22  4:02 PM   Specimen: Urine, Random  Result Value Ref Range Status   Specimen Description   Final    URINE, RANDOM Performed at [facility], [address], [place], [place] [address]    Special Requests   Final    NONE Performed at [name], [address] [place], [place] [address]    Culture (A)  Final    30,000 COLONIES/mL LACTOBACILLUS SPECIES Standardized susceptibility testing for this organism is not available. Performed at [facility], [place] [address], [place], [place] [address]    Report Status 08/13/2022 FINAL  Final  Blood Culture (routine x 2)     Status: Abnormal   Collection Time: 08/11/22  10:41 PM   Specimen: BLOOD  Result Value Ref Range Status   Specimen Description   Final    BLOOD LEFT ANTECUBITAL Performed at [facility], [address] [place], [place] [address]    Special Requests   Final    BOTTLES DRAWN AEROBIC AND ANAEROBIC Blood Culture adequate volume Performed at [facility], [address] [place], [place] [address]    Culture  Setup Time   Final    Organism ID to follow GRAM NEGATIVE RODS ANAEROBIC BOTTLE ONLY CRITICAL RESULT CALLED TO, READ BACK BY AND VERIFIED WITH: NATHAN BELUE AT 2239 ON 08/12/22 BY SS Performed at [facility], [address] [place], [place] [address]    Culture PANTOEA SPECIES (A)  Final   Report Status 08/15/2022 FINAL  Final   Organism ID, Bacteria PANTOEA SPECIES  Final      Susceptibility   Pantoea species - MIC*    CEFAZOLIN >=64 RESISTANT Resistant     CEFEPIME <=0.12 SENSITIVE Sensitive     CEFTAZIDIME <=1 SENSITIVE Sensitive     CEFTRIAXONE <=0.25 SENSITIVE Sensitive     CIPROFLOXACIN <=0.25 SENSITIVE Sensitive  GENTAMICIN <=1 SENSITIVE Sensitive     IMIPENEM <=0.25 SENSITIVE Sensitive     TRIMETH/SULFA <=20 SENSITIVE Sensitive     PIP/TAZO <=4 SENSITIVE Sensitive     * PANTOEA SPECIES  Blood Culture ID Panel (Reflexed)     Status: Abnormal   Collection Time: 08/11/22 10:41 PM  Result Value Ref Range Status   Enterococcus faecalis NOT DETECTED NOT DETECTED Final   Enterococcus Faecium NOT DETECTED NOT DETECTED Final   Listeria monocytogenes NOT DETECTED NOT DETECTED Final   Staphylococcus species NOT DETECTED NOT DETECTED Final   Staphylococcus aureus (BCID) NOT DETECTED NOT DETECTED Final   Staphylococcus epidermidis NOT DETECTED NOT DETECTED Final   Staphylococcus lugdunensis NOT DETECTED NOT DETECTED Final   Streptococcus species NOT DETECTED NOT DETECTED Final   Streptococcus agalactiae NOT DETECTED NOT DETECTED Final   Streptococcus pneumoniae NOT DETECTED NOT  DETECTED Final   Streptococcus pyogenes NOT DETECTED NOT DETECTED Final   A.calcoaceticus-baumannii NOT DETECTED NOT DETECTED Final   Bacteroides fragilis NOT DETECTED NOT DETECTED Final   Enterobacterales DETECTED (A) NOT DETECTED Final    Comment: Enterobacterales represent a large order of gram negative bacteria, not a single organism. Refer to culture for further identification. CRITICAL RESULT CALLED TO, READ BACK BY AND VERIFIED WITH: NATHAN BELUE AT 2239 ON 08/12/22 BY SS    Enterobacter cloacae complex NOT DETECTED NOT DETECTED Final   Escherichia coli NOT DETECTED NOT DETECTED Final   Klebsiella aerogenes NOT DETECTED NOT DETECTED Final   Klebsiella oxytoca NOT DETECTED NOT DETECTED Final   Klebsiella pneumoniae NOT DETECTED NOT DETECTED Final   Proteus species NOT DETECTED NOT DETECTED Final   Salmonella species NOT DETECTED NOT DETECTED Final   Serratia marcescens NOT DETECTED NOT DETECTED Final   Haemophilus influenzae NOT DETECTED NOT DETECTED Final   Neisseria meningitidis NOT DETECTED NOT DETECTED Final   Pseudomonas aeruginosa NOT DETECTED NOT DETECTED Final   Stenotrophomonas maltophilia NOT DETECTED NOT DETECTED Final   Candida albicans NOT DETECTED NOT DETECTED Final   Candida auris NOT DETECTED NOT DETECTED Final   Candida glabrata NOT DETECTED NOT DETECTED Final   Candida krusei NOT DETECTED NOT DETECTED Final   Candida parapsilosis NOT DETECTED NOT DETECTED Final   Candida tropicalis NOT DETECTED NOT DETECTED Final   Cryptococcus neoformans/gattii NOT DETECTED NOT DETECTED Final   CTX-M ESBL NOT DETECTED NOT DETECTED Final   Carbapenem resistance IMP NOT DETECTED NOT DETECTED Final   Carbapenem resistance KPC NOT DETECTED NOT DETECTED Final   Carbapenem resistance NDM NOT DETECTED NOT DETECTED Final   Carbapenem resist OXA 48 LIKE NOT DETECTED NOT DETECTED Final   Carbapenem resistance VIM NOT DETECTED NOT DETECTED Final    Comment: Performed at [facility], [address] [place], [place] [address]  Culture, blood (Routine X 2) w Reflex to ID Panel     Status: None   Collection Time: 08/14/22 12:07 PM   Specimen: BLOOD  Result Value Ref Range Status   Specimen Description BLOOD BLOOD RIGHT WRIST  Final   Special Requests   Final    BOTTLES DRAWN AEROBIC AND ANAEROBIC Blood Culture adequate volume   Culture   Final    NO GROWTH 5 DAYS Performed at [facility], [address] [place],  [address]    Report Status 08/19/2022 FINAL  Final  Culture, blood (Routine X 2) w Reflex to ID Panel     Status: None   Collection Time: 08/14/22 12:16 PM  Specimen: BLOOD  Result Value Ref Range Status   Specimen Description BLOOD RIGHT ANTECUBITAL  Final   Special Requests   Final    BOTTLES DRAWN AEROBIC AND ANAEROBIC Blood Culture adequate volume   Culture   Final    NO GROWTH 5 DAYS Performed at [facility], [address] [place], [place] [address]    Report Status 08/19/2022 FINAL  Final    Procedures and diagnostic studies:  NM WBC SCAN TUMOR LOC LIMITED  Result Date: 08/18/2022 CLINICAL DATA:  Gram-negative bacteremia of uncertain origin EXAM: NUCLEAR MEDICINE LEUKOCYTE SCAN TECHNIQUE: Following intravenous administration of radiolabeled white blood cells, images of the head, neck, trunk, and extremities were obtained on subsequent days. RADIOPHARMACEUTICALS:  98.33 millicuries AS-50N Ceretec labeled autologous leukocytes IV COMPARISON:  None Available. FINDINGS: Expected distribution of labeled leukocytes within spleen, liver, and bone marrow. No abnormal sites of labeled leukocyte localization are seen to suggest occult infection. IMPRESSION: Negative whole-body labeled leukocytes study. Electronically Signed   By: Lavonia Dana M.D.   On: 08/18/2022 16:19               LOS: 6 days   Jodye Scali  [facility]   Pager on [email or web address]. If 7PM-7AM, please contact night-coverage  at [email or web address]     08/19/2022, 12:19 PM

## 2022-08-19 NOTE — Progress Notes (Signed)
Mobility Specialist - Progress Note   08/19/22 1105  Mobility  Activity Ambulated independently to bathroom  Level of Assistance Independent  Assistive Device None  Distance Ambulated (ft) 10 ft  Activity Response Tolerated well  $Mobility charge 1 Mobility   Pt OOB on RA in restroom upon arrival. Pt ambulates back to bed indep. Pt left in bed with needs in reach.   Gretchen Short  Mobility Specialist  08/19/22 11:06 AM

## 2022-08-19 NOTE — Progress Notes (Signed)
Return to room from TEE, a little drowsy but awaken to voice. Ice chips given. See VS

## 2022-08-19 NOTE — Progress Notes (Signed)
Date of Admission:  08/11/2022     ID: [name] is a 56 y.o. female Principal Problem:   Syncope Active Problems:   Alcoholism ([place])   Seizure ([place])   Hypotension   Sepsis ([place])   AKI (acute kidney injury) ([place])   Bacteremia due to Gram-negative bacteria    Subjective: Pt has no specific complaints TEE done and negative for vegetation  Medications:   amphetamine-dextroamphetamine  10 mg Oral BID   cyclobenzaprine  10 mg Oral TID   DULoxetine  30 mg Oral BID   gabapentin  300 mg Oral TID   heparin  5,000 Units Subcutaneous Q8H   pantoprazole (PROTONIX) IV  40 mg Intravenous Q12H   sodium chloride flush  3 mL Intravenous Q12H   thiamine (VITAMIN B1) injection  100 mg Intravenous Daily    Objective: Vital signs in last 24 hours: Temp:  [97.9 F (36.6 C)-98.2 F (36.8 C)] 98.2 F (36.8 C) (10/25 0735) Pulse Rate:  [71-78] 71 (10/25 0735) Resp:  [16-18] 18 (10/25 0735) BP: (90-115)/(50-76) 101/51 (10/25 0735) SpO2:  [97 %-99 %] 98 % (10/25 0735) Weight:  [60.1 kg] 60.1 kg (10/25 0335)    PHYSICAL EXAM:  General: Alert, cooperative, no distress, appears stated age.  Face multiple excoriations -Lungs: Clear to auscultation bilaterally. No Wheezing or Rhonchi. No rales. Heart: Regular rate and rhythm, no murmur, rub or gallop. Abdomen: Soft, non-tender,not distended. Bowel sounds normal. No masses Extremities: atraumatic, no cyanosis. No edema. No clubbing Skin: No rashes or lesions. Or bruising Lymph: Cervical, supraclavicular normal. Neurologic: Grossly non-focal  Lab Results Recent Labs    08/17/22 0927 08/18/22 0357  WBC 5.2  --   HGB 12.2  --   HCT 37.5  --   CREATININE  --  0.87      Microbiology: 08/11/22 BC- pantoea 08/14/22 BC- NG so far Studies/Results: NM WBC SCAN TUMOR LOC LIMITED  Result Date: 08/18/2022 CLINICAL DATA:  Gram-negative bacteremia of uncertain origin EXAM: NUCLEAR MEDICINE LEUKOCYTE SCAN TECHNIQUE: Following  intravenous administration of radiolabeled white blood cells, images of the head, neck, trunk, and extremities were obtained on subsequent days. RADIOPHARMACEUTICALS:  70.62 millicuries BJ-62G Ceretec labeled autologous leukocytes IV COMPARISON:  None Available. FINDINGS: Expected distribution of labeled leukocytes within spleen, liver, and bone marrow. No abnormal sites of labeled leukocyte localization are seen to suggest occult infection. IMPRESSION: Negative whole-body labeled leukocytes study. Electronically Signed   By: Lavonia Dana M.D.   On: 08/18/2022 16:19     Assessment/Plan: syncope  Chills High procal Recurrent Pantoea bacteremia Previoulsy in Sept 2023 with extensive work up ( TEE, MRI entire spine, CT abd/pelvis/chest/brain) were all negative She got 5 days of IV antibiotic while in  the hospital , repeat blood culture on 07/10/22 was negative. She was sent home on PO bactrim for 7 more days which she did not take, I saw her in my office on 07/30/22 and repeated blood culture which was negative. She got readmitted on 08/11/22 with syncope and blood culture positive again Pt confirmed that she was mainlining her opioid tablets many months ago Tagged WBC scan neg  repeat TEE  negative vegetation Pt will be treated with PO cipro 526mBID for 4 weeks May do PET scan as OP    Anemia  Low cortisol- follow up with endocrinology  ADHD- on adderall ? ?past h/o hysterectomy complicated by ant abd wall hematoma with bleeding from inferior epigastric vessels needing coil embolization in feb 2021  Self inflicted facial excoriations  Discussed the mnagement with patient and hospitalist  Will follow her as OP

## 2022-08-19 NOTE — TOC CM/SW Note (Signed)
  Transition of Care [facility]) Screening Note   Patient Details  Name: TAYLI BUCH Date of Birth: 01-06-1966   Transition of Care [facility]) CM/SW Contact:    Colen Darling, [place] Phone Number: 08/19/2022, 3:31 PM    Transition of Care Department [facility]) has reviewed patient and no TOC needs have been identified at this time. We will continue to monitor patient advancement through interdisciplinary progression rounds. If new patient transition needs arise, please place a TOC consult.

## 2022-08-19 NOTE — Progress Notes (Signed)
All belonging in room taken home. AVS given to patient verbalized understanding of all contents. Stressed importance of taking antibiotics as prescribed until empty. Refused flu vaccine states will get at family Md office.

## 2022-08-19 NOTE — Procedures (Signed)
Transesophageal Echocardiogram :  Indication: bacteremia Requesting/ordering  physician:   Procedure: 10 ml of viscous lidocaine were given orally to provide local anesthesia to the oropharynx. The patient was positioned supine on the left side, bite block provided. The patient was moderately sedated with the doses of versed and fentanyl as detailed below.  Using digital technique an omniplane probe was advanced into the esophagus without incident.   Moderate sedation: 1. Sedation used:  Versed: '2mg'$ , Fentanyl: 6mg 2. Time administered:   1231  Time when patient started recovery: 1250 3. I was face to face during this time 19 mins  See report in EPIC  for complete details: In brief, imaging revealed normal LV function with no RWMAs and no mural apical thrombus.  .  Estimated ejection fraction was 60%.  Right sided cardiac chambers were normal with no evidence of pulmonary hypertension.  Imaging of the septum showed no ASD or VSD 2D and color flow confirmed no PFO  There is no evidence for vegetation or endocarditis  The LA was well visualized in orthogonal views.  There was no spontaneous contrast and no thrombus in the LA and LA appendage   The descending thoracic aorta had no  mural aortic debris with no evidence of aneurysmal dilation or disection  Conclusion: No evidence for endocarditis   BKate Sable10/25/2023 12:53 PM

## 2022-08-20 ENCOUNTER — Encounter: Payer: Self-pay | Admitting: Cardiology

## 2022-09-22 ENCOUNTER — Telehealth: Payer: Self-pay | Admitting: Infectious Diseases

## 2022-09-22 ENCOUNTER — Inpatient Hospital Stay: Payer: Medicare Other | Admitting: Infectious Diseases

## 2022-09-22 NOTE — Telephone Encounter (Signed)
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

## 2023-03-15 ENCOUNTER — Other Ambulatory Visit: Payer: Self-pay

## 2023-03-15 ENCOUNTER — Emergency Department
Admission: EM | Admit: 2023-03-15 | Discharge: 2023-03-15 | Disposition: A | Discharge status: Home / Self Care | Payer: 59 | Attending: Emergency Medicine | Admitting: Emergency Medicine

## 2023-03-15 ENCOUNTER — Emergency Department: Payer: 59

## 2023-03-15 DIAGNOSIS — Z8541 Personal history of malignant neoplasm of cervix uteri: Secondary | ICD-10-CM | POA: Diagnosis not present

## 2023-03-15 DIAGNOSIS — M5126 Other intervertebral disc displacement, lumbar region: Secondary | ICD-10-CM | POA: Diagnosis not present

## 2023-03-15 DIAGNOSIS — M48061 Spinal stenosis, lumbar region without neurogenic claudication: Secondary | ICD-10-CM | POA: Diagnosis not present

## 2023-03-15 DIAGNOSIS — M545 Low back pain, unspecified: Secondary | ICD-10-CM | POA: Diagnosis present

## 2023-03-15 DIAGNOSIS — M5136 Other intervertebral disc degeneration, lumbar region: Secondary | ICD-10-CM

## 2023-03-15 LAB — CBC
HCT: 35.4 % — ABNORMAL LOW (ref 36.0–46.0)
Hemoglobin: 11.5 g/dL — ABNORMAL LOW (ref 12.0–15.0)
MCH: 29.4 pg (ref 26.0–34.0)
MCHC: 32.5 g/dL (ref 30.0–36.0)
MCV: 90.5 fL (ref 80.0–100.0)
Platelets: 163 10*3/uL (ref 150–400)
RBC: 3.91 MIL/uL (ref 3.87–5.11)
RDW: 13.3 % (ref 11.5–15.5)
WBC: 6.7 10*3/uL (ref 4.0–10.5)
nRBC: 0 % (ref 0.0–0.2)

## 2023-03-15 LAB — BASIC METABOLIC PANEL
Anion gap: 8 (ref 5–15)
BUN: 17 mg/dL (ref 6–20)
CO2: 24 mmol/L (ref 22–32)
Calcium: 9 mg/dL (ref 8.9–10.3)
Chloride: 105 mmol/L (ref 98–111)
Creatinine, Ser: 0.71 mg/dL (ref 0.44–1.00)
GFR, Estimated: >60 mL/min (ref >60)
Glucose, Bld: 100 mg/dL — ABNORMAL HIGH (ref 70–99)
Potassium: 4.2 mmol/L (ref 3.5–5.1)
Sodium: 137 mmol/L (ref 135–145)

## 2023-03-15 MED ORDER — PREDNISONE 10 MG PO TABS: 30.0000 mg | ORAL_TABLET | Freq: Every day | ORAL | 0 refills | Status: DC

## 2023-03-15 MED ORDER — OXYCODONE HCL 5 MG PO TABS
10.0000 mg | ORAL_TABLET | Freq: Once | ORAL | Status: AC
  Administered 2023-03-15: 10 mg via ORAL
  Filled 2023-03-15: qty 2

## 2023-03-15 MED ORDER — PREDNISONE 10 MG PO TABS: 30.0000 mg | ORAL_TABLET | Freq: Every day | ORAL | 0 refills | Status: AC

## 2023-03-15 NOTE — Discharge Instructions (Signed)
You were seen in the emergency department for your back pain.  You had an MRI done that showed a large bulging disc at your L5 with significant spinal stenosis.  It is importantly follow-up with a spine surgeon, you were given the information to follow-up with Dr. Marcell Barlow.  You are given a prescription for steroid.  Take your oxycodone as prescribed.  Pain control:  Acetaminophen (tylenol) - You can take 2 extra strength tablets (1000 mg) every 6 hours as needed for pain/fever.  Prednisone -you are given a prescription for a steroid.  It is important that you take this medication with food.  This medication can cause an upset stomach.  It also can increase your glucose if you have a history of diabetes, so it is important that you check your glucose frequently while you are on this medication.

## 2023-03-15 NOTE — ED Triage Notes (Signed)
Pt c/o sharpe lower back pain that radiates down her right leg for 3 days now.

## 2023-03-15 NOTE — ED Notes (Signed)
See triage note  Presents with lower back pain  States pain  is moving into right leg  Ambulates with slight limp d/t pain  Pain started 3 days ago

## 2023-03-15 NOTE — ED Provider Notes (Signed)
[facility] Provider Note    Event Date/Time   First MD Initiated Contact with Patient 03/15/23 1131     (approximate)   History   Back Pain   HPI  [name] is a 57 y.o. female past medical history signal for bipolar disorder, depression, seizure disorder, degenerative disc disease, history of uterine cancer, who presents to the emergency department with back pain.  Endorses 4 days of progressively worsening back pain.  Lower back pain that radiates down her right leg.  Endorses feeling heavy in her right leg and the pain is so severe she feels like she cannot bear weight on it.  Taking her home oxycodone without improvement of the pain.  No new falls or trauma.  History of a herniated disc that was found on an MRI last year.  States that she will have flareups every month but this is worse than her normal.  Endorses saddle anesthesia and states that she does not feel normal when she wipes.  Constipation for the past 5 days.  Endorses hysterectomy with history of urinary incontinence which she feels like has gotten worse over the past couple of days.  Denies any fever or chills.     Physical Exam   Triage Vital Signs: ED Triage Vitals  Enc Vitals Group     BP 03/15/23 1048 133/82     Pulse Rate 03/15/23 1048 85     Resp 03/15/23 1048 16     Temp 03/15/23 1048 98.1 F (36.7 C)     Temp Source 03/15/23 1048 Oral     SpO2 03/15/23 1048 99 %     Weight 03/15/23 1049 131 lb (59.4 kg)     Height 03/15/23 1049 5\' 1"  (1.549 m)     Head Circumference --      Peak Flow --      Pain Score 03/15/23 1049 10     Pain Loc --      Pain Edu? --      Excl. in GC? --     Most recent vital signs: Vitals:   03/15/23 1048  BP: 133/82  Pulse: 85  Resp: 16  Temp: 98.1 F (36.7 C)  SpO2: 99%    Physical Exam Constitutional:      Appearance: She is well-developed.  HENT:     Head: Atraumatic.  Eyes:     Conjunctiva/sclera: Conjunctivae normal.   Cardiovascular:     Rate and Rhythm: Regular rhythm.  Pulmonary:     Effort: No respiratory distress.  Abdominal:     General: There is no distension.  Musculoskeletal:        General: Normal range of motion.     Cervical back: Normal range of motion.     Comments: Midline lumbar tenderness to palpation.  Sensation intact bilateral lower extremities.  Decree sensation to the saddle area.  +2 radial pulses and DP pulses that are equal bilaterally  Skin:    General: Skin is warm.  Neurological:     Mental Status: She is alert. Mental status is at baseline.      IMPRESSION / MDM / ASSESSMENT AND PLAN / ED COURSE  I reviewed the triage vital signs and the nursing notes.  Differential diagnosis include including sciatica, piriformis syndrome, disc herniation, cauda equina/epidural compression syndrome  Patient given her home oxycodone  Given multiple findings concerning for upper motor neuron symptoms with saddle anesthesia, worsening weakness and paresthesias with known disc herniation will Tane  an emergent MRI to further evaluate for cauda equina or epidural compression syndrome.   RADIOLOGY I independently reviewed imaging, my interpretation of imaging: MRI lumbar spine -disc bulge at L5. Read as L5-S1 disc bulge with severe bilateral arthropathy with right effusion and 10 mm interest final synovial cyst with mass effect to the right paraspinal S1 nerve root.  Reactive edema.  Severe spinal stenosis.   Labs (all labs ordered are listed, but only abnormal results are displayed) Labs interpreted as -    Labs Reviewed  CBC - Abnormal; Notable for the following components:      Result Value   Hemoglobin 11.5 (*)    HCT 35.4 (*)    All other components within normal limits  BASIC METABOLIC PANEL - Abnormal; Notable for the following components:   Glucose, Bld 100 (*)    All other components within normal limits      Patient was given oxycodone for pain control.  Will do a  course of steroids given edema and inflammation to her lower lumbar spine.  Given information to follow-up as an outpatient with spine surgery.  Given return precautions for any worsening symptoms.  Discussed scheduling Tylenol in addition to her oxycodone.  Discussed close follow-up with a primary care provider.   PROCEDURES:  Critical Care performed: No  Procedures  Patient's presentation is most consistent with acute presentation with potential threat to life or bodily function.   MEDICATIONS ORDERED IN ED: Medications  oxyCODONE (Oxy IR/ROXICODONE) immediate release tablet 10 mg (10 mg Oral Given 03/15/23 1145)    FINAL CLINICAL IMPRESSION(S) / ED DIAGNOSES   Final diagnoses:  L4-L5 disc bulge  Spinal stenosis of lumbar region without neurogenic claudication     Rx / DC Orders   ED Discharge Orders          Ordered    predniSONE (DELTASONE) 10 MG tablet  Daily        03/15/23 1304             Note:  This document was prepared using Dragon voice recognition software and may include unintentional dictation errors.   Corena Herter, MD 03/15/23 [phone]

## 2023-03-23 ENCOUNTER — Ambulatory Visit (INDEPENDENT_AMBULATORY_CARE_PROVIDER_SITE_OTHER): Payer: 59 | Admitting: Dermatology

## 2023-03-23 ENCOUNTER — Encounter: Payer: Self-pay | Admitting: Dermatology

## 2023-03-23 VITALS — BP 135/85 | HR 88

## 2023-03-23 DIAGNOSIS — L739 Follicular disorder, unspecified: Secondary | ICD-10-CM

## 2023-03-23 DIAGNOSIS — L810 Postinflammatory hyperpigmentation: Secondary | ICD-10-CM | POA: Diagnosis not present

## 2023-03-23 DIAGNOSIS — L299 Pruritus, unspecified: Secondary | ICD-10-CM

## 2023-03-23 MED ORDER — MUPIROCIN 2 % EX OINT: 1.0000 | TOPICAL_OINTMENT | Freq: Every day | CUTANEOUS | 0 refills | Status: AC

## 2023-03-23 MED ORDER — MOMETASONE FUROATE 0.1 % EX CREA: TOPICAL_CREAM | CUTANEOUS | 1 refills | Status: DC

## 2023-03-23 MED ORDER — DOXYCYCLINE MONOHYDRATE 100 MG PO CAPS: 100.0000 mg | ORAL_CAPSULE | Freq: Two times a day (BID) | ORAL | 1 refills | Status: DC

## 2023-03-23 NOTE — Patient Instructions (Addendum)
Start doxycycline 100 mg capsule - take 1 pill by mouth twice daily for 2 weeks. After 2 weeks continue taking 1 pill by mouth daily. Take with food and drink.   Doxycycline should be taken with food to prevent nausea. Do not lay down for 30 minutes after taking. Be cautious with sun exposure and use good sun protection while on this medication. Pregnant women should not take this medication.    Start a medicated over the counter wash as a body wash and face. Avoid eye area  Recommend OTC benzoyl peroxide cleanser, wash affected areas daily in shower, let sit several minutes prior to rinsing.  May bleach towels if not rinsed off completely.  Recommended brands include Panoxyl 4% Creamy Wash, CeraVe Acne Foaming Cream wash, or Cetaphil Gentle Clear Complexion-Clearing BPO Acne Cleanser.  Or   Recommend using [employer] daily, leave on for 1-2 minutes before rinsing off. This can be purchased online.   Start mometasone cream -  Apply topically 1 - 2 times daily  to itchy areas. Use for up to 2 weeks  Topical steroids (such as triamcinolone, fluocinolone, fluocinonide, mometasone, clobetasol, halobetasol, betamethasone, hydrocortisone) can cause thinning and lightening of the skin if they are used for too long in the same area. Your physician has selected the right strength medicine for your problem and area affected on the body. Please use your medication only as directed by your physician to prevent side effects.    Apply mupirocin ointment to any open areas daily   Recommend cerave or cetaphil moisturizer found over the counter to use after shower.    Gentle Skin Care Guide  1. Bathe no more than once a day.  2. Avoid bathing in hot water  3. Use a mild soap like Dove, Vanicream, Cetaphil, CeraVe. Can use Lever 2000 or Cetaphil antibacterial soap  4. Use soap only where you need it. On most days, use it under your arms, between your legs, and on your feet. Let the water rinse  other areas unless visibly dirty.  5. When you get out of the bath/shower, use a towel to gently blot your skin dry, don't rub it.  6. While your skin is still a little damp, apply a moisturizing cream such as Vanicream, CeraVe, Cetaphil, Eucerin, Sarna lotion or plain Vaseline Jelly. For hands apply Neutrogena [place] Hand Cream or Excipial Hand Cream.  7. Reapply moisturizer any time you start to itch or feel dry.  8. Sometimes using free and clear laundry detergents can be helpful. Fabric softener sheets should be avoided. Downy Free & Gentle liquid, or any liquid fabric softener that is free of dyes and perfumes, it acceptable to use  9. If your doctor has given you prescription creams you may apply moisturizers over them       Due to recent changes in healthcare laws, you may see results of your pathology and/or laboratory studies on MyChart before the doctors have had a chance to review them. We understand that in some cases there may be results that are confusing or concerning to you. Please understand that not all results are received at the same time and often the doctors may need to interpret multiple results in order to provide you with the best plan of care or course of treatment. Therefore, we ask that you please give [place] 2 business days to thoroughly review all your results before contacting the office for clarification. Should we see a critical lab result, you will be  contacted sooner.   If You Need Anything After Your Visit  If you have any questions or concerns for your doctor, please call our main line at [phone] and press option 4 to reach your doctor's medical assistant. If no one answers, please leave a voicemail as directed and we will return your call as soon as possible. Messages left after 4 pm will be answered the following business day.   You may also send [place] a message via MyChart. We typically respond to MyChart messages within 1-2 business days.  For  prescription refills, please ask your pharmacy to contact our office. Our fax number is [phone].  If you have an urgent issue when the clinic is closed that cannot wait until the next business day, you can page your doctor at the number below.    Please note that while we do our best to be available for urgent issues outside of office hours, we are not available 24/7.   If you have an urgent issue and are unable to reach [place], you may choose to seek medical care at your doctor's office, retail clinic, urgent care center, or emergency room.  If you have a medical emergency, please immediately call 911 or go to the emergency department.  Pager Numbers  - Dr. Gwen Pounds: [phone]  - Dr. Neale Burly: [phone]  - Dr. Roseanne Reno: [phone]  In the event of inclement weather, please call our main line at [phone] for an update on the status of any delays or closures.  Dermatology Medication Tips: Please keep the boxes that topical medications come in in order to help keep track of the instructions about where and how to use these. Pharmacies typically print the medication instructions only on the boxes and not directly on the medication tubes.   If your medication is too expensive, please contact our office at [phone] option 4 or send [place] a message through MyChart.   We are unable to tell what your co-pay for medications will be in advance as this is different depending on your insurance coverage. However, we may be able to find a substitute medication at lower cost or fill out paperwork to get insurance to cover a needed medication.   If a prior authorization is required to get your medication covered by your insurance company, please allow [place] 1-2 business days to complete this process.  Drug prices often vary depending on where the prescription is filled and some pharmacies may offer cheaper prices.  The website www.goodrx.com contains coupons for medications through different  pharmacies. The prices here do not account for what the cost may be with help from insurance (it may be cheaper with your insurance), but the website can give you the price if you did not use any insurance.  - You can print the associated coupon and take it with your prescription to the pharmacy.  - You may also stop by our office during regular business hours and pick up a GoodRx coupon card.  - If you need your prescription sent electronically to a different pharmacy, notify our office through [facility] or by phone at [phone] option 4.     Si Usted Necesita Algo Despus de Su Visita  Tambin puede enviarnos un mensaje a travs de Clinical cytogeneticist. Por lo general respondemos a los mensajes de MyChart en el transcurso de 1 a 2 das hbiles.  Para renovar recetas, por favor pida a su farmacia que se ponga en contacto con nuestra oficina. Annie Sable de fax es  el [phone].  Si tiene un asunto urgente cuando la clnica est cerrada y que no puede esperar hasta el siguiente da hbil, puede llamar/localizar a su doctor(a) al nmero que aparece a continuacin.   Por favor, tenga en cuenta que aunque hacemos todo lo posible para estar disponibles para asuntos urgentes fuera del horario de [place], no estamos disponibles las 24 horas del da, los 7 [address]  [address] de la [place].   Si tiene un problema urgente y no puede comunicarse con nosotros, puede optar por buscar atencin mdica  en el consultorio de su doctor(a), en una clnica privada, en un centro de atencin urgente o en una sala de emergencias.  Si tiene Engineer, drilling, por favor llame inmediatamente al 911 o vaya a la sala de emergencias.  Nmeros de bper  - Dr. Gwen Pounds: [phone]  - Dra. Moye: [phone]  - Dra. Roseanne Reno: [phone]  En caso de inclemencias del [place], por favor llame a Lacy Duverney principal al [phone] para una actualizacin sobre el [place] de cualquier retraso o cierre.  Consejos para la  medicacin en dermatologa: Por favor, guarde las cajas en las que vienen los medicamentos de uso tpico para ayudarle a seguir las instrucciones sobre dnde y cmo usarlos. Las farmacias generalmente imprimen las instrucciones del medicamento slo en las cajas y no directamente en los tubos del [employer].   Si su medicamento es muy caro, por favor, pngase en contacto con Rolm Gala llamando al [phone] y presione la opcin 4 o envenos un mensaje a travs de Clinical cytogeneticist.   No podemos decirle cul ser su copago por los medicamentos por adelantado ya que esto es diferente dependiendo de la cobertura de su seguro. Sin embargo, es posible que podamos encontrar un medicamento sustituto a Audiological scientist un formulario para que el seguro cubra el medicamento que se considera necesario.   Si se requiere una autorizacin previa para que su compaa de seguros [place] su medicamento, por favor permtanos de 1 a 2 das hbiles para completar [address]  Los precios de los medicamentos varan con frecuencia dependiendo del Environmental consultant de dnde se surte la receta y alguna farmacias pueden ofrecer precios ms baratos.  El sitio web www.goodrx.com tiene cupones para medicamentos de Health and safety inspector. Los precios aqu no tienen en cuenta lo que podra costar con la ayuda del seguro (puede ser ms barato con su seguro), pero el sitio web puede darle el precio si no utiliz Tourist information centre manager.  - Puede imprimir el cupn correspondiente y llevarlo con su receta a la farmacia.  - Tambin puede pasar por nuestra oficina durante el horario de atencin regular y Education officer, museum una tarjeta de cupones de GoodRx.  - Si necesita que su receta se enve electrnicamente a una farmacia diferente, informe a nuestra oficina a travs de MyChart de [place] o por telfono llamando al [phone] y presione la opcin 4.

## 2023-03-23 NOTE — Progress Notes (Signed)
   Follow-Up Visit   Subjective  [name] is a new patient 57 y.o. female who presents for the following: here today concerning itchy bumps that come and go at b/l hands, arms, back, buttock, and face since 2019 which she scratches. She was recently hospitalized for bacteremia, and she has had a h/o MRSA.   The following portions of the chart were reviewed this encounter and updated as appropriate: medications, allergies, medical history  Review of Systems:  No other skin or systemic complaints except as noted in HPI or Assessment and Plan.  Objective  Well appearing patient in no apparent distress; mood and affect are within normal limits.   A focused examination was performed of the following areas: Face, b/l arms, back, buttock  Relevant exam findings are noted in the Assessment and Plan.  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 Condition is bothersome/symptomatic for patient. Currently flared.   Start doxycycline 100 mg capsule - take 1 cap po bid with food for 2 weeks. After 2 weeks take 1 cap po qd with food. 60 caps 1 rf  Doxycycline should be taken with food to prevent nausea. Do not lay down for 30 minutes after taking. Be cautious with sun exposure and use good sun protection while on this medication. Pregnant women should not take this medication.   Start mometasone cream - apply topically qd/bid to any itchy areas prn. Can use for up to 2 weeks at a time.   Topical steroids (such as triamcinolone, fluocinolone, fluocinonide, mometasone, clobetasol, halobetasol, betamethasone, hydrocortisone) can cause thinning and lightening of the skin if they are used for too long in the same area.  Your physician has selected the right strength medicine for your problem and area affected on the body. Please use your medication only as directed by your physician to prevent side effects.   Start mupirocin 2 % ointment - apply topically to open sores bid  Recommend using a antibacterial soap daily for body/face. Avoid eyes.   Recommend OTC benzoyl peroxide cleanser, wash affected areas daily in shower, let sit several minutes prior to rinsing.  May bleach towels if not rinsed off completely.  Recommended brands include Panoxyl 4% Creamy Wash, CeraVe Acne Foaming Cream wash, or Cetaphil Gentle Clear Complexion-Clearing BPO Acne Cleanser.  Or  Recommend using [employer] daily, leave on for 1-2 minutes before rinsing off. This can be purchased online.   Recommend moisturizing lotion like cetaphil or cerave after shower  Gentle Skin Care Guide included in printout    Return for 6 - 8 week follow up on folliculitis .  I, Asher Muir, CMA, am acting as scribe for Willeen Niece, MD.   Documentation: I have reviewed the above documentation for accuracy and completeness, and I agree with the above.  Willeen Niece, MD

## 2023-03-25 NOTE — Progress Notes (Addendum)
Referring Physician:  Salli Real, MD [address] [place],  [place] [address]  Primary Physician:  Salli Real, MD  History of Present Illness: 03/30/2023 [name] has a history of bipolar, seizure disorder, alcoholism, and uterine CA.   Seen in ED on 03/15/23 with LBP and right leg pain. She had lumbar MRI done and is here for follow up. She also has numbness and tingling in the right leg.   She has constant LBP with lateral/posterior right leg pain to her foot. No left leg pain. She has numbness, tingling, and weakness in right leg. Pain is worse with moving. No alleviating factors.   She is on chronic oxycodone 10mg  from [facility] in [place] Wynelle Link MD).   Bowel/Bladder Dysfunction: none since seen in ED, woke up a few times with BM in bed and had no sensation to go prior to ED visit.   Conservative measures:  Physical therapy: has not participated in  Multimodal medical therapy including regular antiinflammatories: prednisone, tylenol, neurontin, flexeril, oxycodone  Injections:  Had lumbar injections in the past with no relief   Past Surgery:  Cervical Surgery x3 in [facility] has no symptoms of cervical myelopathy.  The symptoms are causing a significant impact on the patient's life.   Review of Systems:  A 10 point review of systems is negative, except for the pertinent positives and negatives detailed in the HPI.  Past Medical History: Past Medical History:  Diagnosis Date   Abnormal uterine bleeding    ADHD (attention deficit hyperactivity disorder)    Allergy    Anxiety    Bipolar 1 disorder (HCC)    Bipolar disorder (HCC)    Cancer (HCC)    uterine cancer   DDD (degenerative disc disease)    Depression    HSV infection    Hx: UTI (urinary tract infection)    Pertussis    Seizures (HCC)    last seizure 12/2014    Thyroid disease     Past Surgical History: Past Surgical History:  Procedure Laterality  Date   ABDOMINAL HYSTERECTOMY     CERVICAL SPINE SURGERY     x 3 2001,2005,2007 c5-6   CESAREAN SECTION  2003   EMBOLIZATION N/A 12/16/2017   Procedure: EMBOLIZATION;  Surgeon: Annice Needy, MD;  Location: [facility] INVASIVE CV LAB;  Service: Cardiovascular;  Laterality: N/A;   ESOPHAGOGASTRODUODENOSCOPY (EGD) WITH PROPOFOL N/A 11/01/2019   Procedure: ESOPHAGOGASTRODUODENOSCOPY (EGD) WITH PROPOFOL;  Surgeon: Earline Mayotte, MD;  Location: [facility] ENDOSCOPY;  Service: Endoscopy;  Laterality: N/A;  POSITIVE ON 09/29/19 and 10/06/19   HYSTEROSCOPY N/A 10/11/2017   Procedure: HYSTEROSCOPY;  Surgeon: Linzie Collin, MD;  Location: [facility] ORS;  Service: Gynecology;  Laterality: N/A;   HYSTEROSCOPY WITH D & C N/A 10/11/2017   Procedure: DILATATION AND CURETTAGE /HYSTEROSCOPY;  Surgeon: Linzie Collin, MD;  Location: [facility] ORS;  Service: Gynecology;  Laterality: N/A;   LAPAROSCOPIC BILATERAL SALPINGO OOPHERECTOMY Bilateral 12/15/2017   Procedure: LAPAROSCOPIC BILATERAL SALPINGO OOPHORECTOMY;  Surgeon: Leida Lauth, MD;  Location: [facility] ORS;  Service: Gynecology;  Laterality: Bilateral;   LAPAROSCOPIC HYSTERECTOMY Bilateral 12/15/2017   Procedure: HYSTERECTOMY TOTAL LAPAROSCOPIC;  Surgeon: Leida Lauth, MD;  Location: [facility] ORS;  Service: Gynecology;  Laterality: Bilateral;   SENTINEL NODE BIOPSY N/A 12/15/2017   Procedure: SENTINEL NODE UEAVWU,[id number];  Surgeon: Leida Lauth, MD;  Location: [facility] ORS;  Service: Gynecology;  Laterality: N/A;   TEE WITHOUT CARDIOVERSION N/A 07/13/2022   Procedure: TRANSESOPHAGEAL  ECHOCARDIOGRAM (TEE);  Surgeon: Antonieta Iba, MD;  Location: [facility] ORS;  Service: Cardiovascular;  Laterality: N/A;   TEE WITHOUT CARDIOVERSION N/A 08/19/2022   Procedure: TRANSESOPHAGEAL ECHOCARDIOGRAM (TEE);  Surgeon: Debbe Odea, MD;  Location: [facility] ORS;  Service: Cardiovascular;  Laterality: N/A;   TONSILLECTOMY     TUBAL LIGATION      Allergies: Allergies as of 03/30/2023  - Review Complete 03/30/2023  Allergen Reaction Noted   Naproxen Anaphylaxis 09/17/2015   Morphine and codeine Itching 03/31/2021    Medications: Outpatient Encounter Medications as of 03/30/2023  Medication Sig   acetaminophen (TYLENOL) 500 MG tablet Take 500-1,000 mg by mouth every 6 (six) hours as needed for mild pain or headache.    amphetamine-dextroamphetamine (ADDERALL) 10 MG tablet Take 10 mg by mouth 2 (two) times daily.   cyclobenzaprine (FLEXERIL) 10 MG tablet Take 10 mg by mouth 3 (three) times daily as needed for muscle spasms.   doxycycline (MONODOX) 100 MG capsule Take 1 capsule (100 mg total) by mouth 2 (two) times daily. Take with food and drink   DULoxetine (CYMBALTA) 30 MG capsule Take 30 mg by mouth 2 (two) times daily.   EPINEPHrine 0.3 mg/0.3 mL IJ SOAJ injection Inject 0.3 mg into the muscle once as needed (severe allergic reaction).   gabapentin (NEURONTIN) 300 MG capsule Take 300 mg by mouth 3 (three) times daily.   mometasone (ELOCON) 0.1 % cream Apply topically qd/bid prn for itchy spots at body/face. Can use up to 2 weeks at a time.   mupirocin ointment (BACTROBAN) 2 % Apply 1 Application topically daily. To any open areas until healed.   Oxycodone HCl 10 MG TABS Take 10 mg by mouth 4 (four) times daily as needed (moderate to severe pain).   No facility-administered encounter medications on file as of 03/30/2023.    Social History: Social History   Tobacco Use   Smoking status: Never   Smokeless tobacco: Never  Vaping Use   Vaping Use: Some days  Substance Use Topics   Alcohol use: Yes    Comment: occassionally    Drug use: Not Currently    Comment: last used in 1990's    Family Medical History: Family History  Problem Relation Age of Onset   Cervical cancer Mother    Cervical cancer Maternal Grandmother    Polycythemia Maternal Grandmother    Stomach cancer Father    Colon cancer Paternal Grandmother    Cervical cancer Cousin    Cervical cancer  Cousin    Diabetes Mellitus II Neg Hx    CAD Neg Hx    Colon polyps Neg Hx    Heart disease Neg Hx    Rectal cancer Neg Hx     Physical Examination: Vitals:   03/30/23 1408  BP: 136/82    General: Patient is well developed, well nourished, calm, collected, and in no apparent distress. Attention to examination is appropriate.  Respiratory: Patient is breathing without any difficulty.   NEUROLOGICAL:     Awake, alert, oriented to person, place, and time.  Speech is clear and fluent. Fund of knowledge is appropriate.   She is tearful at times due to pain.   Cranial Nerves: Pupils equal round and reactive to light.  Facial tone is symmetric.    No posterior lumbar tenderness.   No abnormal lesions on exposed skin.   Strength: Side Biceps Triceps Deltoid Interossei Grip Wrist Ext. Wrist Flex.  R 5 5 5 5 5 5 5   L  5 5 5 5 5 5 5    Side Iliopsoas Quads Hamstring PF DF EHL  R 5 5 5 5 5 5   L 5 5 5 5 5 5    No gross weakness, but she has pain with strength testing in right leg.   She can heel/toe stand.   Reflexes are 2+ and symmetric at the biceps, triceps, brachioradialis, patella and achilles.   Hoffman's is absent.  Clonus is not present.   Bilateral upper and lower extremity sensation is intact to light touch.     She has limping gait favoring right leg.   Medical Decision Making  Imaging: Lumbar MRI dated 03/15/23:  FINDINGS: Segmentation: Transitional anatomy with partial lumbarization of the S1 vertebral body. Numbering of the vertebral bodies is in keeping with the prior examinations.   Alignment: Grade 1 anterolisthesis of L5 on S1 secondary to facet disease.   Vertebrae: No acute fracture, evidence of discitis, or aggressive bone lesion.   Conus medullaris and cauda equina: Conus extends to the L1-2 level. Conus and cauda equina appear normal.   Paraspinal and other soft tissues: No acute paraspinal abnormality.   Disc levels:   Disc spaces:  Degenerative disease with disc height loss at L5-S1. Disc desiccation throughout the lumbar spine. Disc height loss at T11-12 and T12-L1.   T12-L1: Minimal broad-based disc bulge. No foraminal or central canal stenosis.   L1-L2: No significant disc bulge. No neural foraminal stenosis. No central canal stenosis.   L2-L3: No significant disc bulge. No neural foraminal stenosis. No central canal stenosis.   L3-L4: Minimal broad-based disc bulge. No foraminal or central canal stenosis.   L4-L5: Broad-based disc bulge. No foraminal or central canal stenosis.   L5-S1: Broad-based disc bulge. Severe bilateral facet arthropathy with a small right facet effusion and a 10 mm right facet intraspinal synovial cyst with mass effect on the right intraspinal S1 nerve root. Reactive marrow edema on either side of bilateral L4-5 facet joints. Severe spinal stenosis. Moderate bilateral foraminal stenosis.   IMPRESSION: 1. At L5-S1 there is a broad-based disc bulge. Severe bilateral facet arthropathy with a small right facet effusion and a 10 mm right facet intraspinal synovial cyst with mass effect on the right intraspinal S1 nerve root. Reactive marrow edema on either side of bilateral L4-5 facet joints. Severe spinal stenosis. Moderate bilateral foraminal stenosis. 2. No acute osseous injury of the lumbar spine.     Electronically Signed   By: Elige Ko M.D.   On: 03/15/2023 12:54    I have personally reviewed the images and agree with the above interpretation.  Assessment and Plan: Ms. Faraone is a pleasant 57 y.o. female has 3-4 week history of constant LBP with lateral/posterior right leg pain to her foot. No left leg pain. She has numbness, tingling, and weakness in right leg.   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 This is likely her pain generator.   Treatment options discussed with  patient and following plan made:   - Order for physical therapy for lumbar spine to [facility]. Patient to call to schedule appointment.  - Discuss any change in pain medication with outside pain management provider. I am happy to speak with them if needed.  - Referral to PMR at [facility] to discuss possible lumbar injections.  - Discussed she is likely a surgical candidate if no improvement with above. Would need lumbar flex/ext xrays prior to seeing  Dr. Myer Haff. May need DEXA as well.  - Follow up with me in 6 weeks for recheck.  - Reviewed red flag symptoms.   I spent a total of 35 minutes in face-to-face and non-face-to-face activities related to this patient's care today including review of outside records, review of imaging, review of symptoms, physical exam, discussion of differential diagnosis, discussion of treatment options, and documentation.   Thank you for involving me in the care of this patient.   ADDENDUM 04/28/23:  PT discharge note scanned into media. Went to 3 visits and did self discharge as she was not responding as expected.   Drake Leach PA-C Dept. of Neurosurgery

## 2023-03-30 ENCOUNTER — Ambulatory Visit (INDEPENDENT_AMBULATORY_CARE_PROVIDER_SITE_OTHER): Payer: 59 | Admitting: Orthopedic Surgery

## 2023-03-30 ENCOUNTER — Encounter: Payer: Self-pay | Admitting: Orthopedic Surgery

## 2023-03-30 VITALS — BP 136/82 | Ht 61.0 in | Wt 127.2 lb

## 2023-03-30 DIAGNOSIS — M7138 Other bursal cyst, other site: Secondary | ICD-10-CM | POA: Diagnosis not present

## 2023-03-30 DIAGNOSIS — M48061 Spinal stenosis, lumbar region without neurogenic claudication: Secondary | ICD-10-CM | POA: Diagnosis not present

## 2023-03-30 DIAGNOSIS — M47816 Spondylosis without myelopathy or radiculopathy, lumbar region: Secondary | ICD-10-CM

## 2023-03-30 DIAGNOSIS — M5416 Radiculopathy, lumbar region: Secondary | ICD-10-CM

## 2023-03-30 DIAGNOSIS — M4726 Other spondylosis with radiculopathy, lumbar region: Secondary | ICD-10-CM

## 2023-03-30 DIAGNOSIS — M4316 Spondylolisthesis, lumbar region: Secondary | ICD-10-CM | POA: Diagnosis not present

## 2023-03-30 NOTE — Patient Instructions (Signed)
It was so nice to see you today. Thank you so much for coming in.    You have spinal stenosis at L5-S1 with a synovial cyst on right and slip- I think all of this is causing your back and leg pain.   I sent physical therapy orders to [facility]. You can call them at [phone] if you don't hear from them to schedule your visit. If PT discharges you before your follow up with me, let me know.   I want you to see physical medicine and rehab at the [facility] to discuss possible injections in your lower back. Dr. Yves Dill, Dr. Mariah Milling, and their PA Alphonzo Lemmings are great and will take good care of you. They should call you to schedule an appointment or you can call them at [phone].   If no improvement with these things, we may need to have you see Dr. Myer Haff to discuss possible surgery options.   I will see you back in 6 weeks. Please do not hesitate to call if you have any questions or concerns. You can also message me in MyChart.   If you have any new weakness in your legs or incontinence of bowels/bladder then go to the emergency room.   Drake Leach PA-C [phone]

## 2023-04-28 ENCOUNTER — Ambulatory Visit (INDEPENDENT_AMBULATORY_CARE_PROVIDER_SITE_OTHER): Payer: 59 | Admitting: Dermatology

## 2023-04-28 VITALS — BP 138/84 | HR 86

## 2023-04-28 DIAGNOSIS — R21 Rash and other nonspecific skin eruption: Secondary | ICD-10-CM | POA: Diagnosis not present

## 2023-04-28 DIAGNOSIS — L299 Pruritus, unspecified: Secondary | ICD-10-CM | POA: Diagnosis not present

## 2023-04-28 MED ORDER — PERMETHRIN 5 % EX CREA: TOPICAL_CREAM | CUTANEOUS | 1 refills | Status: DC

## 2023-04-28 MED ORDER — CLINDAMYCIN PHOSPHATE 1 % EX LOTN: TOPICAL_LOTION | CUTANEOUS | 1 refills | Status: DC

## 2023-04-28 NOTE — Patient Instructions (Signed)
Due to recent changes in healthcare laws, you may see results of your pathology and/or laboratory studies on MyChart before the doctors have had a chance to review them. We understand that in some cases there may be results that are confusing or concerning to you. Please understand that not all results are received at the same time and often the doctors may need to interpret multiple results in order to provide you with the best plan of care or course of treatment. Therefore, we ask that you please give us 2 business days to thoroughly review all your results before contacting the office for clarification. Should we see a critical lab result, you will be contacted sooner.   If You Need Anything After Your Visit  If you have any questions or concerns for your doctor, please call our main line at [phone] and press option 4 to reach your doctor's medical assistant. If no one answers, please leave a voicemail as directed and we will return your call as soon as possible. Messages left after 4 pm will be answered the following business day.   You may also send us a message via MyChart. We typically respond to MyChart messages within 1-2 business days.  For prescription refills, please ask your pharmacy to contact our office. Our fax number is [phone].  If you have an urgent issue when the clinic is closed that cannot wait until the next business day, you can page your doctor at the number below.    Please note that while we do our best to be available for urgent issues outside of office hours, we are not available 24/7.   If you have an urgent issue and are unable to reach us, you may choose to seek medical care at your doctor's office, retail clinic, urgent care center, or emergency room.  If you have a medical emergency, please immediately call 911 or go to the emergency department.  Pager Numbers  - Dr. Kowalski: [phone]  - Dr. Moye: [phone]  - Dr. Stewart:  [phone]  In the event of inclement weather, please call our main line at [phone] for an update on the status of any delays or closures.  Dermatology Medication Tips: Please keep the boxes that topical medications come in in order to help keep track of the instructions about where and how to use these. Pharmacies typically print the medication instructions only on the boxes and not directly on the medication tubes.   If your medication is too expensive, please contact our office at [phone] option 4 or send us a message through MyChart.   We are unable to tell what your co-pay for medications will be in advance as this is different depending on your insurance coverage. However, we may be able to find a substitute medication at lower cost or fill out paperwork to get insurance to cover a needed medication.   If a prior authorization is required to get your medication covered by your insurance company, please allow us 1-2 business days to complete this process.  Drug prices often vary depending on where the prescription is filled and some pharmacies may offer cheaper prices.  The website www.goodrx.com contains coupons for medications through different pharmacies. The prices here do not account for what the cost may be with help from insurance (it may be cheaper with your insurance), but the website can give you the price if you did not use any insurance.  - You can print the associated coupon and take it with   your prescription to the pharmacy.  - You may also stop by our office during regular business hours and pick up a GoodRx coupon card.  - If you need your prescription sent electronically to a different pharmacy, notify our office through [facility] MyChart or by phone at [phone] option 4.     Si Usted Necesita Algo Despus de Su Visita  Tambin puede enviarnos un mensaje a travs de MyChart. Por lo general respondemos a los mensajes de MyChart en el transcurso de 1 a 2  das hbiles.  Para renovar recetas, por favor pida a su farmacia que se ponga en contacto con nuestra oficina. Nuestro nmero de fax es el [phone].  Si tiene un asunto urgente cuando la clnica est cerrada y que no puede esperar hasta el siguiente da hbil, puede llamar/localizar a su doctor(a) al nmero que aparece a continuacin.   Por favor, tenga en cuenta que aunque hacemos todo lo posible para estar disponibles para asuntos urgentes fuera del horario de oficina, no estamos disponibles las 24 horas del da, los 7 das de la semana.   Si tiene un problema urgente y no puede comunicarse con nosotros, puede optar por buscar atencin mdica  en el consultorio de su doctor(a), en una clnica privada, en un centro de atencin urgente o en una sala de emergencias.  Si tiene una emergencia mdica, por favor llame inmediatamente al 911 o vaya a la sala de emergencias.  Nmeros de bper  - Dr. Kowalski: [phone]  - Dra. Moye: [phone]  - Dra. Stewart: [phone]  En caso de inclemencias del tiempo, por favor llame a nuestra lnea principal al [phone] para una actualizacin sobre el estado de cualquier retraso o cierre.  Consejos para la medicacin en dermatologa: Por favor, guarde las cajas en las que vienen los medicamentos de uso tpico para ayudarle a seguir las instrucciones sobre dnde y cmo usarlos. Las farmacias generalmente imprimen las instrucciones del medicamento slo en las cajas y no directamente en los tubos del medicamento.   Si su medicamento es muy caro, por favor, pngase en contacto con nuestra oficina llamando al [phone] y presione la opcin 4 o envenos un mensaje a travs de MyChart.   No podemos decirle cul ser su copago por los medicamentos por adelantado ya que esto es diferente dependiendo de la cobertura de su seguro. Sin embargo, es posible que podamos encontrar un medicamento sustituto a menor costo o llenar un formulario para que el  seguro cubra el medicamento que se considera necesario.   Si se requiere una autorizacin previa para que su compaa de seguros cubra su medicamento, por favor permtanos de 1 a 2 das hbiles para completar este proceso.  Los precios de los medicamentos varan con frecuencia dependiendo del lugar de dnde se surte la receta y alguna farmacias pueden ofrecer precios ms baratos.  El sitio web www.goodrx.com tiene cupones para medicamentos de diferentes farmacias. Los precios aqu no tienen en cuenta lo que podra costar con la ayuda del seguro (puede ser ms barato con su seguro), pero el sitio web puede darle el precio si no utiliz ningn seguro.  - Puede imprimir el cupn correspondiente y llevarlo con su receta a la farmacia.  - Tambin puede pasar por nuestra oficina durante el horario de atencin regular y recoger una tarjeta de cupones de GoodRx.  - Si necesita que su receta se enve electrnicamente a una farmacia diferente, informe a nuestra oficina a travs de MyChart de [facility]   o por telfono llamando al [phone] y presione la opcin 4.  

## 2023-04-28 NOTE — Progress Notes (Signed)
   Follow-Up Visit   Subjective  [name] is a 57 y.o. female who presents for the following: 2 months f/u on folliculitis on her body, treating with Doxycycline 100 mg once a day, Mometasone cream and Mupirocin ointment with a fair response, skin still itching, especially at hands and buttocks.  No one else itching around her.  Worse at night. She has h/o mrsa   The following portions of the chart were reviewed this encounter and updated as appropriate: medications, allergies, medical history  Review of Systems:  No other skin or systemic complaints except as noted in HPI or Assessment and Plan.  Objective  Well appearing patient in no apparent distress; mood and affect are within normal limits.  A focused examination was performed of the following areas: face,hands,arms,buttock   Relevant exam findings are noted in the Assessment and Plan.    Assessment & Plan   Rash with PRURITUS  POSSIBLE SCABIES vrs folliculitis Exam:  Pink/Violaceous macules on the gluteal cleft, hands, wrist and face with scattered healing crusted excoriations, pink macules on face  Chronic and persistent condition with duration or expected duration over one year. Condition is symptomatic/ bothersome to patient. Not currently at goal.   Treatment Plan:  Start Clindamycin lotion apply to face once or twice a day  Start Elimite cream apply from neck to toes at bedtimes leave on wash off in the morning, repeat in 1 week   Continue Doxycycline 100 take 1-2 tablets daily as directed prn flares  Continue Mometasone cream once or twice a day on itchy areas on body Continue Mupirocin ointment apply to open sores once or twice a day  Discussed highly contagious condition.  All household members/close contacts should be treated at same time.  After treatment with topical Elimite cream applied as directed overnight (and/or oral ivermectin), wash all bed sheets, towels, and recently worn clothing in hot  water, and dry on high heat.  Repeat entire process in one week.   Rash and nonspecific skin eruption  Pruritus    Return in about 4 weeks (around 05/26/2023) for folliculitis, pruritus/possible scabies.  I, Angelique Holm, CMA, am acting as scribe for Willeen Niece, MD .   Documentation: I have reviewed the above documentation for accuracy and completeness, and I agree with the above.  Willeen Niece, MD

## 2023-05-06 ENCOUNTER — Ambulatory Visit: Payer: 59 | Admitting: Internal Medicine

## 2023-05-06 ENCOUNTER — Encounter: Payer: Self-pay | Admitting: Internal Medicine

## 2023-05-06 VITALS — BP 122/80 | HR 76 | Ht 61.0 in | Wt 130.2 lb

## 2023-05-06 DIAGNOSIS — R7989 Other specified abnormal findings of blood chemistry: Secondary | ICD-10-CM | POA: Diagnosis not present

## 2023-05-06 NOTE — Progress Notes (Signed)
Name: [name]  MRN/ DOB: [id number], [birth year]    Age/ Sex: 57 y.o., female    PCP: Salli Real, MD   Reason for Endocrinology Evaluation: Low cortisol     Date of Initial Endocrinology Evaluation: 05/06/2023     HPI: [name] is a 57 y.o. female with a past medical history of degenerative disc disease, bipolar disorder, IVDU. The patient presented for initial endocrinology clinic visit on 05/06/2023 for consultative assistance with her low cortisol.   During evaluation for gram-negative bacteremia 06/2022 the patient has been noted with low cortisol at 4.6 UG/DL.  She had a normal ACTH at 28.9 PG/mL (7.2-63.3).  She was also noted to have low DHEA-S level at 15 UG/DL (78.2-956.2)  Patient had a cosyntropin stimulation test on 07/11/2022 with a baseline serum cortisol of 2.7 UG/DL, 13-YQMVHQ cortisol level at 16.1 UG/DL.  Of note, the patient has been noted with low albumin level and total protein level through that period of time  Patient with history of IVDU  CT abdomen 07/13/2022 revealed normal adrenal glands.   Today she is accompanied by her spouse   She used to get back intra-articular injections , last dose in ~ 2023 She received oral prednisone for back pain 02/24/2023  She denies weight loss  Denies dizziness  She has hx of syncope summer 2023 prior to dx of bacteremia Has occasional diarrhea  Has rare episodes of nausea with vomiting     No Fh of adrenal disease Father and Mother with thyroid disease       HISTORY:  Past Medical History:  Past Medical History:  Diagnosis Date   Abnormal uterine bleeding    ADHD (attention deficit hyperactivity disorder)    Allergy    Anxiety    Bipolar 1 disorder (HCC)    Bipolar disorder (HCC)    Cancer (HCC)    uterine cancer   DDD (degenerative disc disease)    Depression    HSV infection    Hx: UTI (urinary tract infection)    Pertussis    Seizures (HCC)    last seizure 12/2014     Thyroid disease    Past Surgical History:  Past Surgical History:  Procedure Laterality Date   ABDOMINAL HYSTERECTOMY     CERVICAL SPINE SURGERY     x 3 2001,2005,2007 c5-6   CESAREAN SECTION  2003   EMBOLIZATION N/A 12/16/2017   Procedure: EMBOLIZATION;  Surgeon: Annice Needy, MD;  Location: [facility];  Service: Cardiovascular;  Laterality: N/A;   ESOPHAGOGASTRODUODENOSCOPY (EGD) WITH PROPOFOL N/A 11/01/2019   Procedure: ESOPHAGOGASTRODUODENOSCOPY (EGD) WITH PROPOFOL;  Surgeon: Earline Mayotte, MD;  Location: [facility];  Service: Endoscopy;  Laterality: N/A;  POSITIVE ON 09/29/19 and 10/06/19   HYSTEROSCOPY N/A 10/11/2017   Procedure: HYSTEROSCOPY;  Surgeon: Linzie Collin, MD;  Location: [facility];  Service: Gynecology;  Laterality: N/A;   HYSTEROSCOPY WITH D & C N/A 10/11/2017   Procedure: DILATATION AND CURETTAGE /HYSTEROSCOPY;  Surgeon: Linzie Collin, MD;  Location: [facility];  Service: Gynecology;  Laterality: N/A;   LAPAROSCOPIC BILATERAL SALPINGO OOPHERECTOMY Bilateral 12/15/2017   Procedure: LAPAROSCOPIC BILATERAL SALPINGO OOPHORECTOMY;  Surgeon: Leida Lauth, MD;  Location: [facility];  Service: Gynecology;  Laterality: Bilateral;   LAPAROSCOPIC HYSTERECTOMY Bilateral 12/15/2017   Procedure: HYSTERECTOMY TOTAL LAPAROSCOPIC;  Surgeon: Leida Lauth, MD;  Location: [facility];  Service: Gynecology;  Laterality: Bilateral;   SENTINEL NODE BIOPSY N/A 12/15/2017   Procedure:  SENTINEL NODE GNFAOZ,HYQMVHQ;  Surgeon: Leida Lauth, MD;  Location: [facility];  Service: Gynecology;  Laterality: N/A;   TEE WITHOUT CARDIOVERSION N/A 07/13/2022   Procedure: TRANSESOPHAGEAL ECHOCARDIOGRAM (TEE);  Surgeon: Antonieta Iba, MD;  Location: [facility];  Service: Cardiovascular;  Laterality: N/A;   TEE WITHOUT CARDIOVERSION N/A 08/19/2022   Procedure: TRANSESOPHAGEAL ECHOCARDIOGRAM (TEE);  Surgeon: Debbe Odea, MD;  Location: [facility];  Service: Cardiovascular;   Laterality: N/A;   TONSILLECTOMY     TUBAL LIGATION      Social History:  reports that she has never smoked. She uses smokeless tobacco. She reports current alcohol use. She reports that she does not currently use drugs. Family History: family history includes Cervical cancer in her cousin, cousin, maternal grandmother, and mother; Colon cancer in her paternal grandmother; Polycythemia in her maternal grandmother; Stomach cancer in her father.   HOME MEDICATIONS: Allergies as of 05/06/2023       Reactions   Naproxen Anaphylaxis   Morphine And Codeine Itching        Medication List        Accurate as of May 06, 2023 10:48 AM. If you have any questions, ask your nurse or doctor.          acetaminophen 500 MG tablet Commonly known as: TYLENOL Take 500-1,000 mg by mouth every 6 (six) hours as needed for mild pain or headache.   amphetamine-dextroamphetamine 10 MG tablet Commonly known as: ADDERALL Take 10 mg by mouth 2 (two) times daily.   clindamycin 1 % lotion Commonly known as: Cleocin-T Apply to face once or twice a day   cyclobenzaprine 10 MG tablet Commonly known as: FLEXERIL Take 10 mg by mouth 3 (three) times daily as needed for muscle spasms.   doxycycline 100 MG capsule Commonly known as: MONODOX Take 1 capsule (100 mg total) by mouth 2 (two) times daily. Take with food and drink   DULoxetine 30 MG capsule Commonly known as: CYMBALTA Take 30 mg by mouth 2 (two) times daily.   EPINEPHrine 0.3 mg/0.3 mL Soaj injection Commonly known as: EPI-PEN Inject 0.3 mg into the muscle once as needed (severe allergic reaction).   gabapentin 300 MG capsule Commonly known as: NEURONTIN Take 300 mg by mouth 3 (three) times daily.   mometasone 0.1 % cream Commonly known as: ELOCON Apply topically qd/bid prn for itchy spots at body/face. Can use up to 2 weeks at a time.   mupirocin ointment 2 % Commonly known as: BACTROBAN Apply 1 Application topically daily. To  any open areas until healed.   Oxycodone HCl 10 MG Tabs Take 10 mg by mouth 4 (four) times daily as needed (moderate to severe pain).   oxyCODONE 15 MG immediate release tablet Commonly known as: ROXICODONE Take 15 mg by mouth 4 (four) times daily as needed.   permethrin 5 % cream Commonly known as: Elimite Apply from neck to toes at bedtime wash off in the morning, repeat in 1 week          REVIEW OF SYSTEMS: A comprehensive ROS was conducted with the patient and is negative except as per HPI   OBJECTIVE:  VS: BP 122/80 (BP Location: Left Arm, Patient Position: Sitting, Cuff Size: Small)   Pulse 76   Ht 5\' 1"  (1.549 m)   Wt 130 lb 3.2 oz (59.1 kg)   LMP 09/22/2017 (Exact Date)   SpO2 99%   BMI 24.60 kg/m    Wt Readings from Last 3 Encounters:  05/06/23 130 lb 3.2 oz (59.1 kg)  03/30/23 127 lb 3.2 oz (57.7 kg)  03/15/23 131 lb (59.4 kg)     EXAM: General: Pt appears well and is in NAD  Eyes: External eye exam normal without stare, lid lag or exophthalmos.  EOM intact.    Neck: General: Supple without adenopathy. Thyroid: Thyroid size normal.  No goiter or nodules appreciated.   Lungs: Clear with good BS bilat   Heart: Auscultation: RRR.  Abdomen: Soft, nontender  Extremities:  BL LE: No pretibial edema   Mental Status: Judgment, insight: Intact Orientation: Oriented to time, place, and person Mood and affect: No depression, anxiety, or agitation     DATA REVIEWED:     ASSESSMENT/PLAN/RECOMMENDATIONS:   Low cortisol  -No clinical suspicion for adrenal insufficiency at this time -Her low cortisol was noted during hospitalization for sepsis secondary to bacteremia, which could be the main reason for abnormal cortisol level.  She also had received intra-articular back injections prior to her hospitalization which could be another contributing factor -ACTH during hospitalization was normal -Another reason for low serum cortisol is low corticotropin binding  globulin, as her albumin and protein have been low during that period of time -We have discussed proceeding with repeating cosyntropin stimulation testing, she will return in September for this as she had a course of prednisone in May. -We discussed pathophysiology of adrenal insufficiency, we discussed the role and function of cortisol    Follow-up pending lab results  Signed electronically by: Lyndle Herrlich, MD  [facility] Endocrinology  [facility] [address] [place] [place], [place] [address] Phone: [phone] FAX: [phone]   CC: Salli Real, MD [address] [place] [address] Phone: [phone] Fax: [phone]   Return to Endocrinology clinic as below: Future Appointments  Date Time Provider Department Center  05/11/2023  1:30 PM Drake Leach, PA-C [facility] None  07/20/2023  3:00 PM Willeen Niece, MD [facility] None

## 2023-05-10 NOTE — Progress Notes (Unsigned)
Referring Physician:  Salli Real, MD [address] [place],  [place] [address]  Primary Physician:  Salli Real, MD  History of Present Illness: 05/11/23 Ms. Alphonsus Sias has a history of bipolar, seizure disorder, alcoholism, and uterine CA.   Last seen by me on 03/30/23 for back and right leg pain. 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 This is likely her pain generator.   She was sent to PT at [facility]. PT discharge note scanned into media. Went to 3 visits and stopped going as she was not responding as expected.   She was referred to PMR for possible injections- she thinks that she has an appointment tomorrow.   She is here for follow up.   She continues with constant LBP with lateral/posterior right leg pain to her foot. She is having some left buttock pain as well. No leg pain. She has numbness, tingling, and weakness in right leg. Pain is worse with moving. No alleviating factors.   She is on chronic oxycodone 10mg  from [facility] in [place] Wynelle Link MD).   Bowel/Bladder Dysfunction: none   Conservative measures:  Physical therapy: PT discharge note scanned into media. Went to 3 visits from 04/12/23-04/22/23 and did self discharge as she was not responding as expected.  Multimodal medical therapy including regular antiinflammatories: prednisone, tylenol, neurontin, flexeril, oxycodone  Injections:  Had lumbar injections in the past with no relief   Past Surgery:  Cervical Surgery x3 in [facility] has no symptoms of cervical myelopathy.  The symptoms are causing a significant impact on the patient's life.   Review of Systems:  A 10 point review of systems is negative, except for the pertinent positives and negatives detailed in the HPI.  Past Medical History: Past Medical History:  Diagnosis Date   Abnormal uterine bleeding    ADHD (attention deficit  hyperactivity disorder)    Allergy    Anxiety    Bipolar 1 disorder (HCC)    Bipolar disorder (HCC)    Cancer (HCC)    uterine cancer   DDD (degenerative disc disease)    Depression    HSV infection    Hx: UTI (urinary tract infection)    Pertussis    Seizures (HCC)    last seizure 12/2014    Thyroid disease     Past Surgical History: Past Surgical History:  Procedure Laterality Date   ABDOMINAL HYSTERECTOMY     CERVICAL SPINE SURGERY     x 3 2001,2005,2007 c5-6   CESAREAN SECTION  2003   EMBOLIZATION N/A 12/16/2017   Procedure: EMBOLIZATION;  Surgeon: Annice Needy, MD;  Location: [facility];  Service: Cardiovascular;  Laterality: N/A;   ESOPHAGOGASTRODUODENOSCOPY (EGD) WITH PROPOFOL N/A 11/01/2019   Procedure: ESOPHAGOGASTRODUODENOSCOPY (EGD) WITH PROPOFOL;  Surgeon: Earline Mayotte, MD;  Location: [facility];  Service: Endoscopy;  Laterality: N/A;  POSITIVE ON 09/29/19 and 10/06/19   HYSTEROSCOPY N/A 10/11/2017   Procedure: HYSTEROSCOPY;  Surgeon: Linzie Collin, MD;  Location: [facility];  Service: Gynecology;  Laterality: N/A;   HYSTEROSCOPY WITH D & C N/A 10/11/2017   Procedure: DILATATION AND CURETTAGE /HYSTEROSCOPY;  Surgeon: Linzie Collin, MD;  Location: [facility];  Service: Gynecology;  Laterality: N/A;   LAPAROSCOPIC BILATERAL SALPINGO OOPHERECTOMY Bilateral 12/15/2017   Procedure: LAPAROSCOPIC BILATERAL SALPINGO OOPHORECTOMY;  Surgeon: Leida Lauth, MD;  Location: [facility];  Service: Gynecology;  Laterality: Bilateral;  LAPAROSCOPIC HYSTERECTOMY Bilateral 12/15/2017   Procedure: HYSTERECTOMY TOTAL LAPAROSCOPIC;  Surgeon: Leida Lauth, MD;  Location: [facility];  Service: Gynecology;  Laterality: Bilateral;   SENTINEL NODE BIOPSY N/A 12/15/2017   Procedure: SENTINEL NODE NUUVOZ,DGUYQIH;  Surgeon: Leida Lauth, MD;  Location: [facility];  Service: Gynecology;  Laterality: N/A;   TEE WITHOUT CARDIOVERSION N/A 07/13/2022   Procedure:  TRANSESOPHAGEAL ECHOCARDIOGRAM (TEE);  Surgeon: Antonieta Iba, MD;  Location: [facility];  Service: Cardiovascular;  Laterality: N/A;   TEE WITHOUT CARDIOVERSION N/A 08/19/2022   Procedure: TRANSESOPHAGEAL ECHOCARDIOGRAM (TEE);  Surgeon: Debbe Odea, MD;  Location: [facility];  Service: Cardiovascular;  Laterality: N/A;   TONSILLECTOMY     TUBAL LIGATION      Allergies: Allergies as of 05/11/2023 - Review Complete 05/06/2023  Allergen Reaction Noted   Naproxen Anaphylaxis 09/17/2015   Morphine and codeine Itching 03/31/2021    Medications: Outpatient Encounter Medications as of 05/11/2023  Medication Sig   acetaminophen (TYLENOL) 500 MG tablet Take 500-1,000 mg by mouth every 6 (six) hours as needed for mild pain or headache.    amphetamine-dextroamphetamine (ADDERALL) 10 MG tablet Take 10 mg by mouth 2 (two) times daily.   clindamycin (CLEOCIN-T) 1 % lotion Apply to face once or twice a day   cyclobenzaprine (FLEXERIL) 10 MG tablet Take 10 mg by mouth 3 (three) times daily as needed for muscle spasms.   doxycycline (MONODOX) 100 MG capsule Take 1 capsule (100 mg total) by mouth 2 (two) times daily. Take with food and drink   DULoxetine (CYMBALTA) 30 MG capsule Take 30 mg by mouth 2 (two) times daily.   EPINEPHrine 0.3 mg/0.3 mL IJ SOAJ injection Inject 0.3 mg into the muscle once as needed (severe allergic reaction).   gabapentin (NEURONTIN) 300 MG capsule Take 300 mg by mouth 3 (three) times daily.   mometasone (ELOCON) 0.1 % cream Apply topically qd/bid prn for itchy spots at body/face. Can use up to 2 weeks at a time.   mupirocin ointment (BACTROBAN) 2 % Apply 1 Application topically daily. To any open areas until healed.   oxyCODONE (ROXICODONE) 15 MG immediate release tablet Take 15 mg by mouth 4 (four) times daily as needed.   Oxycodone HCl 10 MG TABS Take 10 mg by mouth 4 (four) times daily as needed (moderate to severe pain).   permethrin (ELIMITE) 5 % cream Apply from  neck to toes at bedtime wash off in the morning, repeat in 1 week   No facility-administered encounter medications on file as of 05/11/2023.    Social History: Social History   Tobacco Use   Smoking status: Never   Smokeless tobacco: Current  Vaping Use   Vaping status: Some Days  Substance Use Topics   Alcohol use: Yes    Comment: occassionally    Drug use: Not Currently    Comment: last used in 1990's    Family Medical History: Family History  Problem Relation Age of Onset   Cervical cancer Mother    Cervical cancer Maternal Grandmother    Polycythemia Maternal Grandmother    Stomach cancer Father    Colon cancer Paternal Grandmother    Cervical cancer Cousin    Cervical cancer Cousin    Diabetes Mellitus II Neg Hx    CAD Neg Hx    Colon polyps Neg Hx    Heart disease Neg Hx    Rectal cancer Neg Hx     Physical Examination: There were no vitals filed for this  visit.    Awake, alert, oriented to person, place, and time.  Speech is clear and fluent. Fund of knowledge is appropriate.   Cranial Nerves: Pupils equal round and reactive to light.  Facial tone is symmetric.    No posterior lumbar tenderness.   No abnormal lesions on exposed skin.   Strength:  Side Iliopsoas Quads Hamstring PF DF EHL  R 5 5 5 5 5 5   L 5 5 5 5 5 5    She can heel/toe stand.   Clonus is not present.   Bilateral lower extremity sensation is intact to light touch.     She has slight limping gait favoring right leg.   Medical Decision Making  Imaging: Nothing new to review.   Assessment and Plan: Ms. Vancamp is a pleasant 57 y.o. female has constant LBP with lateral/posterior right leg pain to her foot. She is having some left buttock pain as well. No left leg pain.   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 This is likely her pain generator.   Treatment options discussed with patient and  following plan made:   - Keep appointment with PMR at [facility] tomorrow to discuss injections.  - Okay to hold on PT for now, but discussed we would need to revisit it prior to considering any surgery. Would need lumbar flex/ext xrays prior to seeing Dr. Myer Haff. May need DEXA as well.  - Continue pain medications from pain management.  - Follow up with me in 6 weeks for a phone visit.   I spent a total of 15 minutes in face-to-face and non-face-to-face activities related to this patient's care today including review of outside records, review of imaging, review of symptoms, physical exam, discussion of differential diagnosis, discussion of treatment options, and documentation.   Drake Leach PA-C Dept. of Neurosurgery

## 2023-05-11 ENCOUNTER — Encounter: Payer: Self-pay | Admitting: Orthopedic Surgery

## 2023-05-11 ENCOUNTER — Ambulatory Visit (INDEPENDENT_AMBULATORY_CARE_PROVIDER_SITE_OTHER): Payer: 59 | Admitting: Orthopedic Surgery

## 2023-05-11 VITALS — BP 110/68 | Ht 61.0 in | Wt 130.2 lb

## 2023-05-11 DIAGNOSIS — M7138 Other bursal cyst, other site: Secondary | ICD-10-CM | POA: Diagnosis not present

## 2023-05-11 DIAGNOSIS — M48061 Spinal stenosis, lumbar region without neurogenic claudication: Secondary | ICD-10-CM | POA: Diagnosis not present

## 2023-05-11 DIAGNOSIS — M4726 Other spondylosis with radiculopathy, lumbar region: Secondary | ICD-10-CM

## 2023-05-11 DIAGNOSIS — M47816 Spondylosis without myelopathy or radiculopathy, lumbar region: Secondary | ICD-10-CM

## 2023-05-11 DIAGNOSIS — M4316 Spondylolisthesis, lumbar region: Secondary | ICD-10-CM | POA: Diagnosis not present

## 2023-05-11 DIAGNOSIS — M5416 Radiculopathy, lumbar region: Secondary | ICD-10-CM

## 2023-05-11 NOTE — Patient Instructions (Signed)
It was so nice to see you today. Thank you so much for coming in.    I want you to see physical medicine and rehab at the [facility] to discuss possible injections in your lower back. Dr. Yves Dill, Dr. Mariah Milling, and their PA Alphonzo Lemmings are great and will take good care of you. They should call you to schedule an appointment or you can call them at [phone].     We have a phone visit set up in 6 weeks.   Call or message me if you need anything.   Kennyth Arnold

## 2023-06-25 NOTE — Progress Notes (Deleted)
Telephone Visit- Progress Note: Referring Physician:  Salli Real, MD [address] [place],  [place] [address]  Primary Physician:  Salli Real, MD  This visit was performed via telephone.  Patient location: home Provider location: office  I spent a total of *** minutes non-face-to-face activities for this visit on the date of this encounter including review of current clinical condition and response to treatment.    Patient has given verbal consent to this telephone visits and we reviewed the limitations of a telephone visit. Patient wishes to proceed.    Chief Complaint:  follow up  History of Present Illness: SERIYAH TONGA is a 57 y.o. female has a history of bipolar, seizure disorder, alcoholism, and uterine CA.    Last seen by me on 05/11/23 for back and right leg pain. 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 This is likely her pain generator.   She had stopped PT due to pain. She was to see PMR to discuss injections.    She saw Dr. Mariah Milling on 05/26/23. Injections deferred untila fter 07/08/23 as she is scheduled to have her cortisol levels checked. She was sent back to PT at [place].   Phone visit scheduled for follow up.          She was sent to PT at [facility]. PT discharge note scanned into media. Went to 3 visits and stopped going as she was not responding as expected.    She was referred to PMR for possible injections- she thinks that she has an appointment tomorrow.    She is here for follow up.    She continues with constant LBP with lateral/posterior right leg pain to her foot. She is having some left buttock pain as well. No leg pain. She has numbness, tingling, and weakness in right leg. Pain is worse with moving. No alleviating factors.        She is on chronic oxycodone 10mg  from [facility] in [place] Wynelle Link MD).    Bowel/Bladder Dysfunction: none     Conservative measures:  Physical therapy: PT discharge note scanned into media. Went to 3 visits from 04/12/23-04/22/23 and did self discharge as she was not responding as expected.  Multimodal medical therapy including regular antiinflammatories: prednisone, tylenol, neurontin, flexeril, oxycodone  Injections:  Had lumbar injections in the past with no relief    Past Surgery:  [facility] has no symptoms of cervical myelopathy.   The symptoms are causing a significant impact on the patient's life.    Exam: No exam done as this was a telephone encounter.     Imaging: Nothing new to review.   Assessment and Plan: Ms. Barros is a pleasant 57 y.o. female constant LBP with lateral/posterior right leg pain to her foot. She is having some left buttock pain as well. No left leg pain.    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 This is likely her pain generator.    Treatment options discussed with patient and following plan made:    - Keep appointment with PMR at [facility] tomorrow to discuss injections.  - Okay to hold on PT for now, but discussed we would need to revisit it prior to considering any surgery. Would need lumbar flex/ext xrays prior to seeing Dr. Myer Haff. May need DEXA as well.  - Continue pain  medications from pain management.  - Follow up with me in 6 weeks for a phone visit.   Treatment options discussed with patient and following plan made:   - Order for physical therapy for *** spine ***. Patient to call to schedule appointment. *** - Continue current medications including ***. Reviewed dosing and side effects.  - Prescription for ***. Reviewed dosing and side effects. Take with food.  - Prescription for *** to take prn muscle spasms. Reviewed dosing and side effects. Discussed this can cause drowsiness.  - MRI of *** to further evaluate ***  radiculopathy. No improvement time or medications (***).  - Referral to PMR at [facility] to discuss possible *** injections.  - Will schedule phone visit to review MRI results once I get them back.   Drake Leach PA-C Neurosurgery

## 2023-06-29 ENCOUNTER — Telehealth: Payer: 59 | Admitting: Orthopedic Surgery

## 2023-07-06 ENCOUNTER — Ambulatory Visit: Payer: Medicare Other | Admitting: Dermatology

## 2023-07-08 ENCOUNTER — Ambulatory Visit (INDEPENDENT_AMBULATORY_CARE_PROVIDER_SITE_OTHER): Payer: 59

## 2023-07-08 ENCOUNTER — Other Ambulatory Visit (INDEPENDENT_AMBULATORY_CARE_PROVIDER_SITE_OTHER): Payer: 59

## 2023-07-08 DIAGNOSIS — R7989 Other specified abnormal findings of blood chemistry: Secondary | ICD-10-CM

## 2023-07-08 DIAGNOSIS — E2740 Unspecified adrenocortical insufficiency: Secondary | ICD-10-CM | POA: Diagnosis not present

## 2023-07-08 LAB — COMPREHENSIVE METABOLIC PANEL
ALT: 9 U/L (ref 0–35)
AST: 16 U/L (ref 0–37)
Albumin: 4.1 g/dL (ref 3.5–5.2)
Alkaline Phosphatase: 105 U/L (ref 39–117)
BUN: 10 mg/dL (ref 6–23)
CO2: 28 meq/L (ref 19–32)
Calcium: 10.2 mg/dL (ref 8.4–10.5)
Chloride: 101 meq/L (ref 96–112)
Creatinine, Ser: 0.76 mg/dL (ref 0.40–1.20)
GFR: 86.39 mL/min (ref >60.00)
Glucose, Bld: 88 mg/dL (ref 70–99)
Potassium: 3.9 meq/L (ref 3.5–5.1)
Sodium: 141 meq/L (ref 135–145)
Total Bilirubin: 0.9 mg/dL (ref 0.2–1.2)
Total Protein: 7.5 g/dL (ref 6.0–8.3)

## 2023-07-08 LAB — CORTISOL
Cortisol, Plasma: 12.3 ug/dL
Cortisol, Plasma: 20.9 ug/dL
Cortisol, Plasma: 25.3 ug/dL

## 2023-07-08 MED ORDER — COSYNTROPIN 0.25 MG IJ SOLR
0.2500 mg | Freq: Once | INTRAMUSCULAR | Status: AC
Start: 2023-07-08 — End: 2023-07-08
  Administered 2023-07-08: 0.25 mg via INTRAMUSCULAR

## 2023-07-08 NOTE — Progress Notes (Signed)
Cosyntropin stimulation test is normal on 07/08/2023  No further follow-up from the endocrinology standpoint is needed

## 2023-07-15 NOTE — Progress Notes (Signed)
After obtaining consent, and per orders of Dr. Lonzo Cloud, injection of Cosyntropin 0.25 mg given by Pollie Meyer. Patient instructed to remain in clinic for the remainder of her labs.

## 2023-07-20 ENCOUNTER — Ambulatory Visit: Payer: 59 | Admitting: Dermatology

## 2023-07-30 ENCOUNTER — Other Ambulatory Visit: Payer: Self-pay | Admitting: Internal Medicine

## 2023-07-30 DIAGNOSIS — Z1231 Encounter for screening mammogram for malignant neoplasm of breast: Secondary | ICD-10-CM

## 2023-08-07 ENCOUNTER — Ambulatory Visit: Payer: 59

## 2023-08-11 ENCOUNTER — Other Ambulatory Visit: Payer: Self-pay | Admitting: Dermatology

## 2023-08-18 ENCOUNTER — Ambulatory Visit (INDEPENDENT_AMBULATORY_CARE_PROVIDER_SITE_OTHER): Payer: 59 | Admitting: Dermatology

## 2023-08-18 DIAGNOSIS — L299 Pruritus, unspecified: Secondary | ICD-10-CM | POA: Diagnosis not present

## 2023-08-18 DIAGNOSIS — R21 Rash and other nonspecific skin eruption: Secondary | ICD-10-CM

## 2023-08-18 MED ORDER — DOXYCYCLINE MONOHYDRATE 100 MG PO CAPS: 100.0000 mg | ORAL_CAPSULE | Freq: Two times a day (BID) | ORAL | 1 refills | Status: DC

## 2023-08-18 MED ORDER — PERMETHRIN 5 % EX CREA: TOPICAL_CREAM | CUTANEOUS | 1 refills | Status: AC

## 2023-08-18 NOTE — Progress Notes (Addendum)
   Follow Up Visit   Subjective  [name] is a 57 y.o. female who presents for the following: Rash on the face and body.  Rash is very itchy. Started in 2019 on face. Patient was seen in July and she was prescribed Elimite cream, but she was not able to do treatments due to no water. She has used clindamycin lotion, mometasone cream, and mupirocin ointment. She previously was taking doxycycline. Husband may be a little itchy, but not severe. Patient upset and crying in room.  The following portions of the chart were reviewed this encounter and updated as appropriate: medications, allergies, medical history  Review of Systems:  No other skin or systemic complaints except as noted in HPI or Assessment and Plan.  Objective  Well appearing patient in no apparent distress; mood and affect are within normal limits.  A focused examination was performed of the following areas: Face, trunk, extremities  Relevant exam findings are noted in the Assessment and Plan.    Assessment & Plan   Rash and nonspecific skin eruption  Pruritus    RASH with Pruritus Possible Scabies  Exam: Multiple pink and violaceous excoriated papules on shoulders, arms, legs; violaceous macules and pink excoriated papules on hands/fingers/wrists; large angular excoriations on the face.   Chronic and persistent condition with duration or expected duration over one year. Condition is bothersome/symptomatic for patient. Currently flared.    Treatment Plan: Start Elimite Cream apply from neck to toes at bedtimes leave on wash off in the morning, repeat in 1 week. Patient has at home.  Will send in a refill of Elimite Cream for patient's husband to treat with. After treatment, wash bed sheets/blankets in hot water and dry on high; vacuum around bed.  Discussed highly contagious condition.  All household members/close contacts should be treated at same time.  After treatment with topical Elimite cream applied  as directed overnight (and/or oral ivermectin), wash all bed sheets, towels, and recently worn clothing in hot water, and dry on high heat.  Repeat entire process in one week.   Restart Clindamycin lotion to areas on face once to twice daily.  Continue mometasone cream once to twice daily to itchy areas on body. Avoid applying to face, groin, and axilla. Use as directed. Long-term use can cause thinning of the skin. Continue mupirocin 2% ointment twice daily to open areas.  Restart doxycycline 100 MG take 1 po BID with food dsp #60 1Rf. Doxycycline should be taken with food to prevent nausea. Do not lay down for 30 minutes after taking. Be cautious with sun exposure and use good sun protection while on this medication. Pregnant women should not take this medication.   Patient is taking oxycodone, which can cause itching. Pt states Adderall is taken as needed only, not daily.   Return in about 2 weeks (around 09/01/2023) for r/u rash.  ICherlyn Labella, CMA, am acting as scribe for Willeen Niece, MD .   Documentation: I have reviewed the above documentation for accuracy and completeness, and I agree with the above.  Willeen Niece, MD

## 2023-08-18 NOTE — Patient Instructions (Addendum)
Start Elimite Cream apply from neck to toes at bedtimes leave on and wash off in the morning, repeat in 1 week. Husband to treat with Elimite cream at the same time.    After treatment, wash bed sheet in hot water and dry on high. After treatment, wash bed sheet in hot water and dry on high. Vacuum around bed.   Continue Clindamycin lotion to areas on face once to twice daily. Continue mupirocin 2% ointment twice daily to open areas. Continue mometasone cream once to twice daily to itchy areas on body. Avoid applying to face, groin, and axilla. Use as directed. Long-term use can cause thinning of the skin. Restart doxycycline 100 MG take one by mouth twice daily with food. Doxycycline should be taken with food to prevent nausea. Do not lay down for 30 minutes after taking. Be cautious with sun exposure and use good sun protection while on this medication. Pregnant women should not take this medication.    Due to recent changes in healthcare laws, you may see results of your pathology and/or laboratory studies on MyChart before the doctors have had a chance to review them. We understand that in some cases there may be results that are confusing or concerning to you. Please understand that not all results are received at the same time and often the doctors may need to interpret multiple results in order to provide you with the best plan of care or course of treatment. Therefore, we ask that you please give [place] 2 business days to thoroughly review all your results before contacting the office for clarification. Should we see a critical lab result, you will be contacted sooner.   If You Need Anything After Your Visit  If you have any questions or concerns for your doctor, please call our main line at [phone] and press option 4 to reach your doctor's medical assistant. If no one answers, please leave a voicemail as directed and we will return your call as soon as possible. Messages left after 4 pm  will be answered the following business day.   You may also send [place] a message via MyChart. We typically respond to MyChart messages within 1-2 business days.  For prescription refills, please ask your pharmacy to contact our office. Our fax number is [phone].  If you have an urgent issue when the clinic is closed that cannot wait until the next business day, you can page your doctor at the number below.    Please note that while we do our best to be available for urgent issues outside of office hours, we are not available 24/7.   If you have an urgent issue and are unable to reach [place], you may choose to seek medical care at your doctor's office, retail clinic, urgent care center, or emergency room.  If you have a medical emergency, please immediately call 911 or go to the emergency department.  Pager Numbers  - Dr. Gwen Pounds: [phone]  - Dr. Roseanne Reno: [phone]  - Dr. Katrinka Blazing: [phone]   In the event of inclement weather, please call our main line at [phone] for an update on the status of any delays or closures.  Dermatology Medication Tips: Please keep the boxes that topical medications come in in order to help keep track of the instructions about where and how to use these. Pharmacies typically print the medication instructions only on the boxes and not directly on the medication tubes.   If your medication is too expensive, please contact our  office at [phone] option 4 or send [place] a message through MyChart.   We are unable to tell what your co-pay for medications will be in advance as this is different depending on your insurance coverage. However, we may be able to find a substitute medication at lower cost or fill out paperwork to get insurance to cover a needed medication.   If a prior authorization is required to get your medication covered by your insurance company, please allow [place] 1-2 business days to complete this process.  Drug prices often vary  depending on where the prescription is filled and some pharmacies may offer cheaper prices.  The website www.goodrx.com contains coupons for medications through different pharmacies. The prices here do not account for what the cost may be with help from insurance (it may be cheaper with your insurance), but the website can give you the price if you did not use any insurance.  - You can print the associated coupon and take it with your prescription to the pharmacy.  - You may also stop by our office during regular business hours and pick up a GoodRx coupon card.  - If you need your prescription sent electronically to a different pharmacy, notify our office through [facility] or by phone at [phone] option 4.     Si Usted Necesita Algo Despus de Su Visita  Tambin puede enviarnos un mensaje a travs de Clinical cytogeneticist. Por lo general respondemos a los mensajes de MyChart en el transcurso de 1 a 2 das hbiles.  Para renovar recetas, por favor pida a su farmacia que se ponga en contacto con nuestra oficina. Annie Sable de fax es [place] [phone].  Si tiene un asunto urgente cuando la clnica est cerrada y que no puede esperar hasta el siguiente da hbil, puede llamar/localizar a su doctor(a) al nmero que aparece a continuacin.   Por favor, tenga en cuenta que aunque hacemos todo lo posible para estar disponibles para asuntos urgentes fuera del horario de [place], no estamos disponibles las 24 horas del da, los 7 [address]  [address] de la [place].   Si tiene un problema urgente y no puede comunicarse con nosotros, puede optar por buscar atencin mdica  en el consultorio de su doctor(a), en una clnica privada, en un centro de atencin urgente o en una sala de emergencias.  Si tiene Engineer, drilling, por favor llame inmediatamente al 911 o vaya a la sala de emergencias.  Nmeros de bper  - Dr. Gwen Pounds: [phone]  - Dra. Roseanne Reno: [phone]  - Dr. Katrinka Blazing: [phone]   En caso de  inclemencias del tiempo, por favor llame a Lacy Duverney principal al [phone] para una actualizacin sobre el [place] de cualquier retraso o cierre.  Consejos para la medicacin en dermatologa: Por favor, guarde las cajas en las que vienen los medicamentos de uso tpico para ayudarle a seguir las instrucciones sobre dnde y cmo usarlos. Las farmacias generalmente imprimen las instrucciones del medicamento slo en las cajas y no directamente en los tubos del [place].   Si su medicamento es muy caro, por favor, pngase en contacto con Rolm Gala llamando al [phone] y presione la opcin 4 o envenos un mensaje a travs de Clinical cytogeneticist.   No podemos decirle cul ser su copago por los medicamentos por adelantado ya que esto es diferente dependiendo de la cobertura de su seguro. Sin embargo, es posible que podamos encontrar un medicamento sustituto a Audiological scientist un formulario para que el seguro cubra el medicamento  que se considera necesario.   Si se requiere una autorizacin previa para que su compaa de seguros [place] su medicamento, por favor permtanos de 1 a 2 das hbiles para completar [address]  Los precios de los medicamentos varan con frecuencia dependiendo del Environmental consultant de dnde se surte la receta y alguna farmacias pueden ofrecer precios ms baratos.  El sitio web www.goodrx.com tiene cupones para medicamentos de Health and safety inspector. Los precios aqu no tienen en cuenta lo que podra costar con la ayuda del seguro (puede ser ms barato con su seguro), pero el sitio web puede darle el precio si no utiliz Tourist information centre manager.  - Puede imprimir el cupn correspondiente y llevarlo con su receta a la farmacia.  - Tambin puede pasar por nuestra oficina durante el horario de atencin regular y Education officer, museum una tarjeta de cupones de GoodRx.  - Si necesita que su receta se enve electrnicamente a una farmacia diferente, informe a nuestra oficina a travs de MyChart de  o  por telfono llamando al [phone] y presione la opcin 4.

## 2023-08-31 ENCOUNTER — Ambulatory Visit: Payer: 59 | Admitting: Dermatology

## 2023-09-28 ENCOUNTER — Encounter: Payer: Self-pay | Admitting: Dermatology

## 2023-09-28 ENCOUNTER — Ambulatory Visit: Payer: 59 | Admitting: Dermatology

## 2023-09-28 DIAGNOSIS — L705 Acne excoriee des jeunes filles: Secondary | ICD-10-CM

## 2023-09-28 DIAGNOSIS — T07XXXA Unspecified multiple injuries, initial encounter: Secondary | ICD-10-CM

## 2023-09-28 DIAGNOSIS — R21 Rash and other nonspecific skin eruption: Secondary | ICD-10-CM

## 2023-09-28 DIAGNOSIS — T148XXA Other injury of unspecified body region, initial encounter: Secondary | ICD-10-CM

## 2023-09-28 MED ORDER — HYDROXYZINE HCL 25 MG PO TABS: ORAL_TABLET | ORAL | 0 refills | Status: AC

## 2023-09-28 MED ORDER — PIMECROLIMUS 1 % EX CREA
TOPICAL_CREAM | Freq: Two times a day (BID) | CUTANEOUS | 2 refills | Status: AC
Start: 2023-09-28 — End: ?

## 2023-09-28 MED ORDER — DOXYCYCLINE MONOHYDRATE 100 MG PO CAPS: 100.0000 mg | ORAL_CAPSULE | Freq: Two times a day (BID) | ORAL | 1 refills | Status: DC

## 2023-09-28 MED ORDER — CLINDAMYCIN PHOSPHATE 1 % EX LOTN: TOPICAL_LOTION | CUTANEOUS | 1 refills | Status: DC

## 2023-09-28 NOTE — Patient Instructions (Addendum)
Treatment Plan: Start pimecrolimus once daily to all affected areas at face and body. May increase to twice daily with flares. Continue doxycycline 100 mg once daily with food and increase to twice daily with flares. Continue clindamycin lotion once daily to affected areas.  Continue mometasone 1-2 times daily to more severe areas at body as needed for itching. Continue mupirocin daily to open sores as needed.  Start hydroxyzine 25 mg 1-2 tablets at bedtime as needed for itching. May cause drowsiness.   Recommend N-acetylcysteine (NAC) 600 mg supplement three times per day to help with picking.  Recommend [facility] Hypochlorous Spray (found in the wound care section) . The [facility] Hypochlorous Spray can be sprayed on daily and left on. If you are using clindamycin solution or lotion or another topical antibiotic to treat acne, using a hypochlorous product may help lower the risk of antibiotic resistant bacteria.   Doxycycline should be taken with food to prevent nausea. Do not lay down for 30 minutes after taking. Be cautious with sun exposure and use good sun protection while on this medication. Pregnant women should not take this medication.   Topical steroids (such as triamcinolone, fluocinolone, fluocinonide, mometasone, clobetasol, halobetasol, betamethasone, hydrocortisone) can cause thinning and lightening of the skin if they are used for too long in the same area. Your physician has selected the right strength medicine for your problem and area affected on the body. Please use your medication only as directed by your physician to prevent side effects.   Due to recent changes in healthcare laws, you may see results of your pathology and/or laboratory studies on MyChart before the doctors have had a chance to review them. We understand that in some cases there may be results that are confusing or concerning to you. Please understand that not all results are received at the same time and often  the doctors may need to interpret multiple results in order to provide you with the best plan of care or course of treatment. Therefore, we ask that you please give [place] 2 business days to thoroughly review all your results before contacting the office for clarification. Should we see a critical lab result, you will be contacted sooner.   If You Need Anything After Your Visit  If you have any questions or concerns for your doctor, please call our main line at [phone] and press option 4 to reach your doctor's medical assistant. If no one answers, please leave a voicemail as directed and we will return your call as soon as possible. Messages left after 4 pm will be answered the following business day.   You may also send [place] a message via MyChart. We typically respond to MyChart messages within 1-2 business days.  For prescription refills, please ask your pharmacy to contact our office. Our fax number is [phone].  If you have an urgent issue when the clinic is closed that cannot wait until the next business day, you can page your doctor at the number below.    Please note that while we do our best to be available for urgent issues outside of office hours, we are not available 24/7.   If you have an urgent issue and are unable to reach [place], you may choose to seek medical care at your doctor's office, retail clinic, urgent care center, or emergency room.  If you have a medical emergency, please immediately call 911 or go to the emergency department.  Pager Numbers  - Dr. Gwen Pounds: [phone]  -  Dr. Roseanne Reno: [phone]  - Dr. Katrinka Blazing: [phone]   In the event of inclement weather, please call our main line at [phone] for an update on the status of any delays or closures.  Dermatology Medication Tips: Please keep the boxes that topical medications come in in order to help keep track of the instructions about where and how to use these. Pharmacies typically print the medication  instructions only on the boxes and not directly on the medication tubes.   If your medication is too expensive, please contact our office at [phone] option 4 or send [place] a message through MyChart.   We are unable to tell what your co-pay for medications will be in advance as this is different depending on your insurance coverage. However, we may be able to find a substitute medication at lower cost or fill out paperwork to get insurance to cover a needed medication.   If a prior authorization is required to get your medication covered by your insurance company, please allow [place] 1-2 business days to complete this process.  Drug prices often vary depending on where the prescription is filled and some pharmacies may offer cheaper prices.  The website www.goodrx.com contains coupons for medications through different pharmacies. The prices here do not account for what the cost may be with help from insurance (it may be cheaper with your insurance), but the website can give you the price if you did not use any insurance.  - You can print the associated coupon and take it with your prescription to the pharmacy.  - You may also stop by our office during regular business hours and pick up a GoodRx coupon card.  - If you need your prescription sent electronically to a different pharmacy, notify our office through [facility] or by phone at [phone] option 4.     Si Usted Necesita Algo Despus de Su Visita  Tambin puede enviarnos un mensaje a travs de Clinical cytogeneticist. Por lo general respondemos a los mensajes de MyChart en el transcurso de 1 a 2 das hbiles.  Para renovar recetas, por favor pida a su farmacia que se ponga en contacto con nuestra oficina. Annie Sable de fax es [place] [phone].  Si tiene un asunto urgente cuando la clnica est cerrada y que no puede esperar hasta el siguiente da hbil, puede llamar/localizar a su doctor(a) al nmero que aparece a continuacin.   Por  favor, tenga en cuenta que aunque hacemos todo lo posible para estar disponibles para asuntos urgentes fuera del horario de [place], no estamos disponibles las 24 horas del da, los 7 [address]  [address] de la [place].   Si tiene un problema urgente y no puede comunicarse con nosotros, puede optar por buscar atencin mdica  en el consultorio de su doctor(a), en una clnica privada, en un centro de atencin urgente o en una sala de emergencias.  Si tiene Engineer, drilling, por favor llame inmediatamente al 911 o vaya a la sala de emergencias.  Nmeros de bper  - Dr. Gwen Pounds: [phone]  - Dra. Roseanne Reno: [phone]  - Dr. Katrinka Blazing: [phone]   En caso de inclemencias del tiempo, por favor llame a Lacy Duverney principal al [phone] para una actualizacin sobre el [place] de cualquier retraso o cierre.  Consejos para la medicacin en dermatologa: Por favor, guarde las cajas en las que vienen los medicamentos de uso tpico para ayudarle a seguir las instrucciones sobre dnde y cmo usarlos. Las farmacias generalmente imprimen las instrucciones del medicamento slo en las  cajas y no directamente en los tubos del medicamento.   Si su medicamento es muy caro, por favor, pngase en contacto con Rolm Gala llamando al [phone] y presione la opcin 4 o envenos un mensaje a travs de Clinical cytogeneticist.   No podemos decirle cul ser su copago por los medicamentos por adelantado ya que esto es diferente dependiendo de la cobertura de su seguro. Sin embargo, es posible que podamos encontrar un medicamento sustituto a Audiological scientist un formulario para que el seguro cubra el medicamento que se considera necesario.   Si se requiere una autorizacin previa para que su compaa de seguros [place] su medicamento, por favor permtanos de 1 a 2 das hbiles para completar [address]  Los precios de los medicamentos varan con frecuencia dependiendo del Environmental consultant de dnde se surte la receta y alguna farmacias  pueden ofrecer precios ms baratos.  El sitio web www.goodrx.com tiene cupones para medicamentos de Health and safety inspector. Los precios aqu no tienen en cuenta lo que podra costar con la ayuda del seguro (puede ser ms barato con su seguro), pero el sitio web puede darle el precio si no utiliz Tourist information centre manager.  - Puede imprimir el cupn correspondiente y llevarlo con su receta a la farmacia.  - Tambin puede pasar por nuestra oficina durante el horario de atencin regular y Education officer, museum una tarjeta de cupones de GoodRx.  - Si necesita que su receta se enve electrnicamente a una farmacia diferente, informe a nuestra oficina a travs de MyChart de [place] o por telfono llamando al [phone] y presione la opcin 4.

## 2023-09-28 NOTE — Progress Notes (Signed)
   Follow-Up Visit   Subjective  [name] is a 57 y.o. female who presents for the following: rash follow up. Patient treated for scabies with Elimite cream twice, 1 week apart. She is using clindamycin lotion to face a few times daily as well as mupirocin and taking doxycycline. She was doing better until a few weeks ago.  Patient still with bumps on face, itching and picking. Body has improved. Husband is fine. Advises this has been going on for 5 years. Patient thinks it could be related to issues with her well and their water. No hx of eczema, asthma or allergies.  The patient has spots, moles and lesions to be evaluated, some may be new or changing and the patient may have concern these could be cancer.   The following portions of the chart were reviewed this encounter and updated as appropriate: medications, allergies, medical history  Review of Systems:  No other skin or systemic complaints except as noted in HPI or Assessment and Plan.  Objective  Well appearing patient in no apparent distress; mood and affect are within normal limits.   A focused examination was performed of the following areas: Face, lower legs, arms  Relevant exam findings are noted in the Assessment and Plan.    Assessment & Plan   Rash Exam: multiple angular excoriated erosions in multiple stages of healing at face, some with crusting Hands/arms with violaceous macules c/w healed excoriations  Chronic and persistent condition with duration or expected duration over one year. Condition is bothersome/symptomatic for patient. Currently flared.   Differential diagnosis:  Acne excoriee vrs neurotic excoriations, possible underlying dermatitis causing itching  Treatment Plan: Start pimecrolimus once daily to all affected areas at face and body. May increase to twice daily with flares. Continue doxycycline 100 mg once daily with food and increase to twice daily with flares. Continue clindamycin  lotion once daily to affected areas.  Continue mometasone 1-2 times daily to more severe areas at body as needed for itching. Continue mupirocin twice daily to open sores as needed.  Start hydroxyzine 25 mg 1-2 tablets at bedtime as needed for itching. May cause drowsiness.   Patient advised to stop scratching and picking and it is hard to tell what is causing the bumps/itching because of the excoriations. Patient seeing PCP 12/14 and will ask about medications for OCD. Some of her current medications may cause itching sensations, including oxycodone and adderall Recommend N-acetylcysteine (NAC) 600 mg supplement three times per day to help with picking  Recommend [facility] Hypochlorous Spray (found in the wound care section) . If you are using clindamycin solution or lotion or another topical antibiotic to treat acne, using a hypochlorous product may help lower the risk of antibiotic resistant bacteria.    Return for 4-6 wk rash .  Anise Salvo, RMA, am acting as scribe for Willeen Niece, MD .   Documentation: I have reviewed the above documentation for accuracy and completeness, and I agree with the above.  Willeen Niece, MD

## 2023-10-22 ENCOUNTER — Other Ambulatory Visit: Payer: Self-pay | Admitting: Dermatology

## 2023-11-02 ENCOUNTER — Ambulatory Visit: Payer: 59 | Admitting: Dermatology

## 2023-11-02 ENCOUNTER — Encounter: Payer: Self-pay | Admitting: Dermatology

## 2023-11-02 DIAGNOSIS — L700 Acne vulgaris: Secondary | ICD-10-CM | POA: Diagnosis not present

## 2023-11-02 DIAGNOSIS — R21 Rash and other nonspecific skin eruption: Secondary | ICD-10-CM

## 2023-11-02 DIAGNOSIS — L705 Acne excoriee des jeunes filles: Secondary | ICD-10-CM

## 2023-11-02 MED ORDER — TRETINOIN 0.05 % EX CREA: TOPICAL_CREAM | Freq: Every day | CUTANEOUS | 4 refills | Status: DC

## 2023-11-02 MED ORDER — DOXYCYCLINE MONOHYDRATE 100 MG PO CAPS: 100.0000 mg | ORAL_CAPSULE | Freq: Every day | ORAL | 3 refills | Status: DC

## 2023-11-02 NOTE — Progress Notes (Signed)
 Follow-Up Visit   Subjective  [name] is a 58 y.o. female who presents for the following: Acne excoriee vs neurotic excoriations possibly underlying dermatitis causing itching face, hands, arms 5wk f/u, Clindamycin  lotion qd, mometasone  cr bid, not using mupirocin  oint , Hydroxyzine  25mg  prn, Doxycycline  100mg  1 po qd, pt thinks some improvement, hands itchy, sometimes areas on face will be tender  The following portions of the chart were reviewed this encounter and updated as appropriate: medications, allergies, medical history  Review of Systems:  No other skin or systemic complaints except as noted in HPI or Assessment and Plan.  Objective  Well appearing patient in no apparent distress; mood and affect are within normal limits.   A focused examination was performed of the following areas: Face, hands, arms  Relevant exam findings are noted in the Assessment and Plan.    Assessment & Plan   Rash Exam: pink macules forehead, glabella, upper lip, chin, few inflammatory paps chin  Chronic and persistent condition with duration or expected duration over one year. Condition is improving with treatment but not currently at goal.   Differential diagnosis:  Acne excoriee vs Neurotic excoriations possibly underlying dermatitis  Treatment Plan: Start pimecrolimus  one to two times a day to all affected areas at face and body.  Continue doxycycline  100 mg once daily with food and drink Continue Clindamycin  lotion once daily to affected areas face  Continue Mometasone  1-2 times daily to more severe areas arms, hands as needed for itching. Restart Mupirocin  ointment twice daily to open sores as needed.  Cont Hydroxyzine  25 mg 1-2 tablets at bedtime as needed for itching. May cause drowsiness  Continue to avoid picking areas face, hands, arms  Topical steroids (such as triamcinolone, fluocinolone, fluocinonide, mometasone , clobetasol, halobetasol, betamethasone,  hydrocortisone ) can cause thinning and lightening of the skin if they are used for too long in the same area. Your physician has selected the right strength medicine for your problem and area affected on the body. Please use your medication only as directed by your physician to prevent side effects.    Doxycycline  should be taken with food to prevent nausea. Do not lay down for 30 minutes after taking. Be cautious with sun exposure and use good sun protection while on this medication. Pregnant women should not take this medication.     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 Condition is symptomatic/ bothersome to patient. Not currently at goal.   Treatment Plan: Start Tretinoin  0.05% cr qhs as tolerated to chin  Topical retinoid medications like tretinoin /Retin-A , adapalene/Differin, tazarotene/Fabior, and Epiduo/Epiduo Forte can cause dryness and irritation when first started. Only apply a pea-sized amount to the entire affected area. Avoid applying it around the eyes, edges of mouth and creases at the nose. If you experience irritation, use a good moisturizer first and/or apply the medicine less often. If you are doing well with the medicine, you can increase how often you use it until you are applying every night. Be careful with sun protection while using this medication as it can make you sensitive to the sun. This medicine should not be used by pregnant women.      Return in about 3 months (around 01/31/2024) for rash f/u, acne f/u.  I, Grayce Saunas, RMA, am acting as scribe for Rexene Rattler, MD .   Documentation: I have reviewed the above documentation for accuracy and completeness, and  I agree with the above.  Rexene Rattler, MD

## 2023-11-02 NOTE — Patient Instructions (Addendum)
 Mupirocin  ointment to open sores Pimecrolimus  (elidel ) to areas on face and body for itching  Start pimecrolimus  one to two times a day to all affected areas at face and body.  Continue doxycycline  100 mg once daily with food and drink Continue Clindamycin  lotion once daily to affected areas face  Continue Mometasone  1-2 times daily to more severe areas arms, hands as needed for itching. Restart Mupirocin  ointment twice daily to open sores as needed.  Cont Hydroxyzine  25 mg 1-2 tablets at bedtime as needed for itching. May cause drowsiness  Recommend sunscreen daily  Start Tretinoin  0.05% cream nightly to chin for acne, you only need a small amount  Can use sandwich method with moisturizer - apply moisturizer, then Tretinoin  0.05% cream, then moisturizer.   Due to recent changes in healthcare laws, you may see results of your pathology and/or laboratory studies on MyChart before the doctors have had a chance to review them. We understand that in some cases there may be results that are confusing or concerning to you. Please understand that not all results are received at the same time and often the doctors may need to interpret multiple results in order to provide you with the best plan of care or course of treatment. Therefore, we ask that you please give us  2 business days to thoroughly review all your results before contacting the office for clarification. Should we see a critical lab result, you will be contacted sooner.   If You Need Anything After Your Visit  If you have any questions or concerns for your doctor, please call our main line at [phone] and press option 4 to reach your doctor's medical assistant. If no one answers, please leave a voicemail as directed and we will return your call as soon as possible. Messages left after 4 pm will be answered the following business day.   You may also send us  a message via MyChart. We typically respond to MyChart messages within 1-2  business days.  For prescription refills, please ask your pharmacy to contact our office. Our fax number is [phone].  If you have an urgent issue when the clinic is closed that cannot wait until the next business day, you can page your doctor at the number below.    Please note that while we do our best to be available for urgent issues outside of office hours, we are not available 24/7.   If you have an urgent issue and are unable to reach us , you may choose to seek medical care at your doctor's office, retail clinic, urgent care center, or emergency room.  If you have a medical emergency, please immediately call 911 or go to the emergency department.  Pager Numbers  - Dr. Hester: [phone]  - Dr. Jackquline: [phone]  - Dr. Claudene: [phone]   In the event of inclement weather, please call our main line at [phone] for an update on the status of any delays or closures.  Dermatology Medication Tips: Please keep the boxes that topical medications come in in order to help keep track of the instructions about where and how to use these. Pharmacies typically print the medication instructions only on the boxes and not directly on the medication tubes.   If your medication is too expensive, please contact our office at [phone] option 4 or send us  a message through MyChart.   We are unable to tell what your co-pay for medications will be in advance as this is different depending on your insurance  coverage. However, we may be able to find a substitute medication at lower cost or fill out paperwork to get insurance to cover a needed medication.   If a prior authorization is required to get your medication covered by your insurance company, please allow us  1-2 business days to complete this process.  Drug prices often vary depending on where the prescription is filled and some pharmacies may offer cheaper prices.  The website www.goodrx.com contains coupons for  medications through different pharmacies. The prices here do not account for what the cost may be with help from insurance (it may be cheaper with your insurance), but the website can give you the price if you did not use any insurance.  - You can print the associated coupon and take it with your prescription to the pharmacy.  - You may also stop by our office during regular business hours and pick up a GoodRx coupon card.  - If you need your prescription sent electronically to a different pharmacy, notify our office through [facility] or by phone at [phone] option 4.     Si Usted Necesita Algo Despus de Su Visita  Tambin puede enviarnos un mensaje a travs de Clinical Cytogeneticist. Por lo general respondemos a los mensajes de MyChart en el transcurso de 1 a 2 das hbiles.  Para renovar recetas, por favor pida a su farmacia que se ponga en contacto con nuestra oficina. Randi lakes de fax es [place] [phone].  Si tiene un asunto urgente cuando la clnica est cerrada y que no puede esperar hasta el siguiente da hbil, puede llamar/localizar a su doctor(a) al nmero que aparece a continuacin.   Por favor, tenga en cuenta que aunque hacemos todo lo posible para estar disponibles para asuntos urgentes fuera del horario de [place], no estamos disponibles las 24 horas del da, los 7 [address]  [address] de la [place].   Si tiene un problema urgente y no puede comunicarse con nosotros, puede optar por buscar atencin mdica  en el consultorio de su doctor(a), en una clnica privada, en un centro de atencin urgente o en una sala de emergencias.  Si tiene engineer, drilling, por favor llame inmediatamente al 911 o vaya a la sala de emergencias.  Nmeros de bper  - Dr. Hester: [phone]  - Dra. Jackquline: [phone]  - Dr. Claudene: [phone]   En caso de inclemencias del tiempo, por favor llame a landry capes principal al [phone] para una actualizacin sobre el [place] de cualquier retraso  o cierre.  Consejos para la medicacin en dermatologa: Por favor, guarde las cajas en las que vienen los medicamentos de uso tpico para ayudarle a seguir las instrucciones sobre dnde y cmo usarlos. Las farmacias generalmente imprimen las instrucciones del medicamento slo en las cajas y no directamente en los tubos del [place].   Si su medicamento es muy caro, por favor, pngase en contacto con landry rieger llamando al [phone] y presione la opcin 4 o envenos un mensaje a travs de Clinical Cytogeneticist.   No podemos decirle cul ser su copago por los medicamentos por adelantado ya que esto es diferente dependiendo de la cobertura de su seguro. Sin embargo, es posible que podamos encontrar un medicamento sustituto a audiological scientist un formulario para que el seguro cubra el medicamento que se considera necesario.   Si se requiere una autorizacin previa para que su compaa de seguros cubra su medicamento, por favor permtanos de 1 a 2 das hbiles para completar este proceso.  Los  precios de los medicamentos varan con frecuencia dependiendo del lugar de dnde se surte la receta y alguna farmacias pueden ofrecer precios ms baratos.  El sitio web www.goodrx.com tiene cupones para medicamentos de health and safety inspector. Los precios aqu no tienen en cuenta lo que podra costar con la ayuda del seguro (puede ser ms barato con su seguro), pero el sitio web puede darle el precio si no utiliz tourist information centre manager.  - Puede imprimir el cupn correspondiente y llevarlo con su receta a la farmacia.  - Tambin puede pasar por nuestra oficina durante el horario de atencin regular y education officer, museum una tarjeta de cupones de GoodRx.  - Si necesita que su receta se enve electrnicamente a una farmacia diferente, informe a nuestra oficina a travs de MyChart de [place] o por telfono llamando al [phone] y presione la opcin 4.

## 2024-01-18 ENCOUNTER — Other Ambulatory Visit: Payer: Self-pay | Admitting: Dermatology

## 2024-01-31 ENCOUNTER — Ambulatory Visit: Payer: 59 | Admitting: Dermatology

## 2024-02-01 ENCOUNTER — Other Ambulatory Visit: Payer: Self-pay | Admitting: Dermatology

## 2024-03-29 ENCOUNTER — Other Ambulatory Visit: Payer: Self-pay | Admitting: Dermatology

## 2024-04-05 ENCOUNTER — Emergency Department

## 2024-04-05 ENCOUNTER — Other Ambulatory Visit: Payer: Self-pay

## 2024-04-05 ENCOUNTER — Encounter: Payer: Self-pay | Admitting: *Deleted

## 2024-04-05 ENCOUNTER — Emergency Department
Admission: EM | Admit: 2024-04-05 | Discharge: 2024-04-06 | Disposition: A | Discharge status: Home / Self Care | Attending: Emergency Medicine | Admitting: Emergency Medicine

## 2024-04-05 DIAGNOSIS — M546 Pain in thoracic spine: Secondary | ICD-10-CM | POA: Diagnosis present

## 2024-04-05 DIAGNOSIS — R110 Nausea: Secondary | ICD-10-CM | POA: Insufficient documentation

## 2024-04-05 DIAGNOSIS — R109 Unspecified abdominal pain: Secondary | ICD-10-CM | POA: Diagnosis not present

## 2024-04-05 DIAGNOSIS — M7989 Other specified soft tissue disorders: Secondary | ICD-10-CM | POA: Insufficient documentation

## 2024-04-05 DIAGNOSIS — R7401 Elevation of levels of liver transaminase levels: Secondary | ICD-10-CM | POA: Diagnosis not present

## 2024-04-05 DIAGNOSIS — K7010 Alcoholic hepatitis without ascites: Secondary | ICD-10-CM

## 2024-04-05 LAB — BASIC METABOLIC PANEL WITH GFR
Anion gap: 11 (ref 5–15)
BUN: 11 mg/dL (ref 6–20)
CO2: 24 mmol/L (ref 22–32)
Calcium: 8.7 mg/dL — ABNORMAL LOW (ref 8.9–10.3)
Chloride: 103 mmol/L (ref 98–111)
Creatinine, Ser: 0.86 mg/dL (ref 0.44–1.00)
GFR, Estimated: >60 mL/min (ref >60)
Glucose, Bld: 93 mg/dL (ref 70–99)
Potassium: 3.7 mmol/L (ref 3.5–5.1)
Sodium: 138 mmol/L (ref 135–145)

## 2024-04-05 LAB — CBC
HCT: 34.6 % — ABNORMAL LOW (ref 36.0–46.0)
Hemoglobin: 11.4 g/dL — ABNORMAL LOW (ref 12.0–15.0)
MCH: 30.2 pg (ref 26.0–34.0)
MCHC: 32.9 g/dL (ref 30.0–36.0)
MCV: 91.5 fL (ref 80.0–100.0)
Platelets: 106 10*3/uL — ABNORMAL LOW (ref 150–400)
RBC: 3.78 MIL/uL — ABNORMAL LOW (ref 3.87–5.11)
RDW: 13.6 % (ref 11.5–15.5)
WBC: 4.5 10*3/uL (ref 4.0–10.5)
nRBC: 0 % (ref 0.0–0.2)

## 2024-04-05 LAB — URINALYSIS, ROUTINE W REFLEX MICROSCOPIC
Bacteria, UA: NONE SEEN
Bilirubin Urine: NEGATIVE
Glucose, UA: NEGATIVE mg/dL
Ketones, ur: NEGATIVE mg/dL
Leukocytes,Ua: NEGATIVE
Nitrite: NEGATIVE
Protein, ur: NEGATIVE mg/dL
RBC / HPF: 0 RBC/hpf (ref 0–5)
Specific Gravity, Urine: 1.014 (ref 1.005–1.030)
pH: 5 (ref 5.0–8.0)

## 2024-04-05 LAB — HEPATIC FUNCTION PANEL
ALT: 104 U/L — ABNORMAL HIGH (ref 0–44)
AST: 209 U/L — ABNORMAL HIGH (ref 15–41)
Albumin: 3 g/dL — ABNORMAL LOW (ref 3.5–5.0)
Alkaline Phosphatase: 285 U/L — ABNORMAL HIGH (ref 38–126)
Bilirubin, Direct: 0.4 mg/dL — ABNORMAL HIGH (ref 0.0–0.2)
Indirect Bilirubin: 1.1 mg/dL — ABNORMAL HIGH (ref 0.3–0.9)
Total Bilirubin: 1.5 mg/dL — ABNORMAL HIGH (ref 0.0–1.2)
Total Protein: 6.1 g/dL — ABNORMAL LOW (ref 6.5–8.1)

## 2024-04-05 LAB — LIPASE, BLOOD: Lipase: 49 U/L (ref 11–51)

## 2024-04-05 MED ORDER — ONDANSETRON 4 MG PO TBDP
4.0000 mg | ORAL_TABLET | Freq: Once | ORAL | Status: AC
  Administered 2024-04-05: 4 mg via ORAL
  Filled 2024-04-05: qty 1

## 2024-04-05 MED ORDER — HYDROCODONE-ACETAMINOPHEN 5-325 MG PO TABS
1.0000 | ORAL_TABLET | Freq: Once | ORAL | Status: AC
  Administered 2024-04-05: 1 via ORAL
  Filled 2024-04-05: qty 1

## 2024-04-05 NOTE — ED Triage Notes (Signed)
 Pt to triage via wheelchair.   Pt has left lower back pain.  Pt reports dark urine.  No injury to back.  Pt also reports swelling to both lower legs.  No chest pain or sob    pt alert  speech clear.

## 2024-04-05 NOTE — ED Provider Notes (Signed)
 [facility] Provider Note    Event Date/Time   First MD Initiated Contact with Patient 04/05/24 2223     (approximate)   History   Back Pain   HPI  [name] is a 58 year old female presenting to the emergency department for evaluation of back pain.  Patient reports that she has had some pain over her left mid back for the past couple days.  Has also noted dark urine.  Also reports nausea.  No injury.  Reports a history of sciatica on her right side, but says that this feels different it is higher up in her back.  Additionally reports that she has had intermittent episodes of lower leg swelling particularly when she stands for prolonged times, currently improved.  No chest pain or shortness of breath.      Physical Exam   Triage Vital Signs: ED Triage Vitals  Encounter Vitals Group     BP 04/05/24 1912 (!) 105/47     Systolic BP Percentile --      Diastolic BP Percentile --      Pulse Rate 04/05/24 1912 (!) 108     Resp 04/05/24 1912 20     Temp 04/05/24 1912 98.9 F (37.2 C)     Temp Source 04/05/24 1912 Oral     SpO2 04/05/24 1912 100 %     Weight 04/05/24 1919 130 lb (59 kg)     Height 04/05/24 1919 5' 1 (1.549 m)     Head Circumference --      Peak Flow --      Pain Score 04/05/24 1918 8     Pain Loc --      Pain Education --      Exclude from Growth Chart --     Most recent vital signs: Vitals:   04/05/24 1912  BP: (!) 105/47  Pulse: (!) 108  Resp: 20  Temp: 98.9 F (37.2 C)  SpO2: 100%     General: Awake, interactive  CV:  Regular rate, good peripheral perfusion.  Resp:  Unlabored respirations, lungs clear to auscultation Abd:  Nondistended, soft, no significant abdominal tenderness.  Does have some tenderness over her left mid back Neuro:  Symmetric facial movement, fluid speech   ED Results / Procedures / Treatments   Labs (all labs ordered are listed, but only abnormal results are displayed) Labs Reviewed   BASIC METABOLIC PANEL WITH GFR - Abnormal; Notable for the following components:      Result Value   Calcium 8.7 (*)    All other components within normal limits  CBC - Abnormal; Notable for the following components:   RBC 3.78 (*)    Hemoglobin 11.4 (*)    HCT 34.6 (*)    Platelets 106 (*)    All other components within normal limits  URINALYSIS, ROUTINE W REFLEX MICROSCOPIC - Abnormal; Notable for the following components:   Color, Urine YELLOW (*)    APPearance CLEAR (*)    Hgb urine dipstick SMALL (*)    All other components within normal limits  HEPATIC FUNCTION PANEL - Abnormal; Notable for the following components:   Total Protein 6.1 (*)    Albumin 3.0 (*)    AST 209 (*)    ALT 104 (*)    Alkaline Phosphatase 285 (*)    Total Bilirubin 1.5 (*)    Bilirubin, Direct 0.4 (*)    Indirect Bilirubin 1.1 (*)    All other components within normal  limits  LIPASE, BLOOD     EKG EKG independently reviewed and interpreted by myself demonstrates:    RADIOLOGY Imaging independently reviewed and interpreted by myself demonstrates:  CT abdomen pelvis without renal stone  Formal Radiology Read:  CT Renal Stone Study Result Date: 04/05/2024 CLINICAL DATA:  Abdominal/flank pain, stone suspected. Left lower back pain. Dark urine. EXAM: CT ABDOMEN AND PELVIS WITHOUT CONTRAST TECHNIQUE: Multidetector CT imaging of the abdomen and pelvis was performed following the standard protocol without IV contrast. RADIATION DOSE REDUCTION: This exam was performed according to the departmental dose-optimization program which includes automated exposure control, adjustment of the mA and/or kV according to patient size and/or use of iterative reconstruction technique. COMPARISON:  CT abdomen pelvis 07/13/2022 FINDINGS: Lower chest: No acute abnormality. Hepatobiliary: Unremarkable liver. Normal gallbladder. No biliary dilation. Pancreas: Unremarkable. Spleen: Unremarkable. Adrenals/Urinary Tract: Normal  adrenal glands. No urinary calculi or hydronephrosis. Bladder is unremarkable. Stomach/Bowel: Normal caliber large and small bowel. No bowel wall thickening. The appendix is not visualized.Stomach is within normal limits. Vascular/Lymphatic: No significant vascular findings are present. No enlarged abdominal or pelvic lymph nodes. Reproductive: Hysterectomy.  No adnexal mass. Other: Small volume free fluid in the pelvis. No free intraperitoneal air. Postoperative change about the right abdominal wall. Musculoskeletal: No acute fracture. Chronic L5 spondylolysis. Grade 1 anterolisthesis of L4. IMPRESSION: 1. No acute abnormality in the abdomen or pelvis. No urinary calculi or hydronephrosis. 2. Small volume free fluid in the pelvic cul-de-sac. Electronically Signed   By: Rozell Cornet M.D.   On: 04/05/2024 23:10    PROCEDURES:  Critical Care performed: No  Procedures   MEDICATIONS ORDERED IN ED: Medications  HYDROcodone -acetaminophen  (NORCO/VICODIN) 5-325 MG per tablet 1 tablet (1 tablet Oral Given 04/05/24 2247)  ondansetron  (ZOFRAN -ODT) disintegrating tablet 4 mg (4 mg Oral Given 04/05/24 2247)     IMPRESSION / MDM / ASSESSMENT AND PLAN / ED COURSE  I reviewed the triage vital signs and the nursing notes.  Differential diagnosis includes, but is not limited to, UTI, ureteral stone, musculoskeletal strain, dehydration, lower suspicion significant acute intra-abdominal process given reassuring abdominal exam  Patient's presentation is most consistent with acute presentation with potential threat to life or bodily function.  58 year old female presenting to the emergency department for evaluation of left flank pain.  Tachycardic on presentation.  Labs with  normal white blood cell count, stable anemia.  Urine without evidence of infection.  Normal lipase.  Hepatic function panel with multiple abnormalities including new transaminitis and slightly elevated bilirubin.  CT renal stone study was  obtained which was without evidence of stone or other acute finding.  However in the setting of her transaminitis, will obtain right upper quadrant ultrasound to further evaluate.  Signed out to oncoming physician at 2335 pending ultrasound and disposition.      FINAL CLINICAL IMPRESSION(S) / ED DIAGNOSES   Final diagnoses:  Left flank pain  Transaminitis     Rx / DC Orders   ED Discharge Orders     None        Note:  This document was prepared using Dragon voice recognition software and may include unintentional dictation errors.   Claria Crofts, MD 04/05/24 [phone]

## 2024-04-06 ENCOUNTER — Emergency Department

## 2024-04-06 MED ORDER — LIDOCAINE 5 % EX PTCH: 1.0000 | MEDICATED_PATCH | Freq: Two times a day (BID) | CUTANEOUS | 1 refills | Status: AC

## 2024-04-06 MED ORDER — LIDOCAINE 5 % EX PTCH: 1.0000 | MEDICATED_PATCH | Freq: Two times a day (BID) | CUTANEOUS | 1 refills | Status: DC

## 2024-04-06 MED ORDER — LIDOCAINE 5 % EX PTCH
1.0000 | MEDICATED_PATCH | CUTANEOUS | Status: DC
  Administered 2024-04-06: 1 via TRANSDERMAL
  Filled 2024-04-06: qty 1

## 2024-04-06 NOTE — Discharge Instructions (Addendum)
Please use lidocaine patches at your site of pain.  Apply 1 patch at a time, leave on for 12 hours, then remove for 12 hours.  12 hours on, 12 hours off.  Do not apply more than 1 patch at a time. 

## 2024-04-06 NOTE — ED Provider Notes (Signed)
 Patient received in signout from Dr. Synetta Eves pending RUQ ultrasound and reassessment.  Seen for atraumatic left-sided flank/thoracic back pain.  Noted to have significant transaminitis.  Unrevealing CT renal study.  Follow-up RUQ ultrasound without acute features or signs of gallstones, hepatobiliary obstruction.  She does report drinking probably way more than I should, referring to alcohol .  We discussed alcoholic hepatitis.  She is not currently interested in seeking help.  Underlying left flank pain may be of muscular etiology.  Suitable for outpatient management.  Discussed ED return precautions.   Arline Bennett, MD 04/06/24 [phone]

## 2024-05-23 ENCOUNTER — Other Ambulatory Visit: Payer: Self-pay | Admitting: Dermatology

## 2024-07-07 ENCOUNTER — Other Ambulatory Visit: Payer: Self-pay | Admitting: Internal Medicine

## 2024-07-07 DIAGNOSIS — Z1231 Encounter for screening mammogram for malignant neoplasm of breast: Secondary | ICD-10-CM

## 2024-07-10 ENCOUNTER — Other Ambulatory Visit: Payer: Self-pay

## 2024-07-10 ENCOUNTER — Encounter: Payer: Self-pay | Admitting: Emergency Medicine

## 2024-07-10 ENCOUNTER — Emergency Department

## 2024-07-10 ENCOUNTER — Emergency Department
Admission: EM | Admit: 2024-07-10 | Discharge: 2024-07-10 | Disposition: A | Discharge status: Home / Self Care | Attending: Emergency Medicine | Admitting: Emergency Medicine

## 2024-07-10 DIAGNOSIS — M545 Low back pain, unspecified: Secondary | ICD-10-CM | POA: Diagnosis present

## 2024-07-10 DIAGNOSIS — M546 Pain in thoracic spine: Secondary | ICD-10-CM | POA: Diagnosis not present

## 2024-07-10 DIAGNOSIS — R059 Cough, unspecified: Secondary | ICD-10-CM | POA: Diagnosis not present

## 2024-07-10 LAB — URINALYSIS, ROUTINE W REFLEX MICROSCOPIC
Bilirubin Urine: NEGATIVE
Glucose, UA: NEGATIVE mg/dL
Hgb urine dipstick: NEGATIVE
Ketones, ur: NEGATIVE mg/dL
Leukocytes,Ua: NEGATIVE
Nitrite: NEGATIVE
Protein, ur: NEGATIVE mg/dL
Specific Gravity, Urine: 1.018 (ref 1.005–1.030)
pH: 5 (ref 5.0–8.0)

## 2024-07-10 MED ORDER — ONDANSETRON 4 MG PO TBDP
4.0000 mg | ORAL_TABLET | Freq: Once | ORAL | Status: AC
  Administered 2024-07-10: 4 mg via ORAL
  Filled 2024-07-10: qty 1

## 2024-07-10 MED ORDER — CYCLOBENZAPRINE HCL 10 MG PO TABS
10.0000 mg | ORAL_TABLET | Freq: Once | ORAL | Status: AC
  Administered 2024-07-10: 10 mg via ORAL
  Filled 2024-07-10: qty 1

## 2024-07-10 NOTE — ED Triage Notes (Signed)
 Thoracic back pain x 1 week. Also productive cough. Denies SOB/ DOE.  Pain with inspiration, cough, movement.

## 2024-07-10 NOTE — Discharge Instructions (Addendum)
 You are a exam, x-ray, and urinalysis are negative at this time for pneumonia, pleurisy, or any acute infection.  Take your home meds as prescribed.  Follow-up with your primary provider for ongoing evaluation.

## 2024-07-10 NOTE — ED Provider Notes (Signed)
 [facility] Emergency Department Provider Note     Event Date/Time   First MD Initiated Contact with Patient 07/10/24 1944     (approximate)   History   Back Pain   HPI  [name] is a 58 y.o. female with a history of bipolar disorder, depression, anxiety, DDD, and ADHD, presenting endorsing 1 week of thoracolumbar back pain.  Patient also reports a mildly productive cough patient denies any frank chest pain, shortness of breath, or DOE.  She does endorse pain to the thoracolumbar region with movement as well as deep inspiration.  She denies any fevers, chills, or sweats.  Physical Exam   Triage Vital Signs: ED Triage Vitals  Encounter Vitals Group     BP 07/10/24 1800 (!) 152/70     Girls Systolic BP Percentile --      Girls Diastolic BP Percentile --      Boys Systolic BP Percentile --      Boys Diastolic BP Percentile --      Pulse Rate 07/10/24 1759 98     Resp 07/10/24 1759 16     Temp 07/10/24 1759 98.2 F (36.8 C)     Temp Source 07/10/24 1945 Oral     SpO2 07/10/24 1757 100 %     Weight 07/10/24 1800 130 lb 1.1 oz (59 kg)     Height --      Head Circumference --      Peak Flow --      Pain Score 07/10/24 1759 10     Pain Loc --      Pain Education --      Exclude from Growth Chart --     Most recent vital signs: Vitals:   07/10/24 1958 07/10/24 2106  BP:  130/66  Pulse:  91  Resp:  20  Temp: 98.8 F (37.1 C) 97.9 F (36.6 C)  SpO2:  100%    General Awake, no distress.  NAD HEENT NCAT. PERRL. EOMI. No rhinorrhea. Mucous membranes are moist.  CV:  Good peripheral perfusion.  RRR RESP:  Normal effort.  CTA ABD:  No distention.  Soft and nontender MSK:  Normal spinal alignment without midline tenderness, spasm, deformity, or step-off.  AROM of all extremities.  Mild tender palpation to the bilateral paraspinal musculature of the thoracolumbar region. SKIN:  No rashes noted  ED Results / Procedures / Treatments    Labs (all labs ordered are listed, but only abnormal results are displayed) Labs Reviewed  URINALYSIS, ROUTINE W REFLEX MICROSCOPIC - Abnormal; Notable for the following components:      Result Value   Color, Urine YELLOW (*)    APPearance CLEAR (*)    All other components within normal limits    EKG   RADIOLOGY  I personally viewed and evaluated these images as part of my medical decision making, as well as reviewing the written report by the radiologist.  ED Provider Interpretation: No acute findings  DG Chest 2 View Result Date: 07/10/2024 EXAM: 2 VIEW(S) XRAY OF THE CHEST 07/10/2024 06:12:17 PM COMPARISON: None available. CLINICAL HISTORY: Right thoracic pain, productive cough. Thoracic back pain x 1 week. Also productive cough. Denies SOB/ DOE. Pain with inspiration, cough, movement. FINDINGS: LUNGS AND PLEURA: No focal pulmonary opacity. No pulmonary edema. No pleural effusion. No pneumothorax. HEART AND MEDIASTINUM: No acute abnormality of the cardiac and mediastinal silhouettes. BONES AND SOFT TISSUES: No acute osseous abnormality. Mild dextrocurvature of thoracic spine. Partially  seen lower cervical spinal fixation hardware. IMPRESSION: 1. No acute cardiopulmonary pathology. Electronically signed by: Donnice Mania MD 07/10/2024 06:43 PM EDT RP Workstation: [id number]     PROCEDURES:  Critical Care performed: No  Procedures   MEDICATIONS ORDERED IN ED: Medications  ondansetron  (ZOFRAN -ODT) disintegrating tablet 4 mg (4 mg Oral Given 07/10/24 2105)  cyclobenzaprine  (FLEXERIL ) tablet 10 mg (10 mg Oral Given 07/10/24 2105)     IMPRESSION / MDM / ASSESSMENT AND PLAN / ED COURSE  I reviewed the triage vital signs and the nursing notes.                              Differential diagnosis includes, but is not limited to,  ACS, aortic dissection, pulmonary embolism, cardiac tamponade, pneumothorax, pneumonia, pericarditis, myocarditis, GI-related causes including  esophagitis/gastritis, and musculoskeletal chest wall pain.    Patient's presentation is most consistent with acute complicated illness / injury requiring diagnostic workup.  Patient's diagnosis is consistent with LBP. Patient presents with symptoms of MSK etiology. Low concern for cardiopulmonary etiology, as patient localizes pain to the bilateral thoracolumbar paraspinal musculature. Pain is reproducible with palpation.  Chest x-ray interpreted by me, shows no acute intrathoracic process.  No UA evidence of bacteriuria.  Vital signs stable without evidence of tach tachypnea or hypoxia.  Patient will be discharged home with instructions to take her chronic pain meds and muscle relaxants as previously prescribed. Patient is to follow up with her PCP as suggested, as needed or otherwise directed. Patient is given ED precautions to return to the ED for any worsening or new symptoms.   FINAL CLINICAL IMPRESSION(S) / ED DIAGNOSES   Final diagnoses:  Acute bilateral low back pain without sciatica     Rx / DC Orders   ED Discharge Orders     None        Note:  This document was prepared using Dragon voice recognition software and may include unintentional dictation errors.    Loyd Candida LULLA Aldona, PA-C 07/10/24 2248    Levander Slate, MD 07/10/24 [phone]

## 2024-07-25 ENCOUNTER — Ambulatory Visit

## 2024-08-03 ENCOUNTER — Ambulatory Visit

## 2024-10-03 ENCOUNTER — Other Ambulatory Visit: Payer: Self-pay | Admitting: Dermatology
